# Patient Record
Sex: Male | Born: 1950
Health system: Southern US, Community
[De-identification: ages and names within clinical notes are randomized; demographics above are authoritative.]

## PROBLEM LIST (undated history)

## (undated) DIAGNOSIS — F329 Major depressive disorder, single episode, unspecified: Secondary | ICD-10-CM

## (undated) DIAGNOSIS — K589 Irritable bowel syndrome without diarrhea: Secondary | ICD-10-CM

## (undated) DIAGNOSIS — Z87442 Personal history of urinary calculi: Secondary | ICD-10-CM

## (undated) DIAGNOSIS — C4490 Unspecified malignant neoplasm of skin, unspecified: Secondary | ICD-10-CM

## (undated) DIAGNOSIS — Z950 Presence of cardiac pacemaker: Secondary | ICD-10-CM

## (undated) DIAGNOSIS — R55 Syncope and collapse: Secondary | ICD-10-CM

## (undated) DIAGNOSIS — N200 Calculus of kidney: Secondary | ICD-10-CM

## (undated) DIAGNOSIS — I10 Essential (primary) hypertension: Secondary | ICD-10-CM

## (undated) DIAGNOSIS — E119 Type 2 diabetes mellitus without complications: Secondary | ICD-10-CM

## (undated) DIAGNOSIS — K219 Gastro-esophageal reflux disease without esophagitis: Secondary | ICD-10-CM

## (undated) DIAGNOSIS — I480 Paroxysmal atrial fibrillation: Secondary | ICD-10-CM

## (undated) DIAGNOSIS — F32A Depression, unspecified: Secondary | ICD-10-CM

## (undated) DIAGNOSIS — M109 Gout, unspecified: Secondary | ICD-10-CM

## (undated) DIAGNOSIS — I442 Atrioventricular block, complete: Secondary | ICD-10-CM

## (undated) DIAGNOSIS — Z95810 Presence of automatic (implantable) cardiac defibrillator: Secondary | ICD-10-CM

## (undated) DIAGNOSIS — F419 Anxiety disorder, unspecified: Secondary | ICD-10-CM

## (undated) DIAGNOSIS — I428 Other cardiomyopathies: Secondary | ICD-10-CM

## (undated) HISTORY — DX: Gastro-esophageal reflux disease without esophagitis: K21.9

## (undated) HISTORY — PX: UPPER ENDOSCOPY W/ ESOPHAGEAL MANOMETRY: SHX2604

## (undated) HISTORY — DX: Calculus of kidney: N20.0

## (undated) HISTORY — DX: Paroxysmal atrial fibrillation: I48.0

## (undated) HISTORY — PX: CARDIAC DEFIBRILLATOR PLACEMENT: SHX171

## (undated) HISTORY — DX: Syncope and collapse: R55

## (undated) HISTORY — DX: Essential (primary) hypertension: I10

## (undated) HISTORY — DX: Major depressive disorder, single episode, unspecified: F32.9

## (undated) HISTORY — DX: Presence of automatic (implantable) cardiac defibrillator: Z95.810

## (undated) HISTORY — DX: Unspecified malignant neoplasm of skin, unspecified: C44.90

## (undated) HISTORY — DX: Depression, unspecified: F32.A

## (undated) HISTORY — DX: Other cardiomyopathies: I42.8

## (undated) HISTORY — DX: Atrioventricular block, complete: I44.2

## (undated) HISTORY — DX: Irritable bowel syndrome, unspecified: K58.9

## (undated) HISTORY — PX: LITHOTRIPSY: SUR834

## (undated) HISTORY — DX: Gout, unspecified: M10.9

## (undated) HISTORY — DX: Anxiety disorder, unspecified: F41.9

---

## 2003-03-11 ENCOUNTER — Other Ambulatory Visit: Payer: Self-pay

## 2004-09-10 ENCOUNTER — Ambulatory Visit: Payer: Self-pay | Admitting: Internal Medicine

## 2004-09-10 ENCOUNTER — Observation Stay: Payer: Self-pay | Admitting: Cardiology

## 2004-09-11 ENCOUNTER — Observation Stay (HOSPITAL_COMMUNITY): Admission: EM | Admit: 2004-09-11 | Discharge: 2004-09-12 | Payer: Self-pay | Admitting: Internal Medicine

## 2004-09-11 ENCOUNTER — Ambulatory Visit: Payer: Self-pay | Admitting: Internal Medicine

## 2004-09-28 ENCOUNTER — Ambulatory Visit: Payer: Self-pay

## 2005-01-18 ENCOUNTER — Ambulatory Visit: Payer: Self-pay | Admitting: Internal Medicine

## 2005-05-17 ENCOUNTER — Ambulatory Visit: Payer: Self-pay | Admitting: Internal Medicine

## 2005-11-16 ENCOUNTER — Ambulatory Visit: Payer: Self-pay | Admitting: Internal Medicine

## 2006-02-15 ENCOUNTER — Ambulatory Visit: Payer: Self-pay | Admitting: Internal Medicine

## 2006-03-11 ENCOUNTER — Ambulatory Visit: Payer: Self-pay

## 2006-05-17 ENCOUNTER — Ambulatory Visit: Payer: Self-pay | Admitting: Internal Medicine

## 2006-08-16 ENCOUNTER — Ambulatory Visit: Payer: Self-pay | Admitting: Internal Medicine

## 2006-11-08 ENCOUNTER — Ambulatory Visit: Payer: Self-pay | Admitting: Internal Medicine

## 2007-02-14 ENCOUNTER — Ambulatory Visit: Payer: Self-pay | Admitting: Internal Medicine

## 2007-02-16 ENCOUNTER — Ambulatory Visit: Payer: Self-pay | Admitting: Internal Medicine

## 2007-05-16 ENCOUNTER — Ambulatory Visit: Payer: Self-pay | Admitting: Internal Medicine

## 2007-08-15 ENCOUNTER — Ambulatory Visit: Payer: Self-pay | Admitting: Internal Medicine

## 2007-10-26 ENCOUNTER — Ambulatory Visit: Payer: Self-pay | Admitting: Unknown Physician Specialty

## 2007-11-20 ENCOUNTER — Ambulatory Visit: Payer: Self-pay | Admitting: Internal Medicine

## 2008-06-11 ENCOUNTER — Encounter: Payer: Self-pay | Admitting: Internal Medicine

## 2008-06-17 ENCOUNTER — Ambulatory Visit: Payer: Self-pay | Admitting: Internal Medicine

## 2008-10-22 ENCOUNTER — Ambulatory Visit: Payer: Self-pay | Admitting: Internal Medicine

## 2008-10-23 ENCOUNTER — Encounter: Payer: Self-pay | Admitting: Internal Medicine

## 2008-10-31 ENCOUNTER — Encounter: Payer: Self-pay | Admitting: Internal Medicine

## 2009-01-27 ENCOUNTER — Ambulatory Visit: Payer: Self-pay | Admitting: Internal Medicine

## 2009-01-27 ENCOUNTER — Encounter: Payer: Self-pay | Admitting: Cardiology

## 2009-01-27 DIAGNOSIS — I429 Cardiomyopathy, unspecified: Secondary | ICD-10-CM | POA: Insufficient documentation

## 2009-01-27 DIAGNOSIS — G720 Drug-induced myopathy: Secondary | ICD-10-CM | POA: Insufficient documentation

## 2009-01-27 DIAGNOSIS — IMO0001 Reserved for inherently not codable concepts without codable children: Secondary | ICD-10-CM

## 2009-01-27 DIAGNOSIS — I428 Other cardiomyopathies: Secondary | ICD-10-CM

## 2009-01-27 DIAGNOSIS — I5022 Chronic systolic (congestive) heart failure: Secondary | ICD-10-CM

## 2009-01-27 DIAGNOSIS — M791 Myalgia, unspecified site: Secondary | ICD-10-CM | POA: Insufficient documentation

## 2009-02-05 LAB — CONVERTED CEMR LAB: Sed Rate: 2 mm/hr (ref 0–16)

## 2009-04-29 ENCOUNTER — Ambulatory Visit: Payer: Self-pay | Admitting: Internal Medicine

## 2009-05-13 ENCOUNTER — Encounter: Payer: Self-pay | Admitting: Internal Medicine

## 2009-07-29 ENCOUNTER — Ambulatory Visit: Payer: Self-pay | Admitting: Internal Medicine

## 2009-08-11 ENCOUNTER — Encounter: Payer: Self-pay | Admitting: Internal Medicine

## 2009-11-04 ENCOUNTER — Other Ambulatory Visit: Payer: Self-pay | Admitting: Internal Medicine

## 2009-11-04 ENCOUNTER — Ambulatory Visit: Payer: Self-pay | Admitting: Internal Medicine

## 2009-11-26 ENCOUNTER — Encounter: Payer: Self-pay | Admitting: Internal Medicine

## 2010-02-02 ENCOUNTER — Ambulatory Visit: Payer: Self-pay | Admitting: Internal Medicine

## 2010-02-02 DIAGNOSIS — R55 Syncope and collapse: Secondary | ICD-10-CM | POA: Insufficient documentation

## 2010-02-03 ENCOUNTER — Telehealth: Payer: Self-pay | Admitting: Internal Medicine

## 2010-02-10 ENCOUNTER — Telehealth (INDEPENDENT_AMBULATORY_CARE_PROVIDER_SITE_OTHER): Payer: Self-pay | Admitting: *Deleted

## 2010-02-11 ENCOUNTER — Ambulatory Visit: Payer: Self-pay | Admitting: Cardiovascular Disease

## 2010-02-11 ENCOUNTER — Encounter: Payer: Self-pay | Admitting: Cardiovascular Disease

## 2010-02-11 ENCOUNTER — Ambulatory Visit: Payer: Self-pay

## 2010-02-11 ENCOUNTER — Encounter (HOSPITAL_COMMUNITY)
Admission: RE | Admit: 2010-02-11 | Discharge: 2010-05-09 | Payer: Self-pay | Source: Home / Self Care | Attending: Internal Medicine | Admitting: Internal Medicine

## 2010-02-23 ENCOUNTER — Ambulatory Visit: Payer: Self-pay | Admitting: Internal Medicine

## 2010-02-24 LAB — CONVERTED CEMR LAB
BUN: 12 mg/dL (ref 6–23)
CO2: 25 meq/L (ref 19–32)
Calcium: 9.1 mg/dL (ref 8.4–10.5)
Chloride: 105 meq/L (ref 96–112)
Creatinine, Ser: 1 mg/dL (ref 0.40–1.50)
Glucose, Bld: 150 mg/dL — ABNORMAL HIGH (ref 70–99)
Potassium: 3.8 meq/L (ref 3.5–5.3)
Sodium: 140 meq/L (ref 135–145)

## 2010-03-09 ENCOUNTER — Ambulatory Visit (HOSPITAL_COMMUNITY): Admission: RE | Admit: 2010-03-09 | Discharge: 2010-03-09 | Payer: Self-pay | Admitting: Internal Medicine

## 2010-03-09 ENCOUNTER — Ambulatory Visit: Payer: Self-pay | Admitting: Internal Medicine

## 2010-04-21 ENCOUNTER — Ambulatory Visit: Payer: Self-pay | Admitting: Internal Medicine

## 2010-04-21 DIAGNOSIS — I442 Atrioventricular block, complete: Secondary | ICD-10-CM | POA: Insufficient documentation

## 2010-04-22 ENCOUNTER — Encounter (INDEPENDENT_AMBULATORY_CARE_PROVIDER_SITE_OTHER): Payer: Self-pay | Admitting: *Deleted

## 2010-04-23 ENCOUNTER — Encounter: Payer: Self-pay | Admitting: Internal Medicine

## 2010-04-27 ENCOUNTER — Telehealth: Payer: Self-pay | Admitting: Internal Medicine

## 2010-05-06 ENCOUNTER — Encounter (INDEPENDENT_AMBULATORY_CARE_PROVIDER_SITE_OTHER): Payer: Self-pay | Admitting: *Deleted

## 2010-05-06 ENCOUNTER — Telehealth: Payer: Self-pay | Admitting: Internal Medicine

## 2010-05-07 ENCOUNTER — Encounter: Payer: Self-pay | Admitting: Internal Medicine

## 2010-05-14 ENCOUNTER — Encounter: Payer: Self-pay | Admitting: Internal Medicine

## 2010-05-14 ENCOUNTER — Ambulatory Visit
Admission: RE | Admit: 2010-05-14 | Discharge: 2010-05-14 | Payer: Self-pay | Source: Home / Self Care | Attending: Internal Medicine | Admitting: Internal Medicine

## 2010-05-18 LAB — CONVERTED CEMR LAB
BUN: 18 mg/dL (ref 6–23)
Basophils Absolute: 0.1 10*3/uL (ref 0.0–0.1)
Basophils Relative: 1 % (ref 0–1)
CO2: 26 meq/L (ref 19–32)
Calcium: 9.4 mg/dL (ref 8.4–10.5)
Chloride: 105 meq/L (ref 96–112)
Creatinine, Ser: 1.02 mg/dL (ref 0.40–1.50)
Eosinophils Absolute: 0.2 10*3/uL (ref 0.0–0.7)
Eosinophils Relative: 2 % (ref 0–5)
Glucose, Bld: 105 mg/dL — ABNORMAL HIGH (ref 70–99)
HCT: 40.7 % (ref 39.0–52.0)
Hemoglobin: 14.6 g/dL (ref 13.0–17.0)
INR: 1.07 (ref <1.50)
Lymphocytes Relative: 33 % (ref 12–46)
Lymphs Abs: 2.5 10*3/uL (ref 0.7–4.0)
MCHC: 35.9 g/dL (ref 30.0–36.0)
MCV: 87.2 fL (ref 78.0–100.0)
Monocytes Absolute: 0.8 10*3/uL (ref 0.1–1.0)
Monocytes Relative: 11 % (ref 3–12)
Neutro Abs: 4.1 10*3/uL (ref 1.7–7.7)
Neutrophils Relative %: 54 % (ref 43–77)
Platelets: 208 10*3/uL (ref 150–400)
Potassium: 4.7 meq/L (ref 3.5–5.3)
Prothrombin Time: 14.1 s (ref 11.6–15.2)
RBC: 4.67 M/uL (ref 4.22–5.81)
RDW: 13.4 % (ref 11.5–15.5)
Sodium: 143 meq/L (ref 135–145)
WBC: 7.6 10*3/uL (ref 4.0–10.5)
aPTT: 31 s

## 2010-05-20 ENCOUNTER — Ambulatory Visit (HOSPITAL_COMMUNITY)
Admission: RE | Admit: 2010-05-20 | Discharge: 2010-05-21 | Payer: Self-pay | Source: Home / Self Care | Attending: Internal Medicine | Admitting: Internal Medicine

## 2010-05-21 ENCOUNTER — Encounter: Payer: Self-pay | Admitting: Internal Medicine

## 2010-05-25 LAB — GLUCOSE, CAPILLARY
Glucose-Capillary: 121 mg/dL — ABNORMAL HIGH (ref 70–99)
Glucose-Capillary: 125 mg/dL — ABNORMAL HIGH (ref 70–99)
Glucose-Capillary: 135 mg/dL — ABNORMAL HIGH (ref 70–99)
Glucose-Capillary: 122 mg/dL — ABNORMAL HIGH (ref 70–99)
Glucose-Capillary: 149 mg/dL — ABNORMAL HIGH (ref 70–99)

## 2010-05-25 LAB — SURGICAL PCR SCREEN
MRSA, PCR: NEGATIVE
Staphylococcus aureus: NEGATIVE

## 2010-05-26 ENCOUNTER — Telehealth: Payer: Self-pay | Admitting: Internal Medicine

## 2010-05-28 NOTE — Op Note (Signed)
NAMESELVIN, Ryan [name]                 ACCOUNT NO.:  [id number]  MEDICAL RECORD NO.:  [id number]          PATIENT TYPE:  OIB  LOCATION:  6531                         FACILITY:  [facility]  PHYSICIAN:  Duke Salvia, MD, FACCDATE OF BIRTH:  22-Apr-1951  DATE OF PROCEDURE:  05/20/2010 DATE OF DISCHARGE:                              OPERATIVE REPORT   PREOPERATIVE DIAGNOSIS:  Complete heart block 6949 lead previously implanted device now nearing elective replacement indicator.  POSTOPERATIVE DIAGNOSIS:  Complete heart block 6949 lead previously implanted device now nearing elective replacement indicator.  PROCEDURE:  Explantation of the previously implanted device, insertion of a new lead, pocket revision and intraoperative defibrillation threshold testing.  Following obtained informed consent and previously obtained electrogram demonstrated patency of the extrathoracic left subclavian vein. Lidocaine was infiltrated just caudal to the prior incision carried down to layer of the prepectoral fascia using sharp dissection electrocautery.  At this point, venous access was obtained without difficulty and 9-French sheath was placed which was passed to the Medtronic 6947 dual coil defibrillator lead, serial number [id number]. Under fluoroscopic guidance, it was moved to the right ventricular apex where the distal rate sense portion was apical to the previously implanted lead.  In this location the bipolar R-wave that was paced was 10-12 mV with a pace impedance of 622 with threshold of 0.6 at 0.5. Current threshold 0.8 mA.  There was no diaphragmatic pacing at 10 volts and current of injury was adequate.  This lead was secured to the prepectoral fascia.  At this point the previously implanted defibrillator was freed up.  Interrogation of the previously implanted atrial leads model 5076PG [id number] demonstrated an amplitude of 1.5 with a pace impedance of 420, threshold of 1 volt at 0.5 and with  these acceptable parameters recorded, the leads were attached to Medtronic D314TRG device, serial number [id number].  The previously implanted defibrillator lead was capped and placed posteriorly.  Interrogation of the leads through the new device demonstrated amplitude of 1.2 with pace impedance of 3, threshold 0.75 at 0.4 mA and a paced rhythm with impedance of 532 and threshold of 0.5 at 0.4 in the V.  High-voltage impedances were 50/60.  Defibrillation threshold testing was undertaken.  After total duration of about 10 seconds, a 15- joule shock was delivered through a measured resistance of 50 ohms terminating induced ventricular fibrillation and restoring sinus rhythm.  The patient tolerated the procedure without apparent complication.  The pocket was copiously irrigated with antibiotic containing saline solution.  Hemostasis was assured.  The pocket was revised cephalad to allow for housing of the larger device. I SHOULD NOTE THAT THE DEVICE IMPLANT WAS A CRT DEVICE WITH THE LV PORT PLUGGED.  The wound was then closed in three layers in normal fashion following irrigation with antibiotic-containing saline solution and hemostasis have been obtained and Surgicel having been placed at the cephalad aspect of the pocket.  The wound was closed in three layers in normal fashion.  The wound was washed, dried, and benzoin Steri-Strip dressing was applied.  Needle counts, sponge counts and instrument counts were correct at the  end of procedure according to staff.  The patient tolerated the procedure without apparent complication.     Duke Salvia, MD, [facility]     SCK/MEDQ  D:  05/20/2010  T:  05/21/2010  Job:  [id number]  Electronically Signed by Sherryl Manges MD [facility] on 05/28/2010 01:42:04 PM

## 2010-05-29 ENCOUNTER — Encounter: Payer: Self-pay | Admitting: Internal Medicine

## 2010-05-29 ENCOUNTER — Ambulatory Visit
Admission: RE | Admit: 2010-05-29 | Discharge: 2010-05-29 | Payer: Self-pay | Source: Home / Self Care | Attending: Internal Medicine | Admitting: Internal Medicine

## 2010-06-09 NOTE — Progress Notes (Signed)
Summary: Venogram  Phone Note Outgoing Call   Call placed by: Benedict Needy, RN,  February 03, 2010 11:04 AM Call placed to: Patient Summary of Call: Called pt to inform him of his Venogram appt 10/21 pt needs to arrive at 2pm for appt at4pm. Needs to be NPO and have BMP within 7 days.  Unable to do myoviews on fridays and unsure if the pt would want to be NPO all day.  Initial call taken by: Benedict Needy, RN,  February 03, 2010 11:06 AM  Follow-up for Phone Call        Pt would like to have stress test a different day than the venogram. Venogram scheduled for 10/21 at 4pm @ [facility]. Benedict Needy, RN  February 03, 2010 3:33 PM   Pt's myoview scheduled for 10/5 at 9am. [facility] TCB Benedict Needy, RN  February 05, 2010 9:37 AM  Pt is aware of his appts for venogram and stress.   Follow-up by: Benedict Needy, RN,  February 05, 2010 3:23 PM  Additional Follow-up for Phone Call Additional follow up Details #1::        does not ned to be npo after stress for venogram

## 2010-06-09 NOTE — Procedures (Signed)
Summary: Cardiology Device Clinic   Allergies: No Known Drug Allergies   ICD Specifications Following MD:  Sherryl Manges, MD     ICD Vendor:  Medtronic     ICD Model Number:  D154ATG     ICD Serial Number:  [id number] ICD DOI:  09/11/2004     ICD Implanting MD:  Sherryl Manges, MD  Lead 1:    Location: RA     DOI: 09/11/2004     Model #: 4098     Serial #: [id number]     Status: active Lead 2:    Location: RV     DOI: 09/11/2004     Model #: 6213     Serial #: [id number]     Status: active  Indications::  NICM, LBBB, SYNCOPE   ICD Follow Up Remote Check?  No Battery Voltage:  2.68 V     Charge Time:  10.07 seconds     Underlying rhythm:  dependent ICD Dependent:  Yes       ICD Device Measurements Atrium:  Amplitude: 1 mV, Impedance: 552 ohms, Threshold: 1.0 V at 0.4 msec Right Ventricle:  Impedance: 664 ohms, Threshold: 1.0 V at 0.4 msec  Episodes Coumadin:  No Shock:  0     ATP:  0     Nonsustained:  0      Brady Parameters Mode DDDR     Lower Rate Limit:  60     Upper Rate Limit 140 PAV 180     Sensed AV Delay:  150  Tachy Zones VF:  200     VT:  240     VT1:  158     Tech Comments:  Checked by industry.  6949 lead stable, SIC  0.  To be scheduled for lead revision. Altha Harm, LPN  February 04, 2010 4:05 PM

## 2010-06-09 NOTE — Letter (Signed)
Summary: Remote Device Check  [employer], Main Office  [address]   [place], [place] [address]   Phone: [phone]  Fax: [phone]     November 26, 2009 [name]: [id number]   [facility] [address] [place], [place]  [address]   Dear Mr. SOLEDAD,   Your remote transmission was recieved and reviewed by your physician.  All diagnostics were within normal limits for you.   ___X___Your next office visit is scheduled for:  02-05-2010 @ 930am. Please call our office to schedule an appointment.    Sincerely,  Vella Kohler

## 2010-06-09 NOTE — Assessment & Plan Note (Signed)
Summary: Cardiology Nuclear Testing  Nuclear Med Background Indications for Stress Test: Evaluation for Ischemia   History: Defibrillator  History Comments: NICM,PAF  Symptoms: Light-Headedness, Nausea, Rapid HR    Nuclear Pre-Procedure Cardiac Risk Factors: LBBB Caffeine/Decaff Intake: None NPO After: 7:00 PM Lungs: clear IV 0.9% NS with Angio Cath: 22g     IV Site: R Forearm IV Started by: Irean Hong, RN Chest Size (in) 44     Height (in): 73.5 Weight (lb): 220 BMI: 28.74 Tech Comments: Held coreg x 24 hrs.  Nuclear Med Study 1 or 2 day study:  1 day     Stress Test Type:  Adenosine Reading MD:  Kristeen Miss, MD     Referring MD:  S.Klein Resting Radionuclide:  Technetium 47m Tetrofosmin     Resting Radionuclide Dose:  10.9 mCi  Stress Radionuclide:  Technetium 21m Tetrofosmin     Stress Radionuclide Dose:  32.9 mCi   Stress Protocol  Dose of Adenosine:  56.0 mg    Stress Test Technologist:  Milana Na, EMT-P     Nuclear Technologist:  Doyne Keel, CNMT  Rest Procedure  Myocardial perfusion imaging was performed at rest 45 minutes following the intravenous administration of Technetium 74m Tetrofosmin.  Stress Procedure  The patient received IV adenosine at 140 mcg/kg/min for 4 minutes. There were no significant changes with infusion. Technetium 30m Tetrofosmin was injected at the 2 minute mark and quantitative spect images were obtained after a 45 minute delay.  QPS Raw Data Images:  Normal; no motion artifact; normal heart/lung ratio. Stress Images:  There is a large severe defect of the  inferior apex and entire inferior Archey.  The uptake in the remaining walls is fairly normal. Rest Images:  There is a large severe defect of the  inferior apex and entire inferior Durkin.  The uptake in the remaining walls is fairly normal. Subtraction (SDS):  There is a fixed inferior defect that is most consistent with a previous infarction of the entire inferior Priola and  inferior apex. Transient Ischemic Dilatation:  1.05  (Normal <1.22)  Lung/Heart Ratio:  .33  (Normal <0.45)  Quantitative Gated Spect Images QGS EDV:  166 ml QGS ESV:  99 ml QGS EF:  40 % QGS cine images:  The overall LV function is mildly-moderately depressed.  There is akinesis/ dyskinesis of the inferior apex and the mid/distal inferior Teem.  Findings Low risk nuclear study      Overall Impression  Exercise Capacity: Adenosine with no exercise. BP Response: Normal blood pressure response. Clinical Symptoms: MIld dyspnea ECG Impression: LBBB at baseline.  There were no ECG changes to suggest ischemia. Overall Impression: Low risk stress nuclear study. Overall Impression Comments:  There is evidence of a previous large inferior MI.  There is no evidence of ischemia.  The LV function is mildly-moderately depressed with and EF of 40%.  There is akinesis/dyskinesis of the mid and distal / apical inferior Hochmuth.  Appended Document: Cardiology Nuclear Testing Please advise what to tell pt. Preliminarily reviewed. Forwarded to MD desktop for review and signature   Appended Document: Cardiology Nuclear Testing pt aware of results.

## 2010-06-09 NOTE — Assessment & Plan Note (Signed)
Summary: F1Y/AMD   Visit Type:  Follow-up Primary Provider:  Marlou Starks, MD  CC:  c/o having a spell Sept. 1 and 2011 at 8:30 with rapid heart beats and nausea..  History of Present Illness:  Ryan [name] is here in followup for an ICD implanted for syncope in the setting of a nonischemic cardiomyopathy.  He is now device dependent. He has 6949 lead in place  He has no complaints of chest pain or shortness of breath. Excise tolerance remains quite good.  He had  a spell of lightheadedness in early Sept? afib like  Current Medications (verified): 1)  Carvedilol 25 Mg Tabs (Carvedilol) .... Take One Tablet By Mouth Twice A Day 2)  Allopurinol 300 Mg Tabs (Allopurinol) .Marland Kitchen.. 1 By Mouth Once Daily 3)  Nexium 40 Mg Cpdr (Esomeprazole Magnesium) .Marland Kitchen.. 1 By Mouth Once Daily 4)  Micardis 80 Mg Tabs (Telmisartan) .Marland Kitchen.. 1 By Mouth Once Daily 5)  Amlodipine Besylate 5 Mg Tabs (Amlodipine Besylate) .... Take One Tablet By Mouth Daily 6)  Aspirin 81 Mg Tbec (Aspirin) .... Take One Tablet By Mouth Daily 7)  L-Lysine Hcl 500 Mg Tabs (Lysine Hcl) .Marland Kitchen.. 1 By Mouth Once Daily 8)  Pravastatin Sodium 20 Mg Tabs (Pravastatin Sodium) .... One Tablet At Bedtime  Allergies (verified): No Known Drug Allergies  Past History:  Past Medical History: Last updated: 02/24/09  1. Nonischemic cardiomyopathy.   2. History of syncope.   3. Status post implantable cardioverter-defibrillator for the above.   4. Recurrent development of complete heart block.   5. 6949 lead.   6. Paroxysmal atrial fibrillation - short runs.   7. Thromboembolic risk factors notable for hypertension, left       ventricular dysfunction, and question diabetes.   Family History: Last updated: 2009/02/24  Mother died of cancer, she had diabetes. Father died of  cancer, he had diabetes. He has two brothers and one sister. No coronary  artery disease.  Social History: Last updated: 02/24/09  The patient lives in [place] with his  wife. 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 No tobacco products. He does drink alcohol modestly (3 beers a week).  Vital Signs:  Patient profile:   60 year old male Height:      73.5 inches Weight:      225 pounds BMI:     29.39 Pulse rate:   71 / minute BP sitting:   169 / 101  (left arm) Cuff size:   regular  Vitals Entered By: Bishop Dublin, CMA (February 02, 2010 9:29 AM)  Physical Exam  General:  The patient was alert and oriented in no acute distress. HEENT Normal.  Neck veins were flat, carotids were brisk.  Lungs were clear.  Heart sounds were regular without murmurs or gallops.  Abdomen was soft with active bowel sounds. There is no clubbing cyanosis or edema. Skin Warm and dry Chest Duva with out collaterals-superficial    ICD Specifications Following MD:  Sherryl Manges, MD     ICD Vendor:  Medtronic     ICD Model Number:  D154ATG     ICD Serial Number:  [id number] ICD DOI:  09/11/2004     ICD Implanting MD:  Sherryl Manges, MD  Lead 1:    Location: RA     DOI: 09/11/2004     Model #: 8657     Serial #: [id number]     Status: active Lead 2:    Location: RV  DOI: 09/11/2004     Model #: 4196     Serial #: [id number]     Status: active  Indications::  NICM, LBBB, SYNCOPE   Episodes Coumadin:  No  Brady Parameters Mode DDDR     Lower Rate Limit:  60     Upper Rate Limit 140 PAV 180     Sensed AV Delay:  150  Tachy Zones VF:  200     VT:  240     VT1:  158     Impression & Recommendations:  Problem # 1:  6949 LEAD (ICD-996.04) With device dependence  and the risk of lead fracture, would plan lead replacemnt.  If the ipsilateral vein is open, and iwll need clarfication by venography, will add a lead, o/w wil refer to dr GT for extraction and replacement,  Have reviewed risks and benefits including byt not limited to death adn infection.  In antipication will undertake stress myoview scanning  Problem # 2:  IMPLANTABLE  DEFIBRILLATOR MDT  DDD (ICD-V45.02) Device parameters and data were reviewed and no changes were made  Problem # 3:  CARDIOMYOPATHY, PRIMARY, DILATED (ICD-425.4) stable on current meds; may be a candidate for aldosterone antagonism will await results of EF His updated medication list for this problem includes:    Carvedilol 25 Mg Tabs (Carvedilol) .Marland Kitchen... Take one tablet by mouth twice a day    Micardis 80 Mg Tabs (Telmisartan) .Marland Kitchen... 1 by mouth once daily    Amlodipine Besylate 5 Mg Tabs (Amlodipine besylate) .Marland Kitchen... Take one tablet by mouth daily    Aspirin 81 Mg Tbec (Aspirin) .Marland Kitchen... Take one tablet by mouth daily  Problem # 4:  SYSTOLIC HEART FAILURE, CHRONIC (ICD-428.22) stable His updated medication list for this problem includes:    Carvedilol 25 Mg Tabs (Carvedilol) .Marland Kitchen... Take one tablet by mouth twice a day    Micardis 80 Mg Tabs (Telmisartan) .Marland Kitchen... 1 by mouth once daily    Amlodipine Besylate 5 Mg Tabs (Amlodipine besylate) .Marland Kitchen... Take one tablet by mouth daily    Aspirin 81 Mg Tbec (Aspirin) .Marland Kitchen... Take one tablet by mouth daily  Other Orders: Treadmill (Treadmill)  Appended Document: Orders Update    Clinical Lists Changes  Orders: Added new Referral order of Nuclear Stress Test (Nuc Stress Test) - Signed

## 2010-06-09 NOTE — Cardiovascular Report (Signed)
Summary: Office Visit Remote   Office Visit Remote   Imported By: Roderic Ovens 11/27/2009 11:03:43  _____________________________________________________________________  External Attachment:    Type:   Image     Comment:   External Document

## 2010-06-09 NOTE — Cardiovascular Report (Signed)
Summary: [facility] ICD Follow-Up Summary    ICD Follow-Up Summary   Imported By: Roderic Ovens 03/17/2010 13:09:49  _____________________________________________________________________  External Attachment:    Type:   Image     Comment:   External Document

## 2010-06-09 NOTE — Letter (Signed)
Summary: Remote Device Check  [employer], Main Office  [address]   [place], [place] [address]   Phone: [phone]  Fax: [phone]     May 13, 2009 [name]: [id number]   [facility] [address] [place], [place]  [address]   Dear Mr. THIELEN,   Your remote transmission was recieved and reviewed by your physician.  All diagnostics were within normal limits for you.  __X___Your next transmission is scheduled for:  July 29, 2009.  Please transmit at any time this day.  If you have a wireless device your transmission will be sent automatically.      Sincerely,  Proofreader

## 2010-06-09 NOTE — Letter (Signed)
Summary: Remote Device Check  [employer], Main Office  [address]   [place], [place] [address]   Phone: [phone]  Fax: [phone]     August 11, 2009 [name]: [id number]   [facility] [address] [place], [place]  [address]   Dear Mr. DELFAVERO,   Your remote transmission was recieved and reviewed by your physician.  All diagnostics were within normal limits for you.  __X___Your next transmission is scheduled for:   November 04, 2009.  Please transmit at any time this day.  If you have a wireless device your transmission will be sent automatically.      Sincerely,  Proofreader

## 2010-06-09 NOTE — Cardiovascular Report (Signed)
Summary: Office Visit Remote   Office Visit Remote   Imported By: Roderic Ovens 05/15/2009 12:42:34  _____________________________________________________________________  External Attachment:    Type:   Image     Comment:   External Document

## 2010-06-09 NOTE — Progress Notes (Signed)
Summary: nuc pre procedure  Phone Note Outgoing Call Call back at Home Phone [phone]   Call placed by: Cathlyn Parsons RN,  February 10, 2010 1:38 PM Call placed to: Patient Reason for Call: Confirm/change Appt Summary of Call: .nucmess     Nuclear Med Background Indications for Stress Test: Evaluation for Ischemia   History: Defibrillator  History Comments: NICM,PAF  Symptoms: Light-Headedness, Nausea, Rapid HR    Nuclear Pre-Procedure Cardiac Risk Factors: LBBB Height (in): 73.5

## 2010-06-09 NOTE — Cardiovascular Report (Signed)
Summary: Office Visit Remote   Office Visit Remote   Imported By: Roderic Ovens 08/15/2009 14:51:48  _____________________________________________________________________  External Attachment:    Type:   Image     Comment:   External Document

## 2010-06-11 NOTE — Progress Notes (Signed)
Summary: Activity Restriction  Phone Note Call from Patient Call back at Home Phone [phone]   Caller: Wife  Call For: Ryan [name] Summary of Call: Pt is confused about dates of restriction of activities. Initial call taken by: Harlon Flor,  May 26, 2010 12:33 PM  Follow-up for Phone Call        Notified patient to follow instructions that were given when left the hospital.  Told him not to do too much until after his wound check.  He read off all instructions to me from the hospital discharge.  Told him to just make sure he follows those instructions. Follow-up by: Bishop Dublin, CMA,  May 26, 2010 5:00 PM

## 2010-06-11 NOTE — Procedures (Signed)
Summary: 10 day wound ck/mt   Current Medications (verified): 1)  Carvedilol 25 Mg Tabs (Carvedilol) .... Take One Tablet By Mouth Twice A Day 2)  Allopurinol 300 Mg Tabs (Allopurinol) .Marland Kitchen.. 1 By Mouth Once Daily 3)  Nexium 40 Mg Cpdr (Esomeprazole Magnesium) .Marland Kitchen.. 1 By Mouth Once Daily 4)  Micardis 80 Mg Tabs (Telmisartan) .Marland Kitchen.. 1 By Mouth Once Daily 5)  Aspirin 81 Mg Tbec (Aspirin) .... Take One Tablet By Mouth Daily 6)  Pravastatin Sodium 20 Mg Tabs (Pravastatin Sodium) .... One Tablet At Bedtime 7)  Verelan 240 Mg Xr24h-Cap (Verapamil Hcl) .... One By Mouth Daily 8)  Vitamin D3 50000 Unit Caps (Cholecalciferol) .... One By Mouth Weekly  Allergies (verified): No Known Drug Allergies    ICD Specifications Following MD:  Sherryl Manges, MD     ICD Vendor:  Medtronic     ICD Model Number:  [id number]     ICD Serial Number:  [id number] ICD DOI:  05/20/2010     ICD Implanting MD:  Sherryl Manges, MD  Lead 1:    Location: RA     DOI: 09/11/2004     Model #: 0981     Serial #: [id number]     Status: active Lead 2:    Location: RV     DOI: 09/11/2004     Model #: 2130     Serial #: [id number]     Status: capped Lead 3:    Location: RV     DOI: 05/20/2010     Model #: 2952     Serial #: [id number]     Status: active  Indications::  NICM, LBBB, SYNCOPE  Explantation Comments: 05/20/10 Medtronic [id number] H explanted  ICD Follow Up Remote Check?  No Battery Voltage:  3.22 V     Charge Time:  8.2 seconds     Underlying rhythm:  dependent ICD Dependent:  Yes       ICD Device Measurements Atrium:  Amplitude: 0.4 mV, Impedance: 399 ohms, Threshold: 0.5 V at 0.4 msec Right Ventricle:  Impedance: 589 ohms, Threshold: 0.5 V at 0.4 msec Shock Impedance: 46/60 ohms   Episodes MS Episodes:  0     Percent Mode Switch:  0     Coumadin:  No Shock:  0     ATP:  0     Nonsustained:  0     Atrial Pacing:  41.2%     Ventricular Pacing:  100%  Brady Parameters Mode DDDR     Lower Rate Limit:  60      Upper Rate Limit 140 PAV 180     Sensed AV Delay:  150  Tachy Zones VF:  200     VT:  240     VT1:  158     Next Cardiology Appt Due:  08/09/2010 Tech Comments:  Lead impedance alert values reporgrammed.  Steri strips removed, no redness or edema noted.  Device function normal.  ROV 3 months with Dr. Graciela Husbands in [place]. Altha Harm, LPN  May 29, 2010 2:19 PM

## 2010-06-11 NOTE — Progress Notes (Signed)
  Phone Note Outgoing Call   Call placed by: rhonda Call placed to: Patient Details for Reason: reschedule surgery time Summary of Call: spoke w/pt and adv that new surgery time is 1:30 and that I will send a revised letter to him. Pt expressed understanding.  Initial call taken by: Claris Gladden RN,  May 06, 2010 12:23 PM

## 2010-06-11 NOTE — Cardiovascular Report (Signed)
Summary: Office Visit   Office Visit   Imported By: Roderic Ovens 04/23/2010 11:48:05  _____________________________________________________________________  External Attachment:    Type:   Image     Comment:   External Document

## 2010-06-11 NOTE — Assessment & Plan Note (Signed)
Summary: eph/ gd   Visit Type:  Consult Primary Alejandro Gamel:  Marlou Starks, MD  CC:  no complaints.  History of Present Illness:  Ryan [name] is here in followup for an ICD implanted for syncope in the setting of a nonischemic cardiomyopathy.  He is now device dependent. He has 6949 lead in place  He has no complaints of chest pain or shortness of breath. Excise tolerance remains quite good.  He had  a spell of lightheadedness in early Sept? afib like  Problems Prior to Update: 1)  Syncope  (ICD-780.2) 2)  Myalgia  (ICD-729.1) 3)  Cardiomyopathy, Primary, Dilated  (ICD-425.4) 4)  Implantable Defibrillator Mdt Ddd  (ICD-V45.02) 5)  6949 Lead  (ICD-996.04) 6)  Systolic Heart Failure, Chronic  (ICD-428.22)  Current Problems (verified): 1)  Syncope  (ICD-780.2) 2)  Myalgia  (ICD-729.1) 3)  Cardiomyopathy, Primary, Dilated  (ICD-425.4) 4)  Implantable Defibrillator Mdt Ddd  (ICD-V45.02) 5)  6949 Lead  (ICD-996.04) 6)  Systolic Heart Failure, Chronic  (ICD-428.22)  Current Medications (verified): 1)  Carvedilol 25 Mg Tabs (Carvedilol) .... Take One Tablet By Mouth Twice A Day 2)  Allopurinol 300 Mg Tabs (Allopurinol) .Marland Kitchen.. 1 By Mouth Once Daily 3)  Nexium 40 Mg Cpdr (Esomeprazole Magnesium) .Marland Kitchen.. 1 By Mouth Once Daily 4)  Micardis 80 Mg Tabs (Telmisartan) .Marland Kitchen.. 1 By Mouth Once Daily 5)  Amlodipine Besylate 5 Mg Tabs (Amlodipine Besylate) .... Take One Tablet By Mouth Daily 6)  Aspirin 81 Mg Tbec (Aspirin) .... Take One Tablet By Mouth Daily 7)  Pravastatin Sodium 20 Mg Tabs (Pravastatin Sodium) .... One Tablet At Bedtime  Allergies (verified): No Known Drug Allergies  Past History:  Past Medical History: Last updated: 02/04/09  1. Nonischemic cardiomyopathy.   2. History of syncope.   3. Status post implantable cardioverter-defibrillator for the above.   4. Recurrent development of complete heart block.   5. 6949 lead.   6. Paroxysmal atrial fibrillation - short runs.   7.  Thromboembolic risk factors notable for hypertension, left       ventricular dysfunction, and question diabetes.   Family History: Last updated: Feb 04, 2009  Mother died of cancer, she had diabetes. Father died of  cancer, he had diabetes. He has two brothers and one sister. No coronary  artery disease.  Social History: Last updated: 04-Feb-2009  The patient lives in [place] with his wife. 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 No tobacco products. He does drink alcohol modestly (3 beers a week).  Vital Signs:  Patient profile:   60 year old male Height:      73.5 inches Weight:      222.50 pounds BMI:     29.06 Pulse rate:   68 / minute BP sitting:   159 / 94  (left arm) Cuff size:   regular  Vitals Entered By: Caralee Ates CMA (April 21, 2010 4:56 PM)  Physical Exam  General:  The patient was alert and oriented in no acute distress. HEENT Normal.  Neck veins were flat, carotids were brisk.  Lungs were clear.  Heart sounds were regular without murmurs or gallops.  Abdomen was soft with active bowel sounds. There is no clubbing cyanosis or edema. Skin Warm and dry     ICD Specifications Following MD:  Sherryl Manges, MD     ICD Vendor:  Medtronic     ICD Model Number:  D154ATG     ICD Serial Number:  [id number] H ICD  DOI:  09/11/2004     ICD Implanting MD:  Sherryl Manges, MD  Lead 1:    Location: RA     DOI: 09/11/2004     Model #: 1610     Serial #: [id number]     Status: active Lead 2:    Location: RV     DOI: 09/11/2004     Model #: 1914     Serial #: [id number]     Status: active  Indications::  NICM, LBBB, SYNCOPE   ICD Follow Up ICD Dependent:  Yes      Episodes Coumadin:  No  Brady Parameters Mode DDDR     Lower Rate Limit:  60     Upper Rate Limit 140 PAV 180     Sensed AV Delay:  150  Tachy Zones VF:  200     VT:  240     VT1:  158     Impression & Recommendations:  Problem # 1:  IMPLANTABLE  DEFIBRILLATOR MDT DDD  (ICD-V45.02) Device parameters and data were reviewed and no changes were made  device is approaching ERI. He is however device dependent. With a 6949-lead and the risk of fracture, we have decided to pursue generator replacement and lead revision sooner rather than later. He is aware of the potential risks including but not limited to infection. Because his device dependence, we would anticipate using antimicrobial patch.  Problem # 2:  [id number] LEAD (ICD-996.04) as above  Problem # 3:  SYSTOLIC HEART FAILURE, CHRONIC (ICD-428.22) stable on current meds His updated medication list for this problem includes:    Carvedilol 25 Mg Tabs (Carvedilol) .Marland Kitchen... Take one tablet by mouth twice a day    Micardis 80 Mg Tabs (Telmisartan) .Marland Kitchen... 1 by mouth once daily    Amlodipine Besylate 5 Mg Tabs (Amlodipine besylate) .Marland Kitchen... Take one tablet by mouth daily    Aspirin 81 Mg Tbec (Aspirin) .Marland Kitchen... Take one tablet by mouth daily  Problem # 4:  CARDIOMYOPATHY, PRIMARY, DILATED (ICD-425.4) stable on current meds His updated medication list for this problem includes:    Carvedilol 25 Mg Tabs (Carvedilol) .Marland Kitchen... Take one tablet by mouth twice a day    Micardis 80 Mg Tabs (Telmisartan) .Marland Kitchen... 1 by mouth once daily    Amlodipine Besylate 5 Mg Tabs (Amlodipine besylate) .Marland Kitchen... Take one tablet by mouth daily    Aspirin 81 Mg Tbec (Aspirin) .Marland Kitchen... Take one tablet by mouth daily  Problem # 5:  AV BLOCK, COMPLETE (ICD-426.0) C. the above His updated medication list for this problem includes:    Carvedilol 25 Mg Tabs (Carvedilol) .Marland Kitchen... Take one tablet by mouth twice a day    Amlodipine Besylate 5 Mg Tabs (Amlodipine besylate) .Marland Kitchen... Take one tablet by mouth daily    Aspirin 81 Mg Tbec (Aspirin) .Marland Kitchen... Take one tablet by mouth daily

## 2010-06-11 NOTE — Letter (Signed)
Summary: Implantable Device Instructions  Architectural technologist, Main Office  [address]   [place], [place] [address]   Phone: [phone]  Fax: [phone]      Implantable Device Instructions  You are scheduled for:  __x___ Generator Change and Lead Revision  on May 20, 2010 at 11:00 am with Dr. Graciela Husbands.  1.  Please arrive at the Short Stay Center at [facility] at 9:00 am on the day of your procedure.  2.  Do not eat after midnight. You may have clear liquids until 4:00 am the morning of your procedure. Clear liquids include water, Sprite, Ginger Ale, cranberry/grape/apple juice, tea (no sugar), broth, and popsicles from clear juices.    3.  Complete lab work on May 14, 2010 10:00 in [place].  4.  Take morning medications with a few sips of water.   5.  Plan for an overnight stay.  Bring your insurance cards and a list of your medications.  6.  Wash your chest and neck with antibacterial soap (any brand) the evening before and the morning of your procedure.  Rinse well.   *If you have ANY questions after you get home, please call the office [phone].  Claris Gladden, RN  *Every attempt is made to prevent procedures from being rescheduled.  Due to the nauture of Electrophysiology, rescheduling can happen.  The physician is always aware and directs the staff when this occurs.

## 2010-06-11 NOTE — Progress Notes (Signed)
  Phone Note Call from Patient   Caller: Patient Summary of Call: Was seen by Dr. Graciela Husbands last wed.  The patient has several concerns regarding surgery for generator change and lead placement.  Would like to know who is going to do the procedure on May 20, 2009 because he received a letter in the mail stating Dr. Graciela Husbands would be doing procedure and he understood at the o.v. by Dr. Graciela Husbands that another physician would be doing the procedure.  He just wants to make sure he has all the information that is needed. [phone] or [phone] Initial call taken by: Bishop Dublin, CMA,  April 27, 2010 11:57 AM  Follow-up for Phone Call        pt not at work number and rna at home. 04/27/10 1613 spoke w/pt and he rqstd records to be sent to PCP-Dr. Dossie Arbour which I will do in am.  The main thing is back on 9/26 visit the comments under problem 1 lead-reflected the possibility of Dr. Ladona Ridgel doing the surgery.  Apparently a venogram and stress test have been done since and Dr. Graciela Husbands will do the surgery based on those results.   Will confirm that this is all correct and that nothing was overlooked. Adv pt that will f/u within next 5 business days.  Follow-up by: Claris Gladden RN,  April 27, 2010 6:35 PM     Appended Document:  ROI Signed, Faxed all Records over to [facility] @ [phone]

## 2010-06-11 NOTE — Miscellaneous (Signed)
Summary: Device change out  Clinical Lists Changes  Observations: Added new observation of ICDLEADSTAT3: active (05/21/2010 12:52) Added new observation of ICDLEADSER3: [id number] (05/21/2010 12:52) Added new observation of ICDLEADMOD3: 6947  (05/21/2010 12:52) Added new observation of ICDLEADDOI3: 05/20/2010  (05/21/2010 12:52) Added new observation of ICDLEADLOC3: RV  (05/21/2010 12:52) Added new observation of ICDLEADSTAT2: capped  (05/21/2010 12:52) Added new observation of ICD IMPL DTE: 05/20/2010  (05/21/2010 12:52) Added new observation of ICD SERL#: [id number]  (05/21/2010 12:52) Added new observation of ICD MODL#: D314TRG  (05/21/2010 12:52) Added new observation of ICDEXPLCOMM: 05/20/10 Medtronic [id number]/[id number] H explanted  (05/21/2010 12:52)       ICD Specifications Following MD:  Sherryl Manges, MD     ICD Vendor:  Medtronic     ICD Model Number:  D314TRG     ICD Serial Number:  [id number] ICD DOI:  05/20/2010     ICD Implanting MD:  Sherryl Manges, MD  Lead 1:    Location: RA     DOI: 09/11/2004     Model #: 6578     Serial #: [id number]     Status: active Lead 2:    Location: RV     DOI: 09/11/2004     Model #: 1324     Serial #: [id number]     Status: capped Lead 3:    Location: RV     DOI: 05/20/2010     Model #: 6644     Serial #: [id number]     Status: active  Indications::  NICM, LBBB, SYNCOPE  Explantation Comments: 05/20/10 Medtronic [id number] H explanted  ICD Follow Up ICD Dependent:  Yes      Episodes Coumadin:  No  Brady Parameters Mode DDDR     Lower Rate Limit:  60     Upper Rate Limit 140 PAV 180     Sensed AV Delay:  150  Tachy Zones VF:  200     VT:  240     VT1:  158

## 2010-06-11 NOTE — Letter (Signed)
Summary: Implantable Device Instructions  Architectural technologist, Main Office  [address]   [place], [place] [address]   Phone: [phone]  Fax: [phone]      Implantable Device Instructions Rev-1 May 06, 2010  You are scheduled for:   __x___ Generator Change and Lead Revision  on May 20, 2010 at 1:30 pm with Dr. Graciela Husbands.  1.  Please arrive at the Short Stay Center at [facility] at 11:30 am on the day of your procedure.  2.  You may have clear liquids until 5:30 am the morning of your procedure.  Clear liquids include water, Sprite, Ginger Ale, cranberry/grape/apple juice, coffee, tea (no sugar), broth, and popsicles from clear juices.   3.  Complete lab work on May 14, 2009 at 10:00 in [place]. You do not have to be fasting.  4.  Take morning meds with a few sips of water.   5.  Plan for an overnight stay.  Bring your insurance cards and a list of your medications.  6.  Wash your chest and neck with antibacterial soap (any brand) the evening before and the morning of your procedure.  Rinse well.    *If you have ANY questions after you get home, please call the office [phone].  Claris Gladden, RN  *Every attempt is made to prevent procedures from being rescheduled.  Due to the nauture of Electrophysiology, rescheduling can happen.  The physician is always aware and directs the staff when this occurs.

## 2010-06-17 NOTE — Cardiovascular Report (Signed)
Summary: Pre-Op Orders  Pre-Op Orders   Imported By: Marylou Mccoy 06/11/2010 16:12:39  _____________________________________________________________________  External Attachment:    Type:   Image     Comment:   External Document

## 2010-06-17 NOTE — Cardiovascular Report (Signed)
Summary: Office Visit   Office Visit   Imported By: Roderic Ovens 06/11/2010 10:30:14  _____________________________________________________________________  External Attachment:    Type:   Image     Comment:   External Document

## 2010-06-18 NOTE — Discharge Summary (Signed)
Ryan [name], Ryan [name]                 ACCOUNT NO.:  [id number]  MEDICAL RECORD NO.:  [id number]          PATIENT TYPE:  OIB  LOCATION:  6531                         FACILITY:  [facility]  PHYSICIAN:  Doylene Canning. Ladona Ridgel, MD    DATE OF BIRTH:  [birth year]  DATE OF ADMISSION:  05/20/2010 DATE OF DISCHARGE:  05/21/2010                              DISCHARGE SUMMARY   PROCEDURES: 1. Explantation of the previously implanted device. 2. Insertion of a new Medtronic, D314TRG device. 3. Insertion of a Medtronic [id number] defibrillator lead. 4. Pocket revision with intraoperative defibrillation threshold     testing. 5. Two-view chest x-ray pre and post procedure.  PRIMARY FINAL DISCHARGE DIAGNOSIS:  Complete heart block with a 6949 lead, and a device at nearing elective replacement indicator.  SECONDARY DIAGNOSES: 1. History of syncope. 2. Nonischemic cardiomyopathy with an ejection fraction of 25-30% by     echocardiogram in 2005. 3. Diabetes, diet controlled. 4. Hypertension. 5. Gout. 6. Short runs of paroxysmal atrial fibrillation.  TIME OF DISCHARGE:  32 minutes.  HOSPITAL COURSE:  Mr. Kumari is a 60 year old male with a history of nonischemic cardiomyopathy.  He was noted to have a 6949 lead and his device was nearing ERI.  He was evaluated by Dr. Graciela Husbands, and it was decided to bring him in for a generator change and lead change.  He came to the hospital for the procedure on May 20, 2010.  Mr. Youtz had successful generator change.  The device was a CRT device with the LV port plugged.  The device was a Medtronic D314TRG device and the lead was cathed with a Medtronic [phone] dual-coil defibrillator lead placed.  Defibrillator testing went well.  On May 21, 2010, there was no hematoma at the incision.  A postprocedure chest x-ray showed new AICD in good position without complicating features.  The device check showed normal device function.  Once Mr. Mccune completes antibiotics, he is  considered stable for discharge, to follow up as an outpatient.  DISCHARGE INSTRUCTIONS: 1. His activity level is to be increased gradually per the discharge     instruction sheet. 2. He is to call our office for problems with the incision. 3. He is encouraged to stick to a low-sodium diabetic diet. 4. He is to follow up with a wound check on June 01, 2010, at 4:30.     He is to follow up with Dr. Graciela Husbands in 3 months and the office will     contact him.  DISCHARGE MEDICATIONS: 1. Tylenol 325 mg, dose 650 mg q.4 h. P.r.n. 2. Micardis 80 mg a day. 3. Allopurinol 300 mg a day. 4. Coreg 25 mg b.i.d. 5. Amlodipine 5 mg a day. 6. Pravastatin 20 mg nightly. 7. Oxycodone 5 mg q.6 h. p.r.n. #15, no refills. 8. Aspirin 81 mg a day. 9. Nexium 40 mg a day. 10.Vitamin D2 - 50,000 units weekly.     Theodore Demark, PA-C   ______________________________ Doylene Canning. Ladona Ridgel, MD    RB/MEDQ  D:  05/21/2010  T:  05/22/2010  Job:  [id number]  cc:   Dr. Vonita Moss  Electronically Signed by Theodore Demark PA-C on 05/29/2010 06:45:29 AM Electronically Signed by Lewayne Bunting MD on 06/18/2010 05:11:30 PM

## 2010-06-18 NOTE — Discharge Summary (Signed)
  Ryan [name], Ryan [name]                 ACCOUNT NO.:  [id number]  MEDICAL RECORD NO.:  [id number]          PATIENT TYPE:  OIB  LOCATION:  6531                         FACILITY:  [facility]  PHYSICIAN:  Doylene Canning. Ladona Ridgel, MD    DATE OF BIRTH:  August 17, 1950  DATE OF ADMISSION:  05/20/2010 DATE OF DISCHARGE:  05/21/2010                              DISCHARGE SUMMARY   ADDENDUM:  Upon review of his medications with the patient, he stated that he had been taken off the Norvasc because when Dr. Dossie Arbour tried to increase it from 5->10 mg a day, he had side-effects and was not able to tolerate it.  Therefore, Dr. Dossie Arbour changed him to verapamil SR 240 mg daily.  The patient has been taking this for about a week.  All other medications and discharge plans are unchanged.     Theodore Demark, PA-C   ______________________________ Doylene Canning. Ladona Ridgel, MD    RB/MEDQ  D:  05/21/2010  T:  05/22/2010  Job:  [id number]  Electronically Signed by Theodore Demark PA-C on 05/29/2010 06:45:56 AM Electronically Signed by Lewayne Bunting MD on 06/18/2010 05:11:37 PM

## 2010-07-22 LAB — GLUCOSE, CAPILLARY: Glucose-Capillary: 133 mg/dL — ABNORMAL HIGH (ref 70–99)

## 2010-09-22 NOTE — Progress Notes (Signed)
[facility] OFFICE NOTE   NAME:Ryan [name], Ryan [name]                        [name]:          [id number]  DATE:06/17/2008                            DOB:          12/31/1950    Ryan [name] is here in followup for an ICD implanted for syncope in the  setting of a nonischemic cardiomyopathy.  He is now device dependent.  He has no complaints of chest pain or shortness of breath.   Thromboembolic risk factors are notable for:  A.  Hypertension.  B.  LV dysfunction.  C.  Question diabetes with hemoglobin A1c of 6.   MEDICATIONS:  1. Micardis.  2. Pravastatin.  3. Coreg 25 b.i.d.  4. Amlodipine 5.  5. Nexium 40.  6. Allopurinol.  7. Aspirin 81.   PHYSICAL EXAMINATION:  VITAL SIGNS:  I do not recall his blood pressure.  His heart rate was 74.  LUNGS:  Clear.  HEART:  Sounds were regular.  ABDOMEN:  Soft.  EXTREMITIES:  Without edema.   Interrogation of his Medtronic ICD demonstrates a P-wave of 1.6 with  impedance of 424 and threshold 0.5 at 0.4.  There is no intrinsic  ventricular rhythm.  Impedance was 624 and threshold 1 volt at 0.4.  There was no therapies.  There were a few episodes of nonsustained  atrial fibrillation.  He had a 6949 lead in place and the SIC count is  normal.   IMPRESSION:  1. Nonischemic cardiomyopathy.  2. History of syncope.  3. Status post implantable cardioverter-defibrillator for the above.  4. Recurrent development of complete heart block.  5. 6949 lead.  6. Paroxysmal atrial fibrillation - short runs.  7. Thromboembolic risk factors notable for hypertension, left      ventricular dysfunction, and question diabetes.   Ryan [name] device is functioning normally.  He has no symptoms.  We  reviewed the 6949 lead issues.   We also discussed the relevance of atrial fibrillation that is  nonsustained.  There are data that had suggested that in the setting of  [place] score greater  than 2 short runs of atrial fibrillation should be  anticoagulated with Coumadin.  We do not have good data on that, so at  this point given the brevity of these episodes, we will hold off.  I  have reviewed this with the family.   We will see him again in 6 months' time.     Duke Salvia, MD, [facility]  Electronically Signed    SCK/MedQ  DD: 06/17/2008  DT: 06/18/2008  Job #: [id number]   cc:   Marcina Millard, MD

## 2010-09-22 NOTE — Letter (Signed)
November 20, 2007    Marcina Millard, MD  [address]  [place], [place] [address].   RE:  JONCARLOS, ATKISON  [name]:  [id number]  /  DOB:  1950/07/06   Dear Trinna Post,   I hope this letter finds you well.  Mr. Gens comes in today seeing  traces again of the referral for his ICD.  He has no complaints.  No  limitations of activity.  No intercurrent ICD discharges.   He has had some problems with lightheadedness with standing.  This has  been relatively self-terminating.   MEDICATIONS:  1. Coreg 25 b.i.d.  2. Micardis HCT 80/12.5.  3. Norvasc 5.  4. Pravastatin 40.   PHYSICAL EXAMINATION:  VITAL SIGNS:  His blood pressure was mildly  elevated today at 146/82, but he says the last time when it was checked  was 120/70.  His pulse was 80.  LUNGS:  Clear.  HEART:  Sounds were regular.  EXTREMITIES:  Without edema.  NEUROLOGIC:  Grossly normal.   Interrogation of his Medtronic ICD demonstrates a P-wave of 0.5 with an  impedance of 464.  The threshold of 1 volt at 0.4.  There is no  intrinsic ventricular rhythm.  The impedance was 616.  The threshold of  1 volt at 0.4.  Battery voltage 3.04.  His 6949 lead counters are  stable.   IMPRESSION:  1. Nonischemic cardiomyopathy.  2. Syncope.  3. Device dependence with complete heart block.  4. Status post DDD implantable cardioverter-defibrillator.  5. Mild orthostatic intolerance.  6. A 6949 lead.   Alex, Mr. Oyama is doing really very well.  The big concern in my mind is  his device dependence with a 6949 lead, and at this point, the  recommendations are to follow them along, this may change, but for now  we will do that.   If anything else I can do, please do not hesitate to contact me.    Sincerely,      Duke Salvia, MD, [facility]  Electronically Signed    SCK/MedQ  DD: 11/20/2007  DT: 11/21/2007  Job #: [id number]

## 2010-09-22 NOTE — Letter (Signed)
November 08, 2006    Dr. Jamse Mead  [facility]  [address]  [place], [place]  [address]   RE:  Ryan, [name]  [name]:  [id number]  /  DOB:  May 19, 1950   Dear Trinna Post:   Ryan [name] comes in today.  He is feeling really well.  He has non-  ischemic heart disease, as you know, and we initially saw him  initially  for syncope.  What is interesting, as noted from his device  interrogation, he is now device-dependent.   MEDICATIONS:  1. Coreg 25 mg b.i.d.  2. Micardis 80/12.5 mg.  3. Norvasc 5 mg.  4. Nexium.  5. Aspirin.   PHYSICAL EXAMINATION:  VITAL SIGNS:  Blood pressure 134/88, pulse 86.  LUNGS:  Clear.  HEART:  Sounds regular.  EXTREMITIES:  Without edema.   Interrogation of his Medtronic Entra pulse generator demonstrates a P-  wave of 1.1 with impedance of 472, threshold of 1 volt at 0.4.  There is  no intrinsic ventricular rhythm.  The impedance was 528, threshold 0.5  at 0.4.  Impedances were 47/57, battery voltage of 3.1.  The device was  reprogrammed to increase his maximum track rate to 140.   IMPRESSION:  1. Syncope.  2. Non-ischemic cardiopathy.  3. Now device dependence and with complete heart block.  4. Status post atrioventricular implantable cardioverter defibrillator      for the above.   Ryan [name], Ryan [name], is doing well.  I told him he can go ahead and exercise  and to use a heart rate monitor.  If he finds that his heart rate is  encroaching the 120's, he will need to let us know.  We might have to  reprogram his device further.   I will plan to see him again in one year's time.  He will follow  remotely in the interim.    Sincerely,      Ryan Salvia, MD, [facility]  Electronically Signed    SCK/MedQ  DD: 11/08/2006  DT: 11/08/2006  Job #: [id number]   CC:    Vonita Moss, MD

## 2010-09-25 NOTE — Op Note (Signed)
NAMETRICIA, OAXACA NO.:  [id number]   MEDICAL RECORD NO.:  [id number]          PATIENT TYPE:  OUT   LOCATION:  CATH                         FACILITY:  [facility]   PHYSICIAN:  Duke Salvia, M.D.  DATE OF BIRTH:  11-22-1950   DATE OF PROCEDURE:  09/11/2004  DATE OF DISCHARGE:                                 OPERATIVE REPORT   PREOPERATIVE DIAGNOSES:  Nonischemic cardiomyopathy, left bundle branch  block, first-degree AV block, and syncope.   POSTOPERATIVE DIAGNOSES:  Nonischemic cardiomyopathy, left bundle branch  block, first-degree AV block, and syncope.   PROCEDURE:  Dual chamber defibrillator implantation with intraoperative  defibrillation threshold testing.   DESCRIPTION OF PROCEDURE:  Following the obtainment of informed consent, the  patient was brought to the electrophysiology laboratory and placed on the  fluoroscopic table in the supine position.  After routine prep and drape of  the left upper chest, lidocaine was infiltrated in the prepectoral  subclavicular region.  An incision was made and carried down through the  layer of the prepectoral fascia using electrocautery and sharp dissection.  A pocket was formed similarly, hemostasis was obtained.   Thereafter, attention was turned to gaining access to the extrathoracic left  subclavian vein, which was accomplished without difficulty and without  puncture of the artery.  This, however, had been preceded by a contrast  venogram.  This having been accomplished, then two separate venipunctures  were accomplished, guidewires were placed and retained, and a #0 silk suture  was placed in a figure-of-eight fashion and allowed to hang loosely.  Sequentially, 7-French tearaway introducer sheaths were placed, which were  then passed and a Medtronics 6949, 65 cm dual coil active fixation  defibrillator lead, serial #[id number].  Under fluoroscopic guidance, this  was manipulated to the right ventricular apex  where the bipolar R-wave was  12.0 mV with a pacing impedance of 952 ohms and a threshold of 0.9 V at 0.5  msec, currented threshold was 1.1 MA, and there was no diaphragmatic pacing  at 10 V.  This lead was secured to the prepectoral fascia and then the  Medtronic 5076, 52 cm active fixation atrial lead, serial #[id number], was  moved under fluoroscopic guidance into the right atrial appendage where the  bipolar P-wave was 7.3 mV with a pacing impedance of 1052 ohms and a  threshold of 0.7 V at 0.5 msec, and currented threshold was 0.7 MA.  With  these acceptable parameters recorded, the leads were secured to the  prepectoral fascia.  They were then attached to a Medtronic Entrust D154ATG  defibrillator, serial #[id number].  Through the device, the bipolar P-wave  was 3.7 mV with a pacing impedance of 640 ohms with a threshold of 2.5 V at  0.8 and 0.4 msec.  These measured parameters remained stable, and it was  elected to leave this lead in this location.  The bipolar R-wave was 8.9 mV  with a pacing impedance of 952 ohms with a threshold of 1V at 0.4 msec.  The  high voltage impedance was 59 ohms and the proximal coil  impedance was 58  ohms.  With these acceptable parameters recorded, defibrillation threshold  testing was undertaken.  First, the pocket was copiously irrigated with  antibiotics containing saline solution, hemostasis was assured, and the  leads in the pulse generator were placed in the pocket and secured to the  prepectoral fascia.   Ventricular fibrillation was induced through the T-wave shock.  After a  total duration of 6.5 seconds, a 15-joule shock was delivered through a  measured resistance of 44 ohms terminating ventricular fibrillation and  restoring a sinus rhythm.   After a wait of five to six minutes, ventricular fibrillation was reinduced  via the T-wave shock.  After a total duration of six seconds, a 15-joule  shock was delivered through a measured  resistance of 45 ohms terminating  ventricular fibrillation and restoring a sinus rhythm.  The right atrial  parameters were again measured and they were remaining stable.  The wound  was then closed in three layers in the normal fashion.  The wound was washed  and dried, and a Benzoin-Steri-Strip dressing was applied.  Needle counts,  sponge counts, and instrument counts were correct at the end of the  procedure, according to the staff.  The patient tolerated the procedure  without apparent complication.      SCK/MEDQ  D:  09/11/2004  T:  09/12/2004  Job:  [id number]   cc:   [facility]   Electrophysiology Laboratory

## 2010-09-25 NOTE — Discharge Summary (Signed)
[name], [name]:  [id number]   MEDICAL RECORD NO.:  [id number]          PATIENT TYPE:  INP   LOCATION:  2855                         FACILITY:  [facility]   PHYSICIAN:  Duke Salvia, M.D.  DATE OF BIRTH:  08/27/1950   DATE OF ADMISSION:  09/11/2004  DATE OF DISCHARGE:  09/11/2004                                 DISCHARGE SUMMARY   DISCHARGE DIAGNOSES:  1.  Discharging day number one, status post implantation of Medtronic      EnTrust cardioverter defibrillator with fibrillator threshold study less      than or equal to 15 joules.  2.  History of recurrent syncope.  3.  First syncope, June 2005, with subsequent left heart catheterization      demonstrating normal coronary arteries but a nonischemic cardiomyopathy,      ejection fraction 25-30%.  4.  Follow up echocardiogram, July 2005, ejection fraction 25-30% with      global hypokinesis.  5.  Class I-II congestive heart failure symptoms.   SECONDARY DIAGNOSES:  1.  Diabetes, diagnosed two weeks ago, controlled with diet currently.  2.  Hypertension.  3.  Gout.   PROCEDURE:  Sep 11, 2004, implantation of Medtronic cardioverter  defibrillator by Dr. Sherryl Manges with successful defibrillator threshold  study less than or equal to 15 joules.   DISCHARGE DISPOSITION:  Ryan [name] discharging day number one, after  implantation of cardioverter defibrillator.  The incision is healing nicely  without swelling, erythema, or drainage.  The patient has had no cardiac  dysrhythmias, no respiratory distress.  He continues a sinus rhythm.  His  mobility issues concerning the left upper extremity have been described to  the patient.  His device has been interrogated.  A chest x-ray has been  examined and leads are in appropriate position.  He has been afebrile this  hospitalization.   DISCHARGE MEDICATIONS:  1.  Vitamin C 500 mg daily.  2.  Norvasc 5 mg daily.  3.  Nexium 40 mg daily.  4.   Micardis/hydrochlorothiazide 80/12.5, one tab daily.  5.  Coreg 25 mg twice daily.  6.  Aspirin 81 mg daily.  7.  Allopurinol 300 mg daily.   PAIN MANAGEMENT:  Tylenol 325 mg, 1-2 tabs every four to six hours as needed  for pain.   The bandage at the left subclavian area will be removed Saturday, Sep 12, 2004, and left open to air.  He is to keep his incision dry for the next  seven days and sponge bathe until Friday, Sep 18, 2004.   The patient has been given a mobility sheet describing motion of the left  arm.   DISCHARGE DIET:  Low sodium, low cholesterol.   He has an office visit with [facility] at [address]  Monday, Sep 28, 2004 at 4 p.m.  He will also see Dr. Graciela Husbands at that time, and  he is asked to call Dr. __________'s office to see him in two weeks.  His  telephone number is [phone].   BRIEF HISTORY:  Ryan [name] is a 60 year old male.  He has no prior history of  coronary artery disease but, in June 2005, he had a syncopal episode.  He  underwent catheterization for this with a study showing normal coronary  arteries but severely depressed left ventricle ejection fraction of about  36%.  A followup echocardiogram was taken, December 05, 2003, ejection fraction  was 25-30% with global hypokinesis.  He had no episodes since that time  either of presyncope or syncope until about three weeks ago.  The patient  had a very busy day, was [place] weekend, there was a large family gathering.  The patient became nauseated.  He was sitting down at the time.  He was  noted by his wife to have a brief loss of consciousness.  Dr. Graciela Husbands through  Dr. _________ was consulted for placement of ICD.  The patient has a left  bundle branch block; however, his congestive heart failure are class I-II.  On the evening prior to this visit, Sep 10, 2004, the patient had chest pain,  possible spasms, possible anxiety.  He presented to [facility]  He was ruled out  for myocardial infarction by serial enzymes.  The chest pain has dissipated soon after his admission to [facility].  He  presents today for ICD implantation.   HOSPITAL COURSE:  The patient presents Sep 12, 2003, for an ICD implantation.  This was done successfully by Dr. Sherryl Manges.  The patient has had no post  procedural complications.  The incision looks good.  He has had no fever.  His rhythm has remained a sinus rhythm, discharging day number one.      GM/MEDQ  D:  09/11/2004  T:  09/12/2004  Job:  [id number]   cc:   Duke Salvia, M.D.   Dr. Verna Czech????

## 2010-09-25 NOTE — H&P (Signed)
Ryan [name]:  [id number]   MEDICAL RECORD NO.:  [id number]          PATIENT TYPE:  OUT   LOCATION:  CATH                         FACILITY:  [facility]   PHYSICIAN:  Ryan [name], M.D.  DATE OF BIRTH:  July 29, 1950   DATE OF ADMISSION:  09/11/2004  DATE OF DISCHARGE:                                HISTORY & PHYSICAL   PRIMARY CARE PHYSICIAN:  Dr. Rito [name].   ELECTROPHYSIOLOGIST:  Dr. Sherryl [name]   ALLERGIES:  No known drug allergies.   HISTORY OF PRESENT ILLNESS:  Ryan [name] is a 60 year old male with no prior  history of coronary artery disease but he did have a syncopal episode in  June, 2005. Left heart catheterization was done at that time at [facility]. The study showed normal coronary arteries but severely depressed  ejection fraction which was 36% at the time. He had a follow up  echocardiogram December 05, 2003, ejection fraction at that time was 25 to 30%  with global hypokinesis. He had no episodes of pre-syncope or syncope until  about 3 weeks ago. He had a very busy day, it was at an [place] weekend  family gathering. He became nauseated, was sitting down and noted by his  wife to have had brief loss of consciousness. He returned quickly to  consciousness, did not have any seizure-like activity. No chest pain, no  shortness of breath. He has been consulted by Dr. Sherryl [name] at  [facility] for implantation for ICD. The patient does have a left bundle  branch block, and non ischemic cardiomyopathy.  His congestive heart failure  symptoms are class I to class II. On the evening of Sep 10, 2004 the patient  had chest pain possibly secondary to spasm. He presented to [facility] where he was ruled out for myocardial infarction by enzymes. His  chest pain subsided soon after his admission. He is not currently having  chest pain.   Cardiac risk equivalent includes diabetes diagnosed about 2 weeks ago and is  currently diet  controlled. Positive for hypertension. He has no prior  history of coronary artery bypass graft surgery. Once again, his ejection  fraction is 25-30% by echocardiogram in July, 2005.   MEDICATIONS:  1.  Vitamin C 500 mg daily.  2.  Norvasc 5 mg daily.  3.  Nexium 40 mg daily.  4.  Micardis/hydrochlorothiazide 12.5 mg daily.  5.  Coreg 25 mg b.i.d.  6.  Aspirin 81 mg daily.  7.  Allopurinol 300 mg daily.   SOCIAL HISTORY:  The patient lives in [place] with his wife. 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 No tobacco products. He does  drink alcohol modestly (3 beers a week).   FAMILY HISTORY:  Mother died of cancer, she had diabetes. Father died of  cancer, he had diabetes. He has two brothers and one sister. No coronary  artery disease.   REVIEW OF SYSTEMS:  CONSTITUTIONAL: The patient is not having fevers,  chills, night sweats, weight change, either plus or minus, or adenopathy.  HEENT: No nasal discharge, no epistaxis, no vertigo. He does note a voice  change about 6 months ago but it apparently runs in the family and is  transient. No photophobia, no diplopia. INTEGUMENT:  No lesions, ulcerations  or rashes. CARDIOPULMONARY:  Chest pain as described in the history of  present illness within 24 hours of this admission he was ruled out for  myocardial infarction by serial enzymes. Not short of breath, no dyspnea on  exertion, no orthopnea, no paroxysmal nocturnal dyspnea. No edema. He does  have a history of presyncope and syncope. He had recurrent syncope about 3  weeks ago. No claudication. No palpitations. UROGENITAL:  No nocturia, no  frequency or urgency. NEUROPSYCHIATRIC:  No weakness, numbness, depression,  anxiety. GI: Does have a history of gastroesophageal reflux disease. No  history of peptic ulcer disease. No history of hematochezia or hematemesis.  ENDOCRINE: The patient newly diagnosed with diabetes, currently diet  controlled. The patient is  euthroid. MUSCULOSKELETAL: No arthralgia, no  joint swelling. All systems negative otherwise.   PHYSICAL EXAMINATION:  VITAL SIGNS:  Temperature 97.8, blood pressure  142/72, pulse is 75, respirations are 20, oxygen saturation 96% on room air.  GENERAL:  This is an alert and oriented individual x3. No acute distress  currently. No chest pain.  HEENT:  Normocephalic, atraumatic. Eyes - pupils are equal, round and  reactive to light. Extraocular movements are intact.  Sclerae are clear.  Nares without discharge. The patient does not wear dentures.  NECK:  Supple, no carotid bruits auscultated. No thyromegaly. No jugular  venous distension. No cervical lymphadenopathy.  HEART:  Regular rate and rhythm without murmur.  LUNGS:  Clear to auscultation and percussion bilaterally.  NEUROLOGIC:  Cranial nerves II-XII grossly intact.   Electrocardiogram shows sinus rhythm with left bundle branch block and a  rate of 85.   LABORATORY STUDIES:  Are currently on the patient's chart and will be  dictated later.   IMPRESSION:  1.  Admitted with recent syncope and syncope in June, 2005.  2.  Non ischemic cardiomyopathy with ejection fraction 25-30% by      echocardiogram in July, 2005.  3.  Left heart catheterization June 2005, normal coronary arteries.  4.  Diabetes diagnosed 2 weeks ago. Dietary controlled.  5.  Class I to II congestive heart failure.  6.  Hypertension.  7.  Gout.   PLAN:  Implantation ICD by Dr. Graciela Husbands today.      GM/MEDQ  D:  09/11/2004  T:  09/11/2004  Job:  [id number]

## 2010-09-25 NOTE — Letter (Signed)
November 16, 2005     Dr. Trinna Post Paraschos  [address]  [place], [place] [address]   RE:  Ryan [name], Ryan [name]  [name] #[id number]  /  DOB:  [birth year]   Dear Trinna Post,   Mr. Hitsman was seen for an ICD implantation about a year ago for syncope in  the setting of his non-ischemic  heart disease.  He is doing really, really  well without any limitations.   He has felt some palpitations occasionally.   Review of his medications demonstrates Coreg 25 b.i.d., Micardis 80/12.5,  Norvasc 5, and aspirin.   On examination, his blood pressure was mildly elevated at 141/86, pulse of  75.  Lungs were clear.  Heart sounds were regular.  Extremities were without  edema.   Interrogation with Medtronic EnTrust device demonstrates that he has  intermittent P-wave activity with interrogated amplitudes of 0.7 to 2.1 with  impedance of 520, threshold of 0.5 to 0.4.  The R-wave was 8.9 with  impedence of 512, threshold of 0.5 to 0.4 __________ was 3.17. There were no  intercurrent tachyarrhythmias.  Interestingly, he is A-paced 70% of the time  and V-paced 5% of the time.  It is hard to me to understand initially why it  was that he is V-paced when he is in an MVP algorithm, but it must be that  this represents either atrial oversensing because of his atrial sensitivity  programmed at 0.6 or it may represent intermittent heart block requiring  ventricular pacing which may have been the original cause of his syncope.   I have asked him to follow up with you to get an ultrasound to reassess his  LV function.  He is on remote followup and we will be  glad to see him in a year.  If you want to have him follow up with Netta Neat, just let us know and we will defer that all to you.   Sincerely,     Duke Salvia, MD, [facility]   SCK/MedQ  DD:  11/16/2005  DT:  11/16/2005  Job #:  [id number]

## 2010-09-28 ENCOUNTER — Ambulatory Visit (INDEPENDENT_AMBULATORY_CARE_PROVIDER_SITE_OTHER): Payer: BC Managed Care – PPO | Admitting: Internal Medicine

## 2010-09-28 ENCOUNTER — Encounter: Payer: Self-pay | Admitting: Internal Medicine

## 2010-09-28 DIAGNOSIS — I119 Hypertensive heart disease without heart failure: Secondary | ICD-10-CM | POA: Insufficient documentation

## 2010-09-28 DIAGNOSIS — Z95810 Presence of automatic (implantable) cardiac defibrillator: Secondary | ICD-10-CM | POA: Insufficient documentation

## 2010-09-28 DIAGNOSIS — I5022 Chronic systolic (congestive) heart failure: Secondary | ICD-10-CM

## 2010-09-28 DIAGNOSIS — I428 Other cardiomyopathies: Secondary | ICD-10-CM

## 2010-09-28 DIAGNOSIS — I442 Atrioventricular block, complete: Secondary | ICD-10-CM

## 2010-09-28 MED ORDER — NEBIVOLOL HCL 20 MG PO TABS: 20.0000 mg | ORAL_TABLET | Freq: Every day | ORAL | Status: DC

## 2010-09-28 MED ORDER — TELMISARTAN-HCTZ 80-25 MG PO TABS: 1.0000 | ORAL_TABLET | Freq: Every day | ORAL | Status: DC

## 2010-09-28 NOTE — Assessment & Plan Note (Signed)
stable °

## 2010-09-28 NOTE — Assessment & Plan Note (Signed)
The patient's device was interrogated.  The information was reviewed. No changes were made in the programming.    

## 2010-09-28 NOTE — Assessment & Plan Note (Signed)
Patient has had significant problems with hypertension. He is currently on a powerful negatively inotropic drugs i.e. Verapamil. I will like to discontinue this in the context of his cardiomyopathy. Will plan to increase his bystolic with which I am not very familiar. I would favor labetalol as it is closer to carvedilol. In addition will add hydrochlorothiazide. He'll followup with his PCP in a few weeks about his blood pressure.

## 2010-09-28 NOTE — Assessment & Plan Note (Signed)
As above.

## 2010-09-28 NOTE — Patient Instructions (Signed)
Your physician has recommended you make the following change in your medication: STOP Verapamil. START Micardis HCT 80/25mg  once daily. INCREASE Bystolic to 20mg  daily.  Your physician recommends that you schedule a follow-up appointment in: February 2013.

## 2010-09-28 NOTE — Progress Notes (Signed)
  HPI  Ryan [name] is a 60 y.o. male Seen  in followup for an ICD implanted for syncope in the setting of a nonischemic cardiomyopathy.  He is now device dependent. He had 6949 lead in place and underwent generator replacement with a new 6947-lead January 2012  He has no complaints of chest pain or shortness of breath. Excise tolerance remains   good. He thinks his exercise tolerance is better with new device  Past Medical History  Diagnosis Date  . Nonischemic cardiomyopathy   . History of syncope   . Complete heart block   . Paroxysmal atrial fibrillation   . Hypertension   . Ventricular dysfunction     left  . Diabetes mellitus     Past Surgical History  Procedure Date  . Cardiac defibrillator placement     Current Outpatient Prescriptions  Medication Sig Dispense Refill  . allopurinol (ZYLOPRIM) 300 MG tablet Take 300 mg by mouth daily.        Marland Kitchen aspirin 81 MG EC tablet Take 81 mg by mouth daily.        . ergocalciferol (VITAMIN D2) 50000 UNITS capsule Take 50,000 Units by mouth once a week.        . esomeprazole (NEXIUM) 40 MG capsule Take 40 mg by mouth daily before breakfast.        . nebivolol (BYSTOLIC) 10 MG tablet Take 10 mg by mouth daily.        . pravastatin (PRAVACHOL) 40 MG tablet Take 40 mg by mouth daily.        Marland Kitchen telmisartan (MICARDIS) 80 MG tablet Take 80 mg by mouth daily.        . verapamil (VERELAN PM) 300 MG 24 hr capsule Take 300 mg by mouth at bedtime.        Marland Kitchen DISCONTD: pravastatin (PRAVACHOL) 20 MG tablet Take 20 mg by mouth daily.        Marland Kitchen DISCONTD: verapamil (VERELAN PM) 240 MG 24 hr capsule Take 240 mg by mouth at bedtime.        Marland Kitchen DISCONTD: carvedilol (COREG) 25 MG tablet Take 25 mg by mouth 2 (two) times daily with a meal.          No Known Allergies  Review of Systems negative except from HPI and PMH  Physical Exam Well developed and well nourished in no acute distress HENT normal E scleral and icterus clear Neck Supple JVP flat;  carotids brisk and full Clear to ausculation Device pocket well healed Regular rate and rhythm, no murmurs gallops or rub Soft with active bowel sounds No clubbing cyanosis and edema Alert and oriented, grossly normal motor and sensory function Skin Warm and Dry     Assessment and  Plan

## 2010-09-28 NOTE — Assessment & Plan Note (Signed)
Stable; will continue on his current medications except as noted above

## 2010-12-31 ENCOUNTER — Encounter: Payer: BC Managed Care – PPO | Admitting: *Deleted

## 2011-01-07 ENCOUNTER — Encounter: Payer: Self-pay | Admitting: *Deleted

## 2011-01-12 ENCOUNTER — Ambulatory Visit (INDEPENDENT_AMBULATORY_CARE_PROVIDER_SITE_OTHER): Payer: BC Managed Care – PPO | Admitting: *Deleted

## 2011-01-12 ENCOUNTER — Other Ambulatory Visit: Payer: Self-pay | Admitting: Internal Medicine

## 2011-01-12 ENCOUNTER — Encounter: Payer: Self-pay | Admitting: Internal Medicine

## 2011-01-12 DIAGNOSIS — I5022 Chronic systolic (congestive) heart failure: Secondary | ICD-10-CM

## 2011-01-12 DIAGNOSIS — I428 Other cardiomyopathies: Secondary | ICD-10-CM

## 2011-01-12 DIAGNOSIS — Z95810 Presence of automatic (implantable) cardiac defibrillator: Secondary | ICD-10-CM

## 2011-01-12 DIAGNOSIS — I442 Atrioventricular block, complete: Secondary | ICD-10-CM

## 2011-01-12 LAB — REMOTE ICD DEVICE
AL AMPLITUDE: 1.056 mv
AL IMPEDENCE ICD: 440 Ohm
ATRIAL PACING ICD: 55.86 pct
BAMS-0001: 170 {beats}/min
BATTERY VOLTAGE: 2.74 V
BRDY-0002RA: 60 {beats}/min
BRDY-0003RA: 140 {beats}/min
BRDY-0004RA: 130 {beats}/min
CHARGE TIME: 10.67 s
DEV-0020ICD: NEGATIVE
FVT: 0
PACEART VT: 0
RV LEAD IMPEDENCE ICD: 664 Ohm
TOT-0001: 2
TOT-0002: 0
TOT-0006: 20060505000000
TZAT-0001ATACH: 1
TZAT-0001ATACH: 2
TZAT-0001ATACH: 3
TZAT-0001FASTVT: 1
TZAT-0001SLOWVT: 1
TZAT-0002ATACH: NEGATIVE
TZAT-0002ATACH: NEGATIVE
TZAT-0002ATACH: NEGATIVE
TZAT-0002SLOWVT: NEGATIVE
TZAT-0004FASTVT: 8
TZAT-0005FASTVT: 88 pct
TZAT-0011FASTVT: 10 ms
TZAT-0012ATACH: 150 ms
TZAT-0012ATACH: 150 ms
TZAT-0012ATACH: 150 ms
TZAT-0012FASTVT: 200 ms
TZAT-0012SLOWVT: 200 ms
TZAT-0013FASTVT: 1
TZAT-0018ATACH: NEGATIVE
TZAT-0018ATACH: NEGATIVE
TZAT-0018ATACH: NEGATIVE
TZAT-0018FASTVT: NEGATIVE
TZAT-0018SLOWVT: NEGATIVE
TZAT-0019ATACH: 6 V
TZAT-0019ATACH: 6 V
TZAT-0019ATACH: 6 V
TZAT-0019FASTVT: 8 V
TZAT-0019SLOWVT: 8 V
TZAT-0020ATACH: 1.5 ms
TZAT-0020ATACH: 1.5 ms
TZAT-0020ATACH: 1.5 ms
TZAT-0020FASTVT: 1.5 ms
TZAT-0020SLOWVT: 1.5 ms
TZON-0003ATACH: 350 ms
TZON-0003FASTVT: 250 ms
TZON-0003SLOWVT: 380 ms
TZON-0003VSLOWVT: 380 ms
TZON-0004SLOWVT: 16
TZON-0004VSLOWVT: 20
TZON-0005SLOWVT: 12
TZST-0001ATACH: 4
TZST-0001ATACH: 5
TZST-0001ATACH: 6
TZST-0001FASTVT: 2
TZST-0001FASTVT: 3
TZST-0001FASTVT: 4
TZST-0001FASTVT: 5
TZST-0001FASTVT: 6
TZST-0001SLOWVT: 2
TZST-0001SLOWVT: 3
TZST-0001SLOWVT: 4
TZST-0001SLOWVT: 5
TZST-0001SLOWVT: 6
TZST-0002ATACH: NEGATIVE
TZST-0002ATACH: NEGATIVE
TZST-0002ATACH: NEGATIVE
TZST-0002SLOWVT: NEGATIVE
TZST-0002SLOWVT: NEGATIVE
TZST-0002SLOWVT: NEGATIVE
TZST-0002SLOWVT: NEGATIVE
TZST-0002SLOWVT: NEGATIVE
TZST-0003FASTVT: 25 J
TZST-0003FASTVT: 35 J
TZST-0003FASTVT: 35 J
TZST-0003FASTVT: 35 J
TZST-0003FASTVT: 35 J
VENTRICULAR PACING ICD: 99.99 pct
VF: 0

## 2011-01-20 LAB — REMOTE ICD DEVICE
AL AMPLITUDE: 0.5 mv
AL IMPEDENCE ICD: 437 Ohm
AL THRESHOLD: 0.5 V
ATRIAL PACING ICD: 65.57 pct
BAMS-0001: 170 {beats}/min
BATTERY VOLTAGE: 3.1858 V
CHARGE TIME: 8.378 s
FVT: 0
LV LEAD IMPEDENCE ICD: 4047 Ohm
PACEART VT: 0
RV LEAD IMPEDENCE ICD: 665 Ohm
RV LEAD THRESHOLD: 0.5 V
TOT-0001: 1
TOT-0002: 0
TOT-0006: 20120111000000
TZAT-0001ATACH: 1
TZAT-0001ATACH: 2
TZAT-0001ATACH: 3
TZAT-0001FASTVT: 1
TZAT-0001SLOWVT: 1
TZAT-0002ATACH: NEGATIVE
TZAT-0002ATACH: NEGATIVE
TZAT-0002ATACH: NEGATIVE
TZAT-0002FASTVT: NEGATIVE
TZAT-0002SLOWVT: NEGATIVE
TZAT-0012ATACH: 150 ms
TZAT-0012ATACH: 150 ms
TZAT-0012ATACH: 150 ms
TZAT-0012FASTVT: 200 ms
TZAT-0012SLOWVT: 200 ms
TZAT-0018ATACH: NEGATIVE
TZAT-0018ATACH: NEGATIVE
TZAT-0018ATACH: NEGATIVE
TZAT-0018FASTVT: NEGATIVE
TZAT-0018SLOWVT: NEGATIVE
TZAT-0019ATACH: 6 V
TZAT-0019ATACH: 6 V
TZAT-0019ATACH: 6 V
TZAT-0019FASTVT: 8 V
TZAT-0019SLOWVT: 8 V
TZAT-0020ATACH: 1.5 ms
TZAT-0020ATACH: 1.5 ms
TZAT-0020ATACH: 1.5 ms
TZAT-0020FASTVT: 1.5 ms
TZAT-0020SLOWVT: 1.5 ms
TZON-0003ATACH: 350 ms
TZON-0003SLOWVT: 360 ms
TZON-0003VSLOWVT: 370 ms
TZON-0004SLOWVT: 16
TZON-0004VSLOWVT: 20
TZON-0005SLOWVT: 12
TZST-0001ATACH: 4
TZST-0001ATACH: 5
TZST-0001ATACH: 6
TZST-0001FASTVT: 2
TZST-0001FASTVT: 3
TZST-0001FASTVT: 4
TZST-0001FASTVT: 5
TZST-0001FASTVT: 6
TZST-0001SLOWVT: 2
TZST-0001SLOWVT: 3
TZST-0001SLOWVT: 4
TZST-0001SLOWVT: 5
TZST-0001SLOWVT: 6
TZST-0002ATACH: NEGATIVE
TZST-0002ATACH: NEGATIVE
TZST-0002ATACH: NEGATIVE
TZST-0002FASTVT: NEGATIVE
TZST-0002FASTVT: NEGATIVE
TZST-0002FASTVT: NEGATIVE
TZST-0002FASTVT: NEGATIVE
TZST-0002FASTVT: NEGATIVE
TZST-0002SLOWVT: NEGATIVE
TZST-0002SLOWVT: NEGATIVE
TZST-0002SLOWVT: NEGATIVE
TZST-0002SLOWVT: NEGATIVE
TZST-0002SLOWVT: NEGATIVE
VENTRICULAR PACING ICD: 100 pct
VF: 0

## 2011-01-25 NOTE — Progress Notes (Signed)
icd remote check w/icm  

## 2011-02-11 ENCOUNTER — Encounter: Payer: Self-pay | Admitting: Internal Medicine

## 2011-02-11 ENCOUNTER — Ambulatory Visit (INDEPENDENT_AMBULATORY_CARE_PROVIDER_SITE_OTHER): Payer: BC Managed Care – PPO | Admitting: *Deleted

## 2011-02-11 ENCOUNTER — Other Ambulatory Visit: Payer: Self-pay | Admitting: Internal Medicine

## 2011-02-11 DIAGNOSIS — I5022 Chronic systolic (congestive) heart failure: Secondary | ICD-10-CM

## 2011-02-11 DIAGNOSIS — I442 Atrioventricular block, complete: Secondary | ICD-10-CM

## 2011-02-11 DIAGNOSIS — I428 Other cardiomyopathies: Secondary | ICD-10-CM

## 2011-02-12 LAB — REMOTE ICD DEVICE
AL AMPLITUDE: 1.25 mv
AL IMPEDENCE ICD: 399 Ohm
AL THRESHOLD: 0.625 V
ATRIAL PACING ICD: 74.06 pct
BAMS-0001: 170 {beats}/min
BATTERY VOLTAGE: 3.1858 V
CHARGE TIME: 8.378 s
FVT: 0
LV LEAD IMPEDENCE ICD: 4047 Ohm
PACEART VT: 0
RV LEAD IMPEDENCE ICD: 703 Ohm
RV LEAD THRESHOLD: 0.625 V
TOT-0001: 1
TOT-0002: 0
TOT-0006: 20120111000000
TZAT-0001ATACH: 1
TZAT-0001ATACH: 2
TZAT-0001ATACH: 3
TZAT-0001FASTVT: 1
TZAT-0001SLOWVT: 1
TZAT-0002ATACH: NEGATIVE
TZAT-0002ATACH: NEGATIVE
TZAT-0002ATACH: NEGATIVE
TZAT-0002FASTVT: NEGATIVE
TZAT-0002SLOWVT: NEGATIVE
TZAT-0012ATACH: 150 ms
TZAT-0012ATACH: 150 ms
TZAT-0012ATACH: 150 ms
TZAT-0012FASTVT: 200 ms
TZAT-0012SLOWVT: 200 ms
TZAT-0018ATACH: NEGATIVE
TZAT-0018ATACH: NEGATIVE
TZAT-0018ATACH: NEGATIVE
TZAT-0018FASTVT: NEGATIVE
TZAT-0018SLOWVT: NEGATIVE
TZAT-0019ATACH: 6 V
TZAT-0019ATACH: 6 V
TZAT-0019ATACH: 6 V
TZAT-0019FASTVT: 8 V
TZAT-0019SLOWVT: 8 V
TZAT-0020ATACH: 1.5 ms
TZAT-0020ATACH: 1.5 ms
TZAT-0020ATACH: 1.5 ms
TZAT-0020FASTVT: 1.5 ms
TZAT-0020SLOWVT: 1.5 ms
TZON-0003ATACH: 350 ms
TZON-0003SLOWVT: 360 ms
TZON-0003VSLOWVT: 370 ms
TZON-0004SLOWVT: 16
TZON-0004VSLOWVT: 20
TZON-0005SLOWVT: 12
TZST-0001ATACH: 4
TZST-0001ATACH: 5
TZST-0001ATACH: 6
TZST-0001FASTVT: 2
TZST-0001FASTVT: 3
TZST-0001FASTVT: 4
TZST-0001FASTVT: 5
TZST-0001FASTVT: 6
TZST-0001SLOWVT: 2
TZST-0001SLOWVT: 3
TZST-0001SLOWVT: 4
TZST-0001SLOWVT: 5
TZST-0001SLOWVT: 6
TZST-0002ATACH: NEGATIVE
TZST-0002ATACH: NEGATIVE
TZST-0002ATACH: NEGATIVE
TZST-0002FASTVT: NEGATIVE
TZST-0002FASTVT: NEGATIVE
TZST-0002FASTVT: NEGATIVE
TZST-0002FASTVT: NEGATIVE
TZST-0002FASTVT: NEGATIVE
TZST-0002SLOWVT: NEGATIVE
TZST-0002SLOWVT: NEGATIVE
TZST-0002SLOWVT: NEGATIVE
TZST-0002SLOWVT: NEGATIVE
TZST-0002SLOWVT: NEGATIVE
VENTRICULAR PACING ICD: 100 pct
VF: 0

## 2011-02-16 ENCOUNTER — Encounter: Payer: Self-pay | Admitting: *Deleted

## 2011-02-22 NOTE — Progress Notes (Signed)
icd remote w/icm check  

## 2011-05-25 ENCOUNTER — Ambulatory Visit (INDEPENDENT_AMBULATORY_CARE_PROVIDER_SITE_OTHER): Payer: BC Managed Care – PPO | Admitting: Internal Medicine

## 2011-05-25 ENCOUNTER — Encounter: Payer: Self-pay | Admitting: Internal Medicine

## 2011-05-25 DIAGNOSIS — I5022 Chronic systolic (congestive) heart failure: Secondary | ICD-10-CM

## 2011-05-25 DIAGNOSIS — I428 Other cardiomyopathies: Secondary | ICD-10-CM

## 2011-05-25 DIAGNOSIS — Z95810 Presence of automatic (implantable) cardiac defibrillator: Secondary | ICD-10-CM

## 2011-05-25 LAB — ICD DEVICE OBSERVATION
AL AMPLITUDE: 1.3 mv
AL IMPEDENCE ICD: 399 Ohm
AL THRESHOLD: 0.625 V
RV LEAD THRESHOLD: 0.625 V
TZAT-0001ATACH: 1
TZAT-0001ATACH: 2
TZAT-0001ATACH: 3
TZAT-0001SLOWVT: 1
TZAT-0002ATACH: NEGATIVE
TZAT-0002ATACH: NEGATIVE
TZAT-0002SLOWVT: NEGATIVE
TZAT-0012ATACH: 150 ms
TZAT-0012ATACH: 150 ms
TZAT-0012SLOWVT: 200 ms
TZAT-0018ATACH: NEGATIVE
TZAT-0018ATACH: NEGATIVE
TZAT-0018ATACH: NEGATIVE
TZAT-0018FASTVT: NEGATIVE
TZAT-0018SLOWVT: NEGATIVE
TZAT-0019ATACH: 6 V
TZAT-0019ATACH: 6 V
TZAT-0019FASTVT: 8 V
TZAT-0019SLOWVT: 8 V
TZAT-0020FASTVT: 1.5 ms
TZON-0003ATACH: 350 ms
TZON-0005SLOWVT: 12
TZST-0001ATACH: 4
TZST-0001ATACH: 5
TZST-0001ATACH: 6
TZST-0001FASTVT: 3
TZST-0001FASTVT: 5
TZST-0001FASTVT: 6
TZST-0001SLOWVT: 4
TZST-0001SLOWVT: 5
TZST-0001SLOWVT: 6
TZST-0002ATACH: NEGATIVE
TZST-0002ATACH: NEGATIVE
TZST-0002FASTVT: NEGATIVE
TZST-0002SLOWVT: NEGATIVE
TZST-0002SLOWVT: NEGATIVE
TZST-0002SLOWVT: NEGATIVE
TZST-0002SLOWVT: NEGATIVE
VENTRICULAR PACING ICD: 100 pct

## 2011-05-25 NOTE — Assessment & Plan Note (Signed)
The patient's device was interrogated.  The information was reviewed. No changes were made in the programming.    

## 2011-05-25 NOTE — Assessment & Plan Note (Signed)
Stable on current meds 

## 2011-05-25 NOTE — Patient Instructions (Signed)
Your physician recommends that you schedule a follow-up appointment in 1 YEAR.  

## 2011-05-25 NOTE — Assessment & Plan Note (Signed)
stabke class1-2  So will not add aldactone

## 2011-05-25 NOTE — Progress Notes (Signed)
  HPI  Ryan [name] is a 61 y.o. male   Seen in followup for an ICD implanted for syncope in the setting of a nonischemic cardiomyopathy. He is now device dependent. He had 6949 lead in place and underwent generator replacement with a new 6947-lead January 2012  He has no complaints of chest pain or shortness of breath. Excise tolerance remains good.  And he thinks is better with new device    Past Medical History  Diagnosis Date  . Nonischemic cardiomyopathy   . History of syncope   . Complete heart block   . Paroxysmal atrial fibrillation   . Hypertension   . Ventricular dysfunction     left  . Diabetes mellitus     Past Surgical History  Procedure Date  . Cardiac defibrillator placement     device generator replacement-January 2012 with insertion of a 6947-lead to replace  6949-lead    Current Outpatient Prescriptions  Medication Sig Dispense Refill  . allopurinol (ZYLOPRIM) 300 MG tablet Take 300 mg by mouth daily.        Marland Kitchen aspirin 81 MG EC tablet Take 81 mg by mouth daily.        . ergocalciferol (VITAMIN D2) 50000 UNITS capsule Take 50,000 Units by mouth once a week.        . esomeprazole (NEXIUM) 40 MG capsule Take 40 mg by mouth daily before breakfast.        . nebivolol 20 MG TABS Take 1 tablet (20 mg total) by mouth daily.  30 tablet  6  . pravastatin (PRAVACHOL) 40 MG tablet Take 40 mg by mouth daily.        Marland Kitchen telmisartan-hydrochlorothiazide (MICARDIS HCT) 80-25 MG per tablet Take 1 tablet by mouth daily.  30 tablet  6    Allergies  Allergen Reactions  . Ace Inhibitors     Review of Systems negative except from HPI and PMH  Physical Exam BP 128/64  Pulse 61  Ht 6' (1.829 m)  Wt 223 lb (101.152 kg)  BMI 30.24 kg/m2 Well developed and well nourished in no acute distress HENT normal E scleral and icterus clear Neck Supple JVP flat; carotids brisk and full Clear to ausculation Regular rate and rhythm, no murmurs gallops or rub Soft with active bowel  sounds No clubbing cyanosis none Edema Alert and oriented, grossly normal motor and sensory function Skin Warm and Dry ECG shows P. synchronous pacing Assessment and  Plan

## 2011-08-26 ENCOUNTER — Encounter: Payer: Self-pay | Admitting: Internal Medicine

## 2011-08-26 ENCOUNTER — Ambulatory Visit (INDEPENDENT_AMBULATORY_CARE_PROVIDER_SITE_OTHER): Payer: BC Managed Care – PPO | Admitting: *Deleted

## 2011-08-26 DIAGNOSIS — I428 Other cardiomyopathies: Secondary | ICD-10-CM

## 2011-08-26 DIAGNOSIS — I5022 Chronic systolic (congestive) heart failure: Secondary | ICD-10-CM

## 2011-08-27 LAB — REMOTE ICD DEVICE
ATRIAL PACING ICD: 60.33 pct
BAMS-0001: 170 {beats}/min
CHARGE TIME: 8.678 s
FVT: 0
TOT-0001: 1
TZAT-0001ATACH: 1
TZAT-0001ATACH: 2
TZAT-0001FASTVT: 1
TZAT-0001SLOWVT: 1
TZAT-0002ATACH: NEGATIVE
TZAT-0002ATACH: NEGATIVE
TZAT-0002ATACH: NEGATIVE
TZAT-0002FASTVT: NEGATIVE
TZAT-0002SLOWVT: NEGATIVE
TZAT-0012ATACH: 150 ms
TZAT-0018ATACH: NEGATIVE
TZAT-0018ATACH: NEGATIVE
TZAT-0018ATACH: NEGATIVE
TZAT-0018SLOWVT: NEGATIVE
TZAT-0019ATACH: 6 V
TZAT-0019ATACH: 6 V
TZAT-0019SLOWVT: 8 V
TZAT-0020ATACH: 1.5 ms
TZON-0003ATACH: 350 ms
TZON-0003SLOWVT: 360 ms
TZON-0004SLOWVT: 16
TZON-0004VSLOWVT: 20
TZON-0005SLOWVT: 12
TZST-0001ATACH: 4
TZST-0001ATACH: 5
TZST-0001FASTVT: 2
TZST-0001FASTVT: 3
TZST-0001FASTVT: 4
TZST-0001FASTVT: 6
TZST-0001SLOWVT: 3
TZST-0001SLOWVT: 4
TZST-0001SLOWVT: 5
TZST-0002ATACH: NEGATIVE
TZST-0002ATACH: NEGATIVE
TZST-0002FASTVT: NEGATIVE
TZST-0002FASTVT: NEGATIVE
TZST-0002SLOWVT: NEGATIVE
TZST-0002SLOWVT: NEGATIVE
VENTRICULAR PACING ICD: 100 pct
VF: 0

## 2011-09-06 NOTE — Progress Notes (Signed)
Remote icd check w/icm  

## 2011-12-02 ENCOUNTER — Encounter: Payer: Self-pay | Admitting: Internal Medicine

## 2011-12-02 ENCOUNTER — Ambulatory Visit (INDEPENDENT_AMBULATORY_CARE_PROVIDER_SITE_OTHER): Payer: BC Managed Care – PPO | Admitting: *Deleted

## 2011-12-02 DIAGNOSIS — I442 Atrioventricular block, complete: Secondary | ICD-10-CM

## 2011-12-02 DIAGNOSIS — I428 Other cardiomyopathies: Secondary | ICD-10-CM

## 2011-12-02 DIAGNOSIS — Z95810 Presence of automatic (implantable) cardiac defibrillator: Secondary | ICD-10-CM

## 2011-12-02 DIAGNOSIS — I5022 Chronic systolic (congestive) heart failure: Secondary | ICD-10-CM

## 2011-12-06 LAB — REMOTE ICD DEVICE
AL AMPLITUDE: 1.375 mv
ATRIAL PACING ICD: 76.89 pct
BAMS-0001: 170 {beats}/min
CHARGE TIME: 9.008 s
FVT: 0
PACEART VT: 0
RV LEAD IMPEDENCE ICD: 646 Ohm
TOT-0001: 1
TZAT-0001ATACH: 3
TZAT-0001FASTVT: 1
TZAT-0002ATACH: NEGATIVE
TZAT-0002FASTVT: NEGATIVE
TZAT-0012ATACH: 150 ms
TZAT-0012ATACH: 150 ms
TZAT-0012ATACH: 150 ms
TZAT-0012SLOWVT: 200 ms
TZAT-0018ATACH: NEGATIVE
TZAT-0018FASTVT: NEGATIVE
TZAT-0018SLOWVT: NEGATIVE
TZAT-0019ATACH: 6 V
TZAT-0019FASTVT: 8 V
TZAT-0019SLOWVT: 8 V
TZAT-0020ATACH: 1.5 ms
TZAT-0020ATACH: 1.5 ms
TZAT-0020SLOWVT: 1.5 ms
TZON-0003ATACH: 350 ms
TZON-0003SLOWVT: 360 ms
TZON-0003VSLOWVT: 370 ms
TZON-0004SLOWVT: 16
TZON-0005SLOWVT: 12
TZST-0001ATACH: 4
TZST-0001ATACH: 6
TZST-0001FASTVT: 2
TZST-0001FASTVT: 3
TZST-0001FASTVT: 6
TZST-0001SLOWVT: 2
TZST-0001SLOWVT: 6
TZST-0002ATACH: NEGATIVE
TZST-0002FASTVT: NEGATIVE
TZST-0002FASTVT: NEGATIVE
TZST-0002SLOWVT: NEGATIVE
TZST-0002SLOWVT: NEGATIVE
TZST-0002SLOWVT: NEGATIVE
VF: 0

## 2012-01-06 ENCOUNTER — Encounter: Payer: Self-pay | Admitting: *Deleted

## 2012-03-06 ENCOUNTER — Encounter: Payer: Self-pay | Admitting: Internal Medicine

## 2012-03-06 ENCOUNTER — Ambulatory Visit (INDEPENDENT_AMBULATORY_CARE_PROVIDER_SITE_OTHER): Payer: BC Managed Care – PPO | Admitting: *Deleted

## 2012-03-06 DIAGNOSIS — I428 Other cardiomyopathies: Secondary | ICD-10-CM

## 2012-03-06 DIAGNOSIS — Z95810 Presence of automatic (implantable) cardiac defibrillator: Secondary | ICD-10-CM

## 2012-03-06 DIAGNOSIS — I5022 Chronic systolic (congestive) heart failure: Secondary | ICD-10-CM

## 2012-03-13 LAB — REMOTE ICD DEVICE
AL IMPEDENCE ICD: 399 Ohm
ATRIAL PACING ICD: 74.52 pct
BATTERY VOLTAGE: 3.1304 V
LV LEAD IMPEDENCE ICD: 4047 Ohm
TOT-0002: 0
TOT-0006: 20120111000000
TZAT-0001ATACH: 2
TZAT-0001FASTVT: 1
TZAT-0002ATACH: NEGATIVE
TZAT-0002ATACH: NEGATIVE
TZAT-0002ATACH: NEGATIVE
TZAT-0002SLOWVT: NEGATIVE
TZAT-0012ATACH: 150 ms
TZAT-0012FASTVT: 200 ms
TZAT-0018ATACH: NEGATIVE
TZAT-0019ATACH: 6 V
TZAT-0019ATACH: 6 V
TZAT-0019ATACH: 6 V
TZAT-0020ATACH: 1.5 ms
TZAT-0020ATACH: 1.5 ms
TZAT-0020SLOWVT: 1.5 ms
TZON-0003SLOWVT: 360 ms
TZON-0004VSLOWVT: 20
TZST-0001FASTVT: 4
TZST-0001FASTVT: 5
TZST-0001SLOWVT: 3
TZST-0001SLOWVT: 5
TZST-0002ATACH: NEGATIVE
TZST-0002FASTVT: NEGATIVE
TZST-0002FASTVT: NEGATIVE
TZST-0002SLOWVT: NEGATIVE
TZST-0002SLOWVT: NEGATIVE
VENTRICULAR PACING ICD: 99.99 pct

## 2012-04-11 ENCOUNTER — Encounter: Payer: Self-pay | Admitting: *Deleted

## 2012-06-13 ENCOUNTER — Ambulatory Visit (INDEPENDENT_AMBULATORY_CARE_PROVIDER_SITE_OTHER): Payer: BC Managed Care – PPO | Admitting: Internal Medicine

## 2012-06-13 ENCOUNTER — Encounter: Payer: Self-pay | Admitting: Internal Medicine

## 2012-06-13 VITALS — BP 140/90 | HR 63 | Ht 72.0 in | Wt 226.0 lb

## 2012-06-13 DIAGNOSIS — I442 Atrioventricular block, complete: Secondary | ICD-10-CM

## 2012-06-13 DIAGNOSIS — I5022 Chronic systolic (congestive) heart failure: Secondary | ICD-10-CM

## 2012-06-13 DIAGNOSIS — E785 Hyperlipidemia, unspecified: Secondary | ICD-10-CM

## 2012-06-13 DIAGNOSIS — I119 Hypertensive heart disease without heart failure: Secondary | ICD-10-CM

## 2012-06-13 DIAGNOSIS — Z95810 Presence of automatic (implantable) cardiac defibrillator: Secondary | ICD-10-CM

## 2012-06-13 DIAGNOSIS — I428 Other cardiomyopathies: Secondary | ICD-10-CM

## 2012-06-13 LAB — ICD DEVICE OBSERVATION
AL THRESHOLD: 0.5 V
BAMS-0001: 170 {beats}/min
BATTERY VOLTAGE: 3.0972 V
PACEART VT: 0
RV LEAD THRESHOLD: 0.625 V
TOT-0006: 20120111000000
TZAT-0001ATACH: 1
TZAT-0002ATACH: NEGATIVE
TZAT-0002ATACH: NEGATIVE
TZAT-0002ATACH: NEGATIVE
TZAT-0002SLOWVT: NEGATIVE
TZAT-0012ATACH: 150 ms
TZAT-0012FASTVT: 200 ms
TZAT-0012SLOWVT: 200 ms
TZAT-0018ATACH: NEGATIVE
TZAT-0018ATACH: NEGATIVE
TZAT-0018SLOWVT: NEGATIVE
TZAT-0019ATACH: 6 V
TZAT-0019ATACH: 6 V
TZAT-0019ATACH: 6 V
TZAT-0019FASTVT: 8 V
TZAT-0019SLOWVT: 8 V
TZAT-0020ATACH: 1.5 ms
TZAT-0020ATACH: 1.5 ms
TZAT-0020FASTVT: 1.5 ms
TZAT-0020SLOWVT: 1.5 ms
TZON-0003SLOWVT: 360 ms
TZST-0001ATACH: 5
TZST-0001FASTVT: 2
TZST-0001FASTVT: 5
TZST-0001SLOWVT: 5
TZST-0001SLOWVT: 6
TZST-0002ATACH: NEGATIVE
TZST-0002ATACH: NEGATIVE
TZST-0002ATACH: NEGATIVE
TZST-0002FASTVT: NEGATIVE
TZST-0002FASTVT: NEGATIVE
TZST-0002FASTVT: NEGATIVE
TZST-0002SLOWVT: NEGATIVE
TZST-0002SLOWVT: NEGATIVE
TZST-0002SLOWVT: NEGATIVE
VENTRICULAR PACING ICD: 100 pct
VF: 0

## 2012-06-13 NOTE — Assessment & Plan Note (Signed)
Patient has class II symptoms. We will reassess left ventricular function. If it remains depressed we'll anticipate the addition of aldosterone antagonist therapy

## 2012-06-13 NOTE — Patient Instructions (Addendum)
Your physician wants you to follow-up in: 1 year with Dr. Graciela Husbands.  [facility] transmission 09/11/12. You will receive a reminder letter in the mail two months in advance. If you don't receive a letter, please call our office to schedule the follow-up appointment.  Your physician has requested that you have an echocardiogram. Echocardiography is a painless test that uses sound waves to create images of your heart. It provides your doctor with information about the size and shape of your heart and how well your heart's chambers and valves are working. This procedure takes approximately one hour. There are no restrictions for this procedure.

## 2012-06-13 NOTE — Assessment & Plan Note (Signed)
euvolemic  Continue current meds 

## 2012-06-13 NOTE — Assessment & Plan Note (Signed)
Stable post pacing 

## 2012-06-13 NOTE — Assessment & Plan Note (Signed)
The patient's device was interrogated.  The information was reviewed. No changes were made in the programming.    

## 2012-06-13 NOTE — Assessment & Plan Note (Signed)
Blood pressure remains elevated. As above

## 2012-06-13 NOTE — Assessment & Plan Note (Signed)
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 He takes in 1 week on and 1 week off because of cramping. I suggested that he try taking it 3 times a week. I was a for followup to Dr. Dossie [name]

## 2012-06-13 NOTE — Progress Notes (Signed)
  HPI  Ryan [name] is a 62 y.o. male   Seen in followup for an ICD implanted for syncope in the setting of a nonischemic cardiomyopathy. He is now device dependent. He had 6949 lead in place and underwent generator replacement with a new 6947-lead January 2012      Past Medical History  Diagnosis Date  . Nonischemic cardiomyopathy   . Syncope   . Complete heart block   . Paroxysmal atrial fibrillation   . Hypertension   . Dual ICD (implantable cardiac defibrillator-Medtronic     [phone] the implanted originally was 6947 implanted change out 2012  . Diabetes mellitus     Past Surgical History  Procedure Date  . Cardiac defibrillator placement     device generator replacement-January 2012 with insertion of a 6947-lead to replace  6949-lead    Current Outpatient Prescriptions  Medication Sig Dispense Refill  . allopurinol (ZYLOPRIM) 300 MG tablet Take 300 mg by mouth daily.        . APPLE CIDER VINEGAR PO Take by mouth daily.      Marland Kitchen aspirin 81 MG EC tablet Take 81 mg by mouth daily.        Marland Kitchen esomeprazole (NEXIUM) 40 MG capsule Take 40 mg by mouth daily before breakfast.        . nebivolol 20 MG TABS Take 1 tablet (20 mg total) by mouth daily.  30 tablet  6  . pravastatin (PRAVACHOL) 40 MG tablet Take 40 mg by mouth daily.        Marland Kitchen telmisartan-hydrochlorothiazide (MICARDIS HCT) 80-25 MG per tablet Take 1 tablet by mouth daily.  30 tablet  6    Allergies  Allergen Reactions  . Ace Inhibitors     Review of Systems negative except from HPI and PMH  Physical Exam BP 140/90  Pulse 63  Ht 6' (1.829 m)  Wt 226 lb (102.513 kg)  BMI 30.65 kg/m2 Well developed and well nourished in no acute distress HENT normal E scleral and icterus clear Neck Supple JVP flat; carotids brisk and full Clear to ausculation Regular rate and rhythm, no murmurs gallops or rub Soft with active bowel sounds No clubbing cyanosis none Edema Alert and oriented, grossly normal motor and sensory  function Skin Warm and Dry ECG shows P. synchronous pacing Assessment and  Plan

## 2012-07-11 ENCOUNTER — Other Ambulatory Visit: Payer: Self-pay

## 2012-07-11 ENCOUNTER — Other Ambulatory Visit (INDEPENDENT_AMBULATORY_CARE_PROVIDER_SITE_OTHER): Payer: BC Managed Care – PPO

## 2012-07-11 DIAGNOSIS — I119 Hypertensive heart disease without heart failure: Secondary | ICD-10-CM

## 2012-07-11 DIAGNOSIS — I428 Other cardiomyopathies: Secondary | ICD-10-CM

## 2012-07-11 DIAGNOSIS — I5022 Chronic systolic (congestive) heart failure: Secondary | ICD-10-CM

## 2012-07-11 DIAGNOSIS — I442 Atrioventricular block, complete: Secondary | ICD-10-CM

## 2012-07-18 ENCOUNTER — Ambulatory Visit (INDEPENDENT_AMBULATORY_CARE_PROVIDER_SITE_OTHER): Payer: BC Managed Care – PPO

## 2012-07-18 ENCOUNTER — Other Ambulatory Visit: Payer: Self-pay

## 2012-07-18 DIAGNOSIS — I428 Other cardiomyopathies: Secondary | ICD-10-CM

## 2012-07-18 DIAGNOSIS — I429 Cardiomyopathy, unspecified: Secondary | ICD-10-CM

## 2012-07-19 LAB — BASIC METABOLIC PANEL
BUN/Creatinine Ratio: 14 (ref 10–22)
Creatinine, Ser: 1.21 mg/dL (ref 0.76–1.27)
GFR calc non Af Amer: 64 mL/min/{1.73_m2} (ref >59)
Sodium: 140 mmol/L (ref 134–144)

## 2012-09-11 ENCOUNTER — Encounter: Payer: Self-pay | Admitting: Internal Medicine

## 2012-09-11 ENCOUNTER — Ambulatory Visit (INDEPENDENT_AMBULATORY_CARE_PROVIDER_SITE_OTHER): Payer: BC Managed Care – PPO | Admitting: *Deleted

## 2012-09-11 DIAGNOSIS — I428 Other cardiomyopathies: Secondary | ICD-10-CM

## 2012-09-11 DIAGNOSIS — I5022 Chronic systolic (congestive) heart failure: Secondary | ICD-10-CM

## 2012-09-11 DIAGNOSIS — Z95810 Presence of automatic (implantable) cardiac defibrillator: Secondary | ICD-10-CM

## 2012-09-25 LAB — REMOTE ICD DEVICE
AL AMPLITUDE: 1.6 mv
ATRIAL PACING ICD: 66.56 pct
BAMS-0001: 170 {beats}/min
FVT: 0
TZAT-0001ATACH: 3
TZAT-0001FASTVT: 1
TZAT-0002ATACH: NEGATIVE
TZAT-0012ATACH: 150 ms
TZAT-0012ATACH: 150 ms
TZAT-0012SLOWVT: 200 ms
TZAT-0018FASTVT: NEGATIVE
TZAT-0018SLOWVT: NEGATIVE
TZAT-0019ATACH: 6 V
TZAT-0019ATACH: 6 V
TZAT-0019SLOWVT: 8 V
TZAT-0020ATACH: 1.5 ms
TZAT-0020ATACH: 1.5 ms
TZAT-0020SLOWVT: 1.5 ms
TZON-0003SLOWVT: 360 ms
TZON-0003VSLOWVT: 370 ms
TZON-0004SLOWVT: 16
TZST-0001ATACH: 4
TZST-0001ATACH: 5
TZST-0001ATACH: 6
TZST-0001FASTVT: 2
TZST-0001FASTVT: 4
TZST-0001FASTVT: 6
TZST-0001SLOWVT: 2
TZST-0001SLOWVT: 6
TZST-0002ATACH: NEGATIVE
TZST-0002FASTVT: NEGATIVE
TZST-0002SLOWVT: NEGATIVE
TZST-0002SLOWVT: NEGATIVE
TZST-0002SLOWVT: NEGATIVE
VENTRICULAR PACING ICD: 99.99 pct
VF: 0

## 2012-10-17 ENCOUNTER — Encounter: Payer: Self-pay | Admitting: *Deleted

## 2012-10-27 ENCOUNTER — Telehealth: Payer: Self-pay | Admitting: Internal Medicine

## 2012-10-27 NOTE — Telephone Encounter (Signed)
Has a quick question for you

## 2012-10-27 NOTE — Telephone Encounter (Signed)
Patient requesting the name of one of our MD's for a friend recommendation.

## 2012-12-18 ENCOUNTER — Ambulatory Visit (INDEPENDENT_AMBULATORY_CARE_PROVIDER_SITE_OTHER): Payer: BC Managed Care – PPO | Admitting: *Deleted

## 2012-12-18 DIAGNOSIS — I428 Other cardiomyopathies: Secondary | ICD-10-CM

## 2012-12-18 DIAGNOSIS — Z95810 Presence of automatic (implantable) cardiac defibrillator: Secondary | ICD-10-CM

## 2012-12-26 LAB — REMOTE ICD DEVICE
AL IMPEDENCE ICD: 399 Ohm
BATTERY VOLTAGE: 3.0767 V
CHARGE TIME: 9.649 s
LV LEAD IMPEDENCE ICD: 4047 Ohm
PACEART VT: 0
RV LEAD THRESHOLD: 0.5 V
TOT-0001: 1
TOT-0002: 0
TOT-0006: 20120111000000
TZAT-0001ATACH: 1
TZAT-0001ATACH: 2
TZAT-0001FASTVT: 1
TZAT-0002ATACH: NEGATIVE
TZAT-0002ATACH: NEGATIVE
TZAT-0002FASTVT: NEGATIVE
TZAT-0002SLOWVT: NEGATIVE
TZAT-0012FASTVT: 200 ms
TZAT-0018ATACH: NEGATIVE
TZAT-0018ATACH: NEGATIVE
TZAT-0019ATACH: 6 V
TZAT-0019ATACH: 6 V
TZAT-0019SLOWVT: 8 V
TZAT-0020ATACH: 1.5 ms
TZON-0004VSLOWVT: 20
TZON-0005SLOWVT: 12
TZST-0001FASTVT: 3
TZST-0001FASTVT: 4
TZST-0001FASTVT: 5
TZST-0001SLOWVT: 3
TZST-0001SLOWVT: 5
TZST-0002ATACH: NEGATIVE
TZST-0002FASTVT: NEGATIVE
TZST-0002FASTVT: NEGATIVE
TZST-0002FASTVT: NEGATIVE
TZST-0002SLOWVT: NEGATIVE
TZST-0002SLOWVT: NEGATIVE
TZST-0002SLOWVT: NEGATIVE
TZST-0002SLOWVT: NEGATIVE
VENTRICULAR PACING ICD: 99.98 pct

## 2013-01-26 ENCOUNTER — Encounter: Payer: Self-pay | Admitting: *Deleted

## 2013-02-06 ENCOUNTER — Encounter: Payer: Self-pay | Admitting: Internal Medicine

## 2013-03-26 ENCOUNTER — Ambulatory Visit (INDEPENDENT_AMBULATORY_CARE_PROVIDER_SITE_OTHER): Payer: BC Managed Care – PPO | Admitting: *Deleted

## 2013-03-26 ENCOUNTER — Encounter: Payer: Self-pay | Admitting: Internal Medicine

## 2013-03-26 DIAGNOSIS — Z95810 Presence of automatic (implantable) cardiac defibrillator: Secondary | ICD-10-CM

## 2013-03-26 DIAGNOSIS — I5022 Chronic systolic (congestive) heart failure: Secondary | ICD-10-CM

## 2013-03-26 DIAGNOSIS — I428 Other cardiomyopathies: Secondary | ICD-10-CM

## 2013-03-26 LAB — MDC_IDC_ENUM_SESS_TYPE_REMOTE
Brady Statistic AP VS Percent: 0.02 %
Brady Statistic AS VS Percent: 0.02 %
Brady Statistic RA Percent Paced: 74.1 %
Date Time Interrogation Session: 20141117092509
HighPow Impedance: 44 Ohm
Lead Channel Impedance Value: 4047 Ohm
Lead Channel Impedance Value: 4047 Ohm
Lead Channel Impedance Value: 646 Ohm
Lead Channel Pacing Threshold Amplitude: 0.5 V
Lead Channel Pacing Threshold Amplitude: 0.625 V
Lead Channel Pacing Threshold Pulse Width: 0.4 ms
Lead Channel Sensing Intrinsic Amplitude: 1.625 mV
Zone Setting Detection Interval: 300 ms
Zone Setting Detection Interval: 360 ms

## 2013-04-17 ENCOUNTER — Encounter: Payer: Self-pay | Admitting: *Deleted

## 2013-06-19 ENCOUNTER — Encounter: Payer: Self-pay | Admitting: Internal Medicine

## 2013-06-19 ENCOUNTER — Ambulatory Visit (INDEPENDENT_AMBULATORY_CARE_PROVIDER_SITE_OTHER): Payer: BC Managed Care – PPO | Admitting: Internal Medicine

## 2013-06-19 VITALS — BP 136/90 | HR 63 | Ht 73.0 in | Wt 222.2 lb

## 2013-06-19 DIAGNOSIS — I442 Atrioventricular block, complete: Secondary | ICD-10-CM

## 2013-06-19 DIAGNOSIS — I428 Other cardiomyopathies: Secondary | ICD-10-CM | POA: Diagnosis not present

## 2013-06-19 DIAGNOSIS — I5022 Chronic systolic (congestive) heart failure: Secondary | ICD-10-CM | POA: Diagnosis not present

## 2013-06-19 LAB — MDC_IDC_ENUM_SESS_TYPE_INCLINIC
Battery Voltage: 3.05 V
Brady Statistic AP VP Percent: 70.72 %
Brady Statistic AP VS Percent: 0.01 %
Brady Statistic RA Percent Paced: 70.73 %
Brady Statistic RV Percent Paced: 99.98 %
Date Time Interrogation Session: 20150210112335
HIGH POWER IMPEDANCE MEASURED VALUE: 190 Ohm
HIGH POWER IMPEDANCE MEASURED VALUE: 58 Ohm
HighPow Impedance: 46 Ohm
HighPow Impedance: 589 Ohm
Lead Channel Impedance Value: 4047 Ohm
Lead Channel Impedance Value: 4047 Ohm
Lead Channel Impedance Value: 703 Ohm
Lead Channel Pacing Threshold Amplitude: 0.75 V
Lead Channel Pacing Threshold Amplitude: 0.75 V
Lead Channel Pacing Threshold Pulse Width: 0.4 ms
Lead Channel Sensing Intrinsic Amplitude: 2.25 mV
Lead Channel Setting Pacing Amplitude: 2 V
Lead Channel Setting Pacing Pulse Width: 0.4 ms
Lead Channel Setting Sensing Sensitivity: 0.3 mV
MDC IDC MSMT LEADCHNL LV IMPEDANCE VALUE: 4047 Ohm
MDC IDC MSMT LEADCHNL RA IMPEDANCE VALUE: 399 Ohm
MDC IDC MSMT LEADCHNL RA PACING THRESHOLD PULSEWIDTH: 0.4 ms
MDC IDC MSMT LEADCHNL RA SENSING INTR AMPL: 1.625 mV
MDC IDC SET LEADCHNL RA PACING AMPLITUDE: 2 V
MDC IDC SET ZONE DETECTION INTERVAL: 300 ms
MDC IDC STAT BRADY AS VP PERCENT: 29.25 %
MDC IDC STAT BRADY AS VS PERCENT: 0.01 %
Zone Setting Detection Interval: 350 ms
Zone Setting Detection Interval: 360 ms
Zone Setting Detection Interval: 370 ms

## 2013-06-19 NOTE — Patient Instructions (Signed)
Remote monitoring is used to monitor your Pacemaker of ICD from home. This monitoring reduces the number of office visits required to check your device to one time per year. It allows [place] to keep an eye on the functioning of your device to ensure it is working properly. You are scheduled for a device check from home on 09/20/13. You may send your transmission at any time that day. If you have a wireless device, the transmission will be sent automatically. After your physician reviews your transmission, you will receive a postcard with your next transmission date.   Your physician wants you to follow-up in: 1 year.  You will receive a reminder letter in the mail two months in advance. If you don't receive a letter, please call our office to schedule the follow-up appointment.

## 2013-06-19 NOTE — Progress Notes (Signed)
skf      Patient Care Team: Golden Pop, MD as PCP - General (Unknown Physician Specialty)   HPI  Ryan [name] is a 63 y.o. male Seen in followup for an ICD implanted for syncope in the setting of a nonischemic cardiomyopathy. He is now device dependent. He had 6949 lead in place and underwent generator replacement with a new 6947-lead January 2012  The patient denies chest pain, shortness of breath, nocturnal dyspnea, orthopnea or peripheral edema.  There have been no palpitations, lightheadedness or syncope.   He says laboratories are checked by his PCP  Past Medical History  Diagnosis Date  . Nonischemic cardiomyopathy   . Syncope   . Complete heart block   . Paroxysmal atrial fibrillation   . Hypertension   . Dual ICD (implantable cardiac defibrillator-Medtronic     [phone] the implanted originally was 6947 implanted change out 2012  . Diabetes mellitus     Past Surgical History  Procedure Laterality Date  . Cardiac defibrillator placement      device generator replacement-January 2012 with insertion of a 6947-lead to replace  6949-lead    Current Outpatient Prescriptions  Medication Sig Dispense Refill  . aspirin 81 MG EC tablet Take 81 mg by mouth daily.        Marland Kitchen esomeprazole (NEXIUM) 40 MG capsule Take 40 mg by mouth daily before breakfast.        . nebivolol (BYSTOLIC) 10 MG tablet Take 20 mg by mouth daily.      . NESINA 12.5 MG TABS daily.       . pravastatin (PRAVACHOL) 40 MG tablet Take 20 mg by mouth daily.       Marland Kitchen telmisartan-hydrochlorothiazide (MICARDIS HCT) 80-25 MG per tablet Take 1 tablet by mouth daily.  30 tablet  6   No current facility-administered medications for this visit.    Allergies  Allergen Reactions  . Ace Inhibitors     Review of Systems negative except from HPI and PMH  Physical Exam BP 136/90  Pulse 63  Ht 6\' 1"  (1.854 m)  Wt 222 lb 4 oz (100.812 kg)  BMI 29.33 kg/m2 Well developed and nourished in no acute distress HENT  normal Neck supple with JVP-flat Clear Regular rate and rhythm, no murmurs or gallops Abd-soft with active BS No Clubbing cyanosis edema Skin-warm and dry A & Oriented  Grossly normal sensory and motor function Device pocket well healed; without hematoma or erythema.  There is no tethering    Assessment and  Plan  Nonischemic cardiomyopathy continue current medications. Class 1-2 symptoms no indication for Aldactone  Complete heart block stable post pacing  Paroxysmal atrial fibrillation no intercurrent atrial fibrillation on aspirin  Status post ICD-Medtronic LV port plugged The patient's device was interrogated.  The information was reviewed. No changes were made in the programming.     Chronic systolic heart failure  euvoleimic

## 2013-07-02 ENCOUNTER — Encounter: Payer: Self-pay | Admitting: Internal Medicine

## 2013-09-04 ENCOUNTER — Encounter: Payer: Self-pay | Admitting: Internal Medicine

## 2013-09-04 ENCOUNTER — Ambulatory Visit (INDEPENDENT_AMBULATORY_CARE_PROVIDER_SITE_OTHER): Payer: BC Managed Care – PPO | Admitting: Internal Medicine

## 2013-09-04 VITALS — BP 128/80 | HR 76 | Ht 73.0 in | Wt 216.6 lb

## 2013-09-04 DIAGNOSIS — K219 Gastro-esophageal reflux disease without esophagitis: Secondary | ICD-10-CM

## 2013-09-04 MED ORDER — RANITIDINE HCL 300 MG PO TABS: 300.0000 mg | ORAL_TABLET | ORAL | Status: DC

## 2013-09-04 NOTE — Patient Instructions (Addendum)
You have been scheduled for an endoscopy with propofol. Please follow written instructions given to you at your visit today. If you use inhalers (even only as needed), please bring them with you on the day of your procedure. Your physician has requested that you go to www.startemmi.com and enter the access code given to you at your visit today. This web site gives a general overview about your procedure. However, you should still follow specific instructions given to you by our office regarding your preparation for the procedure.  We have sent the following medications to your pharmacy for you to pick up at your convenience: Ranitidine 300 mg every morning  Start taking Dexilant at night instead of in the mornings.  CC:Dr [employer], Dr Caryl Comes

## 2013-09-04 NOTE — Progress Notes (Signed)
Ryan [name] 1950/10/06 [id number]  Note: This dictation was prepared with Dragon digital system. Any transcriptional errors that result from this procedure are unintentional.   History of Present Illness:  This is a 63 year old white male with non-ischemic cardiomyopathy and ejection fraction of 45-50%. He has complete heart block and chronic systolic  heart failure currently under good control. He has an ICD defibrillator and is followed by Dr. Caryl Comes  who sees patient once a year. He is here today because of a several month history of hoarseness and nocturnal cough. He has a history of gastroesophageal reflux for many years for which he has taken Prilosec and later on Nexium for about 12 years. He was recently switched to Dexilant 60 mg daily which has helped 100% but his hoarseness and cough continue. He denies dysphagia. He drinks one beer a day. He does not use any anti-inflammatory agents and he does not smoke. He is a borderline diabetic. After supper each day, he lays down and sleeps for about an hour. He exercises regularly and has actually lost about 15 pounds recently. He reports that his brother and father both had similar symptoms.    Past Medical History  Diagnosis Date  . Nonischemic cardiomyopathy   . Syncope   . Complete heart block   . Paroxysmal atrial fibrillation   . Hypertension   . Dual ICD (implantable cardiac defibrillator-Medtronic     [phone] the implanted originally was 6947 implanted change out 2012  . Diabetes mellitus   . Kidney stones     Past Surgical History  Procedure Laterality Date  . Cardiac defibrillator placement    . Lithotripsy      Allergies  Allergen Reactions  . Ace Inhibitors     Family history and social history have been reviewed.  Review of Systems: Negative for dysphagia. Positive for gastroesophageal reflux  The remainder of the 10 point ROS is negative except as outlined in the H&P  Physical Exam: General Appearance Well  developed, in no distress Eyes  Non icteric  HEENT  Non traumatic, normocephalic  Mouth No lesion, tongue papillated, no cheilosis he has hoarseness of his 40s. No cough Neck Supple without adenopathy, thyroid not enlarged, no carotid bruits, no JVD Lungs Clear to auscultation bilaterally COR Normal S1, normal S2, regular rhythm, no murmur, quiet precordium ICD left anterior precordium Abdomen soft nontender with normoactive bowel sounds. Liver edge at costal margin Rectal not done Extremities  No pedal edema Skin No lesions Neurological Alert and oriented x 3 Psychological Normal mood and affect  Assessment and Plan:   Problem #1 Recent onset of hoarseness and nocturnal cough suggestive of nocturnal reflux. He has been markedly improved on Dexilant but he still continues to have hoarseness. I suspect he has chronic nocturnal reflux with possible LPR. We will switch  Dexilant to an evening dose and take ranitidine 300 mg in the morning. He will exercise strict antireflux measures which means that he cannot lie down right after supper. We have discussed that at length. We will schedule him for an upper endoscopy to rule out Barrett's esophagus. He will continue his exercise program. He may need an ENT evaluation of his hoarseness, specifically his vocal cords .  Problem #2 Nonischemic cardiomyopathy. This is currently very stable. His ejection fraction is 45-50%. He is followed by Dr.Klein for complete heart block. He is on no anticoagulants other than aspirin 81 mg daily.   Lafayette Dragon 09/04/2013

## 2013-09-05 ENCOUNTER — Encounter: Payer: Self-pay | Admitting: Internal Medicine

## 2013-09-19 ENCOUNTER — Encounter: Payer: Self-pay | Admitting: Internal Medicine

## 2013-09-19 ENCOUNTER — Ambulatory Visit (AMBULATORY_SURGERY_CENTER): Payer: BC Managed Care – PPO | Admitting: Internal Medicine

## 2013-09-19 VITALS — BP 122/82 | HR 60 | Temp 97.5°F | Resp 15 | Ht 73.0 in | Wt 216.0 lb

## 2013-09-19 DIAGNOSIS — K2100 Gastro-esophageal reflux disease with esophagitis, without bleeding: Secondary | ICD-10-CM

## 2013-09-19 DIAGNOSIS — K2960 Other gastritis without bleeding: Secondary | ICD-10-CM

## 2013-09-19 DIAGNOSIS — K219 Gastro-esophageal reflux disease without esophagitis: Secondary | ICD-10-CM

## 2013-09-19 DIAGNOSIS — K2940 Chronic atrophic gastritis without bleeding: Secondary | ICD-10-CM

## 2013-09-19 LAB — GLUCOSE, CAPILLARY
Glucose-Capillary: 145 mg/dL — ABNORMAL HIGH (ref 70–99)
Glucose-Capillary: 138 mg/dL — ABNORMAL HIGH (ref 70–99)

## 2013-09-19 MED ORDER — SODIUM CHLORIDE 0.9 % IV SOLN: 500.0000 mL | INTRAVENOUS | Status: DC

## 2013-09-19 NOTE — Progress Notes (Signed)
Report to pacu rn, vss, bbs=clear 

## 2013-09-19 NOTE — Patient Instructions (Addendum)
YOU HAD AN ENDOSCOPIC PROCEDURE TODAY AT THE [facility]: Refer to the procedure report that was given to you for any specific questions about what was found during the examination.  If the procedure report does not answer your questions, please call your gastroenterologist to clarify.  If you requested that your care partner not be given the details of your procedure findings, then the procedure report has been included in a sealed envelope for you to review at your convenience later.  YOU SHOULD EXPECT: Some feelings of bloating in the abdomen. Passage of more gas than usual.  Walking can help get rid of the air that was put into your GI tract during the procedure and reduce the bloating. If you had a lower endoscopy (such as a colonoscopy or flexible sigmoidoscopy) you may notice spotting of blood in your stool or on the toilet paper. If you underwent a bowel prep for your procedure, then you may not have a normal bowel movement for a few days.  DIET: Your first meal following the procedure should be a light meal and then it is ok to progress to your normal diet.  A half-sandwich or bowl of soup is an example of a good first meal.  Heavy or fried foods are harder to digest and may make you feel nauseous or bloated.  Likewise meals heavy in dairy and vegetables can cause extra gas to form and this can also increase the bloating.  Drink plenty of fluids but you should avoid alcoholic beverages for 24 hours.  ACTIVITY: Your care partner should take you home directly after the procedure.  You should plan to take it easy, moving slowly for the rest of the day.  You can resume normal activity the day after the procedure however you should NOT DRIVE or use heavy machinery for 24 hours (because of the sedation medicines used during the test).    SYMPTOMS TO REPORT IMMEDIATELY: A gastroenterologist can be reached at any hour.  During normal business hours, 8:30 AM to 5:00 PM Monday through Friday,  call [phone].  After hours and on weekends, please call the GI answering service at [phone] who will take a message and have the physician on call contact you.   Following lower endoscopy (colonoscopy or flexible sigmoidoscopy):  Excessive amounts of blood in the stool  Significant tenderness or worsening of abdominal pains  Swelling of the abdomen that is new, acute  Fever of 100F or higher   FOLLOW UP: If any biopsies were taken you will be contacted by phone or by letter within the next 1-3 weeks.  Call your gastroenterologist if you have not heard about the biopsies in 3 weeks.  Our staff will call the home number listed on your records the next business day following your procedure to check on you and address any questions or concerns that you may have at that time regarding the information given to you following your procedure. This is a courtesy call and so if there is no answer at the home number and we have not heard from you through the emergency physician on call, we will assume that you have returned to your regular daily activities without incident.  SIGNATURES/CONFIDENTIALITY: You and/or your care partner have signed paperwork which will be entered into your electronic medical record.  These signatures attest to the fact that that the information above on your After Visit Summary has been reviewed and is understood.  Full responsibility of the confidentiality of   this discharge information lies with you and/or your care-partner.  Esophagitis and anti reflux regimen information given.  Continue Dexilant 60 mg. Everyday and ranitidine 300mg  every morning.YOU HAD AN ENDOSCOPIC PROCEDURE TODAY AT [facility]: Refer to the procedure report that was given to you for any specific questions about what was found during the examination.  If the procedure report does not answer your questions, please call your gastroenterologist to clarify.  If you requested that  your care partner not be given the details of your procedure findings, then the procedure report has been included in a sealed envelope for you to review at your convenience later.  YOU SHOULD EXPECT: Some feelings of bloating in the abdomen. Passage of more gas than usual.  Walking can help get rid of the air that was put into your GI tract during the procedure and reduce the bloating. If you had a lower endoscopy (such as a colonoscopy or flexible sigmoidoscopy) you may notice spotting of blood in your stool or on the toilet paper. If you underwent a bowel prep for your procedure, then you may not have a normal bowel movement for a few days.  DIET: Your first meal following the procedure should be a light meal and then it is ok to progress to your normal diet.  A half-sandwich or bowl of soup is an example of a good first meal.  Heavy or fried foods are harder to digest and may make you feel nauseous or bloated.  Likewise meals heavy in dairy and vegetables can cause extra gas to form and this can also increase the bloating.  Drink plenty of fluids but you should avoid alcoholic beverages for 24 hours.  ACTIVITY: Your care partner should take you home directly after the procedure.  You should plan to take it easy, moving slowly for the rest of the day.  You can resume normal activity the day after the procedure however you should NOT DRIVE or use heavy machinery for 24 hours (because of the sedation medicines used during the test).    SYMPTOMS TO REPORT IMMEDIATELY: A gastroenterologist can be reached at any hour.  During normal business hours, 8:30 AM to 5:00 PM Monday through Friday, call [phone].  After hours and on weekends, please call the GI answering service at [phone] who will take a message and have the physician on call contact you.   Following upper endoscopy (EGD)  Vomiting of blood or coffee ground material  New chest pain or pain under the shoulder blades  Painful or  persistently difficult swallowing  New shortness of breath  Fever of 100F or higher  Black, tarry-looking stools  FOLLOW UP: If any biopsies were taken you will be contacted by phone or by letter within the next 1-3 weeks.  Call your gastroenterologist if you have not heard about the biopsies in 3 weeks.  Our staff will call the home number listed on your records the next business day following your procedure to check on you and address any questions or concerns that you may have at that time regarding the information given to you following your procedure. This is a courtesy call and so if there is no answer at the home number and we have not heard from you through the emergency physician on call, we will assume that you have returned to your regular daily activities without incident.  SIGNATURES/CONFIDENTIALITY: You and/or your care partner have signed paperwork which will be entered into your electronic  medical record.  These signatures attest to the fact that that the information above on your After Visit Summary has been reviewed and is understood.  Full responsibility of the confidentiality of this discharge information lies with you and/or your care-partner.

## 2013-09-19 NOTE — Op Note (Signed)
[facility]  [address] [place], [id number]   ENDOSCOPY PROCEDURE REPORT  PATIENT: Ryan, [name]  MR#: [id number] BIRTHDATE: [birth year] , 62  yrs. old GENDER: Male ENDOSCOPIST: Lafayette Dragon, MD REFERRED BY:  Dr Kelli Churn PROCEDURE DATE:  09/19/2013 PROCEDURE:  EGD w/ biopsy ASA CLASS:     Class III INDICATIONS:  Heartburn.   nocturnal cough and hoarseness improved on Dexilant and Zantac 300 mg in am. MEDICATIONS: MAC sedation, administered by CRNA and Fentanyl-Detailed 130 mg IV TOPICAL ANESTHETIC: Cetacaine Spray  DESCRIPTION OF PROCEDURE: After the risks benefits and alternatives of the procedure were thoroughly explained, informed consent was obtained.  The LB [id number] endoscope was introduced through the mouth and advanced to the second portion of the duodenum. Without limitations.  The instrument was slowly withdrawn as the mucosa was fully examined.      Esophagus, proximal and mid esophageal mucosa appeared normal. The Z line was irregular. There was no stricture. There was some erythema at the Z line consistent with grade a esophagitis. There were no discrete erosions. Multiple biopsies were taken from Z line to rule out Barrett's esophagus. There was no significant hiatal hernia Stomach: Gastric mucosa was intensely erythematous and somewhat reticulated. Biopsies were taken to rule out H. pylori. Pyloric outlet was normal. Retroflexion of the endoscope revealed normal fundus and cardia Duodenum: Descending duodenum and duodenal bulb were normal[ The scope was then withdrawn from the patient and the procedure completed.  COMPLICATIONS: There were no complications. ENDOSCOPIC IMPRESSION: grade 1 esophagitis. Status post biopsies to rule out Barrett's esophagus Mild antral gastritis. Status post biopsies to rule out H. pylori RECOMMENDATIONS: 1.  Await biopsy results 2.  Anti-reflux regimen to be follow 3.  continue  Dexilant 60 mg qd and ranitidine 300 mg every morning  REPEAT EXAM: for EGD pending biopsy results.  eSigned:  Lafayette Dragon, MD 09/19/2013 10:13 AM   CC:  PATIENT NAME:  Ryan, [name] MR#: [id number]

## 2013-09-19 NOTE — Progress Notes (Signed)
Called to room to assist during endoscopic procedure.  Patient ID and intended procedure confirmed with present staff. Received instructions for my participation in the procedure from the performing physician.  

## 2013-09-20 ENCOUNTER — Telehealth: Payer: Self-pay | Admitting: *Deleted

## 2013-09-20 ENCOUNTER — Encounter: Payer: BC Managed Care – PPO | Admitting: *Deleted

## 2013-09-20 ENCOUNTER — Telehealth: Payer: Self-pay | Admitting: Cardiology

## 2013-09-20 NOTE — Telephone Encounter (Signed)
LMOVM reminding pt to send remote transmission.   

## 2013-09-20 NOTE — Telephone Encounter (Signed)
  Follow up Call-  Call back number 09/19/2013  Post procedure Call Back phone  # [phone]  Permission to leave phone message No     Patient questions:  Do you have a fever, pain , or abdominal swelling? no Pain Score  0 *  Have you tolerated food without any problems? yes  Have you been able to return to your normal activities? yes  Do you have any questions about your discharge instructions: Diet   no Medications  no Follow up visit  no  Do you have questions or concerns about your Care? no  Actions: * If pain score is 4 or above: No action needed, pain <4.

## 2013-09-21 ENCOUNTER — Encounter: Payer: Self-pay | Admitting: Cardiology

## 2013-09-24 ENCOUNTER — Encounter: Payer: Self-pay | Admitting: Internal Medicine

## 2013-09-25 ENCOUNTER — Encounter: Payer: Self-pay | Admitting: *Deleted

## 2013-10-25 ENCOUNTER — Other Ambulatory Visit: Payer: Self-pay | Admitting: *Deleted

## 2013-10-25 MED ORDER — RANITIDINE HCL 300 MG PO TABS: 300.0000 mg | ORAL_TABLET | ORAL | Status: DC

## 2014-02-19 ENCOUNTER — Encounter: Payer: Self-pay | Admitting: Cardiology

## 2014-02-26 ENCOUNTER — Telehealth: Payer: Self-pay | Admitting: Cardiology

## 2014-02-26 NOTE — Telephone Encounter (Signed)
Pt to call back later to receive help with sending transmission.

## 2014-02-28 ENCOUNTER — Encounter: Payer: Self-pay | Admitting: Internal Medicine

## 2014-02-28 ENCOUNTER — Ambulatory Visit (INDEPENDENT_AMBULATORY_CARE_PROVIDER_SITE_OTHER): Payer: BC Managed Care – PPO | Admitting: *Deleted

## 2014-02-28 DIAGNOSIS — I5022 Chronic systolic (congestive) heart failure: Secondary | ICD-10-CM

## 2014-02-28 DIAGNOSIS — I429 Cardiomyopathy, unspecified: Secondary | ICD-10-CM

## 2014-02-28 NOTE — Progress Notes (Signed)
Remote ICD transmission.   

## 2014-03-01 LAB — MDC_IDC_ENUM_SESS_TYPE_REMOTE
Battery Voltage: 3.02 V
Brady Statistic AS VP Percent: 22.11 %
Brady Statistic AS VS Percent: 0 %
Brady Statistic RA Percent Paced: 77.89 %
Date Time Interrogation Session: 20151022192828
HIGH POWER IMPEDANCE MEASURED VALUE: 53 Ohm
HIGH POWER IMPEDANCE MEASURED VALUE: 627 Ohm
HighPow Impedance: 69 Ohm
Lead Channel Impedance Value: 4047 Ohm
Lead Channel Impedance Value: 4047 Ohm
Lead Channel Impedance Value: 4047 Ohm
Lead Channel Impedance Value: 494 Ohm
Lead Channel Impedance Value: 722 Ohm
Lead Channel Pacing Threshold Amplitude: 0.5 V
Lead Channel Pacing Threshold Amplitude: 0.625 V
Lead Channel Pacing Threshold Pulse Width: 0.4 ms
Lead Channel Sensing Intrinsic Amplitude: 1.625 mV
Lead Channel Setting Pacing Amplitude: 2 V
Lead Channel Setting Pacing Amplitude: 2 V
Lead Channel Setting Pacing Pulse Width: 0.4 ms
Lead Channel Setting Sensing Sensitivity: 0.3 mV
MDC IDC MSMT LEADCHNL RA SENSING INTR AMPL: 1.625 mV
MDC IDC MSMT LEADCHNL RV PACING THRESHOLD PULSEWIDTH: 0.4 ms
MDC IDC SET ZONE DETECTION INTERVAL: 300 ms
MDC IDC STAT BRADY AP VP PERCENT: 77.88 %
MDC IDC STAT BRADY AP VS PERCENT: 0 %
MDC IDC STAT BRADY RV PERCENT PACED: 99.99 %
Zone Setting Detection Interval: 350 ms
Zone Setting Detection Interval: 360 ms
Zone Setting Detection Interval: 370 ms

## 2014-03-05 ENCOUNTER — Telehealth: Payer: Self-pay | Admitting: Internal Medicine

## 2014-03-05 NOTE — Telephone Encounter (Signed)
Pt is calling to see if we received his transmission on his device, please call patient back. He states he spoke to someone Friday and manually sent on in, he is just making sure about this.

## 2014-03-06 NOTE — Telephone Encounter (Signed)
Left message for patient that transmission was received 03/01/15.

## 2014-03-13 ENCOUNTER — Encounter: Payer: Self-pay | Admitting: Cardiology

## 2014-06-18 ENCOUNTER — Ambulatory Visit (INDEPENDENT_AMBULATORY_CARE_PROVIDER_SITE_OTHER): Payer: BLUE CROSS/BLUE SHIELD | Admitting: Internal Medicine

## 2014-06-18 ENCOUNTER — Encounter: Payer: Self-pay | Admitting: Internal Medicine

## 2014-06-18 VITALS — BP 110/74 | HR 70 | Ht 72.5 in | Wt 212.5 lb

## 2014-06-18 DIAGNOSIS — I5022 Chronic systolic (congestive) heart failure: Secondary | ICD-10-CM | POA: Diagnosis not present

## 2014-06-18 DIAGNOSIS — Z95810 Presence of automatic (implantable) cardiac defibrillator: Secondary | ICD-10-CM

## 2014-06-18 DIAGNOSIS — I442 Atrioventricular block, complete: Secondary | ICD-10-CM | POA: Diagnosis not present

## 2014-06-18 LAB — MDC_IDC_ENUM_SESS_TYPE_INCLINIC
Battery Voltage: 2.99 V
Brady Statistic AP VP Percent: 78.4 %
Brady Statistic AS VS Percent: 0.1 % — CL
Lead Channel Impedance Value: 646 Ohm
Lead Channel Pacing Threshold Amplitude: 0.5 V
Lead Channel Pacing Threshold Amplitude: 0.5 V
Lead Channel Pacing Threshold Pulse Width: 0.4 ms
Lead Channel Pacing Threshold Pulse Width: 0.4 ms
Lead Channel Sensing Intrinsic Amplitude: 1.5 mV
Lead Channel Setting Pacing Pulse Width: 0.4 ms
Lead Channel Setting Sensing Sensitivity: 0.3 mV
MDC IDC MSMT LEADCHNL RA IMPEDANCE VALUE: 437 Ohm
MDC IDC SET LEADCHNL RA PACING AMPLITUDE: 2 V
MDC IDC SET LEADCHNL RV PACING AMPLITUDE: 2 V
MDC IDC SET ZONE DETECTION INTERVAL: 350 ms
MDC IDC SET ZONE DETECTION INTERVAL: 370 ms
MDC IDC STAT BRADY AP VS PERCENT: 0.1 % — AB
MDC IDC STAT BRADY AS VP PERCENT: 21.6 %
Zone Setting Detection Interval: 300 ms
Zone Setting Detection Interval: 360 ms

## 2014-06-18 MED ORDER — TELMISARTAN 80 MG PO TABS: 80.0000 mg | ORAL_TABLET | Freq: Every day | ORAL | Status: DC

## 2014-06-18 NOTE — Progress Notes (Signed)
skf      Patient Care Team: Golden Pop, MD as PCP - General (Unknown Physician Specialty)   HPI  Ryan [name] is a 64 y.o. male Seen in followup for an ICD implanted for syncope in the setting of a nonischemic cardiomyopathy. He is now device dependent. He had 6949 lead in place and underwent generator replacement with a new 6947-lead January 2012  The patient denies chest pain, shortness of breath, nocturnal dyspnea, orthopnea or peripheral edema.  There have been no palpitations, lightheadedness or syncope.   He says laboratories are checked by his PCP  Echocardiogram 3/14 demonstrated ejection fraction of 40-45% with significant left atrial enlargement  Past Medical History  Diagnosis Date  . Nonischemic cardiomyopathy   . Syncope   . Complete heart block   . Paroxysmal atrial fibrillation   . Hypertension   . Dual ICD (implantable cardiac defibrillator-Medtronic     [id number] the implanted originally was 6947 implanted change out 2012  . Diabetes mellitus   . Kidney stones     Past Surgical History  Procedure Laterality Date  . Cardiac defibrillator placement    . Lithotripsy    . Upper endoscopy w/ esophageal manometry      Current Outpatient Prescriptions  Medication Sig Dispense Refill  . aspirin 81 MG EC tablet Take 81 mg by mouth daily.      Marland Kitchen DEXILANT 60 MG capsule Take 60 mg by mouth daily.     Marland Kitchen esomeprazole (NEXIUM) 40 MG capsule Take 40 mg by mouth daily at 12 noon.    Marland Kitchen FARXIGA 10 MG TABS Take 10 mg by mouth daily.     . nebivolol (BYSTOLIC) 10 MG tablet Take 20 mg by mouth daily.    . NESINA 12.5 MG TABS daily.     Marland Kitchen telmisartan-hydrochlorothiazide (MICARDIS HCT) 80-25 MG per tablet Take 1 tablet by mouth daily. 30 tablet 6  . ULORIC 40 MG tablet Take 40 mg by mouth daily.      No current facility-administered medications for this visit.    Allergies  Allergen Reactions  . Ace Inhibitors     Review of Systems negative except from HPI and  PMH  Physical Exam BP 110/74 mmHg  Pulse 70  Ht 6' 0.5" (1.842 m)  Wt 212 lb 8 oz (96.389 kg)  BMI 28.41 kg/m2 Well developed and nourished in no acute distress HENT normal Neck supple with JVP-flat Clear Regular rate and rhythm, no murmurs or gallops Abd-soft with active BS No Clubbing cyanosis edema Skin-warm and dry A & Oriented  Grossly normal sensory and motor function Device pocket well healed; without hematoma or erythema.  There is no tethering    Assessment and  Plan  Nonischemic cardiomyopathy continue current medications. Class 1-2 symptoms no indication for Aldactone  Complete heart block stable post pacing  Paroxysmal atrial fibrillation no intercurrent atrial fibrillation on aspirin  Status post ICD-Medtronic LV port plugged The patient's device was interrogated.  The information was reviewed. No changes were made in the programming.     Chronic systolic heart failure  euvoleimic  Statin intolerance  Hypertension  His blood pressure is much improved with interval weight loss.  We will discontinue the HCTZ. We will continue his ARB and his beta blocker.  He is statin intolerant but in the context of nonischemic heart disease it is less important

## 2014-06-18 NOTE — Patient Instructions (Addendum)
Your physician has recommended you make the following change in your medication:  Stop Micardis HCT  Start Micardis 80 mg once daily   Your physician wants you to follow-up in: 1 year with Dr. Gari Crown will receive a reminder letter in the mail two months in advance. If you don't receive a letter, please call our office to schedule the follow-up appointment.   Remote monitoring is used to monitor your Pacemaker of ICD from home. This monitoring reduces the number of office visits required to check your device to one time per year. It allows [place] to keep an eye on the functioning of your device to ensure it is working properly. You are scheduled for a device check from home on 09/17/14. You may send your transmission at any time that day. If you have a wireless device, the transmission will be sent automatically. After your physician reviews your transmission, you will receive a postcard with your next transmission date.

## 2014-06-24 ENCOUNTER — Encounter: Payer: Self-pay | Admitting: Cardiovascular Disease

## 2014-07-04 ENCOUNTER — Telehealth: Payer: Self-pay

## 2014-07-04 NOTE — Telephone Encounter (Signed)
Patients insurance denied his Bystolic They stated he must be on coreg, atenolol or metoprolol before trying bystolic  Please advise

## 2014-07-04 NOTE — Telephone Encounter (Signed)
Nurse with [facility] has a question regarding Bystolic. They are having a hard time getting it approved, and wanted to discuss this with someone. Please call. Before 12. States pt is completely out

## 2014-07-08 ENCOUNTER — Telehealth: Payer: Self-pay | Admitting: *Deleted

## 2014-07-08 MED ORDER — METOPROLOL SUCCINATE ER 50 MG PO TB24: 50.0000 mg | ORAL_TABLET | Freq: Every day | ORAL | Status: DC

## 2014-07-08 NOTE — Telephone Encounter (Signed)
We can try carvedlol or metoprolol if not use previolsy 12.5 bid Or succinate 50

## 2014-07-08 NOTE — Telephone Encounter (Signed)
See phone note 2/29

## 2014-07-08 NOTE — Telephone Encounter (Signed)
Pt calling stating [facility]bs has denied a medication.  BYSTOLIC, needs to know what can do.  If doctor can "override" that ruling. Please advise.   Pt is also only 4 days left, needs something may it be samples or something

## 2014-07-08 NOTE — Telephone Encounter (Signed)
Informed patient of Dr. Aquilla Hacker response  Patient stated he has been on Coreg but would like to try the Metoprolol Succinate  Medication changed  Instructed patient to call with any issues  Patient verbalized understanding

## 2014-09-17 ENCOUNTER — Encounter: Payer: Self-pay | Admitting: Internal Medicine

## 2014-09-17 ENCOUNTER — Ambulatory Visit (INDEPENDENT_AMBULATORY_CARE_PROVIDER_SITE_OTHER): Payer: BLUE CROSS/BLUE SHIELD | Admitting: *Deleted

## 2014-09-17 DIAGNOSIS — I429 Cardiomyopathy, unspecified: Secondary | ICD-10-CM

## 2014-09-17 DIAGNOSIS — I5022 Chronic systolic (congestive) heart failure: Secondary | ICD-10-CM | POA: Diagnosis not present

## 2014-09-17 LAB — PSA

## 2014-09-17 NOTE — Progress Notes (Signed)
Remote ICD transmission.   

## 2014-09-20 LAB — CUP PACEART REMOTE DEVICE CHECK
Battery Voltage: 2.98 V
Brady Statistic AP VP Percent: 55.77 %
Brady Statistic AS VS Percent: 0 %
Brady Statistic RA Percent Paced: 55.77 %
Brady Statistic RV Percent Paced: 100 %
Date Time Interrogation Session: 20160510113011
HIGH POWER IMPEDANCE MEASURED VALUE: 55 Ohm
HighPow Impedance: 46 Ohm
HighPow Impedance: 513 Ohm
Lead Channel Impedance Value: 399 Ohm
Lead Channel Impedance Value: 4047 Ohm
Lead Channel Impedance Value: 4047 Ohm
Lead Channel Impedance Value: 627 Ohm
Lead Channel Pacing Threshold Amplitude: 0.5 V
Lead Channel Pacing Threshold Pulse Width: 0.4 ms
Lead Channel Sensing Intrinsic Amplitude: 0.625 mV
Lead Channel Setting Pacing Amplitude: 2 V
Lead Channel Setting Pacing Pulse Width: 0.4 ms
MDC IDC MSMT LEADCHNL LV IMPEDANCE VALUE: 4047 Ohm
MDC IDC MSMT LEADCHNL RA PACING THRESHOLD AMPLITUDE: 0.5 V
MDC IDC MSMT LEADCHNL RV PACING THRESHOLD PULSEWIDTH: 0.4 ms
MDC IDC SET LEADCHNL RV PACING AMPLITUDE: 2 V
MDC IDC SET LEADCHNL RV SENSING SENSITIVITY: 0.3 mV
MDC IDC STAT BRADY AP VS PERCENT: 0 %
MDC IDC STAT BRADY AS VP PERCENT: 44.23 %
Zone Setting Detection Interval: 300 ms
Zone Setting Detection Interval: 350 ms
Zone Setting Detection Interval: 360 ms
Zone Setting Detection Interval: 370 ms

## 2014-09-30 ENCOUNTER — Encounter: Payer: Self-pay | Admitting: Cardiology

## 2014-12-16 ENCOUNTER — Encounter: Payer: Self-pay | Admitting: Unknown Physician Specialty

## 2014-12-16 ENCOUNTER — Telehealth: Payer: Self-pay | Admitting: Family Medicine

## 2014-12-16 ENCOUNTER — Telehealth: Payer: Self-pay

## 2014-12-16 ENCOUNTER — Ambulatory Visit (INDEPENDENT_AMBULATORY_CARE_PROVIDER_SITE_OTHER): Payer: BLUE CROSS/BLUE SHIELD | Admitting: Unknown Physician Specialty

## 2014-12-16 VITALS — BP 132/89 | HR 76 | Temp 98.2°F | Ht 72.5 in | Wt 205.6 lb

## 2014-12-16 DIAGNOSIS — H109 Unspecified conjunctivitis: Secondary | ICD-10-CM

## 2014-12-16 DIAGNOSIS — I10 Essential (primary) hypertension: Secondary | ICD-10-CM | POA: Insufficient documentation

## 2014-12-16 DIAGNOSIS — J069 Acute upper respiratory infection, unspecified: Secondary | ICD-10-CM

## 2014-12-16 DIAGNOSIS — E119 Type 2 diabetes mellitus without complications: Secondary | ICD-10-CM

## 2014-12-16 DIAGNOSIS — N182 Chronic kidney disease, stage 2 (mild): Secondary | ICD-10-CM | POA: Insufficient documentation

## 2014-12-16 DIAGNOSIS — E1159 Type 2 diabetes mellitus with other circulatory complications: Secondary | ICD-10-CM | POA: Insufficient documentation

## 2014-12-16 DIAGNOSIS — M109 Gout, unspecified: Secondary | ICD-10-CM

## 2014-12-16 MED ORDER — HYDROCOD POLST-CPM POLST ER 10-8 MG/5ML PO SUER: 5.0000 mL | Freq: Every evening | ORAL | Status: DC | PRN

## 2014-12-16 MED ORDER — DEXLANSOPRAZOLE 60 MG PO CPDR: 60.0000 mg | DELAYED_RELEASE_CAPSULE | Freq: Every day | ORAL | Status: DC

## 2014-12-16 MED ORDER — SULFACETAMIDE SODIUM 10 % OP SOLN: 1.0000 [drp] | OPHTHALMIC | Status: DC

## 2014-12-16 NOTE — Telephone Encounter (Signed)
E-fax came through for refill on: Rx: DEXILANT 60 MG capsule Copy in basket.

## 2014-12-16 NOTE — Telephone Encounter (Signed)
Requesting Dexilant DR 60mg  Cap

## 2014-12-16 NOTE — Progress Notes (Signed)
BP 132/89 mmHg  Pulse 76  Temp(Src) 98.2 F (36.8 C)  Ht 6' 0.5" (1.842 m)  Wt 205 lb 9.6 oz (93.26 kg)  BMI 27.49 kg/m2  SpO2 98%   Subjective:    Patient ID: Ryan [name], male    DOB: Dec 08, 1950, 64 y.o.   [name]: [id number]  HPI: [name] is a 64 y.o. male  Chief Complaint  Patient presents with  . Conjunctivitis    pt states eye started hurting around 2 pm yesterday, stated it was "matted up" and he couldnt see out of it. Started in right eye ad spread to left eye overnight   Conjunctivitis  The current episode started yesterday. The onset was sudden. The problem has been gradually worsening. The problem is moderate. Nothing relieves the symptoms. Nothing aggravates the symptoms. Associated symptoms include eye itching, congestion, cough, URI, eye discharge and eye redness. Pertinent negatives include no fever, no decreased vision, no ear pain and no sore throat.  URI  This is a new problem. The current episode started yesterday. The problem has been gradually worsening. There has been no fever. Associated symptoms include congestion and coughing. Pertinent negatives include no ear pain or sore throat. He has tried nothing for the symptoms. The treatment provided no relief.     Relevant past medical, surgical, family and social history reviewed and updated as indicated. Interim medical history since our last visit reviewed. Allergies and medications reviewed and updated.  Review of Systems  Constitutional: Negative for fever.  HENT: Positive for congestion. Negative for ear pain and sore throat.   Eyes: Positive for discharge, redness and itching.  Respiratory: Positive for cough.     Per HPI unless specifically indicated above     Objective:    BP 132/89 mmHg  Pulse 76  Temp(Src) 98.2 F (36.8 C)  Ht 6' 0.5" (1.842 m)  Wt 205 lb 9.6 oz (93.26 kg)  BMI 27.49 kg/m2  SpO2 98%  Wt Readings from Last 3 Encounters:  12/16/14 205 lb 9.6 oz (93.26 kg)  09/17/14  206 lb (93.441 kg)  06/18/14 212 lb 8 oz (96.389 kg)    Physical Exam  Constitutional: He is oriented to person, place, and time. He appears well-developed and well-nourished. No distress.  HENT:  Head: Normocephalic and atraumatic.  Right Ear: Tympanic membrane and ear canal normal.  Left Ear: Tympanic membrane and ear canal normal.  Nose: Mucosal edema and rhinorrhea present.  Mouth/Throat: Posterior oropharyngeal edema present.  Eyes: Conjunctivae and lids are normal. Right eye exhibits no discharge. Left eye exhibits no discharge. No scleral icterus.  Cardiovascular: Normal rate, regular rhythm and normal heart sounds.   Pulmonary/Chest: Effort normal and breath sounds normal. No respiratory distress.  Abdominal: Normal appearance. There is no splenomegaly or hepatomegaly.  Musculoskeletal: Normal range of motion.  Neurological: He is alert and oriented to person, place, and time.  Skin: Skin is intact. No rash noted. No pallor.  Psychiatric: He has a normal mood and affect. His behavior is normal. Judgment and thought content normal.  Nursing note and vitals reviewed.      Assessment & Plan:   Problem List Items Addressed This Visit    None    Visit Diagnoses    URI (upper respiratory infection)    -  Primary    Tussionex for cough    Bilateral conjunctivitis        Will rx Bleph 10  Follow up plan: Return if symptoms worsen or fail to improve.

## 2014-12-17 ENCOUNTER — Ambulatory Visit (INDEPENDENT_AMBULATORY_CARE_PROVIDER_SITE_OTHER): Payer: BLUE CROSS/BLUE SHIELD | Admitting: *Deleted

## 2014-12-17 DIAGNOSIS — I5022 Chronic systolic (congestive) heart failure: Secondary | ICD-10-CM | POA: Diagnosis not present

## 2014-12-17 DIAGNOSIS — I429 Cardiomyopathy, unspecified: Secondary | ICD-10-CM

## 2014-12-17 NOTE — Progress Notes (Signed)
Remote ICD transmission.   

## 2014-12-23 LAB — CUP PACEART REMOTE DEVICE CHECK
Brady Statistic AP VP Percent: 38.99 %
Brady Statistic AS VP Percent: 61.01 %
Brady Statistic RA Percent Paced: 38.99 %
Brady Statistic RV Percent Paced: 100 %
Date Time Interrogation Session: 20160809140534
HIGH POWER IMPEDANCE MEASURED VALUE: 60 Ohm
HighPow Impedance: 47 Ohm
HighPow Impedance: 627 Ohm
Lead Channel Impedance Value: 4047 Ohm
Lead Channel Impedance Value: 4047 Ohm
Lead Channel Pacing Threshold Amplitude: 0.5 V
Lead Channel Pacing Threshold Amplitude: 0.5 V
Lead Channel Pacing Threshold Pulse Width: 0.4 ms
Lead Channel Sensing Intrinsic Amplitude: 1.125 mV
Lead Channel Setting Pacing Amplitude: 2 V
Lead Channel Setting Pacing Amplitude: 2 V
Lead Channel Setting Pacing Pulse Width: 0.4 ms
MDC IDC MSMT BATTERY VOLTAGE: 2.94 V
MDC IDC MSMT LEADCHNL LV IMPEDANCE VALUE: 4047 Ohm
MDC IDC MSMT LEADCHNL RA IMPEDANCE VALUE: 437 Ohm
MDC IDC MSMT LEADCHNL RA PACING THRESHOLD PULSEWIDTH: 0.4 ms
MDC IDC MSMT LEADCHNL RA SENSING INTR AMPL: 1.125 mV
MDC IDC MSMT LEADCHNL RV IMPEDANCE VALUE: 665 Ohm
MDC IDC SET LEADCHNL RV SENSING SENSITIVITY: 0.3 mV
MDC IDC SET ZONE DETECTION INTERVAL: 300 ms
MDC IDC SET ZONE DETECTION INTERVAL: 360 ms
MDC IDC STAT BRADY AP VS PERCENT: 0 %
MDC IDC STAT BRADY AS VS PERCENT: 0 %
Zone Setting Detection Interval: 350 ms
Zone Setting Detection Interval: 370 ms

## 2014-12-31 ENCOUNTER — Ambulatory Visit (INDEPENDENT_AMBULATORY_CARE_PROVIDER_SITE_OTHER): Payer: BLUE CROSS/BLUE SHIELD | Admitting: Family Medicine

## 2014-12-31 ENCOUNTER — Other Ambulatory Visit: Payer: Self-pay | Admitting: Internal Medicine

## 2014-12-31 ENCOUNTER — Encounter: Payer: Self-pay | Admitting: Family Medicine

## 2014-12-31 VITALS — BP 138/83 | HR 79 | Temp 97.9°F | Ht 72.6 in | Wt 205.0 lb

## 2014-12-31 DIAGNOSIS — E785 Hyperlipidemia, unspecified: Secondary | ICD-10-CM

## 2014-12-31 DIAGNOSIS — E119 Type 2 diabetes mellitus without complications: Secondary | ICD-10-CM | POA: Diagnosis not present

## 2014-12-31 DIAGNOSIS — M1000 Idiopathic gout, unspecified site: Secondary | ICD-10-CM | POA: Diagnosis not present

## 2014-12-31 DIAGNOSIS — I10 Essential (primary) hypertension: Secondary | ICD-10-CM

## 2014-12-31 LAB — BAYER DCA HB A1C WAIVED: HB A1C: 6.3 % (ref <7.0)

## 2014-12-31 NOTE — Assessment & Plan Note (Signed)
The current medical regimen is effective;  continue present plan and medications.  

## 2014-12-31 NOTE — Progress Notes (Signed)
   BP 138/83 mmHg  Pulse 79  Temp(Src) 97.9 F (36.6 C)  Ht 6' 0.6" (1.844 m)  Wt 205 lb (92.987 kg)  BMI 27.35 kg/m2  SpO2 99%   Subjective:    Patient ID: Ryan [name], male    DOB: [birth year], 64 y.o.   [name]: [id number]  HPI: Ryan [name] is a 63 y.o. male  No chief complaint on file.  patient for follow-up hypertension doing well, Diabetes doing well on monitoring indicating good control No gout spells Taking medications without side effects and faithfully. Relevant past medical, surgical, family and social history reviewed and updated as indicated. Interim medical history since our last visit reviewed. Allergies and medications reviewed and updated.  Review of Systems  Constitutional: Negative.   Respiratory: Negative.   Cardiovascular: Negative.     Per HPI unless specifically indicated above     Objective:    BP 138/83 mmHg  Pulse 79  Temp(Src) 97.9 F (36.6 C)  Ht 6' 0.6" (1.844 m)  Wt 205 lb (92.987 kg)  BMI 27.35 kg/m2  SpO2 99%  Wt Readings from Last 3 Encounters:  12/31/14 205 lb (92.987 kg)  12/16/14 205 lb 9.6 oz (93.26 kg)  09/17/14 206 lb (93.441 kg)    Physical Exam  Constitutional: He is oriented to person, place, and time. He appears well-developed and well-nourished. No distress.  HENT:  Head: Normocephalic and atraumatic.  Right Ear: Hearing normal.  Left Ear: Hearing normal.  Nose: Nose normal.  Eyes: Conjunctivae and lids are normal. Right eye exhibits no discharge. Left eye exhibits no discharge. No scleral icterus.  Cardiovascular: Normal rate, regular rhythm and normal heart sounds.   Pulmonary/Chest: Effort normal and breath sounds normal. No respiratory distress.  Musculoskeletal: Normal range of motion.  Neurological: He is alert and oriented to person, place, and time.  Skin: Skin is intact. No rash noted.  Psychiatric: He has a normal mood and affect. His speech is normal and behavior is normal. Judgment and thought content  normal. Cognition and memory are normal.        Assessment & Plan:   Problem List Items Addressed This Visit      Endocrine   Diabetes mellitus    The current medical regimen is effective;  continue present plan and medications.         Other   Hyperlipidemia    The current medical regimen is effective;  continue present plan and medications.       Gout    The current medical regimen is effective;  continue present plan and medications.        Other Visit Diagnoses    Diabetes mellitus without complication    -  Primary    Relevant Orders    Bayer DCA Hb A1c Waived    Essential hypertension, benign        Relevant Orders    Basic metabolic panel        Follow up plan: Return in about 3 months (around 04/02/2015) for med check.

## 2015-01-01 ENCOUNTER — Other Ambulatory Visit: Payer: Self-pay | Admitting: Internal Medicine

## 2015-01-01 ENCOUNTER — Encounter: Payer: Self-pay | Admitting: Family Medicine

## 2015-01-01 LAB — BASIC METABOLIC PANEL
BUN/Creatinine Ratio: 14 (ref 10–22)
BUN: 17 mg/dL (ref 8–27)
CALCIUM: 9.7 mg/dL (ref 8.6–10.2)
CHLORIDE: 101 mmol/L (ref 97–108)
CO2: 20 mmol/L (ref 18–29)
CREATININE: 1.23 mg/dL (ref 0.76–1.27)
GFR calc Af Amer: 71 mL/min/{1.73_m2} (ref >59)
GFR calc non Af Amer: 62 mL/min/{1.73_m2} (ref >59)
Glucose: 134 mg/dL — ABNORMAL HIGH (ref 65–99)
Potassium: 4.9 mmol/L (ref 3.5–5.2)
Sodium: 141 mmol/L (ref 134–144)

## 2015-01-07 ENCOUNTER — Other Ambulatory Visit: Payer: Self-pay | Admitting: Internal Medicine

## 2015-01-17 ENCOUNTER — Encounter: Payer: Self-pay | Admitting: Cardiology

## 2015-01-20 ENCOUNTER — Other Ambulatory Visit: Payer: Self-pay | Admitting: Family Medicine

## 2015-01-20 ENCOUNTER — Telehealth: Payer: Self-pay

## 2015-01-20 MED ORDER — FEBUXOSTAT 40 MG PO TABS: 40.0000 mg | ORAL_TABLET | Freq: Every day | ORAL | Status: DC

## 2015-01-20 NOTE — Telephone Encounter (Signed)
[place] requesting 90 day Rx Uloric 40mg  Tab

## 2015-01-20 NOTE — Telephone Encounter (Signed)
Rx sent to his pharmacy

## 2015-01-27 ENCOUNTER — Encounter: Payer: Self-pay | Admitting: Internal Medicine

## 2015-02-21 ENCOUNTER — Telehealth: Payer: Self-pay

## 2015-02-21 ENCOUNTER — Telehealth: Payer: Self-pay | Admitting: *Deleted

## 2015-02-21 NOTE — Telephone Encounter (Signed)
ICD Remote received. No alerts or episodes. All diagnostics & measurements normal. OptiVol good (no fluid retention). Advised pt from a heart rhythm standpoint, all is normal. Pt will contact his PCP Monday at his discretion.

## 2015-02-21 NOTE — Telephone Encounter (Signed)
Pt wife called states in the last 3 days, pt has has a couple of dizzy spells, states he gets very nauseate, weak feeling,. No SOB, CP. Please call and advise if pt needs an appt.

## 2015-02-21 NOTE — Telephone Encounter (Signed)
S/w pt who reports two dizzy spells in the past week. Last Thursday while he was at work he reports dizzy, cold sweat. Resolved on own. States he was at his barn this morning and experienced blurry vision and dizziness. Went home to lay down, symptoms resolved. Takes metoprolol qd but does not monitor BP at home. Pt is diabetic but does not have current reading. Reports BS typically 130-160.  He is concerned for afib. Pt has Medtronic ICD. Last remote check end of August. Next is in November.  Spoke to [place] in  [employer] who states pt can send manual transmission for review.  Notified pt to send manual remote transmission and to continue monitoring symptoms. Pt verbalized understanding.

## 2015-03-14 ENCOUNTER — Other Ambulatory Visit: Payer: Self-pay | Admitting: Internal Medicine

## 2015-03-14 ENCOUNTER — Encounter: Payer: Self-pay | Admitting: Internal Medicine

## 2015-03-14 MED ORDER — TELMISARTAN 80 MG PO TABS: 80.0000 mg | ORAL_TABLET | Freq: Every day | ORAL | Status: DC

## 2015-03-18 ENCOUNTER — Ambulatory Visit (INDEPENDENT_AMBULATORY_CARE_PROVIDER_SITE_OTHER): Payer: BLUE CROSS/BLUE SHIELD | Admitting: *Deleted

## 2015-03-18 DIAGNOSIS — I429 Cardiomyopathy, unspecified: Secondary | ICD-10-CM | POA: Diagnosis not present

## 2015-03-18 NOTE — Progress Notes (Signed)
Remote ICD transmission.   

## 2015-03-20 LAB — CUP PACEART REMOTE DEVICE CHECK
Brady Statistic AP VS Percent: 0 %
Brady Statistic AS VP Percent: 67.4 %
Brady Statistic AS VS Percent: 0 %
Brady Statistic RV Percent Paced: 100 %
HIGH POWER IMPEDANCE MEASURED VALUE: 532 Ohm
HighPow Impedance: 45 Ohm
HighPow Impedance: 54 Ohm
Implantable Lead Implant Date: 20060505
Implantable Lead Implant Date: 20120111
Implantable Lead Location: 753860
Implantable Lead Model: 5076
Lead Channel Impedance Value: 399 Ohm
Lead Channel Impedance Value: 4047 Ohm
Lead Channel Impedance Value: 4047 Ohm
Lead Channel Impedance Value: 4047 Ohm
Lead Channel Pacing Threshold Amplitude: 0.625 V
Lead Channel Pacing Threshold Pulse Width: 0.4 ms
Lead Channel Pacing Threshold Pulse Width: 0.4 ms
Lead Channel Sensing Intrinsic Amplitude: 1.125 mV
Lead Channel Sensing Intrinsic Amplitude: 1.125 mV
Lead Channel Setting Pacing Amplitude: 2 V
MDC IDC LEAD LOCATION: 753859
MDC IDC MSMT BATTERY VOLTAGE: 2.92 V
MDC IDC MSMT LEADCHNL RV IMPEDANCE VALUE: 627 Ohm
MDC IDC MSMT LEADCHNL RV PACING THRESHOLD AMPLITUDE: 0.625 V
MDC IDC SESS DTM: 20161108093728
MDC IDC SET LEADCHNL RA PACING AMPLITUDE: 2 V
MDC IDC SET LEADCHNL RV PACING PULSEWIDTH: 0.4 ms
MDC IDC SET LEADCHNL RV SENSING SENSITIVITY: 0.3 mV
MDC IDC STAT BRADY AP VP PERCENT: 32.6 %
MDC IDC STAT BRADY RA PERCENT PACED: 32.6 %

## 2015-03-21 ENCOUNTER — Encounter: Payer: Self-pay | Admitting: Cardiology

## 2015-03-31 ENCOUNTER — Other Ambulatory Visit: Payer: Self-pay | Admitting: Family Medicine

## 2015-04-02 ENCOUNTER — Other Ambulatory Visit: Payer: Self-pay | Admitting: Family Medicine

## 2015-04-15 ENCOUNTER — Encounter: Payer: Self-pay | Admitting: Family Medicine

## 2015-04-15 ENCOUNTER — Ambulatory Visit (INDEPENDENT_AMBULATORY_CARE_PROVIDER_SITE_OTHER): Payer: BLUE CROSS/BLUE SHIELD | Admitting: Family Medicine

## 2015-04-15 VITALS — BP 142/82 | HR 71 | Temp 98.5°F | Ht 71.7 in | Wt 213.0 lb

## 2015-04-15 DIAGNOSIS — I10 Essential (primary) hypertension: Secondary | ICD-10-CM | POA: Diagnosis not present

## 2015-04-15 DIAGNOSIS — J069 Acute upper respiratory infection, unspecified: Secondary | ICD-10-CM | POA: Diagnosis not present

## 2015-04-15 DIAGNOSIS — E119 Type 2 diabetes mellitus without complications: Secondary | ICD-10-CM

## 2015-04-15 LAB — BAYER DCA HB A1C WAIVED: HB A1C: 6.6 % (ref <7.0)

## 2015-04-15 MED ORDER — HYDROCOD POLST-CPM POLST ER 10-8 MG/5ML PO SUER: 2.5000 mL | Freq: Two times a day (BID) | ORAL | Status: DC | PRN

## 2015-04-15 NOTE — Progress Notes (Signed)
   BP 142/82 mmHg  Pulse 71  Temp(Src) 98.5 F (36.9 C)  Ht 5' 11.7" (1.821 m)  Wt 213 lb (96.616 kg)  BMI 29.14 kg/m2  SpO2 98%   Subjective:    Patient ID: Ryan [name], male    DOB: 15-Aug-1950, 64 y.o.   [name]: [id number]  HPI: Ryan [name] is a 64 y.o. male  Chief Complaint  Patient presents with  . Diabetes   Patient doing well diabetes no complaints from medications noted low blood sugar spells Patient has developed a cold over the last 2 days some head congestion and drainage cough and sneezing wants cough syrup is not sleeping due to coughing has tried some over-the-counter medications made his blood pressure go up. Patient's blood pressure doing well all in all with home checks sees his cardiologist 4 times a year   Relevant past medical, surgical, family and social history reviewed and updated as indicated. Interim medical history since our last visit reviewed. Allergies and medications reviewed and updated.  Review of Systems  Constitutional: Negative.   HENT: Positive for postnasal drip, rhinorrhea and sneezing. Negative for ear pain, sinus pressure and sore throat.   Respiratory: Negative.   Cardiovascular: Negative.     Per HPI unless specifically indicated above     Objective:    BP 142/82 mmHg  Pulse 71  Temp(Src) 98.5 F (36.9 C)  Ht 5' 11.7" (1.821 m)  Wt 213 lb (96.616 kg)  BMI 29.14 kg/m2  SpO2 98%  Wt Readings from Last 3 Encounters:  04/15/15 213 lb (96.616 kg)  12/31/14 205 lb (92.987 kg)  12/16/14 205 lb 9.6 oz (93.26 kg)    Physical Exam  Constitutional: He is oriented to person, place, and time. He appears well-developed and well-nourished. No distress.  HENT:  Head: Normocephalic and atraumatic.  Right Ear: Hearing and external ear normal.  Left Ear: Hearing and external ear normal.  Nose: Nose normal.  Mouth/Throat: No oropharyngeal exudate.  Eyes: Conjunctivae and lids are normal. Right eye exhibits no discharge. Left eye  exhibits no discharge. No scleral icterus.  Cardiovascular: Normal rate.   Pulmonary/Chest: Effort normal and breath sounds normal. No respiratory distress.  Musculoskeletal: Normal range of motion.  Neurological: He is alert and oriented to person, place, and time.  Skin: Skin is intact. No rash noted.  Psychiatric: He has a normal mood and affect. His speech is normal and behavior is normal. Judgment and thought content normal. Cognition and memory are normal.        Assessment & Plan:   Problem List Items Addressed This Visit      Cardiovascular and Mediastinum   Hypertension - Primary    Mild elevation taking cough cold medicines patient will watch blood pressure and make sure is back to normal modest use of Sudafed        Respiratory   Upper respiratory infection    Discussed home care use Tylenol and over-the-counter medications if getting worse will notify us for consideration of antibiotics        Endocrine   Diabetes mellitus ([place])    The current medical regimen is effective;  continue present plan and medications.        Other Visit Diagnoses    Diabetes mellitus without complication ([place])            Follow up plan: Return for Physical Exam this winter.

## 2015-04-15 NOTE — Assessment & Plan Note (Signed)
The current medical regimen is effective;  continue present plan and medications.  

## 2015-04-15 NOTE — Assessment & Plan Note (Signed)
Discussed home care use Tylenol and over-the-counter medications if getting worse will notify us for consideration of antibiotics

## 2015-04-15 NOTE — Assessment & Plan Note (Signed)
Mild elevation taking cough cold medicines patient will watch blood pressure and make sure is back to normal modest use of Sudafed

## 2015-04-17 ENCOUNTER — Other Ambulatory Visit: Payer: Self-pay

## 2015-06-09 ENCOUNTER — Other Ambulatory Visit: Payer: Self-pay | Admitting: Internal Medicine

## 2015-06-17 ENCOUNTER — Ambulatory Visit (INDEPENDENT_AMBULATORY_CARE_PROVIDER_SITE_OTHER): Payer: BLUE CROSS/BLUE SHIELD | Admitting: Family Medicine

## 2015-06-17 ENCOUNTER — Encounter: Payer: Self-pay | Admitting: Family Medicine

## 2015-06-17 VITALS — BP 146/90 | HR 76 | Temp 98.1°F | Ht 72.1 in | Wt 209.0 lb

## 2015-06-17 DIAGNOSIS — I10 Essential (primary) hypertension: Secondary | ICD-10-CM | POA: Diagnosis not present

## 2015-06-17 DIAGNOSIS — E119 Type 2 diabetes mellitus without complications: Secondary | ICD-10-CM

## 2015-06-17 DIAGNOSIS — Z0000 Encounter for general adult medical examination without abnormal findings: Secondary | ICD-10-CM

## 2015-06-17 DIAGNOSIS — I5022 Chronic systolic (congestive) heart failure: Secondary | ICD-10-CM | POA: Diagnosis not present

## 2015-06-17 LAB — URINALYSIS, ROUTINE W REFLEX MICROSCOPIC
Bilirubin, UA: NEGATIVE
Leukocytes, UA: NEGATIVE
Nitrite, UA: NEGATIVE
PROTEIN UA: NEGATIVE
RBC, UA: NEGATIVE
Specific Gravity, UA: <1.005 — ABNORMAL LOW (ref 1.005–1.030)
Urobilinogen, Ur: 0.2 mg/dL (ref 0.2–1.0)
pH, UA: 5 (ref 5.0–7.5)

## 2015-06-17 MED ORDER — METFORMIN HCL 500 MG PO TABS: 500.0000 mg | ORAL_TABLET | Freq: Two times a day (BID) | ORAL | Status: DC

## 2015-06-17 MED ORDER — OMEPRAZOLE 40 MG PO CPDR: 40.0000 mg | DELAYED_RELEASE_CAPSULE | Freq: Every day | ORAL | Status: DC

## 2015-06-17 MED ORDER — FEBUXOSTAT 40 MG PO TABS: 40.0000 mg | ORAL_TABLET | Freq: Every day | ORAL | Status: DC

## 2015-06-17 NOTE — Assessment & Plan Note (Signed)
The current medical regimen is effective;  continue present plan and medications.  

## 2015-06-17 NOTE — Assessment & Plan Note (Signed)
Discuss Will start low-dose metformin observe patient's response with concern about GI affect Will observe renal function Will recheck 1 month with A1c

## 2015-06-17 NOTE — Assessment & Plan Note (Signed)
Followed by cardiology and stable 

## 2015-06-17 NOTE — Progress Notes (Signed)
BP 146/90 mmHg  Pulse 76  Temp(Src) 98.1 F (36.7 C)  Ht 6' 0.1" (1.831 m)  Wt 209 lb (94.802 kg)  BMI 28.28 kg/m2  SpO2 99%   Subjective:    Patient ID: Ryan [name], male    DOB: [birth year], 65 y.o.   [name]: [id number]  HPI: Ryan [name] is a 65 y.o. male  Chief Complaint  Patient presents with  . Annual Exam   patient follow-up has been doing well with no complaints from medication noted low blood sugar spells reflux stable, gout stable, CHF cardiomyopathy all stable Followed by a heart doctor without problems Diabetes doing well taking Invokanna concerned as cramps starting 30 minutes to an hour or so after physical activity ever since starting Invokanna. Patient's retired now and getting ready to do a lot of travel with increased physical activity wants to consider alternatives.  Relevant past medical, surgical, family and social history reviewed and updated as indicated. Interim medical history since our last visit reviewed. Allergies and medications reviewed and updated.  Review of Systems  Constitutional: Negative.   HENT: Negative.   Eyes: Negative.   Respiratory: Negative.   Cardiovascular: Negative.   Gastrointestinal: Negative.   Endocrine: Negative.   Genitourinary: Negative.   Musculoskeletal: Negative.   Skin: Negative.   Allergic/Immunologic: Negative.   Neurological: Negative.   Hematological: Negative.   Psychiatric/Behavioral: Negative.     Per HPI unless specifically indicated above     Objective:    BP 146/90 mmHg  Pulse 76  Temp(Src) 98.1 F (36.7 C)  Ht 6' 0.1" (1.831 m)  Wt 209 lb (94.802 kg)  BMI 28.28 kg/m2  SpO2 99%  Wt Readings from Last 3 Encounters:  06/17/15 209 lb (94.802 kg)  04/15/15 213 lb (96.616 kg)  12/31/14 205 lb (92.987 kg)    Physical Exam  Constitutional: He is oriented to person, place, and time. He appears well-developed and well-nourished.  HENT:  Head: Normocephalic and atraumatic.  Right Ear: External  ear normal.  Left Ear: External ear normal.  Eyes: Conjunctivae and EOM are normal. Pupils are equal, round, and reactive to light.  Neck: Normal range of motion. Neck supple.  Cardiovascular: Normal rate, regular rhythm, normal heart sounds and intact distal pulses.   Pulmonary/Chest: Effort normal and breath sounds normal.  Abdominal: Soft. Bowel sounds are normal. There is no splenomegaly or hepatomegaly.  Genitourinary: Rectum normal, prostate normal and penis normal.  Musculoskeletal: Normal range of motion.  Neurological: He is alert and oriented to person, place, and time. He has normal reflexes.  Skin: No rash noted. No erythema.  Psychiatric: He has a normal mood and affect. His behavior is normal. Judgment and thought content normal.    Results for orders placed or performed in visit on 04/15/15  Bayer DCA Hb A1c Waived  Result Value Ref Range   Bayer DCA Hb A1c Waived 6.6 <7.0 %      Assessment & Plan:   Problem List Items Addressed This Visit      Cardiovascular and Mediastinum   SYSTOLIC HEART FAILURE, CHRONIC    Followed by cardiology and stable      Hypertension    The current medical regimen is effective;  continue present plan and medications.         Endocrine   Diabetes mellitus ([place])    Discuss Will start low-dose metformin observe patient's response with concern about GI affect Will observe renal function Will recheck 1 month with A1c  Relevant Medications   metFORMIN (GLUCOPHAGE) 500 MG tablet    Other Visit Diagnoses    Routine general medical examination at a health care facility    -  Primary    Relevant Orders    CBC with Differential/Platelet    Comprehensive metabolic panel    Lipid Panel w/o Chol/HDL Ratio    PSA    TSH    Urinalysis, Routine w reflex microscopic (not at [facility])        Follow up plan: Return in about 4 weeks (around 07/15/2015) for a1c.

## 2015-06-18 LAB — CBC WITH DIFFERENTIAL/PLATELET
BASOS ABS: 0.1 10*3/uL (ref 0.0–0.2)
Basos: 1 %
EOS (ABSOLUTE): 0.2 10*3/uL (ref 0.0–0.4)
Eos: 3 %
Hematocrit: 45.7 % (ref 37.5–51.0)
Hemoglobin: 15.5 g/dL (ref 12.6–17.7)
IMMATURE GRANS (ABS): 0 10*3/uL (ref 0.0–0.1)
Immature Granulocytes: 0 %
LYMPHS: 34 %
Lymphocytes Absolute: 2.3 10*3/uL (ref 0.7–3.1)
MCH: 30.3 pg (ref 26.6–33.0)
MCHC: 33.9 g/dL (ref 31.5–35.7)
MCV: 89 fL (ref 79–97)
Monocytes Absolute: 0.5 10*3/uL (ref 0.1–0.9)
Monocytes: 7 %
NEUTROS ABS: 3.7 10*3/uL (ref 1.4–7.0)
NEUTROS PCT: 55 %
PLATELETS: 179 10*3/uL (ref 150–379)
RBC: 5.12 x10E6/uL (ref 4.14–5.80)
RDW: 14.2 % (ref 12.3–15.4)
WBC: 6.8 10*3/uL (ref 3.4–10.8)

## 2015-06-18 LAB — COMPREHENSIVE METABOLIC PANEL
ALK PHOS: 64 IU/L (ref 39–117)
ALT: 40 IU/L (ref 0–44)
AST: 21 IU/L (ref 0–40)
Albumin/Globulin Ratio: 1.9 (ref 1.1–2.5)
Albumin: 4.5 g/dL (ref 3.6–4.8)
BILIRUBIN TOTAL: 0.5 mg/dL (ref 0.0–1.2)
BUN/Creatinine Ratio: 12 (ref 10–22)
BUN: 16 mg/dL (ref 8–27)
CHLORIDE: 101 mmol/L (ref 96–106)
CO2: 22 mmol/L (ref 18–29)
Calcium: 9.5 mg/dL (ref 8.6–10.2)
Creatinine, Ser: 1.3 mg/dL — ABNORMAL HIGH (ref 0.76–1.27)
GFR calc Af Amer: 67 mL/min/{1.73_m2} (ref >59)
GFR calc non Af Amer: 58 mL/min/{1.73_m2} — ABNORMAL LOW (ref >59)
GLUCOSE: 209 mg/dL — AB (ref 65–99)
Globulin, Total: 2.4 g/dL (ref 1.5–4.5)
Potassium: 4.3 mmol/L (ref 3.5–5.2)
Sodium: 139 mmol/L (ref 134–144)
TOTAL PROTEIN: 6.9 g/dL (ref 6.0–8.5)

## 2015-06-18 LAB — LIPID PANEL W/O CHOL/HDL RATIO
CHOLESTEROL TOTAL: 151 mg/dL (ref 100–199)
HDL: 28 mg/dL — AB (ref >39)
LDL Calculated: 50 mg/dL (ref 0–99)
TRIGLYCERIDES: 366 mg/dL — AB (ref 0–149)
VLDL Cholesterol Cal: 73 mg/dL — ABNORMAL HIGH (ref 5–40)

## 2015-06-18 LAB — PSA: PROSTATE SPECIFIC AG, SERUM: 1 ng/mL (ref 0.0–4.0)

## 2015-06-18 LAB — TSH: TSH: 4.88 u[IU]/mL — AB (ref 0.450–4.500)

## 2015-06-19 ENCOUNTER — Telehealth: Payer: Self-pay | Admitting: Family Medicine

## 2015-06-19 DIAGNOSIS — R7989 Other specified abnormal findings of blood chemistry: Secondary | ICD-10-CM

## 2015-06-19 NOTE — Telephone Encounter (Signed)
-----   Message from Wynn Maudlin, [place] sent at 06/19/2015  2:26 PM EST ----- labs

## 2015-06-19 NOTE — Telephone Encounter (Signed)
Phone call Discussed with patient slight elevation TSH will recheck in a couple of months with no thyroid symptoms

## 2015-06-23 NOTE — Progress Notes (Signed)
skf      Patient Care Team: Guadalupe Maple, MD as PCP - General (Unknown Physician Specialty)   HPI  Ryan [name] is a 65 y.o. male Seen in followup for an ICD implanted for syncope in the setting of a nonischemic cardiomyopathy. He is now device dependent. He had 6949 lead in place and underwent generator replacement with a new 6947-lead January 2012   The patient denies chest pain, shortness of breath, nocturnal dyspnea, orthopnea or peripheral edema.  There have been no palpitations, lightheadedness or syncope.   He retired in January  Labs checked by PCP normal blood work x [employer] and Cr 1.3  (GFR 58) 2/17  Echocardiogram 3/14 demonstrated ejection fraction of 40-45% with significant left atrial enlargement  Past Medical History  Diagnosis Date  . Nonischemic cardiomyopathy ([place])   . Syncope   . Complete heart block ([place])   . Paroxysmal atrial fibrillation ([facility])   . Hypertension   . Dual ICD (implantable cardiac defibrillator-Medtronic     [phone] the implanted originally was 6947 implanted change out 2012  . Kidney stones   . Gout   . Depression   . GERD (gastroesophageal reflux disease)   . Anxiety   . IBS (irritable bowel syndrome)     Past Surgical History  Procedure Laterality Date  . Cardiac defibrillator placement    . Lithotripsy    . Upper endoscopy w/ esophageal manometry      Current Outpatient Prescriptions  Medication Sig Dispense Refill  . aspirin 81 MG EC tablet Take 81 mg by mouth daily.      Marland Kitchen dexlansoprazole (DEXILANT) 60 MG capsule Take 60 mg by mouth daily.    . febuxostat (ULORIC) 40 MG tablet Take 1 tablet (40 mg total) by mouth daily. 90 tablet 4  . metFORMIN (GLUCOPHAGE) 500 MG tablet Take 500 mg by mouth daily with breakfast.    . metoprolol succinate (TOPROL-XL) 50 MG 24 hr tablet Take 1 tablet (50 mg total) by mouth daily. Take with or immediately f ollowing a meal. 90 tablet 0  . telmisartan (MICARDIS) 80 MG tablet Take 1 tablet (80 mg  total) by mouth daily. 30 tablet 3   No current facility-administered medications for this visit.    Allergies  Allergen Reactions  . Ace Inhibitors   . Crestor [Rosuvastatin Calcium] Other (See Comments)    Muscle aches    Review of Systems negative except from HPI and PMH  Physical Exam BP 144/88 mmHg  Pulse 74  Ht 6' 0.5" (1.842 m)  Wt 215 lb (97.523 kg)  BMI 28.74 kg/m2 Well developed and nourished in no acute distress HENT normal Neck supple with JVP-flat Clear Regular rate and rhythm, no murmurs or gallops Abd-soft with active BS No Clubbing cyanosis edema Skin-warm and dry A & Oriented  Grossly normal sensory and motor function Device pocket well healed; without hematoma or erythema.  There is no tethering   ECG demonstrates P synchronous pacing with CHB  Assessment and  Plan  Nonischemic cardiomyopathy continue current medications. Class 1-2 symptoms no indication for Aldactone  Complete heart block stable post pacing  Paroxysmal atrial fibrillation no intercurrent atrial fibrillation on aspirin  Status post ICD-Medtronic LV port plugged The patient's device was interrogated.  The information was reviewed. No changes were made in the programming.    Chronic systolic heart failure  euvoleimic  Statin intolerance  Hypertension  His blood pressure is much improved with interval weight loss.  We  will discontinue the HCTZ. We will continue his ARB and his beta blocker.  His blood pressure is in the range with the ACCORD trial with blood pressures at home 130 range.  We discussed the potential implications of chronic right ventricular pacing and recent data from [facility] suggesting  3% annual risk of the development of a pacemaker cardiomyopathy. We will plan to reassess LV function prior to generator replacement in the absence of symptoms that would prompt an earlier.

## 2015-06-24 ENCOUNTER — Ambulatory Visit (INDEPENDENT_AMBULATORY_CARE_PROVIDER_SITE_OTHER): Payer: BLUE CROSS/BLUE SHIELD | Admitting: Internal Medicine

## 2015-06-24 ENCOUNTER — Other Ambulatory Visit: Payer: Self-pay | Admitting: Family Medicine

## 2015-06-24 ENCOUNTER — Encounter: Payer: Self-pay | Admitting: Internal Medicine

## 2015-06-24 VITALS — BP 144/88 | HR 74 | Ht 72.5 in | Wt 215.0 lb

## 2015-06-24 DIAGNOSIS — Z95810 Presence of automatic (implantable) cardiac defibrillator: Secondary | ICD-10-CM

## 2015-06-24 DIAGNOSIS — I10 Essential (primary) hypertension: Secondary | ICD-10-CM | POA: Diagnosis not present

## 2015-06-24 DIAGNOSIS — I442 Atrioventricular block, complete: Secondary | ICD-10-CM | POA: Diagnosis not present

## 2015-06-24 MED ORDER — METFORMIN HCL ER (OSM) 500 MG PO TB24: 500.0000 mg | ORAL_TABLET | Freq: Two times a day (BID) | ORAL | Status: DC

## 2015-06-24 NOTE — Patient Instructions (Signed)
Medication Instructions: - Your physician recommends that you continue on your current medications as directed. Please refer to the Current Medication list given to you today.  Labwork: - none  Procedures/Testing: - none  Follow-Up: - Remote monitoring is used to monitor your Pacemaker of ICD from home. This monitoring reduces the number of office visits required to check your device to one time per year. It allows [place] to keep an eye on the functioning of your device to ensure it is working properly. You are scheduled for a device check from home on 09/23/15. You may send your transmission at any time that day. If you have a wireless device, the transmission will be sent automatically. After your physician reviews your transmission, you will receive a postcard with your next transmission date.  - Your physician wants you to follow-up in: 1 year with Dr. Caryl Comes. You will receive a reminder letter in the mail two months in advance. If you don't receive a letter, please call our office to schedule the follow-up appointment.  Any Additional Special Instructions Will Be Listed Below (If Applicable).     If you need a refill on your cardiac medications before your next appointment, please call your pharmacy.

## 2015-06-25 LAB — CUP PACEART INCLINIC DEVICE CHECK
Brady Statistic AP VS Percent: 0 %
Brady Statistic AS VS Percent: 0 %
Brady Statistic RV Percent Paced: 99.99 %
HIGH POWER IMPEDANCE MEASURED VALUE: 44 Ohm
HIGH POWER IMPEDANCE MEASURED VALUE: 532 Ohm
HighPow Impedance: 54 Ohm
Implantable Lead Implant Date: 20060505
Implantable Lead Location: 753859
Lead Channel Impedance Value: 4047 Ohm
Lead Channel Impedance Value: 4047 Ohm
Lead Channel Impedance Value: 589 Ohm
Lead Channel Pacing Threshold Amplitude: 0.5 V
Lead Channel Pacing Threshold Pulse Width: 0.4 ms
Lead Channel Setting Pacing Amplitude: 2 V
Lead Channel Setting Pacing Pulse Width: 0.4 ms
MDC IDC LEAD IMPLANT DT: 20120111
MDC IDC LEAD LOCATION: 753860
MDC IDC MSMT BATTERY VOLTAGE: 2.84 V
MDC IDC MSMT LEADCHNL LV IMPEDANCE VALUE: 4047 Ohm
MDC IDC MSMT LEADCHNL RA IMPEDANCE VALUE: 399 Ohm
MDC IDC MSMT LEADCHNL RA PACING THRESHOLD AMPLITUDE: 0.75 V
MDC IDC MSMT LEADCHNL RA PACING THRESHOLD PULSEWIDTH: 0.4 ms
MDC IDC MSMT LEADCHNL RA SENSING INTR AMPL: 2.125 mV
MDC IDC SESS DTM: 20170214155219
MDC IDC SET LEADCHNL RV PACING AMPLITUDE: 2.5 V
MDC IDC SET LEADCHNL RV SENSING SENSITIVITY: 0.3 mV
MDC IDC STAT BRADY AP VP PERCENT: 38.81 %
MDC IDC STAT BRADY AS VP PERCENT: 61.18 %
MDC IDC STAT BRADY RA PERCENT PACED: 38.82 %

## 2015-07-16 ENCOUNTER — Ambulatory Visit (INDEPENDENT_AMBULATORY_CARE_PROVIDER_SITE_OTHER): Payer: BLUE CROSS/BLUE SHIELD | Admitting: Family Medicine

## 2015-07-16 ENCOUNTER — Telehealth: Payer: Self-pay | Admitting: Internal Medicine

## 2015-07-16 ENCOUNTER — Encounter: Payer: Self-pay | Admitting: Family Medicine

## 2015-07-16 VITALS — BP 163/100 | HR 72 | Temp 97.9°F | Ht 71.7 in | Wt 209.0 lb

## 2015-07-16 DIAGNOSIS — E119 Type 2 diabetes mellitus without complications: Secondary | ICD-10-CM | POA: Diagnosis not present

## 2015-07-16 DIAGNOSIS — I10 Essential (primary) hypertension: Secondary | ICD-10-CM | POA: Diagnosis not present

## 2015-07-16 LAB — BAYER DCA HB A1C WAIVED: HB A1C: 6.9 % (ref <7.0)

## 2015-07-16 MED ORDER — TELMISARTAN 80 MG PO TABS: 80.0000 mg | ORAL_TABLET | Freq: Every day | ORAL | Status: DC

## 2015-07-16 NOTE — Assessment & Plan Note (Signed)
The current medical regimen is effective;  continue present plan and medications.  

## 2015-07-16 NOTE — Assessment & Plan Note (Signed)
Discuss hypertension and pressure elevated today will increase Micardis from 80 mg a day to 2 tablets a day observe blood pressure response

## 2015-07-16 NOTE — Telephone Encounter (Signed)
Pt went to PCP this am, it was elevated to 160/100, Dr. Jeananne Rama increased his Telmisartan 80 mg, to 160 mg once a day. He wants to make sure this is ok with Dr. Caryl Comes. Please call.

## 2015-07-16 NOTE — Progress Notes (Signed)
   BP 163/100 mmHg  Pulse 72  Temp(Src) 97.9 F (36.6 C)  Ht 5' 11.7" (1.821 m)  Wt 209 lb (94.802 kg)  BMI 28.59 kg/m2  SpO2 99%   Subjective:    Patient ID: Ryan [name], male    DOB: Apr 11, 1951, 65 y.o.   [name]: [id number]  HPI: Ryan [name] is a 65 y.o. male  Chief Complaint  Patient presents with  . Diabetes   Diabetes doing well no complaints from medications noted low blood sugar spells had diarrhea taken metformin stopped for couple days then restarted and is done well on 500 mg 1 a day. Blood sugars monitoring low 100s. Blood pressures elevated here today lower at home but still elevated at times Taking medications faithfully no side effects  Relevant past medical, surgical, family and social history reviewed and updated as indicated. Interim medical history since our last visit reviewed. Allergies and medications reviewed and updated.  Review of Systems  Constitutional: Negative.   Respiratory: Negative.   Cardiovascular: Negative.     Per HPI unless specifically indicated above     Objective:    BP 163/100 mmHg  Pulse 72  Temp(Src) 97.9 F (36.6 C)  Ht 5' 11.7" (1.821 m)  Wt 209 lb (94.802 kg)  BMI 28.59 kg/m2  SpO2 99%  Wt Readings from Last 3 Encounters:  07/16/15 209 lb (94.802 kg)  06/24/15 215 lb (97.523 kg)  06/17/15 209 lb (94.802 kg)    Physical Exam  Constitutional: He is oriented to person, place, and time. He appears well-developed and well-nourished. No distress.  HENT:  Head: Normocephalic and atraumatic.  Right Ear: Hearing normal.  Left Ear: Hearing normal.  Nose: Nose normal.  Eyes: Conjunctivae and lids are normal. Right eye exhibits no discharge. Left eye exhibits no discharge. No scleral icterus.  Cardiovascular: Normal rate, regular rhythm and normal heart sounds.   Pulmonary/Chest: Effort normal and breath sounds normal. No respiratory distress.  Musculoskeletal: Normal range of motion.  Neurological: He is alert and  oriented to person, place, and time.  Skin: Skin is intact. No rash noted.  Psychiatric: He has a normal mood and affect. His speech is normal and behavior is normal. Judgment and thought content normal. Cognition and memory are normal.        Assessment & Plan:   Problem List Items Addressed This Visit      Cardiovascular and Mediastinum   Hypertension    Discuss hypertension and pressure elevated today will increase Micardis from 80 mg a day to 2 tablets a day observe blood pressure response       Relevant Medications   telmisartan (MICARDIS) 80 MG tablet     Endocrine   Diabetes mellitus ([place]) - Primary    The current medical regimen is effective;  continue present plan and medications.       Relevant Medications   telmisartan (MICARDIS) 80 MG tablet   Other Relevant Orders   Bayer DCA Hb A1c Waived       Follow up plan: Return in about 3 months (around 10/16/2015) for aic , lipids, alt, ast, BMP.

## 2015-07-16 NOTE — Telephone Encounter (Signed)
Okay given for telmasartan 160 mg once daily

## 2015-08-06 ENCOUNTER — Encounter: Payer: Self-pay | Admitting: Internal Medicine

## 2015-09-01 ENCOUNTER — Other Ambulatory Visit: Payer: Self-pay | Admitting: Internal Medicine

## 2015-09-23 ENCOUNTER — Ambulatory Visit (INDEPENDENT_AMBULATORY_CARE_PROVIDER_SITE_OTHER): Payer: BLUE CROSS/BLUE SHIELD | Admitting: *Deleted

## 2015-09-23 DIAGNOSIS — I429 Cardiomyopathy, unspecified: Secondary | ICD-10-CM

## 2015-09-23 DIAGNOSIS — Z95810 Presence of automatic (implantable) cardiac defibrillator: Secondary | ICD-10-CM | POA: Diagnosis not present

## 2015-09-23 NOTE — Progress Notes (Signed)
Remote ICD transmission.   

## 2015-10-13 LAB — CUP PACEART REMOTE DEVICE CHECK
Battery Voltage: 2.78 V
Brady Statistic AP VP Percent: 30.47 %
Brady Statistic RA Percent Paced: 30.47 %
Date Time Interrogation Session: 20170516133629
HIGH POWER IMPEDANCE MEASURED VALUE: 47 Ohm
HIGH POWER IMPEDANCE MEASURED VALUE: 56 Ohm
HighPow Impedance: 570 Ohm
Implantable Lead Implant Date: 20060505
Implantable Lead Implant Date: 20120111
Implantable Lead Location: 753860
Implantable Lead Model: 6947
Lead Channel Impedance Value: 399 Ohm
Lead Channel Impedance Value: 4047 Ohm
Lead Channel Impedance Value: 4047 Ohm
Lead Channel Pacing Threshold Amplitude: 0.5 V
Lead Channel Pacing Threshold Pulse Width: 0.4 ms
Lead Channel Sensing Intrinsic Amplitude: 1.625 mV
Lead Channel Sensing Intrinsic Amplitude: 12.125 mV
Lead Channel Setting Pacing Amplitude: 2 V
Lead Channel Setting Pacing Pulse Width: 0.4 ms
MDC IDC LEAD LOCATION: 753859
MDC IDC MSMT LEADCHNL LV IMPEDANCE VALUE: 4047 Ohm
MDC IDC MSMT LEADCHNL RA PACING THRESHOLD AMPLITUDE: 0.625 V
MDC IDC MSMT LEADCHNL RA SENSING INTR AMPL: 1.625 mV
MDC IDC MSMT LEADCHNL RV IMPEDANCE VALUE: 589 Ohm
MDC IDC MSMT LEADCHNL RV PACING THRESHOLD PULSEWIDTH: 0.4 ms
MDC IDC MSMT LEADCHNL RV SENSING INTR AMPL: 12.125 mV
MDC IDC SET LEADCHNL RV PACING AMPLITUDE: 2.5 V
MDC IDC SET LEADCHNL RV SENSING SENSITIVITY: 0.3 mV
MDC IDC STAT BRADY AP VS PERCENT: 0 %
MDC IDC STAT BRADY AS VP PERCENT: 69.53 %
MDC IDC STAT BRADY AS VS PERCENT: 0 %
MDC IDC STAT BRADY RV PERCENT PACED: 100 %

## 2015-10-23 ENCOUNTER — Encounter: Payer: Self-pay | Admitting: Cardiology

## 2015-10-28 ENCOUNTER — Encounter: Payer: Self-pay | Admitting: Family Medicine

## 2015-10-28 ENCOUNTER — Ambulatory Visit (INDEPENDENT_AMBULATORY_CARE_PROVIDER_SITE_OTHER): Payer: Medicare Other | Admitting: Family Medicine

## 2015-10-28 VITALS — BP 138/80 | HR 71 | Temp 97.9°F | Ht 71.7 in | Wt 211.0 lb

## 2015-10-28 DIAGNOSIS — R946 Abnormal results of thyroid function studies: Secondary | ICD-10-CM

## 2015-10-28 DIAGNOSIS — I10 Essential (primary) hypertension: Secondary | ICD-10-CM | POA: Diagnosis not present

## 2015-10-28 DIAGNOSIS — M1000 Idiopathic gout, unspecified site: Secondary | ICD-10-CM

## 2015-10-28 DIAGNOSIS — R7989 Other specified abnormal findings of blood chemistry: Secondary | ICD-10-CM

## 2015-10-28 DIAGNOSIS — E785 Hyperlipidemia, unspecified: Secondary | ICD-10-CM

## 2015-10-28 DIAGNOSIS — E119 Type 2 diabetes mellitus without complications: Secondary | ICD-10-CM

## 2015-10-28 LAB — LP+ALT+AST PICCOLO, WAIVED
ALT (SGPT) Piccolo, Waived: 70 U/L — ABNORMAL HIGH (ref 10–47)
AST (SGOT) Piccolo, Waived: 42 U/L — ABNORMAL HIGH (ref 11–38)
CHOLESTEROL PICCOLO, WAIVED: 157 mg/dL (ref <200)
Chol/HDL Ratio Piccolo,Waive: 4.5 mg/dL
HDL CHOL PICCOLO, WAIVED: 35 mg/dL — AB (ref >59)
LDL CHOL CALC PICCOLO WAIVED: 66 mg/dL (ref <100)
TRIGLYCERIDES PICCOLO,WAIVED: 282 mg/dL — AB (ref <150)
VLDL CHOL CALC PICCOLO,WAIVE: 56 mg/dL — AB (ref <30)

## 2015-10-28 LAB — BAYER DCA HB A1C WAIVED: HB A1C (BAYER DCA - WAIVED): 7.2 % — ABNORMAL HIGH (ref <7.0)

## 2015-10-28 MED ORDER — SITAGLIPTIN PHOSPHATE 100 MG PO TABS: 100.0000 mg | ORAL_TABLET | Freq: Every day | ORAL | Status: DC

## 2015-10-28 NOTE — Assessment & Plan Note (Signed)
The current medical regimen is effective;  continue present plan and medications.  

## 2015-10-28 NOTE — Assessment & Plan Note (Signed)
Discontinue metformin as it is causing persistent diarrhea. Will start on Januvia.

## 2015-10-28 NOTE — Progress Notes (Signed)
BP 138/80 mmHg  Pulse 71  Temp(Src) 97.9 F (36.6 C)  Ht 5' 11.7" (1.821 m)  Wt 211 lb (95.709 kg)  BMI 28.86 kg/m2  SpO2 99%   Subjective:    Patient ID: Ryan [name], male    DOB: 12/29/1950, 65 y.o.   [name]: [id number]  HPI: Ryan [name] is a 65 y.o. male  Chief Complaint  Patient presents with  . Diabetes  . Hypertension  . Hyperlipidemia  . dizziness with nausea yesterday    out in heat x 2hrs, not drinking water   Patient presents for routine follow up. Had a dizzy spell yesterday while out in the heat, thinks he didn't drink enough water all day.   HTN - BPs doing well, no concerns with current regimen. Stayed at one tablet daily instead of BID metoprolol. Checking occasionally at home, readings are WNL DM - Having frequent diarrhea from metformin that is not resolving. HLD - Diet controlled Gout - Doing well on uloric  Relevant past medical, surgical, family and social history reviewed and updated as indicated. Interim medical history since our last visit reviewed. Allergies and medications reviewed and updated.  Review of Systems  Constitutional: Negative.   HENT: Negative.   Eyes: Negative.   Respiratory: Negative.   Cardiovascular: Negative.   Gastrointestinal: Negative.   Endocrine: Negative.   Genitourinary: Negative.   Musculoskeletal: Negative.   Skin: Negative.   Allergic/Immunologic: Negative.   Neurological: Negative.   Hematological: Negative.   Psychiatric/Behavioral: Negative.     Per HPI unless specifically indicated above     Objective:    BP 138/80 mmHg  Pulse 71  Temp(Src) 97.9 F (36.6 C)  Ht 5' 11.7" (1.821 m)  Wt 211 lb (95.709 kg)  BMI 28.86 kg/m2  SpO2 99%  Wt Readings from Last 3 Encounters:  10/28/15 211 lb (95.709 kg)  07/16/15 209 lb (94.802 kg)  06/24/15 215 lb (97.523 kg)    Physical Exam  Constitutional: He is oriented to person, place, and time. He appears well-developed and well-nourished. No distress.   HENT:  Head: Normocephalic and atraumatic.  Right Ear: Hearing normal.  Left Ear: Hearing normal.  Nose: Nose normal.  Eyes: Conjunctivae and lids are normal. Right eye exhibits no discharge. Left eye exhibits no discharge. No scleral icterus.  Cardiovascular: Normal rate and regular rhythm.   Pulmonary/Chest: Effort normal and breath sounds normal. No respiratory distress.  Musculoskeletal: Normal range of motion.  Neurological: He is alert and oriented to person, place, and time.  Skin: Skin is intact. No rash noted.  Psychiatric: He has a normal mood and affect. His speech is normal and behavior is normal. Judgment and thought content normal. Cognition and memory are normal.        Assessment & Plan:   Problem List Items Addressed This Visit      Cardiovascular and Mediastinum   Hypertension    The current medical regimen is effective;  continue present plan and medications.       Relevant Orders   Basic metabolic panel     Endocrine   Diabetes mellitus ([place]) - Primary    Discontinue metformin as it is causing persistent diarrhea. Will start on Januvia.       Relevant Medications   sitaGLIPtin (JANUVIA) 100 MG tablet   Other Relevant Orders   Bayer DCA Hb A1c Waived     Other   Hyperlipidemia   Relevant Orders   [address] Waived  Gout    The current medical regimen is effective;  continue present plan and medications.        Other Visit Diagnoses    Elevated TSH            Follow up plan: Return in about 3 months (around 01/28/2016) for A1C.

## 2015-10-29 LAB — TSH: TSH: 4.73 u[IU]/mL — AB (ref 0.450–4.500)

## 2015-10-29 LAB — BASIC METABOLIC PANEL
BUN / CREAT RATIO: 13 (ref 10–24)
BUN: 16 mg/dL (ref 8–27)
CO2: 18 mmol/L (ref 18–29)
CREATININE: 1.19 mg/dL (ref 0.76–1.27)
Calcium: 9.5 mg/dL (ref 8.6–10.2)
Chloride: 103 mmol/L (ref 96–106)
GFR calc Af Amer: 74 mL/min/{1.73_m2} (ref >59)
GFR calc non Af Amer: 64 mL/min/{1.73_m2} (ref >59)
GLUCOSE: 220 mg/dL — AB (ref 65–99)
POTASSIUM: 4.2 mmol/L (ref 3.5–5.2)
SODIUM: 142 mmol/L (ref 134–144)

## 2015-10-29 NOTE — Telephone Encounter (Signed)
Phone call discuss TSH slight elevation but less so than before will observe

## 2015-12-12 ENCOUNTER — Telehealth: Payer: Self-pay | Admitting: Family Medicine

## 2015-12-12 NOTE — Telephone Encounter (Signed)
[facility] called stated pt's insurance will no longer cover Telmisartan. Pharmacy would like to know if this can be changed. Please call [address] to follow up pt has enough medication to get him through the weekend. Thanks.

## 2015-12-15 ENCOUNTER — Other Ambulatory Visit: Payer: Self-pay | Admitting: Family Medicine

## 2015-12-15 MED ORDER — LOSARTAN POTASSIUM 100 MG PO TABS: 100.0000 mg | ORAL_TABLET | Freq: Every day | ORAL | 3 refills | Status: DC

## 2015-12-22 ENCOUNTER — Other Ambulatory Visit: Payer: Self-pay | Admitting: Family Medicine

## 2015-12-23 ENCOUNTER — Ambulatory Visit (INDEPENDENT_AMBULATORY_CARE_PROVIDER_SITE_OTHER): Payer: Medicare Other | Admitting: *Deleted

## 2015-12-23 DIAGNOSIS — Z95810 Presence of automatic (implantable) cardiac defibrillator: Secondary | ICD-10-CM | POA: Diagnosis not present

## 2015-12-23 DIAGNOSIS — I429 Cardiomyopathy, unspecified: Secondary | ICD-10-CM

## 2015-12-23 NOTE — Progress Notes (Signed)
Remote ICD transmission.   

## 2015-12-24 ENCOUNTER — Encounter: Payer: Self-pay | Admitting: Cardiology

## 2016-01-13 LAB — CUP PACEART REMOTE DEVICE CHECK
Brady Statistic AP VP Percent: 39.19 %
Brady Statistic AP VS Percent: 0.04 %
Brady Statistic AS VP Percent: 60.76 %
HIGH POWER IMPEDANCE MEASURED VALUE: 589 Ohm
HighPow Impedance: 52 Ohm
HighPow Impedance: 64 Ohm
Implantable Lead Implant Date: 20060505
Implantable Lead Location: 753859
Implantable Lead Model: 5076
Lead Channel Impedance Value: 4047 Ohm
Lead Channel Impedance Value: 4047 Ohm
Lead Channel Impedance Value: 665 Ohm
Lead Channel Pacing Threshold Pulse Width: 0.4 ms
Lead Channel Sensing Intrinsic Amplitude: 0.875 mV
Lead Channel Sensing Intrinsic Amplitude: 0.875 mV
MDC IDC LEAD IMPLANT DT: 20120111
MDC IDC LEAD LOCATION: 753860
MDC IDC MSMT BATTERY VOLTAGE: 2.69 V
MDC IDC MSMT LEADCHNL LV IMPEDANCE VALUE: 4047 Ohm
MDC IDC MSMT LEADCHNL RA IMPEDANCE VALUE: 399 Ohm
MDC IDC MSMT LEADCHNL RA PACING THRESHOLD AMPLITUDE: 0.625 V
MDC IDC MSMT LEADCHNL RV PACING THRESHOLD AMPLITUDE: 0.625 V
MDC IDC MSMT LEADCHNL RV PACING THRESHOLD PULSEWIDTH: 0.4 ms
MDC IDC MSMT LEADCHNL RV SENSING INTR AMPL: 12.375 mV
MDC IDC MSMT LEADCHNL RV SENSING INTR AMPL: 12.375 mV
MDC IDC SESS DTM: 20170815132258
MDC IDC SET LEADCHNL RA PACING AMPLITUDE: 2 V
MDC IDC SET LEADCHNL RV PACING AMPLITUDE: 2.5 V
MDC IDC SET LEADCHNL RV PACING PULSEWIDTH: 0.4 ms
MDC IDC SET LEADCHNL RV SENSING SENSITIVITY: 0.3 mV
MDC IDC STAT BRADY AS VS PERCENT: 0.01 %
MDC IDC STAT BRADY RA PERCENT PACED: 39.23 %
MDC IDC STAT BRADY RV PERCENT PACED: 99.96 %

## 2016-01-20 ENCOUNTER — Telehealth: Payer: Self-pay

## 2016-01-20 NOTE — Telephone Encounter (Signed)
Have patient ask [place]

## 2016-01-20 NOTE — Telephone Encounter (Signed)
Pharmacy states patient is in the doughnut hole and wonder if there was something less expensive in place of Januvia

## 2016-01-21 ENCOUNTER — Other Ambulatory Visit: Payer: Self-pay | Admitting: Family Medicine

## 2016-01-21 MED ORDER — GLIPIZIDE 5 MG PO TABS: 5.0000 mg | ORAL_TABLET | Freq: Every day | ORAL | 3 refills | Status: DC

## 2016-01-21 NOTE — Telephone Encounter (Signed)
Changed.

## 2016-01-21 NOTE — Progress Notes (Signed)
Patient requesting change from Januvia to less expensive medicines will try glipizide 5 mg observe blood sugar

## 2016-01-28 ENCOUNTER — Ambulatory Visit: Payer: Medicare Other | Admitting: Family Medicine

## 2016-02-09 ENCOUNTER — Encounter: Payer: Self-pay | Admitting: Family Medicine

## 2016-02-09 ENCOUNTER — Ambulatory Visit (INDEPENDENT_AMBULATORY_CARE_PROVIDER_SITE_OTHER): Payer: Medicare Other | Admitting: Family Medicine

## 2016-02-09 VITALS — BP 158/93 | HR 69 | Wt 214.0 lb

## 2016-02-09 DIAGNOSIS — J019 Acute sinusitis, unspecified: Secondary | ICD-10-CM

## 2016-02-09 DIAGNOSIS — E119 Type 2 diabetes mellitus without complications: Secondary | ICD-10-CM | POA: Diagnosis not present

## 2016-02-09 LAB — MICROALBUMIN, URINE WAIVED
CREATININE, URINE WAIVED: 50 mg/dL (ref 10–300)
MICROALB, UR WAIVED: 10 mg/L (ref 0–19)

## 2016-02-09 LAB — BAYER DCA HB A1C WAIVED: HB A1C: 7.2 % — AB (ref <7.0)

## 2016-02-09 MED ORDER — AZITHROMYCIN 250 MG PO TABS: ORAL_TABLET | ORAL | 0 refills | Status: DC

## 2016-02-09 MED ORDER — HYDROCOD POLST-CPM POLST ER 10-8 MG/5ML PO SUER: 2.5000 mL | Freq: Two times a day (BID) | ORAL | 0 refills | Status: DC | PRN

## 2016-02-09 NOTE — Progress Notes (Signed)
   BP (!) 158/93 (BP Location: Right Arm, Cuff Size: Normal)   Pulse 69   Wt 214 lb (97.1 kg) Comment: with shoes  BMI 29.27 kg/m    Subjective:    Patient ID: Ryan [name], male    DOB: 1951/02/22, 65 y.o.   [name]: [id number]  HPI: Ryan [name] is a 65 y.o. male  Chief Complaint  Patient presents with  . Diabetes  Patient doing well diabetes was unable to take Invokanna and related due to insurance costs. Is on glipizide seems to be doing well no complaints has taken only about 2 weeks. No noticed difference in blood sugars.  Patient with marked sinus congestion drainage feeling bad spent ongoing for 2 weeks  Blood pressure doing well no complaints  Relevant past medical, surgical, family and social history reviewed and updated as indicated. Interim medical history since our last visit reviewed. Allergies and medications reviewed and updated.  Review of Systems  Constitutional: Positive for fatigue. Negative for fever.  HENT: Positive for congestion, rhinorrhea, sinus pressure and sneezing.   Respiratory: Negative.   Cardiovascular: Negative.     Per HPI unless specifically indicated above     Objective:    BP (!) 158/93 (BP Location: Right Arm, Cuff Size: Normal)   Pulse 69   Wt 214 lb (97.1 kg) Comment: with shoes  BMI 29.27 kg/m   Wt Readings from Last 3 Encounters:  02/09/16 214 lb (97.1 kg)  10/28/15 211 lb (95.7 kg)  07/16/15 209 lb (94.8 kg)    Physical Exam  Constitutional: He is oriented to person, place, and time. He appears well-developed and well-nourished. No distress.  HENT:  Head: Normocephalic and atraumatic.  Right Ear: Hearing normal.  Left Ear: Hearing normal.  Nose: Nose normal.  Eyes: Conjunctivae and lids are normal. Right eye exhibits no discharge. Left eye exhibits no discharge. No scleral icterus.  Cardiovascular: Normal rate, regular rhythm and normal heart sounds.   Pulmonary/Chest: Effort normal and breath sounds normal. No  respiratory distress.  Musculoskeletal: Normal range of motion.  Neurological: He is alert and oriented to person, place, and time.  Skin: Skin is intact. No rash noted.  Psychiatric: He has a normal mood and affect. His speech is normal and behavior is normal. Judgment and thought content normal. Cognition and memory are normal.        Assessment & Plan:   Problem List Items Addressed This Visit      Endocrine   Diabetes mellitus ([place]) - Primary   Relevant Orders   Bayer DCA Hb A1c Waived   Microalbumin, Urine Waived    Other Visit Diagnoses    Acute sinusitis, recurrence not specified, unspecified location       Relevant Medications   chlorpheniramine-HYDROcodone (TUSSIONEX PENNKINETIC ER) 10-8 MG/5ML SUER   azithromycin (ZITHROMAX) 250 MG tablet       Follow up plan: Return in about 3 months (around 05/11/2016) for Physical Exam, Hemoglobin A1c.

## 2016-02-16 ENCOUNTER — Telehealth: Payer: Self-pay | Admitting: Internal Medicine

## 2016-02-16 NOTE — Telephone Encounter (Signed)
Spoke to patient regarding device longevity. I explained to patient that per the remote in August he still had a while before he will need to be changed out. (Battery voltage: 2.69V, ERI is 2.63V). I explained to him that [employer] gave him an estimate in February and that it was not a guarantee. I told him that he would need to wait until elective replacement is indicated, so that it will covered by insurance. I also told him that once he reaches replacement he will still have 3 months before he could lose device function. Patient voiced understanding. Will continue follow up as scheduled.

## 2016-02-16 NOTE — Telephone Encounter (Signed)
Per Dr. Olin Pia notes on 06/24/15: "We will plan to reassess LV function prior to generator replacement in the absence of symptoms that would prompt an earlier."  August 15 remote: "Notes Recorded by Deboraha Sprang, MD on 01/26/2016 at 5:05 PM EDT Device remote reviewed. Remote is normal. Battery status is good. Lead measurements unchanged. Histograms are appropriate."  Forward to Alvis Lemmings, RN

## 2016-02-16 NOTE — Telephone Encounter (Signed)
Cardiology pt. Will forward to cardiology triage.

## 2016-02-16 NOTE — Telephone Encounter (Signed)
Pt calling stating that [employer] told patient in February that he may need a battery change in December this year.  He is wanting to go ahead and try and get this done. He does not want to wait until the holidays  Please advise.

## 2016-03-02 ENCOUNTER — Other Ambulatory Visit: Payer: Self-pay

## 2016-03-02 MED ORDER — GLIPIZIDE 5 MG PO TABS: 5.0000 mg | ORAL_TABLET | Freq: Every day | ORAL | 2 refills | Status: DC

## 2016-03-02 NOTE — Telephone Encounter (Signed)
Insurance company is requesting a 90 day supply on the glipizide.

## 2016-03-04 ENCOUNTER — Telehealth: Payer: Self-pay | Admitting: Internal Medicine

## 2016-03-04 ENCOUNTER — Other Ambulatory Visit: Payer: Self-pay

## 2016-03-04 MED ORDER — METOPROLOL SUCCINATE ER 50 MG PO TB24: 100.0000 mg | ORAL_TABLET | Freq: Every day | ORAL | 3 refills | Status: DC

## 2016-03-04 NOTE — Telephone Encounter (Signed)
Pt reports elevated BP today. 162/92, and 168/88 this morning. Two hours ago, 157/89,   One hour ago 153/82. HR runs 68-72. Pt has ICD.  Last evening, 175/102 therefore, he took extra 1/2 metoprolol w/some improvement.  States Dr.Klein told him once that he could increase metoprolol 100mg  qd if needed but he is unsure if he should do this. He has been experiencing ringing in ears and feeling "a little funny" when BP elevated. He follows a low sodium diet, drinks one beer a day.  Would like to know if he should increase metoprolol. As Dr. Caryl Comes is away, will forward to Christell Faith, PA-C to review and advise.

## 2016-03-04 NOTE — Telephone Encounter (Signed)
Reviewed Ryan's recommendations w/pt who verbalized understanding and is agreeable w/plan. He will monitor HR and BP daily and report if he becomes symptomatic.  Requests refills to be submitted to [facility].

## 2016-03-04 NOTE — Telephone Encounter (Signed)
Pt c/o BP issue: STAT if pt c/o blurred vision, one-sided weakness or slurred speech  1. What are your last 5 BP readings?  This a.m. 162/92, and 168/94-this was when he 1st got up Yesterday was "around" 165/88  2. Are you having any other symptoms (ex. Dizziness, headache, blurred vision, passed out)? Ringing in ears  3. What is your BP issue? elevated

## 2016-03-04 NOTE — Telephone Encounter (Signed)
Ok to increase Toprol XL to 100 mg daily. If heart rate runs less than 60 and symptomatic may need 75 mg daily.

## 2016-03-05 ENCOUNTER — Ambulatory Visit: Payer: Medicare Other | Admitting: Unknown Physician Specialty

## 2016-03-08 ENCOUNTER — Telehealth: Payer: Self-pay | Admitting: Internal Medicine

## 2016-03-08 NOTE — Telephone Encounter (Signed)
Pt reports feeling dizzy with cold sweats 4 hours after taking metoprolol 100mg  Friday. Dosage was increased by Christell Faith, PA-C for elevated BP. His BP 168/92, HR 64 at the time of dizziness and cold sweats. He rested and sx improved.  Since then, he has only taken 50mg  metoprolol qd and continues to experience elevated BP.  He reports today: BP 166/100, HR 76 and 171/95, HR 71.  He does not feel BP has been controlled since coreg and micardis was changed d/t insurance. He would rather pay cash price for coreg and micardis and d/c metoprolol and losartan Pt agreeable to take another 50mg  metoprolol (for a total of 100mg ) now and monitor sx. He will await further advice from Dr. Caryl Comes.  Forward to MD.

## 2016-03-08 NOTE — Telephone Encounter (Signed)
Pt states he doubled his Metoprolol per conversation with the nurse here on Friday. States after he took this, about 4 hours later, he was dizzy, cold sweat, and had to lay down.  This a.m. BP was 166/100 HR was 76. Just now 171/95 HR 71 Please call.

## 2016-03-09 NOTE — Telephone Encounter (Signed)
I would give him a prescription for carvedilol which he can fill at [facility] for 4$/  Month. He can try the losartan for now  before we try to go back to the micards t.

## 2016-03-10 ENCOUNTER — Other Ambulatory Visit: Payer: Self-pay

## 2016-03-10 MED ORDER — CARVEDILOL 25 MG PO TABS: 25.0000 mg | ORAL_TABLET | Freq: Two times a day (BID) | ORAL | 3 refills | Status: DC

## 2016-03-10 NOTE — Telephone Encounter (Signed)
Reviewed Dr. Olin Pia recommendations w/pt who verbalized understanding and is agreeable w/plan. He repeat back to stop metoprolol, continue losartan and resume coreg.  New coreg prescription submitted to [facility]. He will continue to monitor pressures and call if they remain elevated.  He had no further questions at this time.

## 2016-03-18 ENCOUNTER — Ambulatory Visit: Payer: Medicare Other | Admitting: Family Medicine

## 2016-03-23 ENCOUNTER — Ambulatory Visit (INDEPENDENT_AMBULATORY_CARE_PROVIDER_SITE_OTHER): Payer: Medicare Other | Admitting: *Deleted

## 2016-03-23 DIAGNOSIS — I429 Cardiomyopathy, unspecified: Secondary | ICD-10-CM

## 2016-03-23 DIAGNOSIS — Z95810 Presence of automatic (implantable) cardiac defibrillator: Secondary | ICD-10-CM | POA: Diagnosis not present

## 2016-03-23 NOTE — Progress Notes (Signed)
Remote ICD transmission.   

## 2016-04-02 LAB — CUP PACEART REMOTE DEVICE CHECK
Brady Statistic AP VP Percent: 39.57 %
Brady Statistic AP VS Percent: 0.07 %
Brady Statistic AS VP Percent: 60.33 %
Date Time Interrogation Session: 20171114153118
HighPow Impedance: 45 Ohm
HighPow Impedance: 55 Ohm
HighPow Impedance: 570 Ohm
Implantable Lead Implant Date: 20060505
Implantable Lead Location: 753859
Implantable Lead Model: 5076
Lead Channel Impedance Value: 399 Ohm
Lead Channel Impedance Value: 4047 Ohm
Lead Channel Impedance Value: 4047 Ohm
Lead Channel Impedance Value: 4047 Ohm
Lead Channel Impedance Value: 589 Ohm
Lead Channel Pacing Threshold Amplitude: 0.625 V
Lead Channel Pacing Threshold Pulse Width: 0.4 ms
Lead Channel Sensing Intrinsic Amplitude: 0.875 mV
Lead Channel Sensing Intrinsic Amplitude: 0.875 mV
MDC IDC LEAD IMPLANT DT: 20120111
MDC IDC LEAD LOCATION: 753860
MDC IDC MSMT BATTERY VOLTAGE: 2.64 V
MDC IDC MSMT LEADCHNL RV PACING THRESHOLD AMPLITUDE: 0.625 V
MDC IDC MSMT LEADCHNL RV PACING THRESHOLD PULSEWIDTH: 0.4 ms
MDC IDC MSMT LEADCHNL RV SENSING INTR AMPL: 12.5 mV
MDC IDC MSMT LEADCHNL RV SENSING INTR AMPL: 12.5 mV
MDC IDC PG IMPLANT DT: 20120111
MDC IDC SET LEADCHNL RA PACING AMPLITUDE: 2 V
MDC IDC SET LEADCHNL RV PACING AMPLITUDE: 2.5 V
MDC IDC SET LEADCHNL RV PACING PULSEWIDTH: 0.4 ms
MDC IDC SET LEADCHNL RV SENSING SENSITIVITY: 0.3 mV
MDC IDC STAT BRADY AS VS PERCENT: 0.03 %
MDC IDC STAT BRADY RA PERCENT PACED: 39.64 %
MDC IDC STAT BRADY RV PERCENT PACED: 99.9 %

## 2016-04-06 ENCOUNTER — Telehealth: Payer: Self-pay | Admitting: Internal Medicine

## 2016-04-06 NOTE — Telephone Encounter (Signed)
I called and spoke with the patient. He woke up with ringing in his ears this morning- at 7am BP was 169/44 HR- 68 He took his meds about 8:30 am- BP at 9am was 171/92 HR- 67 He states that he was on micardis and coreg at one time and the micardis was stopped due to insurance coverage. He is now on Medicare. He is currently on coreg 25 mg BID & losartan 100 mg QD.  BP later this afternoon was 145/88. On average, he thinks he runs about 145-150/82-88. The patient reports he did eat a pizza last night. I advised the patient to continue his current meds for now- I will review with Dr. Caryl Comes on Thursday- advised to monitor BP tomorrow, but not to check until after he takes his meds unless he is feeling bad. He has f/u with Dr. Jeananne Rama next week- I advised Dr. Caryl Comes may want him to continue his current meds until seen by his PCP next week, but I will check with him and call back. The patient is agreeable.

## 2016-04-06 NOTE — Telephone Encounter (Signed)
Pt calling stating he is still having some High bp issues This morning it was 169/94 HR 68  He is having some ringing in his ears . Would like to know what to do, for this is been going on for a while like this and we even changed his meds around

## 2016-04-08 NOTE — Telephone Encounter (Signed)
I called and spoke with the patient- I advised him of Dr. Olin Pia recommendations and that he would prefer that Dr. Jeananne Rama manage his BP at this time. I advised I will forward a copy of these recommendations to Dr. Jeananne Rama in preparation for the patient's appointment with him on 04/14/16.  The patient is agreeable with this plan. I have advised him to continue to monitor his BP no more than twice a day unless he just isn't feeling well, but to make sure he waits at least 30 min-1 hour prior to the first reading. He voices understanding.

## 2016-04-08 NOTE — Telephone Encounter (Signed)
With mild LV dysfunciton dr Jeananne Rama could consider aldactone-- with close attention to K levels. The other thing to rememeber with DM his target BP is about 135-145

## 2016-04-14 ENCOUNTER — Ambulatory Visit (INDEPENDENT_AMBULATORY_CARE_PROVIDER_SITE_OTHER): Payer: Medicare Other | Admitting: Family Medicine

## 2016-04-14 ENCOUNTER — Encounter: Payer: Self-pay | Admitting: Family Medicine

## 2016-04-14 VITALS — BP 180/99 | HR 65 | Temp 97.7°F | Ht 71.75 in | Wt 217.4 lb

## 2016-04-14 DIAGNOSIS — I10 Essential (primary) hypertension: Secondary | ICD-10-CM | POA: Diagnosis not present

## 2016-04-14 DIAGNOSIS — M1000 Idiopathic gout, unspecified site: Secondary | ICD-10-CM

## 2016-04-14 DIAGNOSIS — E7800 Pure hypercholesterolemia, unspecified: Secondary | ICD-10-CM | POA: Diagnosis not present

## 2016-04-14 DIAGNOSIS — E119 Type 2 diabetes mellitus without complications: Secondary | ICD-10-CM

## 2016-04-14 DIAGNOSIS — I429 Cardiomyopathy, unspecified: Secondary | ICD-10-CM

## 2016-04-14 DIAGNOSIS — I428 Other cardiomyopathies: Secondary | ICD-10-CM | POA: Diagnosis not present

## 2016-04-14 DIAGNOSIS — N400 Enlarged prostate without lower urinary tract symptoms: Secondary | ICD-10-CM | POA: Insufficient documentation

## 2016-04-14 LAB — URINALYSIS, ROUTINE W REFLEX MICROSCOPIC
BILIRUBIN UA: NEGATIVE
KETONES UA: NEGATIVE
LEUKOCYTES UA: NEGATIVE
NITRITE UA: NEGATIVE
PH UA: 5.5 (ref 5.0–7.5)
Protein, UA: NEGATIVE
RBC UA: NEGATIVE
SPEC GRAV UA: 1.01 (ref 1.005–1.030)
Urobilinogen, Ur: 0.2 mg/dL (ref 0.2–1.0)

## 2016-04-14 LAB — MICROSCOPIC EXAMINATION
BACTERIA UA: NONE SEEN
EPITHELIAL CELLS (NON RENAL): NONE SEEN /HPF (ref 0–10)
RBC MICROSCOPIC, UA: NONE SEEN /HPF (ref 0–?)
WBC, UA: NONE SEEN /hpf (ref 0–?)

## 2016-04-14 MED ORDER — FEBUXOSTAT 40 MG PO TABS: 40.0000 mg | ORAL_TABLET | Freq: Every day | ORAL | 4 refills | Status: DC

## 2016-04-14 MED ORDER — GLIPIZIDE 5 MG PO TABS: 5.0000 mg | ORAL_TABLET | Freq: Every day | ORAL | 4 refills | Status: DC

## 2016-04-14 MED ORDER — SPIRONOLACTONE 25 MG PO TABS: 25.0000 mg | ORAL_TABLET | Freq: Every day | ORAL | 1 refills | Status: DC

## 2016-04-14 MED ORDER — LOSARTAN POTASSIUM 100 MG PO TABS: 100.0000 mg | ORAL_TABLET | Freq: Every day | ORAL | 4 refills | Status: DC

## 2016-04-14 MED ORDER — CARVEDILOL 25 MG PO TABS: 25.0000 mg | ORAL_TABLET | Freq: Two times a day (BID) | ORAL | 4 refills | Status: DC

## 2016-04-14 MED ORDER — RANITIDINE HCL 300 MG PO TABS: 300.0000 mg | ORAL_TABLET | Freq: Every day | ORAL | 4 refills | Status: DC

## 2016-04-14 MED ORDER — DEXLANSOPRAZOLE 60 MG PO CPDR: 60.0000 mg | DELAYED_RELEASE_CAPSULE | Freq: Every day | ORAL | 4 refills | Status: DC

## 2016-04-14 NOTE — Assessment & Plan Note (Signed)
The current medical regimen is effective;  continue present plan and medications.  

## 2016-04-14 NOTE — Assessment & Plan Note (Signed)
Blood pressure poor control will start Aldactone follow-up in 3-4 weeks check BMP Discussed-diet nutrition exercise.

## 2016-04-14 NOTE — Assessment & Plan Note (Signed)
Stable followed by cardiology

## 2016-04-14 NOTE — Progress Notes (Addendum)
 BP (!) 180/99 (BP Location: Left Arm, Patient Position: Sitting, Cuff Size: Normal)   Pulse 65   Temp 97.7 F (36.5 C)   Ht 5' 11.75" (1.822 m)   Wt 217 lb 6.4 oz (98.6 kg)   SpO2 97%   BMI 29.69 kg/m    Subjective:    Patient ID: Ryan [name], male    DOB: 08/09/1950, 65 y.o.   [name]: [id number]  HPI: Ryan [name] is a 65 y.o. male  Chief Complaint  Patient presents with  . Welcome To Medicare  . Annual Exam  . Hypertension    Dr. Klein (patient's cardiologist) asks for you to manage his BP meds. See telephone note from Dr. Klein 04/06/2016  AWV metrics met Patient with problems with hypertension has had some tinnitus as an associated symptom. Carvedilol was initially increased to 25 twice a day with no real results and blood pressure. Cardiology recommended and Aldactone with close attention to potassium because of patient's mild CHF changes. Home blood pressure monitoring indicating blood pressure ranges averaging around 170 or so systolic. Tinnitus has continued.  Otherwise doing well as other chronic medical condition stable no gout symptoms.  Relevant past medical, surgical, family and social history reviewed and updated as indicated. Interim medical history since our last visit reviewed. Allergies and medications reviewed and updated.  Review of Systems  Constitutional: Negative.   HENT: Negative.   Eyes: Negative.   Respiratory: Negative.   Cardiovascular: Negative.   Gastrointestinal: Negative.   Endocrine: Negative.   Genitourinary: Negative.   Musculoskeletal: Negative.   Skin: Negative.   Allergic/Immunologic: Negative.   Neurological: Negative.   Hematological: Negative.   Psychiatric/Behavioral: Negative.     Per HPI unless specifically indicated above     Objective:    BP (!) 180/99 (BP Location: Left Arm, Patient Position: Sitting, Cuff Size: Normal)   Pulse 65   Temp 97.7 F (36.5 C)   Ht 5' 11.75" (1.822 m)   Wt 217 lb 6.4 oz (98.6 kg)    SpO2 97%   BMI 29.69 kg/m   Wt Readings from Last 3 Encounters:  04/14/16 217 lb 6.4 oz (98.6 kg)  02/09/16 214 lb (97.1 kg)  10/28/15 211 lb (95.7 kg)    Physical Exam  Constitutional: He is oriented to person, place, and time. He appears well-developed and well-nourished. No distress.  HENT:  Head: Normocephalic and atraumatic.  Right Ear: Hearing and external ear normal.  Left Ear: Hearing and external ear normal.  Nose: Nose normal.  Eyes: Conjunctivae, EOM and lids are normal. Pupils are equal, round, and reactive to light. Right eye exhibits no discharge. Left eye exhibits no discharge. No scleral icterus.  Neck: Normal range of motion. Neck supple.  Cardiovascular: Normal rate, regular rhythm, normal heart sounds and intact distal pulses.   Pulmonary/Chest: Effort normal and breath sounds normal. No respiratory distress.  Abdominal: Soft. Bowel sounds are normal. There is no splenomegaly or hepatomegaly.  Genitourinary: Rectum normal and penis normal.  Genitourinary Comments: Mild BPH changes  Musculoskeletal: Normal range of motion.  Neurological: He is alert and oriented to person, place, and time. He has normal reflexes.  Skin: Skin is intact. No rash noted. No erythema.  Psychiatric: He has a normal mood and affect. His speech is normal and behavior is normal. Judgment and thought content normal. Cognition and memory are normal.    Results for orders placed or performed in visit on 03/23/16  Implantable device - remote    Result Value Ref Range   Date Time Interrogation Session [phone]    Pulse Generator Manufacturer MERM    Pulse Gen Model [id number] Protecta XT CRT-D    Pulse Gen Serial Number [id number]    Clinic Name [facility]    Implantable Pulse Generator Type Cardiac Resynch Therapy Defibulator    Implantable Pulse Generator Implant Date [id number]    Implantable Lead Manufacturer [facility]    Implantable Lead Model [phone] Sprint Quattro Secure    Implantable  Lead Serial Number [id number]    Implantable Lead Implant Date [id number]    Implantable Lead Location [id number]    Implantable Lead Manufacturer MERM    Implantable Lead Model 5076 CapSureFix Novus    Implantable Lead Serial Number [id number]    Implantable Lead Implant Date [id number]    Implantable Lead Location [id number]    Lead Channel Setting Sensing Sensitivity 0.3 mV   Lead Channel Setting Pacing Amplitude 2 V   Lead Channel Setting Pacing Pulse Width 0.4 ms   Lead Channel Setting Pacing Amplitude 2.5 V   Lead Channel Impedance Value 399 ohm   Lead Channel Sensing Intrinsic Amplitude 0.875 mV   Lead Channel Sensing Intrinsic Amplitude 0.875 mV   Lead Channel Pacing Threshold Amplitude 0.625 V   Lead Channel Pacing Threshold Pulse Width 0.4 ms   Lead Channel Impedance Value 589 ohm   Lead Channel Sensing Intrinsic Amplitude 12.5 mV   Lead Channel Sensing Intrinsic Amplitude 12.5 mV   Lead Channel Pacing Threshold Amplitude 0.625 V   Lead Channel Pacing Threshold Pulse Width 0.4 ms   HighPow Impedance 570 ohm   HighPow Impedance 45 ohm   HighPow Impedance 55 ohm   Lead Channel Impedance Value 4,047 ohm   Lead Channel Impedance Value 4,047 ohm   Lead Channel Impedance Value 4,047 ohm   Battery Status OK    Battery Voltage 2.64 V   Brady Statistic RA Percent Paced 39.64 %   Brady Statistic RV Percent Paced 99.90 %   Brady Statistic AP VP Percent 39.57 %   Brady Statistic AS VP Percent 60.33 %   Brady Statistic AP VS Percent 0.07 %   Brady Statistic AS VS Percent 0.03 %   Eval Rhythm ApVp       Assessment & Plan:   Problem List Items Addressed This Visit      Cardiovascular and Mediastinum   Idiopathic cardiomyopathy ([place]) - Primary    Stable followed by cardiology.      Relevant Medications   spironolactone (ALDACTONE) 25 MG tablet   losartan (COZAAR) 100 MG tablet   carvedilol (COREG) 25 MG tablet   Other Relevant Orders   CBC with Differential/Platelet   TSH    Hypertension    Blood pressure poor control will start Aldactone follow-up in 3-4 weeks check BMP Discussed-diet nutrition exercise.      Relevant Medications   spironolactone (ALDACTONE) 25 MG tablet   losartan (COZAAR) 100 MG tablet   carvedilol (COREG) 25 MG tablet   Other Relevant Orders   Comprehensive metabolic panel   CBC with Differential/Platelet   TSH   Urinalysis, Routine w reflex microscopic     Endocrine   Diabetes mellitus ([place])    The current medical regimen is effective;  continue present plan and medications.       Relevant Medications   glipiZIDE (GLUCOTROL) 5 MG tablet   losartan (COZAAR) 100 MG tablet   Other Relevant Orders   Comprehensive metabolic panel   CBC  with Differential/Platelet   TSH     Genitourinary   BPH (benign prostatic hyperplasia)   Relevant Orders   PSA     Other   Hyperlipidemia    The current medical regimen is effective;  continue present plan and medications.       Relevant Medications   spironolactone (ALDACTONE) 25 MG tablet   losartan (COZAAR) 100 MG tablet   carvedilol (COREG) 25 MG tablet   Other Relevant Orders   Lipid panel   CBC with Differential/Platelet   TSH   Gout   Relevant Orders   Uric acid     welcome to medicare metrics met  Follow up plan: Return in about 4 weeks (around 05/12/2016) for BMP.  Phone call December 7 discussed TSH slightly rising will recheck late winter or early spring

## 2016-04-15 ENCOUNTER — Encounter: Payer: Self-pay | Admitting: Family Medicine

## 2016-04-15 LAB — COMPREHENSIVE METABOLIC PANEL
A/G RATIO: 1.8 (ref 1.2–2.2)
ALBUMIN: 4.5 g/dL (ref 3.6–4.8)
ALK PHOS: 53 IU/L (ref 39–117)
ALT: 46 IU/L — ABNORMAL HIGH (ref 0–44)
AST: 28 IU/L (ref 0–40)
BILIRUBIN TOTAL: 0.5 mg/dL (ref 0.0–1.2)
BUN / CREAT RATIO: 12 (ref 10–24)
BUN: 13 mg/dL (ref 8–27)
CHLORIDE: 104 mmol/L (ref 96–106)
CO2: 21 mmol/L (ref 18–29)
Calcium: 9.4 mg/dL (ref 8.6–10.2)
Creatinine, Ser: 1.11 mg/dL (ref 0.76–1.27)
GFR calc non Af Amer: 69 mL/min/{1.73_m2} (ref >59)
GFR, EST AFRICAN AMERICAN: 80 mL/min/{1.73_m2} (ref >59)
GLOBULIN, TOTAL: 2.5 g/dL (ref 1.5–4.5)
Glucose: 128 mg/dL — ABNORMAL HIGH (ref 65–99)
POTASSIUM: 4.1 mmol/L (ref 3.5–5.2)
SODIUM: 143 mmol/L (ref 134–144)
TOTAL PROTEIN: 7 g/dL (ref 6.0–8.5)

## 2016-04-15 LAB — CBC WITH DIFFERENTIAL/PLATELET
Basophils Absolute: 0.1 10*3/uL (ref 0.0–0.2)
Basos: 1 %
EOS (ABSOLUTE): 0.2 10*3/uL (ref 0.0–0.4)
EOS: 3 %
HEMATOCRIT: 42.4 % (ref 37.5–51.0)
Hemoglobin: 14.5 g/dL (ref 13.0–17.7)
IMMATURE GRANULOCYTES: 0 %
Immature Grans (Abs): 0 10*3/uL (ref 0.0–0.1)
LYMPHS: 34 %
Lymphocytes Absolute: 2.1 10*3/uL (ref 0.7–3.1)
MCH: 30.8 pg (ref 26.6–33.0)
MCHC: 34.2 g/dL (ref 31.5–35.7)
MCV: 90 fL (ref 79–97)
MONOS ABS: 0.4 10*3/uL (ref 0.1–0.9)
Monocytes: 7 %
NEUTROS PCT: 55 %
Neutrophils Absolute: 3.4 10*3/uL (ref 1.4–7.0)
PLATELETS: 205 10*3/uL (ref 150–379)
RBC: 4.71 x10E6/uL (ref 4.14–5.80)
RDW: 13.6 % (ref 12.3–15.4)
WBC: 6.2 10*3/uL (ref 3.4–10.8)

## 2016-04-15 LAB — URIC ACID: Uric Acid: 2.7 mg/dL — ABNORMAL LOW (ref 3.7–8.6)

## 2016-04-15 LAB — LIPID PANEL
Chol/HDL Ratio: 4.5 ratio units (ref 0.0–5.0)
Cholesterol, Total: 150 mg/dL (ref 100–199)
HDL: 33 mg/dL — ABNORMAL LOW (ref >39)
LDL Calculated: 68 mg/dL (ref 0–99)
Triglycerides: 245 mg/dL — ABNORMAL HIGH (ref 0–149)
VLDL CHOLESTEROL CAL: 49 mg/dL — AB (ref 5–40)

## 2016-04-15 LAB — TSH: TSH: 5.44 u[IU]/mL — AB (ref 0.450–4.500)

## 2016-04-15 LAB — PSA: Prostate Specific Ag, Serum: 1.2 ng/mL (ref 0.0–4.0)

## 2016-04-29 ENCOUNTER — Ambulatory Visit (INDEPENDENT_AMBULATORY_CARE_PROVIDER_SITE_OTHER): Payer: Medicare Other | Admitting: Family Medicine

## 2016-04-29 ENCOUNTER — Encounter: Payer: Self-pay | Admitting: Family Medicine

## 2016-04-29 VITALS — BP 136/80 | HR 83 | Temp 97.6°F | Wt 218.0 lb

## 2016-04-29 DIAGNOSIS — I428 Other cardiomyopathies: Secondary | ICD-10-CM | POA: Diagnosis not present

## 2016-04-29 DIAGNOSIS — I5022 Chronic systolic (congestive) heart failure: Secondary | ICD-10-CM | POA: Diagnosis not present

## 2016-04-29 DIAGNOSIS — I10 Essential (primary) hypertension: Secondary | ICD-10-CM

## 2016-04-29 DIAGNOSIS — I429 Cardiomyopathy, unspecified: Secondary | ICD-10-CM

## 2016-04-29 MED ORDER — SPIRONOLACTONE 25 MG PO TABS: 25.0000 mg | ORAL_TABLET | Freq: Every day | ORAL | 2 refills | Status: DC

## 2016-04-29 NOTE — Progress Notes (Signed)
BP 136/80 (BP Location: Left Arm)   Pulse 83   Temp 97.6 F (36.4 C)   Wt 218 lb (98.9 kg)   SpO2 100%   BMI 29.77 kg/m    Subjective:    Patient ID: Ryan [name], male    DOB: 1951-04-21, 65 y.o.   [name]: [id number]  HPI: DENY ANZUALDA is a 65 y.o. male  Chief Complaint  Patient presents with  . Hypertension  Patient taking spironolactone with no problems has noticed no change in fluid level blood pressures dropped nicely no lightheadedness or dizzy spells feels normal. No chest pain chest tightness no shortness of breath PND orthopnea no other CHF type symptoms. Patient waiting on new battery(defibrillator  Relevant past medical, surgical, family and social history reviewed and updated as indicated. Interim medical history since our last visit reviewed. Allergies and medications reviewed and updated.  Review of Systems  Constitutional: Negative.   Respiratory: Negative.   Cardiovascular: Negative.     Per HPI unless specifically indicated above     Objective:    BP 136/80 (BP Location: Left Arm)   Pulse 83   Temp 97.6 F (36.4 C)   Wt 218 lb (98.9 kg)   SpO2 100%   BMI 29.77 kg/m   Wt Readings from Last 3 Encounters:  04/29/16 218 lb (98.9 kg)  04/14/16 217 lb 6.4 oz (98.6 kg)  02/09/16 214 lb (97.1 kg)    Physical Exam  Constitutional: He is oriented to person, place, and time. He appears well-developed and well-nourished. No distress.  HENT:  Head: Normocephalic and atraumatic.  Right Ear: Hearing normal.  Left Ear: Hearing normal.  Nose: Nose normal.  Eyes: Conjunctivae and lids are normal. Right eye exhibits no discharge. Left eye exhibits no discharge. No scleral icterus.  Cardiovascular: Normal rate and normal heart sounds.   Pulmonary/Chest: Effort normal and breath sounds normal. No respiratory distress.  Musculoskeletal: Normal range of motion.  Neurological: He is alert and oriented to person, place, and time.  Skin: Skin is intact. No rash  noted.  Psychiatric: He has a normal mood and affect. His speech is normal and behavior is normal. Judgment and thought content normal. Cognition and memory are normal.    Results for orders placed or performed in visit on 04/14/16  Microscopic Examination  Result Value Ref Range   WBC, UA None seen 0 - 5 /hpf   RBC, UA None seen 0 - 2 /hpf   Epithelial Cells (non renal) None seen 0 - 10 /hpf   Bacteria, UA None seen None seen/Few  Comprehensive metabolic panel  Result Value Ref Range   Glucose 128 (H) 65 - 99 mg/dL   BUN 13 8 - 27 mg/dL   Creatinine, Ser 1.11 0.76 - 1.27 mg/dL   GFR calc non Af Amer 69 >59 mL/min/1.73   GFR calc Af Amer 80 >59 mL/min/1.73   BUN/Creatinine Ratio 12 10 - 24   Sodium 143 134 - 144 mmol/L   Potassium 4.1 3.5 - 5.2 mmol/L   Chloride 104 96 - 106 mmol/L   CO2 21 18 - 29 mmol/L   Calcium 9.4 8.6 - 10.2 mg/dL   Total Protein 7.0 6.0 - 8.5 g/dL   Albumin 4.5 3.6 - 4.8 g/dL   Globulin, Total 2.5 1.5 - 4.5 g/dL   Albumin/Globulin Ratio 1.8 1.2 - 2.2   Bilirubin Total 0.5 0.0 - 1.2 mg/dL   Alkaline Phosphatase 53 39 - 117 IU/L  AST 28 0 - 40 IU/L   ALT 46 (H) 0 - 44 IU/L  Lipid panel  Result Value Ref Range   Cholesterol, Total 150 100 - 199 mg/dL   Triglycerides 245 (H) 0 - 149 mg/dL   HDL 33 (L) >39 mg/dL   VLDL Cholesterol Cal 49 (H) 5 - 40 mg/dL   LDL Calculated 68 0 - 99 mg/dL   Chol/HDL Ratio 4.5 0.0 - 5.0 ratio units  CBC with Differential/Platelet  Result Value Ref Range   WBC 6.2 3.4 - 10.8 x10E3/uL   RBC 4.71 4.14 - 5.80 x10E6/uL   Hemoglobin 14.5 13.0 - 17.7 g/dL   Hematocrit 42.4 37.5 - 51.0 %   MCV 90 79 - 97 fL   MCH 30.8 26.6 - 33.0 pg   MCHC 34.2 31.5 - 35.7 g/dL   RDW 13.6 12.3 - 15.4 %   Platelets 205 150 - 379 x10E3/uL   Neutrophils 55 Not Estab. %   Lymphs 34 Not Estab. %   Monocytes 7 Not Estab. %   Eos 3 Not Estab. %   Basos 1 Not Estab. %   Neutrophils Absolute 3.4 1.4 - 7.0 x10E3/uL   Lymphocytes Absolute 2.1 0.7  - 3.1 x10E3/uL   Monocytes Absolute 0.4 0.1 - 0.9 x10E3/uL   EOS (ABSOLUTE) 0.2 0.0 - 0.4 x10E3/uL   Basophils Absolute 0.1 0.0 - 0.2 x10E3/uL   Immature Granulocytes 0 Not Estab. %   Immature Grans (Abs) 0.0 0.0 - 0.1 x10E3/uL  TSH  Result Value Ref Range   TSH 5.440 (H) 0.450 - 4.500 uIU/mL  Urinalysis, Routine w reflex microscopic  Result Value Ref Range   Specific Gravity, UA 1.010 1.005 - 1.030   pH, UA 5.5 5.0 - 7.5   Color, UA Yellow Yellow   Appearance Ur Clear Clear   Leukocytes, UA Negative Negative   Protein, UA Negative Negative/Trace   Glucose, UA 1+ (A) Negative   Ketones, UA Negative Negative   RBC, UA Negative Negative   Bilirubin, UA Negative Negative   Urobilinogen, Ur 0.2 0.2 - 1.0 mg/dL   Nitrite, UA Negative Negative   Microscopic Examination See below:   PSA  Result Value Ref Range   Prostate Specific Ag, Serum 1.2 0.0 - 4.0 ng/mL  Uric acid  Result Value Ref Range   Uric Acid 2.7 (L) 3.7 - 8.6 mg/dL      Assessment & Plan:   Problem List Items Addressed This Visit      Cardiovascular and Mediastinum   Idiopathic cardiomyopathy ([facility])   Relevant Medications   spironolactone (ALDACTONE) 25 MG tablet   SYSTOLIC HEART FAILURE, CHRONIC    Doing well with spironolactone      Relevant Medications   spironolactone (ALDACTONE) 25 MG tablet   Hypertension - Primary    The current medical regimen is effective;  continue present plan and medications.        Relevant Medications   spironolactone (ALDACTONE) 25 MG tablet   Other Relevant Orders   Basic metabolic panel       Follow up plan: Return in about 6 months (around 10/28/2016) for BMP,  Lipids, ALT, AST.

## 2016-04-29 NOTE — Assessment & Plan Note (Addendum)
The current medical regimen is effective;  continue present plan and medications.  

## 2016-04-29 NOTE — Assessment & Plan Note (Signed)
Doing well with spironolactone

## 2016-04-30 ENCOUNTER — Encounter: Payer: Self-pay | Admitting: Family Medicine

## 2016-04-30 LAB — BASIC METABOLIC PANEL
BUN/Creatinine Ratio: 14 (ref 10–24)
BUN: 15 mg/dL (ref 8–27)
CALCIUM: 9.4 mg/dL (ref 8.6–10.2)
CO2: 25 mmol/L (ref 18–29)
Chloride: 102 mmol/L (ref 96–106)
Creatinine, Ser: 1.04 mg/dL (ref 0.76–1.27)
GFR calc Af Amer: 87 mL/min/{1.73_m2} (ref >59)
GFR, EST NON AFRICAN AMERICAN: 75 mL/min/{1.73_m2} (ref >59)
Glucose: 137 mg/dL — ABNORMAL HIGH (ref 65–99)
POTASSIUM: 4.2 mmol/L (ref 3.5–5.2)
Sodium: 143 mmol/L (ref 134–144)

## 2016-06-15 ENCOUNTER — Ambulatory Visit (INDEPENDENT_AMBULATORY_CARE_PROVIDER_SITE_OTHER): Payer: Medicare Other | Admitting: Internal Medicine

## 2016-06-15 ENCOUNTER — Encounter: Payer: Self-pay | Admitting: Internal Medicine

## 2016-06-15 VITALS — BP 140/90 | HR 73 | Ht 72.5 in | Wt 219.2 lb

## 2016-06-15 DIAGNOSIS — I428 Other cardiomyopathies: Secondary | ICD-10-CM | POA: Diagnosis not present

## 2016-06-15 DIAGNOSIS — I5022 Chronic systolic (congestive) heart failure: Secondary | ICD-10-CM | POA: Diagnosis not present

## 2016-06-15 DIAGNOSIS — Z95810 Presence of automatic (implantable) cardiac defibrillator: Secondary | ICD-10-CM | POA: Diagnosis not present

## 2016-06-15 DIAGNOSIS — I10 Essential (primary) hypertension: Secondary | ICD-10-CM | POA: Diagnosis not present

## 2016-06-15 DIAGNOSIS — I442 Atrioventricular block, complete: Secondary | ICD-10-CM | POA: Diagnosis not present

## 2016-06-15 LAB — CUP PACEART INCLINIC DEVICE CHECK
Battery Voltage: 2.63 V
Brady Statistic AP VS Percent: 0.07 %
Brady Statistic RV Percent Paced: 99.89 %
HIGH POWER IMPEDANCE MEASURED VALUE: 47 Ohm
HIGH POWER IMPEDANCE MEASURED VALUE: 532 Ohm
HighPow Impedance: 57 Ohm
Implantable Lead Implant Date: 20060505
Implantable Lead Location: 753859
Implantable Lead Model: 5076
Implantable Lead Model: 6947
Implantable Pulse Generator Implant Date: 20120111
Lead Channel Impedance Value: 4047 Ohm
Lead Channel Impedance Value: 589 Ohm
Lead Channel Pacing Threshold Amplitude: 0.5 V
Lead Channel Pacing Threshold Amplitude: 0.75 V
Lead Channel Pacing Threshold Pulse Width: 0.4 ms
Lead Channel Pacing Threshold Pulse Width: 0.4 ms
Lead Channel Setting Pacing Amplitude: 2 V
Lead Channel Setting Pacing Amplitude: 2.5 V
Lead Channel Setting Sensing Sensitivity: 0.3 mV
MDC IDC LEAD IMPLANT DT: 20120111
MDC IDC LEAD LOCATION: 753860
MDC IDC MSMT LEADCHNL LV IMPEDANCE VALUE: 4047 Ohm
MDC IDC MSMT LEADCHNL LV IMPEDANCE VALUE: 4047 Ohm
MDC IDC MSMT LEADCHNL RA IMPEDANCE VALUE: 399 Ohm
MDC IDC MSMT LEADCHNL RA SENSING INTR AMPL: 2.125 mV
MDC IDC SESS DTM: 20180206113446
MDC IDC SET LEADCHNL RV PACING PULSEWIDTH: 0.4 ms
MDC IDC STAT BRADY AP VP PERCENT: 37.53 %
MDC IDC STAT BRADY AS VP PERCENT: 62.37 %
MDC IDC STAT BRADY AS VS PERCENT: 0.03 %
MDC IDC STAT BRADY RA PERCENT PACED: 37.58 %

## 2016-06-15 MED ORDER — LOSARTAN POTASSIUM 100 MG PO TABS: ORAL_TABLET | ORAL | Status: DC

## 2016-06-15 NOTE — Patient Instructions (Signed)
Medication Instructions: - Your physician has recommended you make the following change in your medication: 1) Losartan 100 mg- take one tablet by mouth at bedtime 2) Trial- Decrease carvedilol to 25 mg- take 1/2 tablet (12.5 mg) by mouth twice daily 3) Trial- Decrease spironolactone 25 mg- take 1/2 tablet (12.5 mg) by mouth once daily  Labwork: - pending procedure date  Procedures/Testing: - Your physician has requested that you have an echocardiogram- ASAP. Echocardiography is a painless test that uses sound waves to create images of your heart. It provides your doctor with information about the size and shape of your heart and how well your heart's chambers and valves are working. This procedure takes approximately one hour. There are no restrictions for this procedure.  - Your physician has recommended that you have a defibrillator generator (battery) change vs device upgrade  Follow-Up: - pending procedure date  Any Additional Special Instructions Will Be Listed Below (If Applicable).     If you need a refill on your cardiac medications before your next appointment, please call your pharmacy.

## 2016-06-15 NOTE — Progress Notes (Signed)
skf      Patient Care Team: Guadalupe Maple, MD as PCP - General (Unknown Physician Specialty)   HPI  Ryan [name] is a 66 y.o. male Seen in followup for an ICD implanted for syncope in the setting of a nonischemic cardiomyopathy. He is now device dependent. He had 6949 lead in place and underwent generator replacement with a new 6947-lead January 2012   The patient denies chest pain,  nocturnal dyspnea, orthopnea or peripheral edema. He has had some exertional dyspnea.   There have been no palpitations, lightheadedness or syncope.   He dates his overall sense of lassitude and shortness of breath to the drug company and dates for drug changes. This included 2 things, he was switched from telmisartan to losartan and from metoprolol succinate to carvedilol. He had previously taken Coreg without difficulty.  He notes that this sense is unassociated with blood pressure changes although it occurs a couple of hours after he takes his medications.   He is wife are now retired from [employer]. They have purchased an RV and hope to see all the states    Echocardiogram 3/14 demonstrated ejection fraction of 40-45% with significant left atrial enlargement  Past Medical History:  Diagnosis Date  . Anxiety   . Complete heart block ([place])   . Depression   . Dual ICD (implantable cardiac defibrillator-Medtronic    [phone] the implanted originally was 6947 implanted change out 2012  . GERD (gastroesophageal reflux disease)   . Gout   . Hypertension   . IBS (irritable bowel syndrome)   . Kidney stones   . Nonischemic cardiomyopathy ([place])   . Paroxysmal atrial fibrillation (H[facility])   . Syncope     Past Surgical History:  Procedure Laterality Date  . CARDIAC DEFIBRILLATOR PLACEMENT    . LITHOTRIPSY    . UPPER ENDOSCOPY W/ ESOPHAGEAL MANOMETRY      Current Outpatient Prescriptions  Medication Sig Dispense Refill  . aspirin 81 MG EC tablet Take 81 mg by mouth daily.      . carvedilol  (COREG) 25 MG tablet Take 1 tablet (25 mg total) by mouth 2 (two) times daily. 180 tablet 4  . dexlansoprazole (DEXILANT) 60 MG capsule Take 1 capsule (60 mg total) by mouth daily. 90 capsule 4  . febuxostat (ULORIC) 40 MG tablet Take 1 tablet (40 mg total) by mouth daily. 90 tablet 4  . glipiZIDE (GLUCOTROL) 5 MG tablet Take 1 tablet (5 mg total) by mouth daily before breakfast. 90 tablet 4  . losartan (COZAAR) 100 MG tablet Take 1 tablet (100 mg total) by mouth daily. 90 tablet 4  . ranitidine (ZANTAC) 300 MG tablet Take 1 tablet (300 mg total) by mouth daily. 90 tablet 4  . spironolactone (ALDACTONE) 25 MG tablet Take 1 tablet (25 mg total) by mouth daily. 90 tablet 2   No current facility-administered medications for this visit.     Allergies  Allergen Reactions  . Ace Inhibitors   . Crestor [Rosuvastatin Calcium] Other (See Comments)    Muscle aches    Review of Systems negative except from HPI and PMH  Physical Exam BP 140/90 (BP Location: Left Arm, Patient Position: Sitting, Cuff Size: Normal)   Pulse 73   Ht 6' 0.5" (1.842 m)   Wt 219 lb 4 oz (99.5 kg)   BMI 29.33 kg/m  Well developed and nourished in no acute distress HENT normal Neck supple with JVP-flat Clear Regular rate and rhythm, no  murmurs or gallops Abd-soft with active BS No Clubbing cyanosis edema Skin-warm and dry A & Oriented  Grossly normal sensory and motor function Device pocket well healed; without hematoma or erythema.  There is no tethering   ECG demonstrates P synchronous pacing with CHB  Assessment and  Plan  Nonischemic cardiomyopathy continue current medications. Class 1-2 symptoms no indication for Aldactone   Complete heart block stable post pacing  Paroxysmal atrial fibrillation no intercurrent atrial fibrillation on aspirin  Status post ICD-Medtronic LV port plugged The patient's device was interrogated.  The information was reviewed. No changes were made in the programming.     Chronic systolic heart failure  Class IIb  Statin intolerance  Hypertension  Lightheadedness  Euvolemic continue current meds  He has had significant interval problems with blood pressure. It is now well controlled on the current regime; however, there is some part of it that appears to be giving rise to dizziness. This does not seem to be related to low blood pressure.  We will have him take his losartan at night and then undertake t undertake a half dose reduction trial of both his carvedilol and his Aldactone to see if we can identify the culprit drug.  He is approaching ERI. We will undertake an echocardiogram and we have discussed CRT upgrade given his symptoms of heart failure.  Despite the fact that this would be a third invasive component, superficial collateralization is not evident. We would need a preprocedural venogram.   More than 50% of 45 min was spent in counseling related to the above

## 2016-06-22 ENCOUNTER — Other Ambulatory Visit: Payer: Self-pay

## 2016-06-22 ENCOUNTER — Ambulatory Visit (INDEPENDENT_AMBULATORY_CARE_PROVIDER_SITE_OTHER): Payer: Medicare Other

## 2016-06-22 DIAGNOSIS — I428 Other cardiomyopathies: Secondary | ICD-10-CM

## 2016-06-29 ENCOUNTER — Telehealth: Payer: Self-pay | Admitting: Internal Medicine

## 2016-06-29 DIAGNOSIS — I428 Other cardiomyopathies: Secondary | ICD-10-CM

## 2016-06-29 DIAGNOSIS — Z01812 Encounter for preprocedural laboratory examination: Secondary | ICD-10-CM

## 2016-06-29 DIAGNOSIS — I5022 Chronic systolic (congestive) heart failure: Secondary | ICD-10-CM

## 2016-06-29 NOTE — Telephone Encounter (Signed)
I called and spoke with the patient.  He is aware of his echo results and Dr. Olin Pia recommendations to upgrade his device. He is scheduled for device upgrade on 07/21/16 with Dr. Caryl Comes at 12:00 pm. He is aware that I will mail his detailed letter of instructions. He will have labs done on 07/13/16 & pick up his surgical scrub at that time.

## 2016-07-02 ENCOUNTER — Encounter: Payer: Self-pay | Admitting: *Deleted

## 2016-07-05 ENCOUNTER — Telehealth: Payer: Self-pay | Admitting: Cardiology

## 2016-07-05 ENCOUNTER — Ambulatory Visit (INDEPENDENT_AMBULATORY_CARE_PROVIDER_SITE_OTHER): Payer: Medicare Other | Admitting: *Deleted

## 2016-07-05 DIAGNOSIS — Z95810 Presence of automatic (implantable) cardiac defibrillator: Secondary | ICD-10-CM

## 2016-07-05 NOTE — Progress Notes (Signed)
Remote ICD transmission.   

## 2016-07-05 NOTE — Telephone Encounter (Signed)
Spoke with pt and reminded pt of remote transmission that is due today. Pt verbalized understanding.   

## 2016-07-06 ENCOUNTER — Encounter: Payer: Self-pay | Admitting: Cardiology

## 2016-07-07 LAB — CUP PACEART REMOTE DEVICE CHECK
Battery Voltage: 2.63 V
Brady Statistic AP VS Percent: 0 %
Brady Statistic AS VS Percent: 0 %
Brady Statistic RV Percent Paced: 100 %
HIGH POWER IMPEDANCE MEASURED VALUE: 50 Ohm
HIGH POWER IMPEDANCE MEASURED VALUE: 570 Ohm
HighPow Impedance: 66 Ohm
Implantable Lead Implant Date: 20120111
Implantable Lead Location: 753859
Implantable Pulse Generator Implant Date: 20120111
Lead Channel Impedance Value: 4047 Ohm
Lead Channel Impedance Value: 4047 Ohm
Lead Channel Impedance Value: 4047 Ohm
Lead Channel Impedance Value: 646 Ohm
Lead Channel Pacing Threshold Amplitude: 0.625 V
Lead Channel Pacing Threshold Pulse Width: 0.4 ms
Lead Channel Sensing Intrinsic Amplitude: 12.625 mV
Lead Channel Sensing Intrinsic Amplitude: 12.625 mV
Lead Channel Sensing Intrinsic Amplitude: 2 mV
Lead Channel Setting Pacing Amplitude: 2 V
Lead Channel Setting Pacing Amplitude: 2.5 V
Lead Channel Setting Sensing Sensitivity: 0.3 mV
MDC IDC LEAD IMPLANT DT: 20060505
MDC IDC LEAD LOCATION: 753860
MDC IDC MSMT LEADCHNL RA IMPEDANCE VALUE: 399 Ohm
MDC IDC MSMT LEADCHNL RA PACING THRESHOLD PULSEWIDTH: 0.4 ms
MDC IDC MSMT LEADCHNL RA SENSING INTR AMPL: 2 mV
MDC IDC MSMT LEADCHNL RV PACING THRESHOLD AMPLITUDE: 0.5 V
MDC IDC SESS DTM: 20180226185825
MDC IDC SET LEADCHNL RV PACING PULSEWIDTH: 0.4 ms
MDC IDC STAT BRADY AP VP PERCENT: 31.96 %
MDC IDC STAT BRADY AS VP PERCENT: 68.04 %
MDC IDC STAT BRADY RA PERCENT PACED: 31.95 %

## 2016-07-13 ENCOUNTER — Other Ambulatory Visit (INDEPENDENT_AMBULATORY_CARE_PROVIDER_SITE_OTHER): Payer: Medicare Other

## 2016-07-13 DIAGNOSIS — Z01812 Encounter for preprocedural laboratory examination: Secondary | ICD-10-CM

## 2016-07-13 DIAGNOSIS — I428 Other cardiomyopathies: Secondary | ICD-10-CM | POA: Diagnosis not present

## 2016-07-13 DIAGNOSIS — I5022 Chronic systolic (congestive) heart failure: Secondary | ICD-10-CM

## 2016-07-14 LAB — BASIC METABOLIC PANEL
BUN/Creatinine Ratio: 12 (ref 10–24)
BUN: 16 mg/dL (ref 8–27)
CHLORIDE: 98 mmol/L (ref 96–106)
CO2: 23 mmol/L (ref 18–29)
Calcium: 9.7 mg/dL (ref 8.6–10.2)
Creatinine, Ser: 1.31 mg/dL — ABNORMAL HIGH (ref 0.76–1.27)
GFR calc Af Amer: 66 mL/min/{1.73_m2} (ref >59)
GFR calc non Af Amer: 57 mL/min/{1.73_m2} — ABNORMAL LOW (ref >59)
Glucose: 263 mg/dL — ABNORMAL HIGH (ref 65–99)
POTASSIUM: 4.3 mmol/L (ref 3.5–5.2)
Sodium: 136 mmol/L (ref 134–144)

## 2016-07-14 LAB — CBC WITH DIFFERENTIAL/PLATELET
BASOS ABS: 0.1 10*3/uL (ref 0.0–0.2)
Basos: 1 %
EOS (ABSOLUTE): 0.2 10*3/uL (ref 0.0–0.4)
Eos: 3 %
Hematocrit: 40.4 % (ref 37.5–51.0)
Hemoglobin: 14.3 g/dL (ref 13.0–17.7)
IMMATURE GRANS (ABS): 0 10*3/uL (ref 0.0–0.1)
IMMATURE GRANULOCYTES: 0 %
LYMPHS: 34 %
Lymphocytes Absolute: 2.1 10*3/uL (ref 0.7–3.1)
MCH: 31.4 pg (ref 26.6–33.0)
MCHC: 35.4 g/dL (ref 31.5–35.7)
MCV: 89 fL (ref 79–97)
Monocytes Absolute: 0.6 10*3/uL (ref 0.1–0.9)
Monocytes: 9 %
NEUTROS PCT: 53 %
Neutrophils Absolute: 3.2 10*3/uL (ref 1.4–7.0)
PLATELETS: 189 10*3/uL (ref 150–379)
RBC: 4.56 x10E6/uL (ref 4.14–5.80)
RDW: 13.6 % (ref 12.3–15.4)
WBC: 6 10*3/uL (ref 3.4–10.8)

## 2016-07-14 LAB — PROTIME-INR
INR: 1 (ref 0.8–1.2)
Prothrombin Time: 10.8 s (ref 9.1–12.0)

## 2016-07-21 ENCOUNTER — Ambulatory Visit (HOSPITAL_COMMUNITY)
Admission: RE | Admit: 2016-07-21 | Discharge: 2016-07-22 | Disposition: A | Discharge status: Home / Self Care | Payer: Medicare Other | Source: Ambulatory Visit | Attending: Internal Medicine | Admitting: Internal Medicine

## 2016-07-21 ENCOUNTER — Encounter (HOSPITAL_COMMUNITY): Payer: Self-pay

## 2016-07-21 ENCOUNTER — Encounter (HOSPITAL_COMMUNITY)
Admission: RE | Disposition: A | Discharge status: Home / Self Care | Payer: Self-pay | Source: Ambulatory Visit | Attending: Internal Medicine

## 2016-07-21 DIAGNOSIS — K219 Gastro-esophageal reflux disease without esophagitis: Secondary | ICD-10-CM | POA: Insufficient documentation

## 2016-07-21 DIAGNOSIS — Z01818 Encounter for other preprocedural examination: Secondary | ICD-10-CM | POA: Diagnosis not present

## 2016-07-21 DIAGNOSIS — I110 Hypertensive heart disease with heart failure: Secondary | ICD-10-CM | POA: Diagnosis not present

## 2016-07-21 DIAGNOSIS — Z8249 Family history of ischemic heart disease and other diseases of the circulatory system: Secondary | ICD-10-CM | POA: Diagnosis not present

## 2016-07-21 DIAGNOSIS — M109 Gout, unspecified: Secondary | ICD-10-CM | POA: Diagnosis not present

## 2016-07-21 DIAGNOSIS — I5022 Chronic systolic (congestive) heart failure: Secondary | ICD-10-CM | POA: Diagnosis not present

## 2016-07-21 DIAGNOSIS — K589 Irritable bowel syndrome without diarrhea: Secondary | ICD-10-CM | POA: Insufficient documentation

## 2016-07-21 DIAGNOSIS — Z7984 Long term (current) use of oral hypoglycemic drugs: Secondary | ICD-10-CM | POA: Diagnosis not present

## 2016-07-21 DIAGNOSIS — I442 Atrioventricular block, complete: Secondary | ICD-10-CM | POA: Diagnosis present

## 2016-07-21 DIAGNOSIS — Z833 Family history of diabetes mellitus: Secondary | ICD-10-CM | POA: Insufficient documentation

## 2016-07-21 DIAGNOSIS — Z4502 Encounter for adjustment and management of automatic implantable cardiac defibrillator: Secondary | ICD-10-CM | POA: Insufficient documentation

## 2016-07-21 DIAGNOSIS — Z7982 Long term (current) use of aspirin: Secondary | ICD-10-CM | POA: Diagnosis not present

## 2016-07-21 DIAGNOSIS — I5020 Unspecified systolic (congestive) heart failure: Secondary | ICD-10-CM

## 2016-07-21 DIAGNOSIS — Z87891 Personal history of nicotine dependence: Secondary | ICD-10-CM | POA: Insufficient documentation

## 2016-07-21 DIAGNOSIS — I429 Cardiomyopathy, unspecified: Secondary | ICD-10-CM

## 2016-07-21 DIAGNOSIS — Z959 Presence of cardiac and vascular implant and graft, unspecified: Secondary | ICD-10-CM

## 2016-07-21 DIAGNOSIS — I509 Heart failure, unspecified: Secondary | ICD-10-CM | POA: Diagnosis present

## 2016-07-21 DIAGNOSIS — F329 Major depressive disorder, single episode, unspecified: Secondary | ICD-10-CM | POA: Insufficient documentation

## 2016-07-21 DIAGNOSIS — F419 Anxiety disorder, unspecified: Secondary | ICD-10-CM | POA: Insufficient documentation

## 2016-07-21 DIAGNOSIS — I480 Paroxysmal atrial fibrillation: Secondary | ICD-10-CM | POA: Insufficient documentation

## 2016-07-21 DIAGNOSIS — I428 Other cardiomyopathies: Secondary | ICD-10-CM | POA: Insufficient documentation

## 2016-07-21 DIAGNOSIS — Z95810 Presence of automatic (implantable) cardiac defibrillator: Secondary | ICD-10-CM | POA: Diagnosis present

## 2016-07-21 HISTORY — PX: BIV UPGRADE: EP1202

## 2016-07-21 LAB — GLUCOSE, CAPILLARY
GLUCOSE-CAPILLARY: 205 mg/dL — AB (ref 65–99)
Glucose-Capillary: 239 mg/dL — ABNORMAL HIGH (ref 65–99)

## 2016-07-21 LAB — SURGICAL PCR SCREEN
MRSA, PCR: NEGATIVE
Staphylococcus aureus: NEGATIVE

## 2016-07-21 SURGERY — BIV UPGRADE
Anesthesia: LOCAL

## 2016-07-21 MED ORDER — FENTANYL CITRATE (PF) 100 MCG/2ML IJ SOLN
INTRAMUSCULAR | Status: AC
  Filled 2016-07-21: qty 2

## 2016-07-21 MED ORDER — SODIUM CHLORIDE 0.9 % IR SOLN
80.0000 mg | Status: AC
  Administered 2016-07-21: 80 mg

## 2016-07-21 MED ORDER — MUPIROCIN 2 % EX OINT: 1.0000 "application " | TOPICAL_OINTMENT | Freq: Once | CUTANEOUS | Status: DC

## 2016-07-21 MED ORDER — MIDAZOLAM HCL 5 MG/5ML IJ SOLN
INTRAMUSCULAR | Status: DC | PRN
  Administered 2016-07-21 (×7): 1 mg via INTRAVENOUS
  Administered 2016-07-21: 2 mg via INTRAVENOUS

## 2016-07-21 MED ORDER — LIDOCAINE HCL (PF) 1 % IJ SOLN
INTRAMUSCULAR | Status: DC | PRN
  Administered 2016-07-21: 21 mL

## 2016-07-21 MED ORDER — MUPIROCIN 2 % EX OINT
TOPICAL_OINTMENT | CUTANEOUS | Status: AC
  Administered 2016-07-21: 1
  Filled 2016-07-21: qty 22

## 2016-07-21 MED ORDER — HEPARIN (PORCINE) IN NACL 2-0.9 UNIT/ML-% IJ SOLN
INTRAMUSCULAR | Status: DC | PRN
  Administered 2016-07-21: 1000 mL

## 2016-07-21 MED ORDER — IOPAMIDOL (ISOVUE-370) INJECTION 76%
INTRAVENOUS | Status: AC
  Filled 2016-07-21: qty 50

## 2016-07-21 MED ORDER — SODIUM CHLORIDE 0.9 % IR SOLN
Status: AC
  Filled 2016-07-21: qty 2

## 2016-07-21 MED ORDER — CEFAZOLIN IN D5W 1 GM/50ML IV SOLN
1.0000 g | Freq: Four times a day (QID) | INTRAVENOUS | Status: AC
  Administered 2016-07-21 – 2016-07-22 (×3): 1 g via INTRAVENOUS
  Filled 2016-07-21 (×3): qty 50

## 2016-07-21 MED ORDER — SODIUM CHLORIDE 0.9 % IV SOLN
INTRAVENOUS | Status: DC
  Administered 2016-07-21: 11:00:00 via INTRAVENOUS

## 2016-07-21 MED ORDER — CHLORHEXIDINE GLUCONATE 4 % EX LIQD: 60.0000 mL | Freq: Once | CUTANEOUS | Status: DC

## 2016-07-21 MED ORDER — ONDANSETRON HCL 4 MG/2ML IJ SOLN: 4.0000 mg | Freq: Four times a day (QID) | INTRAMUSCULAR | Status: DC | PRN

## 2016-07-21 MED ORDER — CEFAZOLIN SODIUM-DEXTROSE 2-4 GM/100ML-% IV SOLN
2.0000 g | INTRAVENOUS | Status: AC
  Administered 2016-07-21: 2 g via INTRAVENOUS

## 2016-07-21 MED ORDER — ACETAMINOPHEN 325 MG PO TABS
325.0000 mg | ORAL_TABLET | ORAL | Status: DC | PRN
  Administered 2016-07-21 – 2016-07-22 (×3): 650 mg via ORAL
  Filled 2016-07-21 (×3): qty 2

## 2016-07-21 MED ORDER — IOPAMIDOL (ISOVUE-370) INJECTION 76%
INTRAVENOUS | Status: DC | PRN
  Administered 2016-07-21: 10 mL via INTRAVENOUS
  Administered 2016-07-21: 20 mL via INTRAVENOUS
  Administered 2016-07-21: 15 mL via INTRAVENOUS

## 2016-07-21 MED ORDER — SODIUM CHLORIDE 0.9 % IV SOLN: INTRAVENOUS | Status: AC

## 2016-07-21 MED ORDER — CEFAZOLIN SODIUM-DEXTROSE 2-4 GM/100ML-% IV SOLN
INTRAVENOUS | Status: AC
  Filled 2016-07-21: qty 100

## 2016-07-21 MED ORDER — MIDAZOLAM HCL 5 MG/5ML IJ SOLN
INTRAMUSCULAR | Status: AC
  Filled 2016-07-21: qty 5

## 2016-07-21 MED ORDER — FENTANYL CITRATE (PF) 100 MCG/2ML IJ SOLN
INTRAMUSCULAR | Status: DC | PRN
  Administered 2016-07-21: 25 ug via INTRAVENOUS
  Administered 2016-07-21: 12.5 ug via INTRAVENOUS
  Administered 2016-07-21: 25 ug via INTRAVENOUS
  Administered 2016-07-21: 50 ug via INTRAVENOUS
  Administered 2016-07-21 (×3): 12.5 ug via INTRAVENOUS

## 2016-07-21 MED ORDER — LIDOCAINE HCL (PF) 1 % IJ SOLN
INTRAMUSCULAR | Status: AC
  Filled 2016-07-21: qty 60

## 2016-07-21 SURGICAL SUPPLY — 17 items
BALLN ATTAIN 80 (BALLOONS) ×2
BALLOON ATTAIN 80 (BALLOONS) IMPLANT
CABLE SURGICAL S-101-97-12 (CABLE) ×1 IMPLANT
CATH CPS DIRECT 135 DS2C020 (CATHETERS) ×1 IMPLANT
HEMOSTAT SURGICEL 2X4 FIBR (HEMOSTASIS) ×1 IMPLANT
ICD CLARIA MRI DTMA1Q1 (ICD Generator) ×1 IMPLANT
KIT ESSENTIALS PG (KITS) ×1 IMPLANT
LEAD QUARTET 1458Q-86CM (Lead) ×1 IMPLANT
PAD DEFIB LIFELINK (PAD) ×1 IMPLANT
POUCH AIGIS-R ANTIBACT ICD (Mesh General) ×2 IMPLANT
POUCH AIGIS-R ANTIBACT ICD LRG (Mesh General) IMPLANT
SHEATH CLASSIC 9.5F (SHEATH) ×1 IMPLANT
SHIELD RADPAD SCOOP 12X17 (MISCELLANEOUS) ×1 IMPLANT
SLITTER UNIVERSAL DS2A003 (MISCELLANEOUS) ×1 IMPLANT
TRAY PACEMAKER INSERTION (PACKS) ×1 IMPLANT
WIRE ACUITY WHISPER EDS 4648 (WIRE) ×1 IMPLANT
WIRE HI TORQ VERSACORE-J 145CM (WIRE) ×1 IMPLANT

## 2016-07-21 NOTE — Progress Notes (Signed)
Admitted from EP lab s/p BiV ICD placement. Patient stable on arrival, assessed, vital signs WNL, oriented to unit.

## 2016-07-21 NOTE — Discharge Instructions (Signed)
° ° °  Supplemental Discharge Instructions for  Pacemaker/Defibrillator Patients  Activity No heavy lifting or vigorous activity with your left/right arm for 6 to 8 weeks.  Do not raise your left/right arm above your head for one week.  Gradually raise your affected arm as drawn below.             07/25/16                     07/26/16                    07/27/16                  07/28/16 __  NO DRIVING for 1 week    ; you may begin driving on   7/51/70  .  WOUND CARE - Keep the wound area clean and dry.  Do not get this area wet, no showers for 24 hours; you may shower on 07/22/16 evening  . - The tape/steri-strips on your wound will fall off; do not pull them off.  No bandage is needed on the site.  DO  NOT apply any creams, oils, or ointments to the wound area. - If you notice any drainage or discharge from the wound, any swelling or bruising at the site, or you develop a fever > 101? F after you are discharged home, call the office at once.  Special Instructions - You are still able to use cellular telephones; use the ear opposite the side where you have your pacemaker/defibrillator.  Avoid carrying your cellular phone near your device. - When traveling through airports, show security personnel your identification card to avoid being screened in the metal detectors.  Ask the security personnel to use the hand wand. - Avoid arc welding equipment, MRI testing (magnetic resonance imaging), TENS units (transcutaneous nerve stimulators).  Call the office for questions about other devices. - Avoid electrical appliances that are in poor condition or are not properly grounded. - Microwave ovens are safe to be near or to operate.  Additional information for defibrillator patients should your device go off: - If your device goes off ONCE and you feel fine afterward, notify the device clinic nurses. - If your device goes off ONCE and you do not feel well afterward, call 911. - If your device goes off  TWICE, call 911. - If your device goes off THREE times in one day, call 911.  DO NOT DRIVE YOURSELF OR A FAMILY MEMBER WITH A DEFIBRILLATOR TO THE HOSPITAL--CALL 911.

## 2016-07-21 NOTE — H&P (Signed)
Patient Care Team: Guadalupe Maple, MD as PCP - General (Unknown Physician Specialty)   HPI  Ryan [name] is a 66 y.o. male Admitted for a dual chamber ICD generator replacement, initially  implanted for syncope in the setting of a nonischemic cardiomyopathy. He is now device dependent. He had 6949 lead in place and underwent generator replacement with a new 6947-lead January 2012   The patient denies chest pain,  nocturnal dyspnea, orthopnea or peripheral edema. there have been no palpitations, lightheadedness or syncope.  This is without change    He has had some progressive exercise tolerance   Interval echo has shown modest decrease in EF 40-45>>>35%    He is wife are now retired from [employer]. They have purchased an RV and hope to see all the states  He has DM and HTN and Hx of PAF?        Past Medical History:  Diagnosis Date  . Anxiety   . Complete heart block ([place])   . Depression   . Dual ICD (implantable cardiac defibrillator-Medtronic    [phone] the implanted originally was 6947 implanted change out 2012  . GERD (gastroesophageal reflux disease)   . Gout   . Hypertension   . IBS (irritable bowel syndrome)   . Kidney stones   . Nonischemic cardiomyopathy ([place])   . Paroxysmal atrial fibrillation ([facility])   . Syncope     Past Surgical History:  Procedure Laterality Date  . CARDIAC DEFIBRILLATOR PLACEMENT    . LITHOTRIPSY    . UPPER ENDOSCOPY W/ ESOPHAGEAL MANOMETRY      Current Facility-Administered Medications  Medication Dose Route Frequency Provider Last Rate Last Dose  . 0.9 %  sodium chloride infusion   Intravenous Continuous Amber Sena Slate, NP      . 0.9 %  sodium chloride infusion   Intravenous Continuous Amber Sena Slate, NP      . ceFAZolin (ANCEF) IVPB 2g/100 mL premix  2 g Intravenous On Call Amber Sena Slate, NP      . chlorhexidine (HIBICLENS) 4 % liquid 4 application  60 mL Topical Once Amber Sena Slate, NP      . gentamicin  (GARAMYCIN) 80 mg in sodium chloride irrigation 0.9 % 500 mL irrigation  80 mg Irrigation On Call Patsey Berthold, NP        Allergies  Allergen Reactions  . Ace Inhibitors     Muscle aches   . Crestor [Rosuvastatin Calcium] Other (See Comments)    Muscle aches      Social History  Substance Use Topics  . Smoking status: Former Smoker    Types: Cigarettes    Quit date: 12/30/1989  . Smokeless tobacco: Never Used  . Alcohol use 4.2 oz/week    7 Cans of beer per week     Family History  Problem Relation Age of Onset  . Breast cancer Mother   . Diabetes Mother   . Heart disease Mother   . Cancer Mother     breast  . Lung cancer Father   . Diabetes Father   . Cancer Father     lung  . Diabetes Sister      No current facility-administered medications on file prior to encounter.    Current Outpatient Prescriptions on File Prior to Encounter  Medication Sig Dispense Refill  . aspirin 81 MG EC tablet Take 81 mg by mouth daily.      . carvedilol (COREG)  25 MG tablet Take 1 tablet (25 mg total) by mouth 2 (two) times daily. (Patient taking differently: Take 12.5 mg by mouth 2 (two) times daily. ) 180 tablet 4  . dexlansoprazole (DEXILANT) 60 MG capsule Take 1 capsule (60 mg total) by mouth daily. (Patient taking differently: Take 60 mg by mouth every evening. ) 90 capsule 4  . febuxostat (ULORIC) 40 MG tablet Take 1 tablet (40 mg total) by mouth daily. 90 tablet 4  . glipiZIDE (GLUCOTROL) 5 MG tablet Take 1 tablet (5 mg total) by mouth daily before breakfast. 90 tablet 4  . losartan (COZAAR) 100 MG tablet Take one tablet (100 mg) by mouth once daily at bedtime (Patient taking differently: Take 100 mg by mouth at bedtime. )    . ranitidine (ZANTAC) 300 MG tablet Take 1 tablet (300 mg total) by mouth daily. 90 tablet 4  . spironolactone (ALDACTONE) 25 MG tablet Take 1 tablet (25 mg total) by mouth daily. 90 tablet 2      Review of Systems negative except from HPI and  PMH  Physical Exam BP (!) 142/91   Pulse 70   Temp 98 F (36.7 C) (Oral)   Resp 18   Ht 6\' 1"  (1.854 m)   Wt 214 lb (97.1 kg)   SpO2 99%   BMI 28.23 kg/m  Well developed and well nourished in no acute distress HENT normal E scleral and icterus clear Neck Supple JVP flat; carotids brisk and full Clear to ausculation Device pocket well healed; without hematoma or erythema.  There is no tethering without collaterals Regular rate and rhythm, no murmurs gallops or rub Soft with active bowel sounds No clubbing cyanosis  Edema Alert and oriented, grossly normal motor and sensory function Skin Warm and Dry  ECG personally reviewed 2/18  P-synchronous/ AV  pacing with QRS 212 msec  Assessment and  Plan  Ischemic Cardiomyopathy  CHF class 3  Complete heart block  6949 lead-abandoned  Dual chamber ICD  AFib by hx   For  Generator replacement with upgrade to CRT 2/2 CHF and deteriorating LV function  Have reviewed the potential benefits and risks of CRT upgrade including but not limited to death, perforation of heart or lung, lead dislodgement, infection,  device malfunction and inappropriate shocks.  The patient and family express understanding  and are willing to proceed.

## 2016-07-22 ENCOUNTER — Ambulatory Visit (HOSPITAL_COMMUNITY): Payer: Medicare Other

## 2016-07-22 ENCOUNTER — Encounter (HOSPITAL_COMMUNITY): Payer: Self-pay | Admitting: Internal Medicine

## 2016-07-22 DIAGNOSIS — F329 Major depressive disorder, single episode, unspecified: Secondary | ICD-10-CM | POA: Diagnosis not present

## 2016-07-22 DIAGNOSIS — R0789 Other chest pain: Secondary | ICD-10-CM | POA: Diagnosis not present

## 2016-07-22 DIAGNOSIS — I480 Paroxysmal atrial fibrillation: Secondary | ICD-10-CM | POA: Diagnosis not present

## 2016-07-22 DIAGNOSIS — K219 Gastro-esophageal reflux disease without esophagitis: Secondary | ICD-10-CM | POA: Diagnosis not present

## 2016-07-22 DIAGNOSIS — I110 Hypertensive heart disease with heart failure: Secondary | ICD-10-CM | POA: Diagnosis not present

## 2016-07-22 DIAGNOSIS — K589 Irritable bowel syndrome without diarrhea: Secondary | ICD-10-CM | POA: Diagnosis not present

## 2016-07-22 DIAGNOSIS — Z4502 Encounter for adjustment and management of automatic implantable cardiac defibrillator: Secondary | ICD-10-CM | POA: Diagnosis not present

## 2016-07-22 DIAGNOSIS — I428 Other cardiomyopathies: Secondary | ICD-10-CM | POA: Diagnosis not present

## 2016-07-22 DIAGNOSIS — I442 Atrioventricular block, complete: Secondary | ICD-10-CM | POA: Diagnosis not present

## 2016-07-22 DIAGNOSIS — M109 Gout, unspecified: Secondary | ICD-10-CM | POA: Diagnosis not present

## 2016-07-22 DIAGNOSIS — I5022 Chronic systolic (congestive) heart failure: Secondary | ICD-10-CM | POA: Diagnosis not present

## 2016-07-22 DIAGNOSIS — Z87891 Personal history of nicotine dependence: Secondary | ICD-10-CM | POA: Diagnosis not present

## 2016-07-22 DIAGNOSIS — F419 Anxiety disorder, unspecified: Secondary | ICD-10-CM | POA: Diagnosis not present

## 2016-07-22 LAB — GLUCOSE, CAPILLARY: Glucose-Capillary: 230 mg/dL — ABNORMAL HIGH (ref 65–99)

## 2016-07-22 NOTE — Discharge Summary (Signed)
ELECTROPHYSIOLOGY PROCEDURE DISCHARGE SUMMARY    Patient ID: Ryan [name],  [name]: [id number], DOB/AGE: [birth year] 66 y.o.  Admit date: 07/21/2016 Discharge date: 07/22/2016  Primary Care Physician: Golden Pop, MD Electrophysiologist: Caryl Comes  Primary Discharge Diagnosis:  CHF s/p CRTD upgrade this admission  Secondary Discharge Diagnosis:  1.  NICM 2.  HTN 3.  Paroxysmal atrial fibrillation 4.  CHB  Allergies  Allergen Reactions  . Ace Inhibitors     Muscle aches   . Crestor [Rosuvastatin Calcium] Other (See Comments)    Muscle aches     Procedures This Admission:  1.  Upgrade to MDT CRTD on 07/21/16 by Dr Caryl Comes. See op note for full details.  There were no immediate post procedure complications. 2.  CXR on 07/22/16 demonstrated no pneumothorax status post device implantation.   Brief HPI: Ryan [name] is a 66 y.o. male with a past medical history as outlined above. With chronic RV pacing, he has had worsening HF symptoms.  Risks, benefits, and alternatives to CRT upgrade were reviewed with the patient who wished to proceed.   Hospital Course:  The patient was admitted and underwent upgrade to a MDT CRTD with details as outlined above. He was monitored on telemetry overnight which demonstrated sinus rhythm with V pacing.  Left chest was without hematoma or ecchymosis.  The device was interrogated and found to be functioning normally.  CXR was obtained and demonstrated no pneumothorax status post device implantation.  Wound care, arm mobility, and restrictions were reviewed with the patient.  The patient was examined and considered stable for discharge to home.   The patient's discharge medications include an ARB (Losartan) and beta blocker (Coreg).   Physical Exam: Vitals:   07/21/16 1730 07/21/16 1800 07/21/16 2203 07/22/16 0625  BP: 129/80 128/86 (!) 143/80 (!) 149/84  Pulse: 78 71 71 78  Resp:   18   Temp:   97.7 F (36.5 C) 97.8 F (36.6 C)  TempSrc:   Oral  Oral  SpO2:   97% 98%  Weight:      Height:        GEN- The patient is well appearing, alert and oriented x 3 today.   HEENT: normocephalic, atraumatic; sclera clear, conjunctiva pink; hearing intact; oropharynx clear; neck supple  Lungs- Clear to ausculation bilaterally, normal work of breathing.  No wheezes, rales, rhonchi Heart- Regular rate and rhythm (paced) GI- soft, non-tender, non-distended, bowel sounds present  Extremities- no clubbing, cyanosis, or edema  MS- no significant deformity or atrophy Skin- warm and dry, no rash or lesion, left chest without hematoma/ecchymosis Psych- euthymic mood, full affect Neuro- strength and sensation are intact   Labs:   Lab Results  Component Value Date   WBC 6.0 07/13/2016   HGB 14.6 05/14/2010   HCT 40.4 07/13/2016   MCV 89 07/13/2016   PLT 189 07/13/2016   No results for input(s): NA, K, CL, CO2, BUN, CREATININE, CALCIUM, PROT, BILITOT, ALKPHOS, ALT, AST, GLUCOSE in the last 168 hours.  Invalid input(s): LABALBU  Discharge Medications:  Allergies as of 07/22/2016      Reactions   Ace Inhibitors    Muscle aches    Crestor [rosuvastatin Calcium] Other (See Comments)   Muscle aches      Medication List    TAKE these medications   aspirin 81 MG EC tablet Take 81 mg by mouth daily.   carvedilol 25 MG tablet Commonly known as:  COREG Take 1 tablet (25  mg total) by mouth 2 (two) times daily. What changed:  how much to take   dexlansoprazole 60 MG capsule Commonly known as:  DEXILANT Take 1 capsule (60 mg total) by mouth daily. What changed:  when to take this   febuxostat 40 MG tablet Commonly known as:  ULORIC Take 1 tablet (40 mg total) by mouth daily.   glipiZIDE 5 MG tablet Commonly known as:  GLUCOTROL Take 1 tablet (5 mg total) by mouth daily before breakfast.   losartan 100 MG tablet Commonly known as:  COZAAR Take one tablet (100 mg) by mouth once daily at bedtime What changed:  how much to  take  how to take this  when to take this  additional instructions   ranitidine 300 MG tablet Commonly known as:  ZANTAC Take 1 tablet (300 mg total) by mouth daily.   spironolactone 25 MG tablet Commonly known as:  ALDACTONE Take 1 tablet (25 mg total) by mouth daily.       Disposition:  Discharge Instructions    Diet - low sodium heart healthy    Complete by:  As directed    Increase activity slowly    Complete by:  As directed      Follow-up Information    [facility] Follow up on 08/04/2016.   Specialty:  Cardiology Why:  10:00AM, wound check Contact information: [address]       Virl Axe, MD Follow up on 11/04/2016.   Specialty:  Cardiology Why:  3:00PM Contact information: [address] [place] [address] [phone]           Duration of Discharge Encounter: Greater than 30 minutes including physician time.  Signed, Chanetta Marshall, NP 07/22/2016 7:55 AM

## 2016-08-04 ENCOUNTER — Ambulatory Visit (INDEPENDENT_AMBULATORY_CARE_PROVIDER_SITE_OTHER): Payer: Medicare Other | Admitting: *Deleted

## 2016-08-04 DIAGNOSIS — I428 Other cardiomyopathies: Secondary | ICD-10-CM

## 2016-08-04 DIAGNOSIS — I442 Atrioventricular block, complete: Secondary | ICD-10-CM

## 2016-08-04 LAB — CUP PACEART INCLINIC DEVICE CHECK
Battery Remaining Longevity: 106 mo
Brady Statistic AP VP Percent: 3.12 %
Brady Statistic AS VS Percent: 0 %
Brady Statistic RA Percent Paced: 3.13 %
Brady Statistic RV Percent Paced: 99.98 %
Date Time Interrogation Session: 20180328114440
HIGH POWER IMPEDANCE MEASURED VALUE: 48 Ohm
HIGH POWER IMPEDANCE MEASURED VALUE: 59 Ohm
Implantable Lead Implant Date: 20060505
Implantable Lead Location: 753858
Implantable Lead Location: 753859
Implantable Lead Location: 753860
Implantable Lead Model: 5076
Implantable Lead Model: 6947
Lead Channel Impedance Value: 205.2 Ohm
Lead Channel Impedance Value: 211.021
Lead Channel Impedance Value: 211.021
Lead Channel Impedance Value: 265.661
Lead Channel Impedance Value: 513 Ohm
Lead Channel Impedance Value: 551 Ohm
Lead Channel Impedance Value: 551 Ohm
Lead Channel Impedance Value: 665 Ohm
Lead Channel Impedance Value: 722 Ohm
Lead Channel Impedance Value: 779 Ohm
Lead Channel Impedance Value: 779 Ohm
Lead Channel Impedance Value: 931 Ohm
Lead Channel Pacing Threshold Amplitude: 0.75 V
Lead Channel Pacing Threshold Amplitude: 0.75 V
Lead Channel Sensing Intrinsic Amplitude: 1.5 mV
Lead Channel Setting Pacing Amplitude: 1.5 V
Lead Channel Setting Sensing Sensitivity: 0.3 mV
MDC IDC LEAD IMPLANT DT: 20120111
MDC IDC LEAD IMPLANT DT: 20180314
MDC IDC MSMT BATTERY VOLTAGE: 3.05 V
MDC IDC MSMT LEADCHNL LV IMPEDANCE VALUE: 171 Ohm
MDC IDC MSMT LEADCHNL LV IMPEDANCE VALUE: 342 Ohm
MDC IDC MSMT LEADCHNL LV IMPEDANCE VALUE: 342 Ohm
MDC IDC MSMT LEADCHNL LV IMPEDANCE VALUE: 551 Ohm
MDC IDC MSMT LEADCHNL LV IMPEDANCE VALUE: 665 Ohm
MDC IDC MSMT LEADCHNL LV PACING THRESHOLD PULSEWIDTH: 0.4 ms
MDC IDC MSMT LEADCHNL RA IMPEDANCE VALUE: 418 Ohm
MDC IDC MSMT LEADCHNL RA PACING THRESHOLD PULSEWIDTH: 0.4 ms
MDC IDC MSMT LEADCHNL RA SENSING INTR AMPL: 1.625 mV
MDC IDC MSMT LEADCHNL RV PACING THRESHOLD AMPLITUDE: 0.5 V
MDC IDC MSMT LEADCHNL RV PACING THRESHOLD PULSEWIDTH: 0.4 ms
MDC IDC PG IMPLANT DT: 20180314
MDC IDC SET LEADCHNL LV PACING AMPLITUDE: 2.25 V
MDC IDC SET LEADCHNL LV PACING PULSEWIDTH: 0.4 ms
MDC IDC SET LEADCHNL RA PACING AMPLITUDE: 1.25 V
MDC IDC SET LEADCHNL RV PACING PULSEWIDTH: 0.4 ms
MDC IDC STAT BRADY AP VS PERCENT: 0.01 %
MDC IDC STAT BRADY AS VP PERCENT: 96.87 %

## 2016-08-04 NOTE — Progress Notes (Signed)
Wound check appointment. Dermabond removed. Wound without redness or edema. Incision edges approximated, wound well healed. Normal device function. Thresholds, sensing, and impedances consistent with implant measurements. Device programmed with adaptive on. Effective Bi-Vp 100%.Histogram distribution appropriate for patient and level of activity. No mode switches or ventricular arrhythmias noted. Patient educated about wound care, arm mobility, lifting restrictions, shock plan. ROV 6/26 w/ SK

## 2016-09-08 ENCOUNTER — Encounter (HOSPITAL_COMMUNITY): Payer: Self-pay | Admitting: Internal Medicine

## 2016-10-18 ENCOUNTER — Encounter: Payer: Self-pay | Admitting: Family Medicine

## 2016-10-28 ENCOUNTER — Ambulatory Visit: Payer: Medicare Other | Admitting: Family Medicine

## 2016-11-02 ENCOUNTER — Encounter: Payer: Medicare Other | Admitting: Internal Medicine

## 2016-11-02 ENCOUNTER — Encounter: Payer: Self-pay | Admitting: Internal Medicine

## 2016-11-02 ENCOUNTER — Ambulatory Visit (INDEPENDENT_AMBULATORY_CARE_PROVIDER_SITE_OTHER): Payer: Medicare Other | Admitting: Internal Medicine

## 2016-11-02 VITALS — BP 159/91 | HR 62 | Ht 73.5 in | Wt 216.2 lb

## 2016-11-02 DIAGNOSIS — I428 Other cardiomyopathies: Secondary | ICD-10-CM

## 2016-11-02 DIAGNOSIS — I5022 Chronic systolic (congestive) heart failure: Secondary | ICD-10-CM | POA: Diagnosis not present

## 2016-11-02 DIAGNOSIS — I480 Paroxysmal atrial fibrillation: Secondary | ICD-10-CM | POA: Diagnosis not present

## 2016-11-02 DIAGNOSIS — Z95810 Presence of automatic (implantable) cardiac defibrillator: Secondary | ICD-10-CM

## 2016-11-02 DIAGNOSIS — I442 Atrioventricular block, complete: Secondary | ICD-10-CM

## 2016-11-02 LAB — CUP PACEART INCLINIC DEVICE CHECK
Battery Remaining Longevity: 104 mo
Brady Statistic AP VP Percent: 12.54 %
Brady Statistic AP VS Percent: 0.01 %
Brady Statistic AS VS Percent: 0 %
Brady Statistic RV Percent Paced: 99.98 %
Date Time Interrogation Session: 20180626154203
HIGH POWER IMPEDANCE MEASURED VALUE: 61 Ohm
HighPow Impedance: 50 Ohm
Implantable Lead Implant Date: 20120111
Implantable Lead Location: 753860
Implantable Lead Model: 6947
Lead Channel Impedance Value: 218.087
Lead Channel Impedance Value: 234.08 Ohm
Lead Channel Impedance Value: 245.538
Lead Channel Impedance Value: 456 Ohm
Lead Channel Impedance Value: 532 Ohm
Lead Channel Impedance Value: 589 Ohm
Lead Channel Impedance Value: 665 Ohm
Lead Channel Impedance Value: 722 Ohm
Lead Channel Impedance Value: 836 Ohm
Lead Channel Impedance Value: 931 Ohm
Lead Channel Pacing Threshold Amplitude: 0.5 V
Lead Channel Pacing Threshold Amplitude: 1.5 V
Lead Channel Pacing Threshold Pulse Width: 0.4 ms
Lead Channel Sensing Intrinsic Amplitude: 2.25 mV
Lead Channel Setting Pacing Amplitude: 1.25 V
Lead Channel Setting Pacing Amplitude: 1.5 V
Lead Channel Setting Pacing Amplitude: 2 V
Lead Channel Setting Pacing Pulse Width: 0.4 ms
Lead Channel Setting Pacing Pulse Width: 0.4 ms
MDC IDC LEAD IMPLANT DT: 20060505
MDC IDC LEAD IMPLANT DT: 20180314
MDC IDC LEAD LOCATION: 753858
MDC IDC LEAD LOCATION: 753859
MDC IDC MSMT BATTERY VOLTAGE: 3.03 V
MDC IDC MSMT LEADCHNL LV IMPEDANCE VALUE: 218.087
MDC IDC MSMT LEADCHNL LV IMPEDANCE VALUE: 228 Ohm
MDC IDC MSMT LEADCHNL LV IMPEDANCE VALUE: 418 Ohm
MDC IDC MSMT LEADCHNL LV IMPEDANCE VALUE: 456 Ohm
MDC IDC MSMT LEADCHNL LV IMPEDANCE VALUE: 760 Ohm
MDC IDC MSMT LEADCHNL LV IMPEDANCE VALUE: 760 Ohm
MDC IDC MSMT LEADCHNL LV IMPEDANCE VALUE: 779 Ohm
MDC IDC MSMT LEADCHNL LV PACING THRESHOLD PULSEWIDTH: 0.4 ms
MDC IDC MSMT LEADCHNL RA IMPEDANCE VALUE: 456 Ohm
MDC IDC MSMT LEADCHNL RA PACING THRESHOLD AMPLITUDE: 0.75 V
MDC IDC MSMT LEADCHNL RV PACING THRESHOLD PULSEWIDTH: 0.4 ms
MDC IDC PG IMPLANT DT: 20180314
MDC IDC SET LEADCHNL RV SENSING SENSITIVITY: 0.3 mV
MDC IDC STAT BRADY AS VP PERCENT: 87.45 %
MDC IDC STAT BRADY RA PERCENT PACED: 12.55 %

## 2016-11-02 MED ORDER — SACUBITRIL-VALSARTAN 49-51 MG PO TABS: 1.0000 | ORAL_TABLET | Freq: Two times a day (BID) | ORAL | Status: DC

## 2016-11-02 MED ORDER — SACUBITRIL-VALSARTAN 49-51 MG PO TABS: 1.0000 | ORAL_TABLET | Freq: Two times a day (BID) | ORAL | 0 refills | Status: DC

## 2016-11-02 NOTE — Patient Instructions (Addendum)
Medication Instructions: - Your physician has recommended you make the following change in your medication:  1) Stop cozaar (losartan)- you need to be off of this for 48 hours prior to starting entresto 2) Start entresto 49/51 mg- take one tablet by mouth twice daily  Labwork: - Your physician recommends that you return for lab work today: BMP/ TSH  Procedures/Testing: - none ordered  Follow-Up: - Your physician recommends that you schedule a follow-up appointment in: 2-3 weeks with the PA/ NP for possible uptitration of entresto.  - Remote monitoring is used to monitor your Pacemaker of ICD from home. This monitoring reduces the number of office visits required to check your device to one time per year. It allows [place] to keep an eye on the functioning of your device to ensure it is working properly. You are scheduled for a device check from home on 02/01/17. You may send your transmission at any time that day. If you have a wireless device, the transmission will be sent automatically. After your physician reviews your transmission, you will receive a postcard with your next transmission date.  - Your physician wants you to follow-up in: 9 months with Dr. Caryl Comes. You will receive a reminder letter in the mail two months in advance. If you don't receive a letter, please call our office to schedule the follow-up appointment.  Any Additional Special Instructions Will Be Listed Below (If Applicable).     If you need a refill on your cardiac medications before your next appointment, please call your pharmacy.

## 2016-11-02 NOTE — Progress Notes (Signed)
skf      Patient Care Team: Guadalupe Maple, MD as PCP - General (Unknown Physician Specialty)   HPI  Ryan [name] is a 66 y.o. male Seen in followup for an ICD implanted for syncope in the setting of a nonischemic cardiomyopathy. He is now device dependent. He had 6949 lead in place and underwent generator replacement with a new 6947-lead January 2012    He underwent CRT upgrade 3/18. He has more energy. He is able to keep up with his colleagues better. He is able to work 3-4 hours. He denies chest pain or shortness of breath except at the extremes of exercise. He has not had any palpitations or any syncope.    His blood pressure is a been poorly controlled at home generally running in the 140-160 range.  DATE TEST    3/14    Echo   EF 40-45 %   2/18    Echo   EF 35-40 %         Labs from 12/17 and demonstrated mild hypothyroidism.  Date Cr K TSH  12/17      5.44  3/16  1.31 4.3        Echocardiogram 3/14 demonstrated ejection fraction of 40-45% with significant left atrial enlargement  Past Medical History:  Diagnosis Date  . Anxiety   . Complete heart block ([place])   . Depression   . Dual ICD (implantable cardiac defibrillator-Medtronic    [phone] the implanted originally was 6947 implanted change out 2012  . GERD (gastroesophageal reflux disease)   . Gout   . Hypertension   . IBS (irritable bowel syndrome)   . Kidney stones   . Nonischemic cardiomyopathy ([place])   . Paroxysmal atrial fibrillation ([facility])   . Syncope     Past Surgical History:  Procedure Laterality Date  . BIV UPGRADE N/A 07/21/2016   Procedure: BiV Upgrade;  Surgeon: Deboraha Sprang, MD;  Location: [facility];  Service: Cardiovascular;  Laterality: N/A;  . CARDIAC DEFIBRILLATOR PLACEMENT    . LITHOTRIPSY    . UPPER ENDOSCOPY W/ ESOPHAGEAL MANOMETRY      Current Outpatient Prescriptions  Medication Sig Dispense Refill  . aspirin 81 MG EC tablet Take 81 mg by mouth daily.      .  carvedilol (COREG) 25 MG tablet Take 1 tablet (25 mg total) by mouth 2 (two) times daily. (Patient taking differently: Take 12.5 mg by mouth 2 (two) times daily. ) 180 tablet 4  . dexlansoprazole (DEXILANT) 60 MG capsule Take 1 capsule (60 mg total) by mouth daily. (Patient taking differently: Take 60 mg by mouth every evening. ) 90 capsule 4  . febuxostat (ULORIC) 40 MG tablet Take 1 tablet (40 mg total) by mouth daily. 90 tablet 4  . glipiZIDE (GLUCOTROL) 5 MG tablet Take 1 tablet (5 mg total) by mouth daily before breakfast. 90 tablet 4  . losartan (COZAAR) 100 MG tablet Take one tablet (100 mg) by mouth once daily at bedtime (Patient taking differently: Take 100 mg by mouth at bedtime. )    . ranitidine (ZANTAC) 300 MG tablet Take 1 tablet (300 mg total) by mouth daily. 90 tablet 4  . spironolactone (ALDACTONE) 25 MG tablet Take 1 tablet (25 mg total) by mouth daily. 90 tablet 2   No current facility-administered medications for this visit.     Allergies  Allergen Reactions  . Ace Inhibitors     Muscle aches   . Crestor [  Rosuvastatin Calcium] Other (See Comments)    Muscle aches    Review of Systems negative except from HPI and PMH  Physical Exam BP (!) 159/91 (BP Location: Left Arm, Patient Position: Sitting, Cuff Size: Normal)   Pulse 62   Ht 6' 1.5" (1.867 m)   Wt 216 lb 4 oz (98.1 kg)   BMI 28.14 kg/m  Well developed and nourished in no acute distress HENT normal Neck supple with JVP-flat Carotids brisk and full without bruits Clear Device pocket is well-healed Regular rate and rhythm, no murmurs or gallops Abd-soft with active BS without hepatomegaly No Clubbing cyanosis edema Skin-warm and dry A & Oriented  Grossly normal sensory and motor function    ECG demonstrates P synchronous pacing. The initial ECG had shown a negative QRS in lead V1 and biphasic in lead 1. Cleansing LV early by 10 ms we are able to make up right lead V1  Assessment and  Plan  Nonischemic  cardiomyopathy continue current medications. Class 1-2 symptoms no indication for Aldactone   Complete heart block stable post pacing  Paroxysmal atrial fibrillation    Status post ICD-Medtronic CRT    Chronic systolic heart failure     Hypertension  Hypothyroidism  Renal insufficiency   Euvolemic continue current meds   He has had significantly less lightheadedness. This follows down titration of his carvedilol. There is also less fatigue.   With his blood pressure being elevated and his ejection fraction being low we will transition him from losartan--Entresto starting at 49/51. We will have him seen again in about 3 weeks for up titration.  We will reassess his renal function as his creatinine was elevated his most recent check    We will check his TSH

## 2016-11-03 LAB — TSH: TSH: 5.79 u[IU]/mL — ABNORMAL HIGH (ref 0.450–4.500)

## 2016-11-03 LAB — BASIC METABOLIC PANEL
BUN/Creatinine Ratio: 11 (ref 10–24)
BUN: 13 mg/dL (ref 8–27)
CALCIUM: 9.5 mg/dL (ref 8.6–10.2)
CO2: 22 mmol/L (ref 20–29)
CREATININE: 1.18 mg/dL (ref 0.76–1.27)
Chloride: 103 mmol/L (ref 96–106)
GFR calc Af Amer: 74 mL/min/{1.73_m2} (ref >59)
GFR, EST NON AFRICAN AMERICAN: 64 mL/min/{1.73_m2} (ref >59)
GLUCOSE: 184 mg/dL — AB (ref 65–99)
Potassium: 4.6 mmol/L (ref 3.5–5.2)
Sodium: 140 mmol/L (ref 134–144)

## 2016-11-04 ENCOUNTER — Other Ambulatory Visit: Payer: Self-pay | Admitting: *Deleted

## 2016-11-04 ENCOUNTER — Encounter: Payer: Medicare Other | Admitting: Internal Medicine

## 2016-11-04 DIAGNOSIS — E039 Hypothyroidism, unspecified: Secondary | ICD-10-CM

## 2016-11-08 ENCOUNTER — Other Ambulatory Visit (INDEPENDENT_AMBULATORY_CARE_PROVIDER_SITE_OTHER): Payer: Medicare Other

## 2016-11-08 DIAGNOSIS — E039 Hypothyroidism, unspecified: Secondary | ICD-10-CM | POA: Diagnosis not present

## 2016-11-09 LAB — T3, FREE: T3, Free: 3.8 pg/mL (ref 2.0–4.4)

## 2016-11-09 LAB — T4, FREE: Free T4: 1.11 ng/dL (ref 0.82–1.77)

## 2016-11-12 ENCOUNTER — Ambulatory Visit (INDEPENDENT_AMBULATORY_CARE_PROVIDER_SITE_OTHER): Payer: Medicare Other | Admitting: Physician Assistant

## 2016-11-12 ENCOUNTER — Other Ambulatory Visit: Payer: Self-pay

## 2016-11-12 ENCOUNTER — Encounter: Payer: Self-pay | Admitting: Physician Assistant

## 2016-11-12 ENCOUNTER — Other Ambulatory Visit
Admission: RE | Admit: 2016-11-12 | Discharge: 2016-11-12 | Disposition: A | Discharge status: Home / Self Care | Payer: Medicare Other | Source: Ambulatory Visit | Attending: Physician Assistant | Admitting: Physician Assistant

## 2016-11-12 VITALS — BP 137/84 | HR 64 | Ht 76.0 in | Wt 213.5 lb

## 2016-11-12 DIAGNOSIS — I442 Atrioventricular block, complete: Secondary | ICD-10-CM

## 2016-11-12 DIAGNOSIS — I480 Paroxysmal atrial fibrillation: Secondary | ICD-10-CM

## 2016-11-12 DIAGNOSIS — I5022 Chronic systolic (congestive) heart failure: Secondary | ICD-10-CM

## 2016-11-12 DIAGNOSIS — I428 Other cardiomyopathies: Secondary | ICD-10-CM | POA: Diagnosis not present

## 2016-11-12 DIAGNOSIS — N289 Disorder of kidney and ureter, unspecified: Secondary | ICD-10-CM

## 2016-11-12 DIAGNOSIS — I10 Essential (primary) hypertension: Secondary | ICD-10-CM | POA: Insufficient documentation

## 2016-11-12 LAB — BASIC METABOLIC PANEL
Anion gap: 9 (ref 5–15)
BUN: 18 mg/dL (ref 6–20)
CALCIUM: 9.7 mg/dL (ref 8.9–10.3)
CHLORIDE: 104 mmol/L (ref 101–111)
CO2: 25 mmol/L (ref 22–32)
CREATININE: 1.34 mg/dL — AB (ref 0.61–1.24)
GFR calc Af Amer: >60 mL/min (ref >60)
GFR calc non Af Amer: 54 mL/min — ABNORMAL LOW (ref >60)
Glucose, Bld: 224 mg/dL — ABNORMAL HIGH (ref 65–99)
Potassium: 4.5 mmol/L (ref 3.5–5.1)
SODIUM: 138 mmol/L (ref 135–145)

## 2016-11-12 NOTE — Progress Notes (Signed)
Cardiology Office Note Date:  11/12/2016  Patient ID:  Ryan [name], DOB [birth year], [name] [id number] PCP:  Guadalupe Maple, MD  Cardiologist:  Dr. Caryl Comes, MD    Chief Complaint: BP follow up  History of Present Illness: Ryan [name] is a 66 y.o. male with history of NICM, complete heart block s/p prior PPM for syncope, s/p generator replacement with a new 6947-lead in 05/2010 s/p MDT CRT-D upgrade in 07/2016 now device dependent, PAF on ASA only, HTN, and hypothryoidism who presents for BP follow up.   Prior Echo in 07/2012 showed EF of 35-40%, severe HK of the mid-distalanterior myocardium with AK of the distalinferior myocardium, GR1DD, mild MR, mild to moderately dilated left atrium. Most recent echo from 06/2016 showed stable EF at 35-40%, severe HK of the mid-apicalanteroseptal and anterior myocardium and severe HK of the apicalinferior myocardium along with AK of the apical myocardium, GR1DD, calcified mitral annulus, mildly dilated left atrium. He was last seen by Dr. Caryl Comes on 11/02/2016 and noted more energy and being able to keep up with his colleagues better. BP at home had been in the 140-160 range. He has previously had some lightheadedness and fatigue which seemed to improve with down titration of his Coreg. Given his elevated BP, and in the setting of his cardiomyopathy, he was transitioned from losartan to Entresto 49/51 mg bid with planned possible titration in 3 weeks. Labs checked at that time showed a stable SCr at 1.18 with potassium 4.6. TSH was mildly elevated at 5.790 with free T4 and free T3 being normal.   He comes in doing well today. Tolerating entrust oh 49/51 mg twice a day without issues. Blood pressure at home has been ranging from the 1 teens to 297L systolic. Continues to feel like his energy is improving. No chest pain, shortness of breath, nausea, vomiting, dizziness, presyncope, or syncope. His only concern today is his blood pressure dropping too low with titration of  medication.  Past Medical History:  Diagnosis Date  . Anxiety   . Complete heart block ([place])   . Depression   . Dual ICD (implantable cardiac defibrillator-Medtronic    a. [id number] implanted originally s/p [phone] lead & generator change out 05/2010; b. upgraded to MDT CRT-D in 07/2016  . GERD (gastroesophageal reflux disease)   . Gout   . Hypertension   . IBS (irritable bowel syndrome)   . Kidney stones   . Nonischemic cardiomyopathy ([place])    a. Echo 07/2012 EF 35-40%, severe HK of the mid-distalanterior myocardium w/ AK of the distalinferior myocardium, GR1DD, mild MR, mild to mod dilated LA; b/ echo 06/2016 EF 35-40%, severe HK of the mid-apicalanteroseptal and anterior myocardium and severe HK of the apicalinferior myocardium along with AK of the apical myocardium, GR1DD, calcified mitral annulus, mildly dilated left atrium  . Paroxysmal atrial fibrillation ([place])    a. CHADS2VASc => 3 (CHF, HTN, age x 1); b. on ASA only  . Syncope     Past Surgical History:  Procedure Laterality Date  . BIV UPGRADE N/A 07/21/2016   Procedure: BiV Upgrade;  Surgeon: Deboraha Sprang, MD;  Location: [facility];  Service: Cardiovascular;  Laterality: N/A;  . CARDIAC DEFIBRILLATOR PLACEMENT    . LITHOTRIPSY    . UPPER ENDOSCOPY W/ ESOPHAGEAL MANOMETRY      Current Meds  Medication Sig  . carvedilol (COREG) 25 MG tablet Take 1 tablet (25 mg total) by mouth 2 (two) times daily. (Patient taking  differently: Take 12.5 mg by mouth 2 (two) times daily. )  . dexlansoprazole (DEXILANT) 60 MG capsule Take 1 capsule (60 mg total) by mouth daily. (Patient taking differently: Take 60 mg by mouth every evening. )  . febuxostat (ULORIC) 40 MG tablet Take 1 tablet (40 mg total) by mouth daily.  Marland Kitchen glipiZIDE (GLUCOTROL) 5 MG tablet Take 1 tablet (5 mg total) by mouth daily before breakfast.  . ranitidine (ZANTAC) 300 MG tablet Take 1 tablet (300 mg total) by mouth daily.  . sacubitril-valsartan (ENTRESTO) 49-51 MG Take  1 tablet by mouth 2 (two) times daily.  Marland Kitchen spironolactone (ALDACTONE) 25 MG tablet Take 1 tablet (25 mg total) by mouth daily.    Allergies:   Ace inhibitors and Crestor [rosuvastatin calcium]   Social History:  The patient  reports that he quit smoking about 26 years ago. His smoking use included Cigarettes. He has never used smokeless tobacco. He reports that he drinks about 4.2 oz of alcohol per week . He reports that he does not use drugs.   Family History:  The patient's family history includes Breast cancer in his mother; Cancer in his father and mother; Diabetes in his father, mother, and sister; Heart disease in his mother; Lung cancer in his father.  ROS:   Review of Systems  Constitutional: Negative for chills, diaphoresis, fever, malaise/fatigue and weight loss.  HENT: Negative for congestion.   Eyes: Negative for discharge and redness.  Respiratory: Negative for cough, hemoptysis, sputum production, shortness of breath and wheezing.   Cardiovascular: Negative for chest pain, palpitations, orthopnea, claudication, leg swelling and PND.  Gastrointestinal: Negative for abdominal pain, blood in stool, heartburn, melena, nausea and vomiting.  Genitourinary: Negative for hematuria.  Musculoskeletal: Negative for falls and myalgias.  Skin: Negative for rash.  Neurological: Negative for dizziness, tingling, tremors, sensory change, speech change, focal weakness, loss of consciousness and weakness.  Endo/Heme/Allergies: Does not bruise/bleed easily.  Psychiatric/Behavioral: Negative for substance abuse. The patient is not nervous/anxious.   All other systems reviewed and are negative.    PHYSICAL EXAM:  VS:  BP 137/84 (BP Location: Left Arm, Patient Position: Sitting, Cuff Size: Normal)   Pulse 64   Ht 6\' 4"  (1.93 m)   Wt 213 lb 8 oz (96.8 kg)   BMI 25.99 kg/m  BMI: Body mass index is 25.99 kg/m.  Physical Exam  Constitutional: He is oriented to person, place, and time. He  appears well-developed and well-nourished.  HENT:  Head: Normocephalic and atraumatic.  Eyes: Right eye exhibits no discharge. Left eye exhibits no discharge.  Neck: Normal range of motion. No JVD present.  Cardiovascular: Normal rate, regular rhythm, S1 normal, S2 normal and normal heart sounds.  Exam reveals no distant heart sounds, no friction rub, no midsystolic click and no opening snap.   No murmur heard. Pulmonary/Chest: Effort normal and breath sounds normal. No respiratory distress. He has no decreased breath sounds. He has no wheezes. He has no rales. He exhibits no tenderness.  Abdominal: Soft. He exhibits no distension. There is no tenderness.  Musculoskeletal: He exhibits no edema.  Neurological: He is alert and oriented to person, place, and time.  Skin: Skin is warm and dry. No cyanosis. Nails show no clubbing.  Psychiatric: He has a normal mood and affect. His speech is normal and behavior is normal. Judgment and thought content normal.    EKG:  Was ordered and interpreted by me today. Shows V paced rhythm, 64 bpm  Recent  Labs: 04/14/2016: ALT 46 07/13/2016: Hemoglobin 14.3; Platelets 189 11/02/2016: BUN 13; Creatinine, Ser 1.18; Potassium 4.6; Sodium 140; TSH 5.790  04/14/2016: Chol/HDL Ratio 4.5; Cholesterol, Total 150; HDL 33; LDL Calculated 68; Triglycerides 245   Estimated Creatinine Clearance: 75.6 mL/min (by C-G formula based on SCr of 1.18 mg/dL).   Wt Readings from Last 3 Encounters:  11/12/16 213 lb 8 oz (96.8 kg)  11/02/16 216 lb 4 oz (98.1 kg)  07/21/16 217 lb 4.8 oz (98.6 kg)     Other studies reviewed: Additional studies/records reviewed today include: summarized above  ASSESSMENT AND PLAN:  1. HTN: Improved at today's visit at 791 systolic when compared to prior readings. Patient is a little hesitant to titrate it is Entresto to max dose at this time given her blood pressure readings at home in the 1 teens to 505W systolic. These readings have been  obtained several hours after taking his morning medications. He will take his blood pressure prior to his a.m. and evening medications over the next 2 days and call us back on 7/9 with these readings. Titration of his interest oh will depend upon these baseline readings. Continue carvedilol 25 mg twice a day along with spironolactone 25 mg daily. Check BMP.  2. NICM/chronic systolic CHF: He does not appear grossly volume overloaded at this time. We'll consider titration of Entresto oh on 7/9 as above. Continue carvedilol 25 mg twice a day along with spironolactone 25 mg daily. CHF education provided.  3. PAF: Currently in V paced rhythm. Per primary cardiologist.  4. Complete heart block: Status post MDR CRT-D. Followed by Dr. Caryl Comes, MD.  5. Hypothyroidism: Recent thyroid function subclinical hypothyroid. Follow up with PCP.   6. Renal insufficiency: Recent bmet 6/26 with stable renal function. Check bmet today.   Disposition: F/u with Dr. Caryl Comes, MD in 3 months. Call with blood pressure readings on 7/9 with planned further recommendations regarding his Entrestio at that time.  Current medicines are reviewed at length with the patient today.  The patient did not have any concerns regarding medicines.  Melvern Banker PA-C 11/12/2016 2:48 PM     [place], [place] [address] [phone]

## 2016-11-12 NOTE — Patient Instructions (Signed)
Labwork: Please go to [facility] and check in at the first desk for labs to be done. We will call you with these results.    Follow-Up: Your physician has requested that you regularly monitor and record your blood pressure readings at home. Please use the same machine at the same time of day to check your readings and record them to bring to your follow-up visit. Please keep a log of your readings.   Please call us on Monday with your blood pressure readings.   Your physician recommends that you schedule a follow-up appointment in: 3 months with Dr. Caryl Comes or Christell Faith PA  It was a pleasure seeing you today here in the office. Please do not hesitate to give [place] a call back if you have any further questions. [place], BSN

## 2016-11-15 ENCOUNTER — Telehealth: Payer: Self-pay | Admitting: Internal Medicine

## 2016-11-15 NOTE — Telephone Encounter (Signed)
BP would allow for titration of Entresto, though his renal function was trending upwards on recheck bmet on 7/6. If renal function has improved on recheck bmet on 7/11 will plan to increase Entresto at that time followed by recheck bmet 1 week thereafter.

## 2016-11-15 NOTE — Telephone Encounter (Signed)
Reviewed Christell Faith PA's recommendations and patient verbalized understanding with no further questions at this time. He did report that his wife will be here today with mother and would like her to pick up samples for him. Let him know that next time he comes in we really need to have him sign release form so that we can discuss any information with his wife. He verbalized understanding of our conversation, agreement with plan, and had no further questions at this time.

## 2016-11-15 NOTE — Telephone Encounter (Signed)
Spoke with patient and he checked his blood pressure while on the phone and it was 142/82 with heart rate of 66. Let him know that I would forward these readings to Christell Faith PA for his review. He was very appreciative for the call and had no further questions at this time.

## 2016-11-15 NOTE — Telephone Encounter (Signed)
Pt calling to give BP readings   11/13/16  735 am  144/79 hr 66 4 pm       129/80 hr 72 11/14/16 630 am   120/75 hr 70 510 pm   140/85 hr 65 11/15/16  Has not taken it this morning   Please advise.

## 2016-11-16 ENCOUNTER — Ambulatory Visit (INDEPENDENT_AMBULATORY_CARE_PROVIDER_SITE_OTHER): Payer: Medicare Other | Admitting: Family Medicine

## 2016-11-16 ENCOUNTER — Encounter: Payer: Self-pay | Admitting: Family Medicine

## 2016-11-16 VITALS — BP 120/79 | HR 75 | Wt 207.0 lb

## 2016-11-16 DIAGNOSIS — E7800 Pure hypercholesterolemia, unspecified: Secondary | ICD-10-CM | POA: Diagnosis not present

## 2016-11-16 DIAGNOSIS — N182 Chronic kidney disease, stage 2 (mild): Secondary | ICD-10-CM | POA: Diagnosis not present

## 2016-11-16 DIAGNOSIS — I10 Essential (primary) hypertension: Secondary | ICD-10-CM

## 2016-11-16 DIAGNOSIS — E119 Type 2 diabetes mellitus without complications: Secondary | ICD-10-CM

## 2016-11-16 DIAGNOSIS — I509 Heart failure, unspecified: Secondary | ICD-10-CM

## 2016-11-16 LAB — LP+ALT+AST PICCOLO, WAIVED
ALT (SGPT) PICCOLO, WAIVED: 54 U/L — AB (ref 10–47)
AST (SGOT) PICCOLO, WAIVED: 32 U/L (ref 11–38)
CHOL/HDL RATIO PICCOLO,WAIVE: 5.7 mg/dL — AB
Cholesterol Piccolo, Waived: 145 mg/dL (ref <200)
HDL CHOL PICCOLO, WAIVED: 25 mg/dL — AB (ref >59)
LDL Chol Calc Piccolo Waived: 60 mg/dL (ref <100)
TRIGLYCERIDES PICCOLO,WAIVED: 299 mg/dL — AB (ref <150)
VLDL Chol Calc Piccolo,Waive: 60 mg/dL — ABNORMAL HIGH (ref <30)

## 2016-11-16 LAB — BAYER DCA HB A1C WAIVED: HB A1C (BAYER DCA - WAIVED): 7.6 % — ABNORMAL HIGH (ref <7.0)

## 2016-11-16 MED ORDER — EMPAGLIFLOZIN 25 MG PO TABS: 25.0000 mg | ORAL_TABLET | Freq: Every day | ORAL | 3 refills | Status: DC

## 2016-11-16 MED ORDER — DAPAGLIFLOZIN PROPANEDIOL 10 MG PO TABS: 10.0000 mg | ORAL_TABLET | Freq: Every day | ORAL | 3 refills | Status: DC

## 2016-11-16 NOTE — Assessment & Plan Note (Signed)
Blood pressures come down with new CHF medicine and doing better. No complaints from medications concerned about renal function which is being monitored.

## 2016-11-16 NOTE — Assessment & Plan Note (Signed)
Discussed CK D and medications will observe

## 2016-11-16 NOTE — Assessment & Plan Note (Signed)
The current medical regimen is effective;  continue present plan and medications.  

## 2016-11-16 NOTE — Assessment & Plan Note (Signed)
Poor control will add Jardiance patient education given on hydration and blood pressure

## 2016-11-16 NOTE — Progress Notes (Addendum)
BP 120/79   Pulse 75   Wt 207 lb (93.9 kg)   SpO2 99%   BMI 25.20 kg/m    Subjective:    Patient ID: Ryan [name], male    DOB: [birth year], 66 y.o.   [name]: [id number]  HPI: Ryan [name] is a 66 y.o. male  Chief Complaint  Patient presents with  . Follow-up  . Hypertension  Patient follow-up hypertension blood pressure doing well started Entresto and monitoring renal function is had a slight decline in GFR. Patient otherwise feeling much better also had adjustments of his pacemaker to adjust his heart response for eating and feeling much better fatigue has lifted. Diabetes is concerned about glipizide wants to stop that and go on another type medication such as Jardiance. Cholesterols doing well with medications no issues or concerns.   Relevant past medical, surgical, family and social history reviewed and updated as indicated. Interim medical history since our last visit reviewed. Allergies and medications reviewed and updated.  Review of Systems  Constitutional: Negative.   Respiratory: Negative.   Cardiovascular: Negative.     Per HPI unless specifically indicated above     Objective:    BP 120/79   Pulse 75   Wt 207 lb (93.9 kg)   SpO2 99%   BMI 25.20 kg/m   Wt Readings from Last 3 Encounters:  11/16/16 207 lb (93.9 kg)  11/12/16 213 lb 8 oz (96.8 kg)  11/02/16 216 lb 4 oz (98.1 kg)    Physical Exam  Constitutional: He is oriented to person, place, and time. He appears well-developed and well-nourished.  HENT:  Head: Normocephalic and atraumatic.  Eyes: Conjunctivae and EOM are normal.  Neck: Normal range of motion.  Cardiovascular: Normal rate, regular rhythm and normal heart sounds.   Pulmonary/Chest: Effort normal and breath sounds normal.  Musculoskeletal: Normal range of motion.  Neurological: He is alert and oriented to person, place, and time.  Skin: No erythema.  Psychiatric: He has a normal mood and affect. His behavior is normal. Judgment  and thought content normal.    Results for orders placed or performed in visit on 11/16/16  LP+ALT+AST Piccolo, [employer]  Result Value Ref Range   ALT (SGPT) Piccolo, Waived 54 (H) 10 - 47 U/L   AST (SGOT) Piccolo, Waived 32 11 - 38 U/L   Cholesterol Piccolo, Waived 145 <200 mg/dL   HDL Chol Piccolo, Waived 25 (L) >59 mg/dL   Triglycerides Piccolo,Waived 299 (H) <150 mg/dL   Chol/HDL Ratio Piccolo,Waive 5.7 (H) mg/dL   LDL Chol Calc Piccolo Waived 60 <100 mg/dL   VLDL Chol Calc Piccolo,Waive 60 (H) <30 mg/dL  Bayer DCA Hb A1c Waived  Result Value Ref Range   Bayer DCA Hb A1c Waived 7.6 (H) <7.0 %      Assessment & Plan:   Problem List Items Addressed This Visit      Cardiovascular and Mediastinum   Hypertension    Blood pressures come down with new CHF medicine and doing better. No complaints from medications concerned about renal function which is being monitored.      Relevant Orders   Basic metabolic panel   LP+ALT+AST Piccolo, Waived (Completed)   Bayer DCA Hb A1c Waived (Completed)   CHF (congestive heart failure) ([place])    The current medical regimen is effective;  continue present plan and medications.         Endocrine   Diabetes mellitus ([place]) - Primary    Poor control  will add Jardiance patient education given on hydration and blood pressure      Relevant Medications   empagliflozin (JARDIANCE) 25 MG TABS tablet   Other Relevant Orders   Bayer DCA Hb A1c Waived (Completed)     Genitourinary   CKD (chronic kidney disease), stage II    Discussed CK D and medications will observe        Other   Hyperlipidemia    The current medical regimen is effective;  continue present plan and medications.       Relevant Orders   Basic metabolic panel   LP+ALT+AST Piccolo, Waived (Completed)   Bayer DCA Hb A1c Waived (Completed)       Follow up plan: Return in about 3 months (around 02/16/2017) for Hemoglobin A1c, BMP.

## 2016-11-16 NOTE — Addendum Note (Signed)
Addended by: Golden Pop A on: 11/16/2016 12:04 PM   Modules accepted: Orders

## 2016-11-17 ENCOUNTER — Encounter: Payer: Self-pay | Admitting: Family Medicine

## 2016-11-17 ENCOUNTER — Telehealth: Payer: Self-pay | Admitting: Physician Assistant

## 2016-11-17 ENCOUNTER — Encounter: Payer: Self-pay | Admitting: *Deleted

## 2016-11-17 DIAGNOSIS — I10 Essential (primary) hypertension: Secondary | ICD-10-CM

## 2016-11-17 DIAGNOSIS — I429 Cardiomyopathy, unspecified: Secondary | ICD-10-CM

## 2016-11-17 DIAGNOSIS — I428 Other cardiomyopathies: Secondary | ICD-10-CM

## 2016-11-17 LAB — BASIC METABOLIC PANEL
BUN / CREAT RATIO: 15 (ref 10–24)
BUN: 20 mg/dL (ref 8–27)
CALCIUM: 9.4 mg/dL (ref 8.6–10.2)
CHLORIDE: 101 mmol/L (ref 96–106)
CO2: 21 mmol/L (ref 20–29)
CREATININE: 1.37 mg/dL — AB (ref 0.76–1.27)
GFR calc non Af Amer: 53 mL/min/{1.73_m2} — ABNORMAL LOW (ref >59)
GFR, EST AFRICAN AMERICAN: 62 mL/min/{1.73_m2} (ref >59)
Glucose: 230 mg/dL — ABNORMAL HIGH (ref 65–99)
Potassium: 4.6 mmol/L (ref 3.5–5.2)
Sodium: 138 mmol/L (ref 134–144)

## 2016-11-17 MED ORDER — SPIRONOLACTONE 25 MG PO TABS: 12.5000 mg | ORAL_TABLET | Freq: Every day | ORAL | 2 refills | Status: DC

## 2016-11-17 MED ORDER — SACUBITRIL-VALSARTAN 24-26 MG PO TABS: 1.0000 | ORAL_TABLET | Freq: Two times a day (BID) | ORAL | 6 refills | Status: DC

## 2016-11-17 NOTE — Telephone Encounter (Signed)
Patient states that he went to his PCP yesterday and they checked his labs there and he wanted to make sure we reviewed them in the computer so he would not have to repeat them at the [facility] entrance. Let him know that I would route this message to Christell Faith PA and make him aware of results. He was appreciative for the call and had no further questions at this time.

## 2016-11-17 NOTE — Telephone Encounter (Signed)
Pt calling stating he was advised to get labs drawn at ED last night but ended up going to his PCP for these   Would like [place] to know they are in the system and if we could use those labs instead of him having to go again and getting them drawn   Please advise.

## 2016-11-17 NOTE — Telephone Encounter (Signed)
Renal function continues to be elevated. Please decrease Entresto to 24/26 bid. Potassium is trending up as well. Please decrease hold spironolactone x 2 days then decrease to 12.5 mg daily. Will need bmet next week.

## 2016-11-17 NOTE — Telephone Encounter (Signed)
Reviewed results and recommendations with patient and he verbalized understanding. Instructed him to have repeat labs next week at the [facility] Entrance and check in at the first desk. Sent in prescriptions for medication changes and he verbalized understanding of all instructions, agreement with plan, and had no further questions at this time.

## 2016-11-22 ENCOUNTER — Telehealth: Payer: Self-pay | Admitting: Internal Medicine

## 2016-11-22 ENCOUNTER — Other Ambulatory Visit: Payer: Self-pay | Admitting: Family Medicine

## 2016-11-22 DIAGNOSIS — I429 Cardiomyopathy, unspecified: Secondary | ICD-10-CM

## 2016-11-22 DIAGNOSIS — I428 Other cardiomyopathies: Secondary | ICD-10-CM

## 2016-11-22 DIAGNOSIS — I10 Essential (primary) hypertension: Secondary | ICD-10-CM

## 2016-11-22 NOTE — Telephone Encounter (Signed)
Pharmacy calling stating patient told them that Dr Caryl Comes changed the dose on carvedilol  He is now taking half a tablet instead of a whole tablet.  They would like to make sure of this Please advise.

## 2016-11-22 NOTE — Telephone Encounter (Signed)
Please review patient's chart for refill. At last office visit the patient was told to continue on Carvediolol 25 mg one tablet twice a day. Please contact patient with instructions.

## 2016-11-22 NOTE — Telephone Encounter (Signed)
S/w patient. He states he has been taking Carvedilol 12.5 mg (1/2 tablet) by mouth twice a day since first device check on 08/04/16. It is noted in office visit with Dr Caryl Comes on 11/02/16: "He has had significantly less lightheadedness. This follows down titration of his carvedilol. There is also less fatigue." Dr Caryl Comes also reviewed medications that patent taking carvedilol 12.5 mg by mouth twice a day.  Patient would like a refill with the 12.5 mg tablet so he does not have to split them anymore and just for a 30-day supply.  Will route to Dr Caryl Comes to confirm ok for refill at Carvedilol 12.5 mg by mouth twice a day.

## 2016-11-23 MED ORDER — CARVEDILOL 12.5 MG PO TABS: 12.5000 mg | ORAL_TABLET | Freq: Two times a day (BID) | ORAL | 3 refills | Status: DC

## 2016-11-23 NOTE — Telephone Encounter (Signed)
I spoke with the patient and advised him I would send in coreg 12.5 mg strength tablets to the pharmacy. He does prefer # 90 day supply.   RX sent to the pharmacy.

## 2016-11-23 NOTE — Telephone Encounter (Signed)
  Last routine OV: 11/16/16 Next OV: 04/27/17

## 2016-11-24 ENCOUNTER — Other Ambulatory Visit
Admission: RE | Admit: 2016-11-24 | Discharge: 2016-11-24 | Disposition: A | Discharge status: Home / Self Care | Payer: Medicare Other | Source: Ambulatory Visit | Attending: Physician Assistant | Admitting: Physician Assistant

## 2016-11-24 DIAGNOSIS — I10 Essential (primary) hypertension: Secondary | ICD-10-CM | POA: Diagnosis not present

## 2016-11-24 DIAGNOSIS — I428 Other cardiomyopathies: Secondary | ICD-10-CM | POA: Diagnosis not present

## 2016-11-24 DIAGNOSIS — I429 Cardiomyopathy, unspecified: Secondary | ICD-10-CM

## 2016-11-24 LAB — BASIC METABOLIC PANEL
Anion gap: 8 (ref 5–15)
BUN: 19 mg/dL (ref 6–20)
CHLORIDE: 105 mmol/L (ref 101–111)
CO2: 24 mmol/L (ref 22–32)
CREATININE: 1.37 mg/dL — AB (ref 0.61–1.24)
Calcium: 9.3 mg/dL (ref 8.9–10.3)
GFR calc non Af Amer: 52 mL/min — ABNORMAL LOW (ref >60)
Glucose, Bld: 286 mg/dL — ABNORMAL HIGH (ref 65–99)
Potassium: 4.4 mmol/L (ref 3.5–5.1)
SODIUM: 137 mmol/L (ref 135–145)

## 2016-11-25 ENCOUNTER — Telehealth: Payer: Self-pay | Admitting: *Deleted

## 2016-11-25 DIAGNOSIS — I5022 Chronic systolic (congestive) heart failure: Secondary | ICD-10-CM

## 2016-11-25 NOTE — Telephone Encounter (Signed)
Left voicemail message to call back  

## 2016-11-25 NOTE — Telephone Encounter (Signed)
Christell Faith PA discussed with Dr. Caryl Comes. Received verbal order to have patient discontinue Entresto and recheck BMET in 2 weeks and to also have him monitor his blood pressure readings with instructions to call if it is greater than 140/80. Left voicemail message for patient to call back regarding medication changes.

## 2016-11-25 NOTE — Telephone Encounter (Signed)
-----   Message from Rise Mu, PA-C sent at 11/24/2016 11:14 AM EDT ----- Renal function stable, though remains elevated at 1.37. I will forward to Dr. Caryl Comes to see if he would like for the patient to remain on Entresto.

## 2016-11-26 NOTE — Telephone Encounter (Signed)
Spoke with patient and reviewed lab results and recommendations. Instructed him to discontinue the [facility] and then recheck labs in 2 weeks over at the [facility] at [facility]. Also reviewed with him to start keeping a log of his blood pressure readings and to call us if they remain consistently greater than 140/80. He verbalized understanding of our discussion, agreement with plan, and had no further questions at this time.

## 2016-12-03 ENCOUNTER — Telehealth: Payer: Self-pay | Admitting: Physician Assistant

## 2016-12-03 DIAGNOSIS — I429 Cardiomyopathy, unspecified: Secondary | ICD-10-CM

## 2016-12-03 DIAGNOSIS — I5022 Chronic systolic (congestive) heart failure: Secondary | ICD-10-CM

## 2016-12-03 DIAGNOSIS — I428 Other cardiomyopathies: Secondary | ICD-10-CM

## 2016-12-03 DIAGNOSIS — I10 Essential (primary) hypertension: Secondary | ICD-10-CM

## 2016-12-03 NOTE — Telephone Encounter (Signed)
Called patient to discuss his medications and Christell Faith PA discontinued the [place] previously after discussion with Dr. Caryl Comes. Patient is due to have repeat labs and he also had some blood pressure readings which we requested. Instructed him to continue monitoring his blood pressures and that I would send a message to [employer] PA with these readings and for further recommendations. Patient was very appreciative for the call back and had no further questions or needs at this time.   Blood pressure readings 11/30/16  3:50 pm 147/99 71 8:45 pm 145/81 66  12/01/16 7:20 am 134/77 66 9:30 pm 133/80 68  12/02/16  7:20 am144/80 69 8:05 pm 130/74 80  12/03/16  8:40 am 131/81 70

## 2016-12-06 NOTE — Telephone Encounter (Signed)
Dr. Caryl Comes told me to discontinue. Please route to him so he can make a final decision.

## 2016-12-07 NOTE — Telephone Encounter (Signed)
Will review with Dr. Caryl Comes- entresto was stopped due to renal function initially.

## 2016-12-07 NOTE — Telephone Encounter (Signed)
His renal funciton has been stable on this dose, kind of-- if he can tolerate lets try and continue for a few more weeks and recheck Cr

## 2016-12-09 NOTE — Telephone Encounter (Signed)
Patient due for repeat BMP around 12/10/16- will await results and review with Dr. Caryl Comes again prior to calling the patient back.

## 2016-12-10 ENCOUNTER — Other Ambulatory Visit
Admission: RE | Admit: 2016-12-10 | Discharge: 2016-12-10 | Disposition: A | Discharge status: Home / Self Care | Payer: Medicare Other | Source: Ambulatory Visit | Attending: Physician Assistant | Admitting: Physician Assistant

## 2016-12-10 ENCOUNTER — Telehealth: Payer: Self-pay | Admitting: Physician Assistant

## 2016-12-10 DIAGNOSIS — I5022 Chronic systolic (congestive) heart failure: Secondary | ICD-10-CM | POA: Diagnosis not present

## 2016-12-10 LAB — BASIC METABOLIC PANEL
Anion gap: 7 (ref 5–15)
BUN: 19 mg/dL (ref 6–20)
CHLORIDE: 106 mmol/L (ref 101–111)
CO2: 24 mmol/L (ref 22–32)
CREATININE: 1.4 mg/dL — AB (ref 0.61–1.24)
Calcium: 9.2 mg/dL (ref 8.9–10.3)
GFR calc Af Amer: 59 mL/min — ABNORMAL LOW (ref >60)
GFR calc non Af Amer: 51 mL/min — ABNORMAL LOW (ref >60)
Glucose, Bld: 223 mg/dL — ABNORMAL HIGH (ref 65–99)
Potassium: 4.1 mmol/L (ref 3.5–5.1)
Sodium: 137 mmol/L (ref 135–145)

## 2016-12-13 ENCOUNTER — Encounter: Payer: Self-pay | Admitting: Family Medicine

## 2016-12-13 ENCOUNTER — Ambulatory Visit (INDEPENDENT_AMBULATORY_CARE_PROVIDER_SITE_OTHER): Payer: Medicare Other | Admitting: Family Medicine

## 2016-12-13 VITALS — BP 144/87 | HR 66 | Temp 97.9°F | Wt 207.0 lb

## 2016-12-13 DIAGNOSIS — R490 Dysphonia: Secondary | ICD-10-CM

## 2016-12-13 MED ORDER — RANITIDINE HCL 300 MG PO TABS: 300.0000 mg | ORAL_TABLET | Freq: Two times a day (BID) | ORAL | 4 refills | Status: DC

## 2016-12-13 MED ORDER — FLUTICASONE PROPIONATE 50 MCG/ACT NA SUSP: 2.0000 | Freq: Every day | NASAL | 6 refills | Status: DC

## 2016-12-13 MED ORDER — CETIRIZINE HCL 10 MG PO TABS: 10.0000 mg | ORAL_TABLET | Freq: Every day | ORAL | 11 refills | Status: DC

## 2016-12-13 NOTE — Telephone Encounter (Signed)
Made in error

## 2016-12-13 NOTE — Progress Notes (Signed)
   BP (!) 144/87   Pulse 66   Temp 97.9 F (36.6 C)   Wt 207 lb (93.9 kg)   SpO2 100%   BMI 25.20 kg/m    Subjective:    Patient ID: Ryan [name], male    DOB: 1951-02-22, 66 y.o.   [name]: [id number]  HPI: Ryan [name] is a 66 y.o. male  Chief Complaint  Patient presents with  . Laryngitis    x 2 weeks, has had an EGD, has reflux and scar tissue. Thinks it is just flared up. Some cough, no other URI symptoms.   Patient presents with about 2 weeks of hoarseness and mild throat discomfort, post-nasal drip, and dry cough. Seems to have flared since he started working outside in the yard more often. This has happened in the past and antihistamines seemed to help. Known GERD with scarring per EGD in the past, under good control with dexilant and zantac. Wanting to increase zantac to BID if possible.   Relevant past medical, surgical, family and social history reviewed and updated as indicated. Interim medical history since our last visit reviewed. Allergies and medications reviewed and updated.  Review of Systems  Constitutional: Negative.   HENT: Positive for postnasal drip, rhinorrhea and voice change.   Respiratory: Negative.   Cardiovascular: Negative.   Gastrointestinal: Negative.   Genitourinary: Negative.   Musculoskeletal: Negative.   Neurological: Negative.   Psychiatric/Behavioral: Negative.    Per HPI unless specifically indicated above     Objective:    BP (!) 144/87   Pulse 66   Temp 97.9 F (36.6 C)   Wt 207 lb (93.9 kg)   SpO2 100%   BMI 25.20 kg/m   Wt Readings from Last 3 Encounters:  12/13/16 207 lb (93.9 kg)  11/16/16 207 lb (93.9 kg)  11/12/16 213 lb 8 oz (96.8 kg)    Physical Exam  Constitutional: He is oriented to person, place, and time. He appears well-developed and well-nourished. No distress.  HENT:  Head: Atraumatic.  Right Ear: External ear normal.  Left Ear: External ear normal.  Hoarse voice Mildly erythematous oropharynx Nasal  mucosa injected and boggy  Eyes: Pupils are equal, round, and reactive to light. Conjunctivae are normal.  Neck: Normal range of motion. Neck supple.  Cardiovascular: Normal rate and normal heart sounds.   Pulmonary/Chest: Effort normal and breath sounds normal. No respiratory distress.  Musculoskeletal: Normal range of motion.  Neurological: He is alert and oriented to person, place, and time. No cranial nerve deficit.  Skin: Skin is warm and dry.  Psychiatric: He has a normal mood and affect. His behavior is normal.  Nursing note and vitals reviewed.     Assessment & Plan:   Problem List Items Addressed This Visit    None    Visit Diagnoses    Hoarseness    -  Primary   Suspect allergy irritation from PND. Flonase, zyrtec, salt water gargles. Continue GERD regimen. F/u if no improvement       Follow up plan: Return if symptoms worsen or fail to improve.

## 2016-12-14 NOTE — Telephone Encounter (Signed)
Repeat BMP from 12/10/16 shows Creatinine 1.4/ BUN-19 Reviewed with Dr. Caryl Comes - restart entresto- was going to start at previous dose, but will restart at 24/26 mg BID with a repeat BMP in 2 weeks- will forward to [facility] triage to try to contact the patient tomorrow.

## 2016-12-15 MED ORDER — SACUBITRIL-VALSARTAN 24-26 MG PO TABS: 1.0000 | ORAL_TABLET | Freq: Two times a day (BID) | ORAL | 6 refills | Status: DC

## 2016-12-15 NOTE — Telephone Encounter (Signed)
Spoke with patient and reviewed results and recommendations by Dr. Caryl Comes. Instructed patient to start back on the Entresto 24/26 mg twice a day and then have repeat labs in 2 weeks. Orders placed for both and patient aware to have labs done at the [facility] around 8/22 and that we will call him back with those results. Patient verbalized understanding of all instructions, agreement with plan, and had no further questions at this time.

## 2016-12-15 NOTE — Addendum Note (Signed)
Addended by: Valora Corporal on: 12/15/2016 08:30 AM   Modules accepted: Orders

## 2016-12-15 NOTE — Patient Instructions (Signed)
Follow up as needed

## 2016-12-23 ENCOUNTER — Encounter: Payer: Self-pay | Admitting: Family Medicine

## 2016-12-23 ENCOUNTER — Ambulatory Visit (INDEPENDENT_AMBULATORY_CARE_PROVIDER_SITE_OTHER): Payer: Medicare Other | Admitting: Family Medicine

## 2016-12-23 DIAGNOSIS — R490 Dysphonia: Secondary | ICD-10-CM | POA: Diagnosis not present

## 2016-12-23 DIAGNOSIS — E119 Type 2 diabetes mellitus without complications: Secondary | ICD-10-CM

## 2016-12-23 NOTE — Progress Notes (Signed)
BP (!) 142/84   Pulse 67   Temp 97.9 F (36.6 C) (Oral)   Ht 6' 1.5" (1.867 m)   Wt 200 lb (90.7 kg)   SpO2 98%   BMI 26.03 kg/m    Subjective:    Patient ID: Ryan [name], male    DOB: 06/01/1950, 66 y.o.   [name]: [id number]  HPI: Ryan [name] is a 67 y.o. male  Chief Complaint  Patient presents with  . Hoarse   Patient's been hoarse for 6+ weeks has been taking antihistamine with surgery tachycardia and Flonase with no relief took a round of Z-Pak with no relief has been continuing to take DEXA labs and ranitidine 300 mg twice a day with no relief. Still just hoarseness but otherwise feels completely well. Patient is able to do activities of daily living with very active lifestyle without problems. Reviewed medications with no notice of hoarseness as a side effect no side effects from his heart or medication.  Relevant past medical, surgical, family and social history reviewed and updated as indicated. Interim medical history since our last visit reviewed. Allergies and medications reviewed and updated.  Review of Systems  Constitutional: Negative.   Respiratory: Negative.   Cardiovascular: Negative.     Per HPI unless specifically indicated above     Objective:    BP (!) 142/84   Pulse 67   Temp 97.9 F (36.6 C) (Oral)   Ht 6' 1.5" (1.867 m)   Wt 200 lb (90.7 kg)   SpO2 98%   BMI 26.03 kg/m   Wt Readings from Last 3 Encounters:  12/23/16 200 lb (90.7 kg)  12/13/16 207 lb (93.9 kg)  11/16/16 207 lb (93.9 kg)    Physical Exam  Constitutional: He is oriented to person, place, and time. He appears well-developed and well-nourished.  HENT:  Head: Normocephalic and atraumatic.  Right Ear: External ear normal.  Left Ear: External ear normal.  Nose: Nose normal.  Mouth/Throat: Oropharynx is clear and moist.  Eyes: Conjunctivae and EOM are normal.  Neck: Normal range of motion. Neck supple. No JVD present. No tracheal deviation present. No thyromegaly  present.  Cardiovascular: Normal rate, regular rhythm and normal heart sounds.   Pulmonary/Chest: Effort normal and breath sounds normal. No stridor.  Musculoskeletal: Normal range of motion.  Lymphadenopathy:    He has no cervical adenopathy.  Neurological: He is alert and oriented to person, place, and time.  Skin: No erythema.  Psychiatric: He has a normal mood and affect. His behavior is normal. Judgment and thought content normal.    Results for orders placed or performed during the hospital encounter of 85/02/77  Basic Metabolic Panel (BMET)  Result Value Ref Range   Sodium 137 135 - 145 mmol/L   Potassium 4.1 3.5 - 5.1 mmol/L   Chloride 106 101 - 111 mmol/L   CO2 24 22 - 32 mmol/L   Glucose, Bld 223 (H) 65 - 99 mg/dL   BUN 19 6 - 20 mg/dL   Creatinine, Ser 1.40 (H) 0.61 - 1.24 mg/dL   Calcium 9.2 8.9 - 10.3 mg/dL   GFR calc non Af Amer 51 (L) >60 mL/min   GFR calc Af Amer 59 (L) >60 mL/min   Anion gap 7 5 - 15      Assessment & Plan:   Problem List Items Addressed This Visit      Endocrine   Diabetes mellitus ([place])    No sx on going good control  Other   Hoarseness    Ongoing chronic hoarseness in spite of good allergy and reflux treatment and no response to antibiotics. Will refer to ear nose and throat to further evaluate.      Relevant Orders   Ambulatory referral to ENT       Follow up plan: Return for As scheduled.

## 2016-12-23 NOTE — Assessment & Plan Note (Signed)
No sx on going good control

## 2016-12-23 NOTE — Assessment & Plan Note (Signed)
Ongoing chronic hoarseness in spite of good allergy and reflux treatment and no response to antibiotics. Will refer to ear nose and throat to further evaluate.

## 2016-12-27 DIAGNOSIS — R490 Dysphonia: Secondary | ICD-10-CM | POA: Diagnosis not present

## 2016-12-27 DIAGNOSIS — D380 Neoplasm of uncertain behavior of larynx: Secondary | ICD-10-CM | POA: Diagnosis not present

## 2016-12-29 ENCOUNTER — Encounter: Payer: Self-pay | Admitting: *Deleted

## 2016-12-29 ENCOUNTER — Telehealth: Payer: Self-pay | Admitting: Physician Assistant

## 2016-12-29 NOTE — Telephone Encounter (Signed)
Faxed request for cardiac rhythm management device data and perioperative plan from Dr. Riley Nearing office for August 28 Laryngoscopy with excision of right vocal cord lesion to [facility] as Dr. Caryl Comes is out of the office until September

## 2016-12-31 ENCOUNTER — Other Ambulatory Visit
Admission: RE | Admit: 2016-12-31 | Discharge: 2016-12-31 | Disposition: A | Discharge status: Home / Self Care | Payer: Medicare Other | Source: Ambulatory Visit | Attending: Physician Assistant | Admitting: Physician Assistant

## 2016-12-31 DIAGNOSIS — I10 Essential (primary) hypertension: Secondary | ICD-10-CM | POA: Diagnosis not present

## 2016-12-31 DIAGNOSIS — I5022 Chronic systolic (congestive) heart failure: Secondary | ICD-10-CM | POA: Diagnosis not present

## 2016-12-31 DIAGNOSIS — I428 Other cardiomyopathies: Secondary | ICD-10-CM | POA: Insufficient documentation

## 2016-12-31 DIAGNOSIS — I429 Cardiomyopathy, unspecified: Secondary | ICD-10-CM

## 2016-12-31 LAB — BASIC METABOLIC PANEL
ANION GAP: 9 (ref 5–15)
BUN: 17 mg/dL (ref 6–20)
CHLORIDE: 108 mmol/L (ref 101–111)
CO2: 23 mmol/L (ref 22–32)
Calcium: 9.6 mg/dL (ref 8.9–10.3)
Creatinine, Ser: 1.3 mg/dL — ABNORMAL HIGH (ref 0.61–1.24)
GFR calc Af Amer: >60 mL/min (ref >60)
GFR calc non Af Amer: 56 mL/min — ABNORMAL LOW (ref >60)
GLUCOSE: 136 mg/dL — AB (ref 65–99)
POTASSIUM: 4 mmol/L (ref 3.5–5.1)
Sodium: 140 mmol/L (ref 135–145)

## 2017-01-04 ENCOUNTER — Ambulatory Visit: Admission: RE | Admit: 2017-01-04 | Payer: Medicare Other | Source: Ambulatory Visit | Admitting: Otolaryngology

## 2017-01-04 HISTORY — DX: Type 2 diabetes mellitus without complications: E11.9

## 2017-01-04 SURGERY — LARYNGOSCOPY
Anesthesia: General | Laterality: Right

## 2017-01-05 ENCOUNTER — Encounter
Admission: RE | Admit: 2017-01-05 | Discharge: 2017-01-05 | Disposition: A | Discharge status: Home / Self Care | Payer: Medicare Other | Source: Ambulatory Visit | Attending: Otolaryngology | Admitting: Otolaryngology

## 2017-01-05 DIAGNOSIS — R490 Dysphonia: Secondary | ICD-10-CM | POA: Diagnosis not present

## 2017-01-05 DIAGNOSIS — L570 Actinic keratosis: Secondary | ICD-10-CM | POA: Diagnosis not present

## 2017-01-05 DIAGNOSIS — Z79899 Other long term (current) drug therapy: Secondary | ICD-10-CM | POA: Diagnosis not present

## 2017-01-05 DIAGNOSIS — J383 Other diseases of vocal cords: Secondary | ICD-10-CM | POA: Diagnosis not present

## 2017-01-05 HISTORY — DX: Personal history of urinary calculi: Z87.442

## 2017-01-05 HISTORY — DX: Presence of cardiac pacemaker: Z95.0

## 2017-01-05 LAB — CBC
HCT: 42.4 % (ref 40.0–52.0)
HEMOGLOBIN: 14.9 g/dL (ref 13.0–18.0)
MCH: 31.7 pg (ref 26.0–34.0)
MCHC: 35.1 g/dL (ref 32.0–36.0)
MCV: 90.2 fL (ref 80.0–100.0)
Platelets: 168 10*3/uL (ref 150–440)
RBC: 4.7 MIL/uL (ref 4.40–5.90)
RDW: 13.2 % (ref 11.5–14.5)
WBC: 6.3 10*3/uL (ref 3.8–10.6)

## 2017-01-05 NOTE — Pre-Procedure Instructions (Signed)
PACEMAKER/DEFIB PREOP SHEET CALLED AND FAXED TO DR Virl Axe. SPOKE WITH ERIN

## 2017-01-05 NOTE — Patient Instructions (Signed)
Your procedure is scheduled on: 01/06/17 Report to [place]. 2ND FLOOR MEDICAL MALL ENTRANCE. To find out your arrival time please call [phone] between 1PM - 3PM on today.  Remember: Instructions that are not followed completely may result in serious medical risk, up to and including death, or upon the discretion of your surgeon and anesthesiologist your surgery may need to be rescheduled.    __X__ 1. Do not eat anything after midnight the night before your    procedure.  No gum chewing or hard candies.  You may drink clear   liquids up to 2 hours before you are scheduled to arrive at the   hospital for your procedure. Do not drink clear liquids within 2   hours of scheduled arrival to the hospital as this may lead to your   procedure being delayed or rescheduled.       Clear liquids include:   Water or Apple juice without pulp   Clear carbohydrate beverage such as Clearfast or Gatorade   Black coffee or Clear Tea 9no milk, no creamer, do not add anything   to the coffee or tea)    Diabetics should only drink water   __X__ 2. No Alcohol for 24 hours before or after surgery.   ____ 3. Bring all medications with you on the day of surgery if instructed.    __X__ 4. Notify your doctor if there is any change in your medical condition     (cold, fever, infections).             __X___5. No smoking within 24 hours of your surgery.     Do not wear jewelry, make-up, hairpins, clips or nail polish.  Do not wear lotions, powders, or perfumes.   Do not shave 48 hours prior to surgery. Men may shave face and neck.  Do not bring valuables to the hospital.    [facility] is not responsible for any belongings or valuables.               Contacts, dentures or bridgework may not be worn into surgery.  Leave your suitcase in the car. After surgery it may be brought to your room.  For patients admitted to the hospital, discharge time is determined by your                treatment  team.   Patients discharged the day of surgery will not be allowed to drive home.   Please read over the following fact sheets that you were given:   MRSA Information   __x__ Take these medicines the morning of surgery with A SIP OF WATER:    1. carvedilol  2. dexilant  3.   4.  5.  6.  ____ Fleet Enema (as directed)   ____ Use CHG Soap/SAGE wipes as directed  ____ Use inhalers on the day of surgery  ____ Stop metformin 2 days prior to surgery    ____ Take 1/2 of usual insulin dose the night before surgery and none on the morning of surgery.   ____ Stop Coumadin/Plavix/aspirin on   __X__ Stop Anti-inflammatories such as Advil, Aleve, Ibuprofen, Motrin, Naproxen, Naprosyn, Goodies,powder, or aspirin products.  OK to take Tylenol.   __X__ Stop supplements, Vitamin E, Fish Oil until after surgery.    ____ Bring C-Pap to the hospital.

## 2017-01-06 ENCOUNTER — Encounter: Payer: Self-pay | Admitting: *Deleted

## 2017-01-06 ENCOUNTER — Ambulatory Visit: Payer: Medicare Other | Admitting: Anesthesiology

## 2017-01-06 ENCOUNTER — Encounter
Admission: RE | Disposition: A | Discharge status: Home / Self Care | Payer: Self-pay | Source: Ambulatory Visit | Attending: Otolaryngology

## 2017-01-06 ENCOUNTER — Ambulatory Visit
Admission: RE | Admit: 2017-01-06 | Discharge: 2017-01-06 | Disposition: A | Discharge status: Home / Self Care | Payer: Medicare Other | Source: Ambulatory Visit | Attending: Otolaryngology | Admitting: Otolaryngology

## 2017-01-06 DIAGNOSIS — R490 Dysphonia: Secondary | ICD-10-CM | POA: Insufficient documentation

## 2017-01-06 DIAGNOSIS — F419 Anxiety disorder, unspecified: Secondary | ICD-10-CM | POA: Diagnosis not present

## 2017-01-06 DIAGNOSIS — I509 Heart failure, unspecified: Secondary | ICD-10-CM | POA: Diagnosis not present

## 2017-01-06 DIAGNOSIS — N189 Chronic kidney disease, unspecified: Secondary | ICD-10-CM | POA: Diagnosis not present

## 2017-01-06 DIAGNOSIS — Z79899 Other long term (current) drug therapy: Secondary | ICD-10-CM | POA: Diagnosis not present

## 2017-01-06 DIAGNOSIS — J383 Other diseases of vocal cords: Secondary | ICD-10-CM | POA: Diagnosis not present

## 2017-01-06 DIAGNOSIS — I130 Hypertensive heart and chronic kidney disease with heart failure and stage 1 through stage 4 chronic kidney disease, or unspecified chronic kidney disease: Secondary | ICD-10-CM | POA: Diagnosis not present

## 2017-01-06 DIAGNOSIS — L570 Actinic keratosis: Secondary | ICD-10-CM | POA: Diagnosis not present

## 2017-01-06 DIAGNOSIS — J387 Other diseases of larynx: Secondary | ICD-10-CM | POA: Diagnosis not present

## 2017-01-06 DIAGNOSIS — F329 Major depressive disorder, single episode, unspecified: Secondary | ICD-10-CM | POA: Diagnosis not present

## 2017-01-06 DIAGNOSIS — E1122 Type 2 diabetes mellitus with diabetic chronic kidney disease: Secondary | ICD-10-CM | POA: Diagnosis not present

## 2017-01-06 HISTORY — PX: LARYNGOSCOPY: SHX5203

## 2017-01-06 LAB — GLUCOSE, CAPILLARY
GLUCOSE-CAPILLARY: 157 mg/dL — AB (ref 65–99)
GLUCOSE-CAPILLARY: 157 mg/dL — AB (ref 65–99)

## 2017-01-06 SURGERY — LARYNGOSCOPY
Anesthesia: General | Laterality: Right

## 2017-01-06 MED ORDER — ONDANSETRON HCL 4 MG/2ML IJ SOLN
INTRAMUSCULAR | Status: DC | PRN
  Administered 2017-01-06: 4 mg via INTRAVENOUS

## 2017-01-06 MED ORDER — OXYMETAZOLINE HCL 0.05 % NA SOLN
NASAL | Status: DC | PRN
  Administered 2017-01-06: 1 via TOPICAL

## 2017-01-06 MED ORDER — OXYCODONE HCL 5 MG/5ML PO SOLN: 5.0000 mg | Freq: Once | ORAL | Status: DC | PRN

## 2017-01-06 MED ORDER — MIDAZOLAM HCL 2 MG/2ML IJ SOLN
INTRAMUSCULAR | Status: DC | PRN
  Administered 2017-01-06: 2 mg via INTRAVENOUS

## 2017-01-06 MED ORDER — LIDOCAINE HCL (PF) 2 % IJ SOLN
INTRAMUSCULAR | Status: AC
  Filled 2017-01-06: qty 2

## 2017-01-06 MED ORDER — PROPOFOL 10 MG/ML IV BOLUS
INTRAVENOUS | Status: AC
  Filled 2017-01-06: qty 20

## 2017-01-06 MED ORDER — SUGAMMADEX SODIUM 200 MG/2ML IV SOLN
INTRAVENOUS | Status: DC | PRN
  Administered 2017-01-06: 200 mg via INTRAVENOUS

## 2017-01-06 MED ORDER — SUGAMMADEX SODIUM 200 MG/2ML IV SOLN
INTRAVENOUS | Status: AC
  Filled 2017-01-06: qty 2

## 2017-01-06 MED ORDER — FENTANYL CITRATE (PF) 100 MCG/2ML IJ SOLN
INTRAMUSCULAR | Status: DC | PRN
  Administered 2017-01-06 (×2): 50 ug via INTRAVENOUS

## 2017-01-06 MED ORDER — MIDAZOLAM HCL 2 MG/2ML IJ SOLN
INTRAMUSCULAR | Status: AC
  Filled 2017-01-06: qty 2

## 2017-01-06 MED ORDER — HYDROCODONE-ACETAMINOPHEN 5-325 MG PO TABS: 1.0000 | ORAL_TABLET | ORAL | 0 refills | Status: DC | PRN

## 2017-01-06 MED ORDER — ONDANSETRON HCL 4 MG PO TABS: 4.0000 mg | ORAL_TABLET | Freq: Three times a day (TID) | ORAL | 0 refills | Status: DC | PRN

## 2017-01-06 MED ORDER — LIDOCAINE HCL (PF) 4 % IJ SOLN
INTRAMUSCULAR | Status: AC
  Filled 2017-01-06: qty 5

## 2017-01-06 MED ORDER — ROCURONIUM BROMIDE 100 MG/10ML IV SOLN
INTRAVENOUS | Status: DC | PRN
  Administered 2017-01-06: 30 mg via INTRAVENOUS
  Administered 2017-01-06: 20 mg via INTRAVENOUS

## 2017-01-06 MED ORDER — SODIUM CHLORIDE 0.9 % IJ SOLN
INTRAMUSCULAR | Status: AC
  Filled 2017-01-06: qty 10

## 2017-01-06 MED ORDER — SODIUM CHLORIDE 0.9 % IV SOLN
INTRAVENOUS | Status: DC
  Administered 2017-01-06: 08:00:00 via INTRAVENOUS

## 2017-01-06 MED ORDER — EPINEPHRINE PF 1 MG/ML IJ SOLN
INTRAMUSCULAR | Status: DC | PRN
  Administered 2017-01-06: 1 mg via ENDOTRACHEOPULMONARY

## 2017-01-06 MED ORDER — DEXAMETHASONE SODIUM PHOSPHATE 10 MG/ML IJ SOLN
INTRAMUSCULAR | Status: DC | PRN
  Administered 2017-01-06: 5 mg via INTRAVENOUS

## 2017-01-06 MED ORDER — PROPOFOL 10 MG/ML IV BOLUS
INTRAVENOUS | Status: DC | PRN
  Administered 2017-01-06: 130 mg via INTRAVENOUS
  Administered 2017-01-06: 20 mg via INTRAVENOUS

## 2017-01-06 MED ORDER — LIDOCAINE HCL (CARDIAC) 20 MG/ML IV SOLN
INTRAVENOUS | Status: DC | PRN
  Administered 2017-01-06: 100 mg via INTRAVENOUS
  Administered 2017-01-06: 50 mg via INTRAVENOUS

## 2017-01-06 MED ORDER — OXYMETAZOLINE HCL 0.05 % NA SOLN
NASAL | Status: AC
  Filled 2017-01-06: qty 15

## 2017-01-06 MED ORDER — ONDANSETRON HCL 4 MG/2ML IJ SOLN
INTRAMUSCULAR | Status: AC
  Filled 2017-01-06: qty 2

## 2017-01-06 MED ORDER — ROCURONIUM BROMIDE 50 MG/5ML IV SOLN
INTRAVENOUS | Status: AC
  Filled 2017-01-06: qty 1

## 2017-01-06 MED ORDER — LIDOCAINE HCL (PF) 4 % IJ SOLN
INTRAMUSCULAR | Status: DC | PRN
  Administered 2017-01-06: 1 mL via INTRADERMAL

## 2017-01-06 MED ORDER — FENTANYL CITRATE (PF) 100 MCG/2ML IJ SOLN
INTRAMUSCULAR | Status: AC
  Filled 2017-01-06: qty 2

## 2017-01-06 MED ORDER — EPINEPHRINE PF 1 MG/ML IJ SOLN
INTRAMUSCULAR | Status: AC
  Filled 2017-01-06: qty 1

## 2017-01-06 MED ORDER — DEXAMETHASONE SODIUM PHOSPHATE 10 MG/ML IJ SOLN
INTRAMUSCULAR | Status: AC
  Filled 2017-01-06: qty 1

## 2017-01-06 MED ORDER — FENTANYL CITRATE (PF) 100 MCG/2ML IJ SOLN: 25.0000 ug | INTRAMUSCULAR | Status: DC | PRN

## 2017-01-06 MED ORDER — OXYCODONE HCL 5 MG PO TABS: 5.0000 mg | ORAL_TABLET | Freq: Once | ORAL | Status: DC | PRN

## 2017-01-06 SURGICAL SUPPLY — 24 items
ATOMIZER TRACHEAL (MISCELLANEOUS) ×3 IMPLANT
BANDAGE EYE OVAL (MISCELLANEOUS) ×3 IMPLANT
BASIN GRAD PLASTIC 32OZ STRL (MISCELLANEOUS) ×3 IMPLANT
CUP MEDICINE 2OZ PLAST GRAD ST (MISCELLANEOUS) ×6 IMPLANT
DRAPE TABLE BACK 80X90 (DRAPES) ×3 IMPLANT
DRSG TELFA 4X3 1S NADH ST (GAUZE/BANDAGES/DRESSINGS) ×3 IMPLANT
GLOVE BIO SURGEON STRL SZ7.5 (GLOVE) ×3 IMPLANT
GOWN STRL REUS W/ TWL LRG LVL3 (GOWN DISPOSABLE) ×2 IMPLANT
GOWN STRL REUS W/TWL LRG LVL3 (GOWN DISPOSABLE) ×6
LABEL OR SOLS (LABEL) ×3 IMPLANT
MARKER SKIN DUAL TIP RULER LAB (MISCELLANEOUS) ×3 IMPLANT
NDL FILTER BLUNT 18X1 1/2 (NEEDLE) ×2 IMPLANT
NDL SAFETY 18GX1.5 (NEEDLE) ×3 IMPLANT
NEEDLE FILTER BLUNT 18X 1/2SAF (NEEDLE) ×4
NEEDLE FILTER BLUNT 18X1 1/2 (NEEDLE) ×2 IMPLANT
PATTIES SURGICAL .5 X.5 (GAUZE/BANDAGES/DRESSINGS) ×3 IMPLANT
SOL ANTI-FOG 6CC FOG-OUT (MISCELLANEOUS) ×1 IMPLANT
SOL FOG-OUT ANTI-FOG 6CC (MISCELLANEOUS) ×2
SPONGE XRAY 4X4 16PLY STRL (MISCELLANEOUS) ×3 IMPLANT
SYR 3ML LL SCALE MARK (SYRINGE) ×6 IMPLANT
SYRINGE 10CC LL (SYRINGE) ×3 IMPLANT
TOWEL OR 17X26 4PK STRL BLUE (TOWEL DISPOSABLE) ×3 IMPLANT
TUBING CONNECTING 10 (TUBING) ×2 IMPLANT
TUBING CONNECTING 10' (TUBING) ×1

## 2017-01-06 NOTE — Anesthesia Preprocedure Evaluation (Signed)
Anesthesia Evaluation  Patient identified by [name], date of birth, ID band Patient awake    Reviewed: Allergy & Precautions, H&P , NPO status , Patient's Chart, lab work & pertinent test results  History of Anesthesia Complications Negative for: history of anesthetic complications  Airway Mallampati: III  TM Distance: <3 FB Neck ROM: limited    Dental  (+) Poor Dentition, Chipped, Missing, Caps   Pulmonary neg shortness of breath, former smoker,           Cardiovascular Exercise Tolerance: Good hypertension, (-) angina+CHF  (-) Past MI and (-) DOE + dysrhythmias + pacemaker + Cardiac Defibrillator      Neuro/Psych PSYCHIATRIC DISORDERS Anxiety Depression negative neurological ROS  negative psych ROS   GI/Hepatic Neg liver ROS, GERD  Medicated and Controlled,  Endo/Other  diabetes, Type 2  Renal/GU CRFRenal disease     Musculoskeletal   Abdominal   Peds  Hematology negative hematology ROS (+)   Anesthesia Other Findings Past Medical History: No date: Anxiety No date: Complete heart block (HCC) No date: Depression No date: Diabetes mellitus, type 2 (HCC) No date: Dual ICD (implantable cardiac defibrillator-[employer]     Comment:  a. 6949 implanted originally s/p 6947 lead & generator               change out 05/2010; b. upgraded to MDT CRT-D in 07/2016 No date: GERD (gastroesophageal reflux disease) No date: Gout No date: History of kidney stones No date: Hypertension No date: IBS (irritable bowel syndrome) No date: Kidney stones No date: Nonischemic cardiomyopathy ([place])     Comment:  a. Echo 07/2012 EF 35-40%, severe HK of the               mid-distalanterior myocardium w/ AK of the distalinferior              myocardium, GR1DD, mild MR, mild to mod dilated LA; b/               echo 06/2016 EF 35-40%, severe HK of the               mid-apicalanteroseptal and anterior myocardium and severe              HK of the  apicalinferior myocardium along with AK of the               apical myocardium, GR1DD, calcified mitral annulus,               mildly dilated left atrium No date: Paroxysmal atrial fibrillation ([place])     Comment:  a. CHADS2VASc => 3 (CHF, HTN, age x 1); b. on ASA only No date: Presence of permanent cardiac pacemaker No date: Syncope  Past Surgical History: 07/21/2016: BIV UPGRADE; N/A     Comment:  Procedure: BiV Upgrade;  Surgeon: Deboraha Sprang, MD;                Location: [facility];  Service: Cardiovascular;                Laterality: N/A; No date: CARDIAC DEFIBRILLATOR PLACEMENT No date: LITHOTRIPSY No date: UPPER ENDOSCOPY W/ ESOPHAGEAL MANOMETRY  BMI    Body Mass Index:  26.42 kg/m      Reproductive/Obstetrics negative OB ROS                            Anesthesia Physical Anesthesia Plan  ASA: III  Anesthesia Plan: General ETT   Post-op Pain Management:    Induction: Intravenous  PONV Risk Score and Plan: 2 and Ondansetron and Dexamethasone  Airway Management Planned: Oral ETT  Additional Equipment:   Intra-op Plan:   Post-operative Plan: Extubation in OR  Informed Consent: I have reviewed the patients History and Physical, chart, labs and discussed the procedure including the risks, benefits and alternatives for the proposed anesthesia with the patient or authorized representative who has indicated his/her understanding and acceptance.   Dental Advisory Given  Plan Discussed with: Anesthesiologist, CRNA and Surgeon  Anesthesia Plan Comments: (Per cardiology, plan to have pacemaker ICD interrogated and reprogrammed pre and post operatively.  Patient consented for risks of anesthesia including but not limited to:  - adverse reactions to medications - damage to teeth, lips or other oral mucosa - sore throat or hoarseness - Damage to heart, brain, lungs or loss of life  Patient voiced understanding.)         Anesthesia Quick Evaluation

## 2017-01-06 NOTE — Op Note (Signed)
....  01/06/2017  9:44 AM    Verl Blalock, Remo Lipps  [id number]   Pre-Op Dx:  Right true vocal fold lesion, dysphonia, distant history of tobacco abuse  Post-op Dx: same  Proc: Suspension Microlaryngoscopy with micro flap excision   Surg:  Brucha Ahlquist  Anes:  GOT  EBL:  25ml  Comp:  none  Findings:  Large exophytic lesion extending along the majority of right anterior 2/3 of true vocal fold.  Broad based and firm attachment.  No involvement of anterior commissure.  Procedure: After the patient was identified in holding and the history and physical and consent was reviewed, the patient was taken to the operating room and placed in a supine position. General endotracheal anesthesia was induced in the normal fashion with a McGraff.   At this time, the patient was rotated 90 degrees and a shoulder roll was placed as well as well as a mouth guard.   At this time,Dedo laryngoscope was inserted into the patient's oral cavity. Visualization of the oral cavity, oropharynx, pharynx and larynx was made.  This demonstrated limited view due to the patient's anterior nature and the scope was exchanged with an anterior commissure laryngoscope. This demonstrated an right vocal fold mass arising from the vibratory edge.  This was friable yet firm in nature and broad based along the right anterior 2/3 of the vocal fold. A magnified zero degree Hopkin's rod was used to evaluate the mass. Topical epinephrine on pledgets were placed against the lesion for approximately one minute. At this time, the laryngoscope was suspended from the [facility] stand and the operating microscope was brought onto the field.   Under high power magnification, a micro cup forceps was used to gently grasp the mass and retract it laterally. A 45 degree micro scissors were used to gently incise the mucosa of the connecting tissue of the vocal fold and the mass. Next straight suction and micro scissors were used to gently dissect the lesion  away from the vocal fold.  Care was taken to avoid injury to the vocal ligament. This mass extending from the posterior aspect of the right true vocal fold to approximately 35mm from the anterior commissure.  Care was taken to avoid trauma to the anterior commissure.  Epi pledgets were placed for hemostasis.   The patient's entire airway was next evaluated for hemostasis and meticulous suctioning was performed. 0.25ccs of 4% lidocaine was sprayed onto the larynx. The patient was released from suspension and the mouth gag and laryngoscope was removed from the patient's oral cavity without injury to teeth, lips, or gums.  Dispo:   To PACU in good condition  Plan:  Soft Diet, follow up in 1 week for repeat evaluation and review of pathology.  Voice rest x 3 days.  Izac Faulkenberry  01/06/2017 9:44 AM

## 2017-01-06 NOTE — Discharge Instructions (Signed)
AMBULATORY SURGERY  DISCHARGE INSTRUCTIONS   1) The drugs that you were given will stay in your system until tomorrow so for the next 24 hours you should not:  A) Drive an automobile B) Make any legal decisions C) Drink any alcoholic beverage   2) You may resume regular meals tomorrow.  Today it is better to start with liquids and gradually work up to solid foods.  You may eat anything you prefer, but it is better to start with liquids, then soup and crackers, and gradually work up to solid foods.   3) Please notify your doctor immediately if you have any unusual bleeding, trouble breathing, redness and pain at the surgery site, drainage, fever, or pain not relieved by medication.    4) Additional Instructions:  Soft Diet until further notice from your provider                                                   No TALKING for 3 DAYS                                                  Use a dry erase board for communication    Please contact your physician with any problems or Same Day Surgery at [phone], Monday through Friday 6 am to 4 pm, or [place] at [facility] number at [phone].

## 2017-01-06 NOTE — Anesthesia Post-op Follow-up Note (Signed)
Anesthesia QCDR form completed.        

## 2017-01-06 NOTE — Anesthesia Postprocedure Evaluation (Signed)
Anesthesia Post Note  Patient: Ryan [name]  Procedure(s) Performed: Procedure(s) (LRB): LARYNGOSCOPY WITH EXCISION OF RIGHT VOCAL CORD LESION (Right)  Patient location during evaluation: PACU Anesthesia Type: General Level of consciousness: awake and alert Pain management: pain level controlled Vital Signs Assessment: post-procedure vital signs reviewed and stable Respiratory status: spontaneous breathing, nonlabored ventilation, respiratory function stable and patient connected to nasal cannula oxygen Cardiovascular status: blood pressure returned to baseline and stable Postop Assessment: no signs of nausea or vomiting Anesthetic complications: no Comments: Pacemaker/ICD was re interrogated post op and re programmed back to the patients original setting from this morning.      Last Vitals:  Vitals:   01/06/17 1102 01/06/17 1116  BP: (!) 127/92 132/69  Pulse:    Resp: 16 16  Temp:    SpO2: 99% 99%    Last Pain:  Vitals:   01/06/17 1116  TempSrc:   PainSc: 0-No pain                 Precious Haws Areona Homer

## 2017-01-06 NOTE — Anesthesia Procedure Notes (Signed)
Procedure Name: Intubation Date/Time: 01/06/2017 9:03 AM Performed by: Darlyne Russian Pre-anesthesia Checklist: Patient identified, Emergency Drugs available, Suction available, Patient being monitored and Timeout performed Patient Re-evaluated:Patient Re-evaluated prior to induction Oxygen Delivery Method: Circle system utilized Preoxygenation: Pre-oxygenation with 100% oxygen Induction Type: IV induction Ventilation: Mask ventilation without difficulty Laryngoscope Size: McGraph and 4 Grade View: Grade I Tube type: MLT Tube size: 6.0 mm Number of attempts: 1 Airway Equipment and Method: Stylet Placement Confirmation: ETT inserted through vocal cords under direct vision,  positive ETCO2 and breath sounds checked- equal and bilateral Secured at: 23 cm Tube secured with: Tape Dental Injury: Teeth and Oropharynx as per pre-operative assessment

## 2017-01-06 NOTE — Transfer of Care (Signed)
Immediate Anesthesia Transfer of Care Note  Patient: Ryan [name]  Procedure(s) Performed: Procedure(s): LARYNGOSCOPY WITH EXCISION OF RIGHT VOCAL CORD LESION (Right)  Patient Location: PACU  Anesthesia Type:General  Level of Consciousness: drowsy and patient cooperative  Airway & Oxygen Therapy: Patient Spontanous Breathing and Patient connected to face mask oxygen  Post-op Assessment: Report given to RN and Post -op Vital signs reviewed and stable  Post vital signs: Reviewed and stable  Last Vitals:  Vitals:   01/06/17 0755 01/06/17 0955  BP: 128/84 139/84  Pulse: 78 80  Resp: 16 19  Temp: (!) 35.6 C 36.5 C  SpO2: 98% 98%    Last Pain:  Vitals:   01/06/17 0955  TempSrc: Temporal  PainSc: 0-No pain         Complications: No apparent anesthesia complications

## 2017-01-06 NOTE — H&P (Signed)
..  History and Physical paper copy reviewed and updated date of procedure and will be scanned into system.  Patient seen and examined.  

## 2017-01-11 LAB — SURGICAL PATHOLOGY

## 2017-01-14 ENCOUNTER — Other Ambulatory Visit: Payer: Self-pay | Admitting: Family Medicine

## 2017-01-27 ENCOUNTER — Other Ambulatory Visit: Payer: Self-pay | Admitting: Family Medicine

## 2017-01-27 DIAGNOSIS — I10 Essential (primary) hypertension: Secondary | ICD-10-CM

## 2017-01-27 DIAGNOSIS — I428 Other cardiomyopathies: Secondary | ICD-10-CM

## 2017-01-27 DIAGNOSIS — I429 Cardiomyopathy, unspecified: Secondary | ICD-10-CM

## 2017-02-01 ENCOUNTER — Telehealth: Payer: Self-pay | Admitting: Family Medicine

## 2017-02-01 ENCOUNTER — Ambulatory Visit (INDEPENDENT_AMBULATORY_CARE_PROVIDER_SITE_OTHER): Payer: Medicare Other | Admitting: *Deleted

## 2017-02-01 DIAGNOSIS — I428 Other cardiomyopathies: Secondary | ICD-10-CM | POA: Diagnosis not present

## 2017-02-01 DIAGNOSIS — I5022 Chronic systolic (congestive) heart failure: Secondary | ICD-10-CM

## 2017-02-01 DIAGNOSIS — I429 Cardiomyopathy, unspecified: Secondary | ICD-10-CM

## 2017-02-01 MED ORDER — AMOXICILLIN-POT CLAVULANATE 875-125 MG PO TABS: 1.0000 | ORAL_TABLET | Freq: Two times a day (BID) | ORAL | 0 refills | Status: DC

## 2017-02-01 NOTE — Telephone Encounter (Signed)
Phone call patient on the road in [place] with sinus infection will call in Augmentin patient education given.

## 2017-02-01 NOTE — Progress Notes (Signed)
Remote ICD transmission.   

## 2017-02-01 NOTE — Telephone Encounter (Signed)
Patient was transferred to provider for telephone conversation.   

## 2017-02-03 ENCOUNTER — Encounter: Payer: Self-pay | Admitting: Cardiology

## 2017-02-03 LAB — CUP PACEART REMOTE DEVICE CHECK
Brady Statistic AP VP Percent: 22.19 %
Brady Statistic AP VS Percent: 0.01 %
Brady Statistic AS VP Percent: 77.8 %
Brady Statistic AS VS Percent: 0 %
Brady Statistic RV Percent Paced: 99.98 %
HIGH POWER IMPEDANCE MEASURED VALUE: 59 Ohm
HighPow Impedance: 49 Ohm
Implantable Lead Implant Date: 20060505
Implantable Lead Implant Date: 20120111
Implantable Lead Location: 753858
Implantable Lead Location: 753859
Implantable Lead Model: 5076
Lead Channel Impedance Value: 209 Ohm
Lead Channel Impedance Value: 222.34 Ohm
Lead Channel Impedance Value: 234.08 Ohm
Lead Channel Impedance Value: 418 Ohm
Lead Channel Impedance Value: 532 Ohm
Lead Channel Impedance Value: 551 Ohm
Lead Channel Impedance Value: 817 Ohm
Lead Channel Impedance Value: 817 Ohm
Lead Channel Impedance Value: 817 Ohm
Lead Channel Impedance Value: 836 Ohm
Lead Channel Pacing Threshold Amplitude: 0.625 V
Lead Channel Pacing Threshold Amplitude: 1.625 V
Lead Channel Pacing Threshold Pulse Width: 0.4 ms
Lead Channel Sensing Intrinsic Amplitude: 0.875 mV
Lead Channel Sensing Intrinsic Amplitude: 0.875 mV
Lead Channel Setting Pacing Amplitude: 1.5 V
Lead Channel Setting Pacing Pulse Width: 0.4 ms
Lead Channel Setting Sensing Sensitivity: 0.3 mV
MDC IDC LEAD IMPLANT DT: 20180314
MDC IDC LEAD LOCATION: 753860
MDC IDC MSMT BATTERY REMAINING LONGEVITY: 101 mo
MDC IDC MSMT BATTERY VOLTAGE: 3.01 V
MDC IDC MSMT LEADCHNL LV IMPEDANCE VALUE: 222.34 Ohm
MDC IDC MSMT LEADCHNL LV IMPEDANCE VALUE: 234.08 Ohm
MDC IDC MSMT LEADCHNL LV IMPEDANCE VALUE: 418 Ohm
MDC IDC MSMT LEADCHNL LV IMPEDANCE VALUE: 475 Ohm
MDC IDC MSMT LEADCHNL LV IMPEDANCE VALUE: 817 Ohm
MDC IDC MSMT LEADCHNL LV IMPEDANCE VALUE: 836 Ohm
MDC IDC MSMT LEADCHNL RA IMPEDANCE VALUE: 418 Ohm
MDC IDC MSMT LEADCHNL RA PACING THRESHOLD PULSEWIDTH: 0.4 ms
MDC IDC MSMT LEADCHNL RV IMPEDANCE VALUE: 608 Ohm
MDC IDC MSMT LEADCHNL RV PACING THRESHOLD AMPLITUDE: 0.625 V
MDC IDC MSMT LEADCHNL RV PACING THRESHOLD PULSEWIDTH: 0.4 ms
MDC IDC PG IMPLANT DT: 20180314
MDC IDC SESS DTM: 20180925082405
MDC IDC SET LEADCHNL LV PACING AMPLITUDE: 2 V
MDC IDC SET LEADCHNL LV PACING PULSEWIDTH: 0.4 ms
MDC IDC SET LEADCHNL RA PACING AMPLITUDE: 1.25 V
MDC IDC STAT BRADY RA PERCENT PACED: 22.16 %

## 2017-02-22 ENCOUNTER — Encounter: Payer: Self-pay | Admitting: Physician Assistant

## 2017-02-22 ENCOUNTER — Ambulatory Visit (INDEPENDENT_AMBULATORY_CARE_PROVIDER_SITE_OTHER): Payer: Medicare Other | Admitting: Physician Assistant

## 2017-02-22 VITALS — BP 120/78 | HR 68 | Ht 73.5 in | Wt 200.2 lb

## 2017-02-22 DIAGNOSIS — I442 Atrioventricular block, complete: Secondary | ICD-10-CM | POA: Diagnosis not present

## 2017-02-22 DIAGNOSIS — I10 Essential (primary) hypertension: Secondary | ICD-10-CM | POA: Diagnosis not present

## 2017-02-22 DIAGNOSIS — I5022 Chronic systolic (congestive) heart failure: Secondary | ICD-10-CM | POA: Diagnosis not present

## 2017-02-22 DIAGNOSIS — I480 Paroxysmal atrial fibrillation: Secondary | ICD-10-CM

## 2017-02-22 DIAGNOSIS — N182 Chronic kidney disease, stage 2 (mild): Secondary | ICD-10-CM | POA: Diagnosis not present

## 2017-02-22 DIAGNOSIS — I428 Other cardiomyopathies: Secondary | ICD-10-CM

## 2017-02-22 NOTE — Progress Notes (Signed)
Cardiology Office Note Date:  02/22/2017  Patient ID:  Ryan [name], DOB 01-23-1951, [name] [id number] PCP:  Guadalupe Maple, MD  Cardiologist:  Dr. Caryl Comes, MD    Chief Complaint: Follow up NICM/HTN  History of Present Illness: Ryan [name] is a 66 y.o. male with history of NICM, complete heart block s/p prior PPM for syncope, s/p generator replacement with a new 6947-lead in 05/2010 s/p MDT CRT-D upgrade in 07/2016 now device dependent, PAF on ASA only, HTN, and hypothryoidism who presents for follow up of NICM/HTN.   Prior Echo in 07/2012 showed EF of 35-40%, severe HK of the mid-distalanterior myocardium with AK of the distalinferior myocardium, GR1DD, mild MR, mild to moderately dilated left atrium. Most recent echo from 06/2016 showed stable EF at 35-40%, severe HK of the mid-apicalanteroseptal and anterior myocardium and severe HK of the apicalinferior myocardium along with AK of the apical myocardium, GR1DD, calcified mitral annulus, mildly dilated left atrium. He was seen by Dr. Caryl Comes on 11/02/2016 and noted more energy and being able to keep up with his colleagues better. BP at home had been in the 140-160 range. He has previously had some lightheadedness and fatigue which seemed to improve with down titration of his Coreg. Given his elevated BP, and in the setting of his cardiomyopathy, he was transitioned from losartan to Entresto 49/51 mg bid with planned possible titration. Labs checked at that time showed a stable SCr at 1.18 with potassium 4.6. TSH was mildly elevated at 5.790 with free T4 and free T3 being normal. He was seen on 7/6 for BP check and doing well with home BP in the 646O to 032Z systolic. Labs on 7/6 showed a mildly elevated SCr of 1.34 (up from 1.18 on 6/26). He was advised to increase fluids. Recheck SCr on 7/10 by PCP was 1.37. Recheck SCr on 7/18 remained 1.37. Case was discussed with Dr. Caryl Comes, MD who recommended to stop Entresto with recheck bmet. Recheck SCr on 8/3 of  1.4, results were routed to Dr. Caryl Comes who now advised restarting Entresto 49/51 mg bid. Recheck bmet on 8/24 showed improved SCr of 1.30.   He is doing well today. Tolerating all medications without issues. BP has been running in the 224M to 250I systolic and 37C to 48G diastolic. Continues to work in his yard/field daily. Has 21 cows and 2 donkeys. Continues to note improved energy since cutting back on Coreg earlier in 2018. No lower extremity swelling, abdominal distension, orthopnea, or cough.     Past Medical History:  Diagnosis Date  . Anxiety   . Complete heart block ([place])   . Depression   . Diabetes mellitus, type 2 ([place])   . Dual ICD (implantable cardiac defibrillator-[employer]tronic    a. [id number] implanted originally s/p [id number] lead & generator change out 05/2010; b. upgraded to MDT CRT-D in 07/2016  . GERD (gastroesophageal reflux disease)   . Gout   . History of kidney stones   . Hypertension   . IBS (irritable bowel syndrome)   . Kidney stones   . Nonischemic cardiomyopathy ([place])    a. Echo 07/2012 EF 35-40%, severe HK of the mid-distalanterior myocardium w/ AK of the distalinferior myocardium, GR1DD, mild MR, mild to mod dilated LA; b/ echo 06/2016 EF 35-40%, severe HK of the mid-apicalanteroseptal and anterior myocardium and severe HK of the apicalinferior myocardium along with AK of the apical myocardium, GR1DD, calcified mitral annulus, mildly dilated left atrium  . Paroxysmal atrial fibrillation ([facility])  a. CHADS2VASc => 3 (CHF, HTN, age x 1); b. on ASA only  . Presence of permanent cardiac pacemaker   . Syncope     Past Surgical History:  Procedure Laterality Date  . BIV UPGRADE N/A 07/21/2016   Procedure: BiV Upgrade;  Surgeon: Deboraha Sprang, MD;  Location: [facility];  Service: Cardiovascular;  Laterality: N/A;  . CARDIAC DEFIBRILLATOR PLACEMENT    . LARYNGOSCOPY Right 01/06/2017   Procedure: LARYNGOSCOPY WITH EXCISION OF RIGHT VOCAL CORD LESION;  Surgeon: Carloyn Manner, MD;  Location: [facility];  Service: ENT;  Laterality: Right;  . LITHOTRIPSY    . UPPER ENDOSCOPY W/ ESOPHAGEAL MANOMETRY      Current Meds  Medication Sig  . carvedilol (COREG) 12.5 MG tablet Take 1 tablet (12.5 mg total) by mouth 2 (two) times daily.  . dapagliflozin propanediol (FARXIGA) 10 MG TABS tablet Take 10 mg by mouth daily.  Marland Kitchen dexlansoprazole (DEXILANT) 60 MG capsule Take 1 capsule (60 mg total) by mouth daily. (Patient taking differently: Take 60 mg by mouth every morning. )  . febuxostat (ULORIC) 40 MG tablet Take 1 tablet (40 mg total) by mouth daily.  Marland Kitchen HYDROcodone-acetaminophen (NORCO) 5-325 MG tablet Take 1 tablet by mouth every 4 (four) hours as needed for moderate pain.  . INVOKANA 100 MG TABS tablet 1 Tablet daily ORAL  . ondansetron (ZOFRAN) 4 MG tablet Take 1 tablet (4 mg total) by mouth every 8 (eight) hours as needed for nausea or vomiting.  . sacubitril-valsartan (ENTRESTO) 24-26 MG Take 1 tablet by mouth 2 (two) times daily.  Marland Kitchen spironolactone (ALDACTONE) 25 MG tablet Take 0.5 tablets (12.5 mg total) by mouth daily.    Allergies:   Ace inhibitors and Crestor [rosuvastatin calcium]   Social History:  The patient  reports that he quit smoking about 27 years ago. His smoking use included Cigarettes. He has never used smokeless tobacco. He reports that he drinks about 4.2 oz of alcohol per week . He reports that he does not use drugs.   Family History:  The patient's family history includes Breast cancer in his mother; Cancer in his father and mother; Diabetes in his father, mother, and sister; Heart disease in his mother; Lung cancer in his father.  ROS:   Review of Systems  Constitutional: Negative for chills, diaphoresis, fever, malaise/fatigue and weight loss.  HENT: Negative for congestion.   Eyes: Negative for discharge and redness.  Respiratory: Negative for cough, hemoptysis, sputum production, shortness of breath and wheezing.   Cardiovascular:  Negative for chest pain, palpitations, orthopnea, claudication, leg swelling and PND.  Gastrointestinal: Negative for abdominal pain, blood in stool, heartburn, melena, nausea and vomiting.  Genitourinary: Negative for hematuria.  Musculoskeletal: Negative for falls and myalgias.  Skin: Negative for rash.  Neurological: Negative for dizziness, tingling, tremors, sensory change, speech change, focal weakness, loss of consciousness and weakness.  Endo/Heme/Allergies: Does not bruise/bleed easily.  Psychiatric/Behavioral: Negative for substance abuse. The patient is not nervous/anxious.      PHYSICAL EXAM:  VS:  BP 120/78 (BP Location: Left Arm, Patient Position: Sitting, Cuff Size: Normal)   Pulse 68   Ht 6' 1.5" (1.867 m)   Wt 200 lb 4 oz (90.8 kg)   BMI 26.06 kg/m  BMI: Body mass index is 26.06 kg/m.  Physical Exam  Constitutional: He is oriented to person, place, and time. He appears well-developed and well-nourished.  HENT:  Head: Normocephalic and atraumatic.  Eyes: Right eye exhibits no  discharge. Left eye exhibits no discharge.  Neck: Normal range of motion. No JVD present.  Cardiovascular: Normal rate, regular rhythm, S1 normal, S2 normal and normal heart sounds.  Exam reveals no distant heart sounds, no friction rub, no midsystolic click and no opening snap.   No murmur heard. Pulses:      Posterior tibial pulses are 2+ on the right side, and 2+ on the left side.  Pulmonary/Chest: Effort normal and breath sounds normal. No respiratory distress. He has no decreased breath sounds. He has no wheezes. He has no rales. He exhibits no tenderness.  Abdominal: Soft. He exhibits no distension. There is no tenderness.  Musculoskeletal: He exhibits no edema.  Neurological: He is alert and oriented to person, place, and time.  Skin: Skin is warm and dry. No cyanosis. Nails show no clubbing.  Psychiatric: He has a normal mood and affect. His speech is normal and behavior is normal.  Judgment and thought content normal.    EKG:  Was ordered and interpreted by me today. Shows A-sensed, V-paced, 68 bpm  Recent Labs: 11/02/2016: TSH 5.790 11/16/2016: ALT (SGPT) Piccolo, Waived 54 12/31/2016: BUN 17; Creatinine, Ser 1.30; Potassium 4.0; Sodium 140 01/05/2017: Hemoglobin 14.9; Platelets 168  04/14/2016: Chol/HDL Ratio 4.5; HDL 33; LDL Calculated 68 11/16/2016: Cholesterol Piccolo, Waived 145; Triglycerides Piccolo,Waived 299; VLDL Chol Calc Piccolo,Waive 60   CrCl cannot be calculated (Patient's most recent lab result is older than the maximum 21 days allowed.).   Wt Readings from Last 3 Encounters:  02/22/17 200 lb 4 oz (90.8 kg)  01/06/17 203 lb (92.1 kg)  01/05/17 203 lb 14.4 oz (92.5 kg)     Other studies reviewed: Additional studies/records reviewed today include: summarized above  ASSESSMENT AND PLAN:  1. NICM/chronic systolic CHF: He does not appear volume up at this time. He is doing quite well. Continue Entresto 24/26 mg bid, spironolactone 12.5 mg daily and Coreg 12.5 mg bid. Continue low sodium diet and monitoring PO fluids. Daily weights. Call for weight gain > 2 pounds overnight or 5 pounds in a week. Recheck echo in 06/2017 to evaluate for improvement in EF.   2. HTN: BP well controlled today. Continue current medication regimen.   3. CKD stage II-III: Check bmet today.   4. PAF: Paced rhythm. Follow up with primary cardiologist.    5. Complete heart block: Status post MDR CRT-D. Followed by Dr. Caryl Comes, MD.  6. Vocal cord lesion: Underwent successful laryngoscopy 8/30 with improvement in voice. 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 Patient notes possible repeat biopsy in the next several weeks. He would be moderate risk for non-cardiac procedure given his prior EF.   Disposition: F/u with Dr. Caryl Comes or myself in 4 months.   Current medicines are reviewed at length with the patient today.  The patient did not have any concerns  regarding medicines.  Melvern Banker PA-C 02/22/2017 2:04 PM     [address] [place], [place] [address] [phone]

## 2017-02-22 NOTE — Patient Instructions (Addendum)
Medication Instructions:  Your physician recommends that you continue on your current medications as directed. Please refer to the Current Medication list given to you today.   Labwork: BMET today  Testing/Procedures: Your physician has requested that you have an echocardiogram in February 2019. Echocardiography is a painless test that uses sound waves to create images of your heart. It provides your doctor with information about the size and shape of your heart and how well your heart's chambers and valves are working. This procedure takes approximately one hour. There are no restrictions for this procedure.    Follow-Up: Your physician wants you to follow-up in: February 2019 with Dr. Caryl Comes or Christell Faith, PA-C. You will receive a reminder letter in the mail two months in advance. If you don't receive a letter, please call our office to schedule the follow-up appointment.   Any Other Special Instructions Will Be Listed Below (If Applicable).     If you need a refill on your cardiac medications before your next appointment, please call your pharmacy.  Echocardiogram An echocardiogram, or echocardiography, uses sound waves (ultrasound) to produce an image of your heart. The echocardiogram is simple, painless, obtained within a short period of time, and offers valuable information to your health care provider. The images from an echocardiogram can provide information such as:  Evidence of coronary artery disease (CAD).  Heart size.  Heart muscle function.  Heart valve function.  Aneurysm detection.  Evidence of a past heart attack.  Fluid buildup around the heart.  Heart muscle thickening.  Assess heart valve function.  Tell a health care provider about:  Any allergies you have.  All medicines you are taking, including vitamins, herbs, eye drops, creams, and over-the-counter medicines.  Any problems you or family members have had with anesthetic medicines.  Any blood  disorders you have.  Any surgeries you have had.  Any medical conditions you have.  Whether you are pregnant or may be pregnant. What happens before the procedure? No special preparation is needed. Eat and drink normally. What happens during the procedure?  In order to produce an image of your heart, gel will be applied to your chest and a wand-like tool (transducer) will be moved over your chest. The gel will help transmit the sound waves from the transducer. The sound waves will harmlessly bounce off your heart to allow the heart images to be captured in real-time motion. These images will then be recorded.  You may need an IV to receive a medicine that improves the quality of the pictures. What happens after the procedure? You may return to your normal schedule including diet, activities, and medicines, unless your health care provider tells you otherwise. This information is not intended to replace advice given to you by your health care provider. Make sure you discuss any questions you have with your health care provider. Document Released: 04/23/2000 Document Revised: 12/13/2015 Document Reviewed: 01/01/2013 Elsevier Interactive Patient Education  2017 [employer].

## 2017-02-23 LAB — BASIC METABOLIC PANEL
BUN/Creatinine Ratio: 13 (ref 10–24)
BUN: 14 mg/dL (ref 8–27)
CALCIUM: 9.6 mg/dL (ref 8.6–10.2)
CHLORIDE: 101 mmol/L (ref 96–106)
CO2: 22 mmol/L (ref 20–29)
Creatinine, Ser: 1.09 mg/dL (ref 0.76–1.27)
GFR calc non Af Amer: 70 mL/min/{1.73_m2} (ref >59)
GFR, EST AFRICAN AMERICAN: 81 mL/min/{1.73_m2} (ref >59)
Glucose: 159 mg/dL — ABNORMAL HIGH (ref 65–99)
POTASSIUM: 4.4 mmol/L (ref 3.5–5.2)
SODIUM: 140 mmol/L (ref 134–144)

## 2017-02-25 DIAGNOSIS — R490 Dysphonia: Secondary | ICD-10-CM | POA: Diagnosis not present

## 2017-02-25 DIAGNOSIS — D141 Benign neoplasm of larynx: Secondary | ICD-10-CM | POA: Diagnosis not present

## 2017-04-08 ENCOUNTER — Telehealth: Payer: Self-pay | Admitting: Cardiology

## 2017-04-08 NOTE — Telephone Encounter (Signed)
Patient called and stated that he was talking to his neighbor and he had his IPAD on his shoulder for 4-5 mins and he heard an alert tone from his ICD. Instructed him to send a remote transmission w/ his home monitor. Transmission received and Device Tech RN reviewed. I informed pt that the reason he heard the alert tone is b/c he had the IPAD close to his ICD and the magnets inside the device caused the alert tone. Informed him to be mindful of this in the future. Pt verbalized understanding.

## 2017-04-18 ENCOUNTER — Ambulatory Visit: Payer: Medicare Other

## 2017-04-25 ENCOUNTER — Other Ambulatory Visit: Payer: Self-pay

## 2017-04-25 ENCOUNTER — Ambulatory Visit (INDEPENDENT_AMBULATORY_CARE_PROVIDER_SITE_OTHER): Payer: Medicare Other

## 2017-04-25 ENCOUNTER — Other Ambulatory Visit: Payer: Self-pay | Admitting: Family Medicine

## 2017-04-25 VITALS — BP 142/80 | HR 78 | Temp 97.5°F | Resp 17 | Ht 73.0 in | Wt 202.0 lb

## 2017-04-25 DIAGNOSIS — Z794 Long term (current) use of insulin: Principal | ICD-10-CM

## 2017-04-25 DIAGNOSIS — Z1159 Encounter for screening for other viral diseases: Secondary | ICD-10-CM | POA: Diagnosis not present

## 2017-04-25 DIAGNOSIS — E785 Hyperlipidemia, unspecified: Secondary | ICD-10-CM | POA: Diagnosis not present

## 2017-04-25 DIAGNOSIS — I10 Essential (primary) hypertension: Secondary | ICD-10-CM | POA: Diagnosis not present

## 2017-04-25 DIAGNOSIS — Z0000 Encounter for general adult medical examination without abnormal findings: Secondary | ICD-10-CM | POA: Diagnosis not present

## 2017-04-25 DIAGNOSIS — N400 Enlarged prostate without lower urinary tract symptoms: Secondary | ICD-10-CM

## 2017-04-25 DIAGNOSIS — E119 Type 2 diabetes mellitus without complications: Secondary | ICD-10-CM | POA: Diagnosis not present

## 2017-04-25 DIAGNOSIS — Z1211 Encounter for screening for malignant neoplasm of colon: Secondary | ICD-10-CM

## 2017-04-25 LAB — URINALYSIS, ROUTINE W REFLEX MICROSCOPIC
Bilirubin, UA: NEGATIVE
Ketones, UA: NEGATIVE
LEUKOCYTES UA: NEGATIVE
Nitrite, UA: NEGATIVE
PH UA: 5 (ref 5.0–7.5)
PROTEIN UA: NEGATIVE
RBC, UA: NEGATIVE
Specific Gravity, UA: 1.015 (ref 1.005–1.030)
Urobilinogen, Ur: 0.2 mg/dL (ref 0.2–1.0)

## 2017-04-25 NOTE — Addendum Note (Signed)
Addended by: Merrie Roof E on: 04/25/2017 01:59 PM   Modules accepted: Orders

## 2017-04-25 NOTE — Patient Instructions (Signed)
Ryan [name] , Thank you for taking time to come for your Medicare Wellness Visit. I appreciate your ongoing commitment to your health goals. Please review the following plan we discussed and let me know if I can assist you in the future.   Screening recommendations/referrals: Colonoscopy: due now- cologuard ordered today.  Recommended yearly ophthalmology/optometry visit for glaucoma screening and checkup Recommended yearly dental visit for hygiene and checkup  Vaccinations: Influenza vaccine: due now- declined Pneumococcal vaccine: prevnar 13 due- declined Tdap vaccine: up to date Shingles vaccine: up to date  Advanced directives: Please bring a copy of your health care power of attorney and living will to the office at your convenience.  Conditions/risks identified: none  Next appointment: Follow up on 04/27/2017 at 9:00am with [name]. Follow up in one year for your annual wellness exam.   Preventive Care 65 Years and Older, Male Preventive care refers to lifestyle choices and visits with your health care provider that can promote health and wellness. What does preventive care include?  A yearly physical exam. This is also called an annual well check.  Dental exams once or twice a year.  Routine eye exams. Ask your health care provider how often you should have your eyes checked.  Personal lifestyle choices, including:  Daily care of your teeth and gums.  Regular physical activity.  Eating a healthy diet.  Avoiding tobacco and drug use.  Limiting alcohol use.  Practicing safe sex.  Taking low doses of aspirin every day.  Taking vitamin and mineral supplements as recommended by your health care provider. What happens during an annual well check? The services and screenings done by your health care provider during your annual well check will depend on your age, overall health, lifestyle risk factors, and family history of disease. Counseling  Your health care  provider may ask you questions about your:  Alcohol use.  Tobacco use.  Drug use.  Emotional well-being.  Home and relationship well-being.  Sexual activity.  Eating habits.  History of falls.  Memory and ability to understand (cognition).  Work and work Statistician. Screening  You may have the following tests or measurements:  Height, weight, and BMI.  Blood pressure.  Lipid and cholesterol levels. These may be checked every 5 years, or more frequently if you are over 25 years old.  Skin check.  Lung cancer screening. You may have this screening every year starting at age 57 if you have a 30-pack-year history of smoking and currently smoke or have quit within the past 15 years.  Fecal occult blood test (FOBT) of the stool. You may have this test every year starting at age 7.  Flexible sigmoidoscopy or colonoscopy. You may have a sigmoidoscopy every 5 years or a colonoscopy every 10 years starting at age 13.  Prostate cancer screening. Recommendations will vary depending on your family history and other risks.  Hepatitis C blood test.  Hepatitis B blood test.  Sexually transmitted disease (STD) testing.  Diabetes screening. This is done by checking your blood sugar (glucose) after you have not eaten for a while (fasting). You may have this done every 1-3 years.  Abdominal aortic aneurysm (AAA) screening. You may need this if you are a current or former smoker.  Osteoporosis. You may be screened starting at age 72 if you are at high risk. Talk with your health care provider about your test results, treatment options, and if necessary, the need for more tests. Vaccines  Your health care provider may  recommend certain vaccines, such as:  Influenza vaccine. This is recommended every year.  Tetanus, diphtheria, and acellular pertussis (Tdap, Td) vaccine. You may need a Td booster every 10 years.  Zoster vaccine. You may need this after age 33.  Pneumococcal  13-valent conjugate (PCV13) vaccine. One dose is recommended after age 33.  Pneumococcal polysaccharide (PPSV23) vaccine. One dose is recommended after age 72. Talk to your health care provider about which screenings and vaccines you need and how often you need them. This information is not intended to replace advice given to you by your health care provider. Make sure you discuss any questions you have with your health care provider. Document Released: 05/23/2015 Document Revised: 01/14/2016 Document Reviewed: 02/25/2015 Elsevier Interactive Patient Education  2017 [place] Prevention in the Home Falls can cause injuries. They can happen to people of all ages. There are many things you can do to make your home safe and to help prevent falls. What can I do on the outside of my home?  Regularly fix the edges of walkways and driveways and fix any cracks.  Remove anything that might make you trip as you walk through a door, such as a raised step or threshold.  Trim any bushes or trees on the path to your home.  Use bright outdoor lighting.  Clear any walking paths of anything that might make someone trip, such as rocks or tools.  Regularly check to see if handrails are loose or broken. Make sure that both sides of any steps have handrails.  Any raised decks and porches should have guardrails on the edges.  Have any leaves, snow, or ice cleared regularly.  Use sand or salt on walking paths during winter.  Clean up any spills in your garage right away. This includes oil or grease spills. What can I do in the bathroom?  Use night lights.  Install grab bars by the toilet and in the tub and shower. Do not use towel bars as grab bars.  Use non-skid mats or decals in the tub or shower.  If you need to sit down in the shower, use a plastic, non-slip stool.  Keep the floor dry. Clean up any water that spills on the floor as soon as it happens.  Remove soap buildup in the  tub or shower regularly.  Attach bath mats securely with double-sided non-slip rug tape.  Do not have throw rugs and other things on the floor that can make you trip. What can I do in the bedroom?  Use night lights.  Make sure that you have a light by your bed that is easy to reach.  Do not use any sheets or blankets that are too big for your bed. They should not hang down onto the floor.  Have a firm chair that has side arms. You can use this for support while you get dressed.  Do not have throw rugs and other things on the floor that can make you trip. What can I do in the kitchen?  Clean up any spills right away.  Avoid walking on wet floors.  Keep items that you use a lot in easy-to-reach places.  If you need to reach something above you, use a strong step stool that has a grab bar.  Keep electrical cords out of the way.  Do not use floor polish or wax that makes floors slippery. If you must use wax, use non-skid floor wax.  Do not have throw rugs and  other things on the floor that can make you trip. What can I do with my stairs?  Do not leave any items on the stairs.  Make sure that there are handrails on both sides of the stairs and use them. Fix handrails that are broken or loose. Make sure that handrails are as long as the stairways.  Check any carpeting to make sure that it is firmly attached to the stairs. Fix any carpet that is loose or worn.  Avoid having throw rugs at the top or bottom of the stairs. If you do have throw rugs, attach them to the floor with carpet tape.  Make sure that you have a light switch at the top of the stairs and the bottom of the stairs. If you do not have them, ask someone to add them for you. What else can I do to help prevent falls?  Wear shoes that:  Do not have high heels.  Have rubber bottoms.  Are comfortable and fit you well.  Are closed at the toe. Do not wear sandals.  If you use a stepladder:  Make sure that it  is fully opened. Do not climb a closed stepladder.  Make sure that both sides of the stepladder are locked into place.  Ask someone to hold it for you, if possible.  Clearly mark and make sure that you can see:  Any grab bars or handrails.  First and last steps.  Where the edge of each step is.  Use tools that help you move around (mobility aids) if they are needed. These include:  Canes.  Walkers.  Scooters.  Crutches.  Turn on the lights when you go into a dark area. Replace any light bulbs as soon as they burn out.  Set up your furniture so you have a clear path. Avoid moving your furniture around.  If any of your floors are uneven, fix them.  If there are any pets around you, be aware of where they are.  Review your medicines with your doctor. Some medicines can make you feel dizzy. This can increase your chance of falling. Ask your doctor what other things that you can do to help prevent falls. This information is not intended to replace advice given to you by your health care provider. Make sure you discuss any questions you have with your health care provider. Document Released: 02/20/2009 Document Revised: 10/02/2015 Document Reviewed: 05/31/2014 Elsevier Interactive Patient Education  2017 [employer].

## 2017-04-25 NOTE — Progress Notes (Signed)
Subjective:   Ryan [name] is a 66 y.o. male who presents for Medicare Annual/Subsequent preventive examination.  Review of Systems:   Cardiac Risk Factors include: hypertension;male gender;advanced age (>14men, >54 women);diabetes mellitus;dyslipidemia     Objective:    Vitals: BP (!) 142/80 (BP Location: Left Arm, Patient Position: Sitting)   Pulse 78   Temp (!) 97.5 F (36.4 C) (Oral)   Resp 17   Ht 6\' 1"  (1.854 m)   Wt 202 lb (91.6 kg)   BMI 26.65 kg/m   Body mass index is 26.65 kg/m.  Advanced Directives 07/21/2016 04/14/2016  Does Patient Have a Medical Advance Directive? Yes Yes  Type of Paramedic of [place];Living will Living will  Does patient want to make changes to medical advance directive? No - Patient declined No - Patient declined  Copy of [place] in Chart? No - copy requested -    Tobacco Social History   Tobacco Use  Smoking Status Former Smoker  . Types: Cigarettes  . Last attempt to quit: 12/30/1989  . Years since quitting: 27.3  Smokeless Tobacco Never Used     Counseling given: No   Clinical Intake:  Pre-visit preparation completed: Yes  Pain : No/denies pain     Nutritional Status: BMI 25 -29 Overweight Nutritional Risks: None Diabetes: Yes CBG done?: No Did pt. bring in CBG monitor from home?: No  How often do you need to have someone help you when you read instructions, pamphlets, or other written materials from your doctor or pharmacy?: 1 - Never What is the last grade level you completed in school?: 12th grade, industrial schools  Interpreter Needed?: No  Information entered by :: Jamis Kryder,LPN   Past Medical History:  Diagnosis Date  . Anxiety   . Complete heart block ([place])   . Depression   . Diabetes mellitus, type 2 ([place])   . Dual ICD (implantable cardiac defibrillator-Medtronic    a. [id number] implanted originally s/p [phone] lead & generator change out 05/2010; b. upgraded to  MDT CRT-D in 07/2016  . GERD (gastroesophageal reflux disease)   . Gout   . History of kidney stones   . Hypertension   . IBS (irritable bowel syndrome)   . Kidney stones   . Nonischemic cardiomyopathy ([place])    a. Echo 07/2012 EF 35-40%, severe HK of the mid-distalanterior myocardium w/ AK of the distalinferior myocardium, GR1DD, mild MR, mild to mod dilated LA; b/ echo 06/2016 EF 35-40%, severe HK of the mid-apicalanteroseptal and anterior myocardium and severe HK of the apicalinferior myocardium along with AK of the apical myocardium, GR1DD, calcified mitral annulus, mildly dilated left atrium  . Paroxysmal atrial fibrillation ([place])    a. CHADS2VASc => 3 (CHF, HTN, age x 1); b. on ASA only  . Presence of permanent cardiac pacemaker   . Syncope    Past Surgical History:  Procedure Laterality Date  . BIV UPGRADE N/A 07/21/2016   Procedure: BiV Upgrade;  Surgeon: Deboraha Sprang, MD;  Location: [place] [facility];  Service: Cardiovascular;  Laterality: N/A;  . CARDIAC DEFIBRILLATOR PLACEMENT    . LARYNGOSCOPY Right 01/06/2017   Procedure: LARYNGOSCOPY WITH EXCISION OF RIGHT VOCAL CORD LESION;  Surgeon: Carloyn Manner, MD;  Location: [facility];  Service: ENT;  Laterality: Right;  . LITHOTRIPSY    . UPPER ENDOSCOPY W/ ESOPHAGEAL MANOMETRY     Family History  Problem Relation Age of Onset  . Breast cancer Mother   .  Diabetes Mother   . Heart disease Mother   . Cancer Mother        breast  . Lung cancer Father   . Diabetes Father   . Cancer Father        lung  . Diabetes Sister    Social History   Socioeconomic History  . Marital status: Married    Spouse name: None  . Number of children: 1  . Years of education: 16  . Highest education level: 12th grade  Social Needs  . Financial resource strain: Not hard at all  . Food insecurity - worry: Never true  . Food insecurity - inability: Never true  . Transportation needs - medical: No  . Transportation needs - non-medical: No    Occupational History  . Occupation: Self Employed    Employer: [employer]  Tobacco Use  . Smoking status: Former Smoker    Types: Cigarettes    Last attempt to quit: 12/30/1989    Years since quitting: 27.3  . Smokeless tobacco: Never Used  Substance and Sexual Activity  . Alcohol use: Yes    Alcohol/week: 4.2 oz    Types: 7 Cans of beer per week  . Drug use: No  . Sexual activity: Yes  Other Topics Concern  . None  Social History Narrative  . None    Outpatient Encounter Medications as of 04/25/2017  Medication Sig  . carvedilol (COREG) 12.5 MG tablet Take 1 tablet (12.5 mg total) by mouth 2 (two) times daily.  Marland Kitchen dexlansoprazole (DEXILANT) 60 MG capsule Take 1 capsule (60 mg total) by mouth daily. (Patient taking differently: Take 60 mg by mouth every morning. )  . febuxostat (ULORIC) 40 MG tablet Take 1 tablet (40 mg total) by mouth daily.  . INVOKANA 100 MG TABS tablet 1 Tablet daily ORAL  . ranitidine (ZANTAC) 300 MG tablet   . sacubitril-valsartan (ENTRESTO) 24-26 MG Take 1 tablet by mouth 2 (two) times daily.  Marland Kitchen spironolactone (ALDACTONE) 25 MG tablet Take 0.5 tablets (12.5 mg total) by mouth daily.  . [DISCONTINUED] dapagliflozin propanediol (FARXIGA) 10 MG TABS tablet Take 10 mg by mouth daily. (Patient not taking: Reported on 04/25/2017)  . [DISCONTINUED] HYDROcodone-acetaminophen (NORCO) 5-325 MG tablet Take 1 tablet by mouth every 4 (four) hours as needed for moderate pain. (Patient not taking: Reported on 04/25/2017)  . [DISCONTINUED] ondansetron (ZOFRAN) 4 MG tablet Take 1 tablet (4 mg total) by mouth every 8 (eight) hours as needed for nausea or vomiting. (Patient not taking: Reported on 04/25/2017)   No facility-administered encounter medications on file as of 04/25/2017.     Activities of Daily Living In your present state of health, do you have any difficulty performing the following activities: 04/25/2017 01/05/2017  Hearing? N -  Vision? N -   Difficulty concentrating or making decisions? N -  Walking or climbing stairs? N N  Dressing or bathing? N -  Doing errands, shopping? N N  Preparing Food and eating ? N -  Using the Toilet? N -  In the past six months, have you accidently leaked urine? N -  Do you have problems with loss of bowel control? N -  Managing your Medications? N -  Managing your Finances? N -  Housekeeping or managing your Housekeeping? N -  Some recent data might be hidden    Patient Care Team: Guadalupe Maple, MD as PCP - General (Unknown Physician Specialty) Carloyn Manner, MD as Referring Physician (Otolaryngology) Caryl Comes,  Revonda Standard, MD as Consulting Physician (Cardiology)   Assessment:   This is a routine wellness examination for [place].  Exercise Activities and Dietary recommendations Current Exercise Habits: Home exercise routine, Type of exercise: walking, Time (Minutes): 30, Frequency (Times/Week): 7, Weekly Exercise (Minutes/Week): 210, Intensity: Mild, Exercise limited by: None identified  Goals    None      Fall Risk Fall Risk  04/25/2017 11/16/2016 04/14/2016 10/28/2015  Falls in the past year? No No No No   Is the patient's home free of loose throw rugs in walkways, pet beds, electrical cords, etc?   yes      Grab bars in the bathroom? yes      Handrails on the stairs?   yes      Adequate lighting?   yes  Timed Get Up and Go Performed: completed in 6 seconds with no use of assistive devices, steady gait. No intervention needed at this time.   Depression Screen PHQ 2/9 Scores 04/25/2017 11/16/2016 04/14/2016 10/28/2015  PHQ - 2 Score 0 0 0 0  PHQ- 9 Score - - 0 -    Cognitive Function     6CIT Screen 04/25/2017  What Year? 0 points  What month? 0 points  What time? 0 points  Count back from 20 0 points  Months in reverse 0 points  Repeat phrase 0 points  Total Score 0    Immunization History  Administered Date(s) Administered  . Pneumococcal Polysaccharide-23  04/21/2005  . Td 06/10/1996, 05/20/2010  . Zoster 11/04/2011    Qualifies for Shingles Vaccine? Completed   Screening Tests Health Maintenance  Topic Date Due  . COLONOSCOPY  10/21/2000  . OPHTHALMOLOGY EXAM  05/10/2012  . FOOT EXAM  06/16/2016  . INFLUENZA VACCINE  12/26/2017 (Originally 12/08/2016)  . PNA vac Low Risk Adult (1 of 2 - PCV13) 04/25/2018 (Originally 10/22/2015)  . HEMOGLOBIN A1C  05/19/2017  . URINE MICROALBUMIN  04/25/2018  . TETANUS/TDAP  05/20/2020  . Hepatitis C Screening  Completed   Cancer Screenings: Lung: Low Dose CT Chest recommended if Age 62-80 years, 30 pack-year currently smoking OR have quit w/in 15years. Patient does not qualify. Colorectal: cologuard  Additional Screenings: Hepatitis B/HIV/Syphillis: not indicated Hepatitis C Screening: completed 04/25/2017    Plan:    I have personally reviewed and addressed the Medicare Annual Wellness questionnaire and have noted the following in the patient's chart:  A. Medical and social history B. Use of alcohol, tobacco or illicit drugs  C. Current medications and supplements D. Functional ability and status E.  Nutritional status F.  Physical activity G. Advance directives H. List of other physicians I.  Hospitalizations, surgeries, and ER visits in previous 12 months J.  [place] such as hearing and vision if needed, cognitive and depression L. Referrals and appointments   In addition, I have reviewed and discussed with patient certain preventive protocols, quality metrics, and best practice recommendations. A written personalized care plan for preventive services as well as general preventive health recommendations were provided to patient.   Signed,  Tyler Aas, LPN Nurse Health Advisor   Nurse Notes:due for diabetic foot exam. Discussed diabetic eye exam with patient, he will follow up with Dr.Woodard.  Ordered Cologuard today

## 2017-04-26 LAB — CBC WITH DIFFERENTIAL/PLATELET
BASOS ABS: 0.1 10*3/uL (ref 0.0–0.2)
Basos: 1 %
EOS (ABSOLUTE): 0.2 10*3/uL (ref 0.0–0.4)
Eos: 3 %
HEMOGLOBIN: 16.5 g/dL (ref 13.0–17.7)
Hematocrit: 49.3 % (ref 37.5–51.0)
IMMATURE GRANS (ABS): 0 10*3/uL (ref 0.0–0.1)
IMMATURE GRANULOCYTES: 0 %
LYMPHS: 30 %
Lymphocytes Absolute: 2 10*3/uL (ref 0.7–3.1)
MCH: 30.7 pg (ref 26.6–33.0)
MCHC: 33.5 g/dL (ref 31.5–35.7)
MCV: 92 fL (ref 79–97)
MONOCYTES: 8 %
Monocytes Absolute: 0.5 10*3/uL (ref 0.1–0.9)
NEUTROS ABS: 3.9 10*3/uL (ref 1.4–7.0)
NEUTROS PCT: 58 %
Platelets: 182 10*3/uL (ref 150–379)
RBC: 5.37 x10E6/uL (ref 4.14–5.80)
RDW: 14 % (ref 12.3–15.4)
WBC: 6.7 10*3/uL (ref 3.4–10.8)

## 2017-04-26 LAB — COMPREHENSIVE METABOLIC PANEL
ALBUMIN: 4.9 g/dL — AB (ref 3.6–4.8)
ALT: 40 IU/L (ref 0–44)
AST: 27 IU/L (ref 0–40)
Albumin/Globulin Ratio: 1.9 (ref 1.2–2.2)
Alkaline Phosphatase: 54 IU/L (ref 39–117)
BUN / CREAT RATIO: 10 (ref 10–24)
BUN: 14 mg/dL (ref 8–27)
Bilirubin Total: 0.7 mg/dL (ref 0.0–1.2)
CALCIUM: 10 mg/dL (ref 8.6–10.2)
CO2: 23 mmol/L (ref 20–29)
CREATININE: 1.34 mg/dL — AB (ref 0.76–1.27)
Chloride: 101 mmol/L (ref 96–106)
GFR calc non Af Amer: 55 mL/min/{1.73_m2} — ABNORMAL LOW (ref >59)
GFR, EST AFRICAN AMERICAN: 63 mL/min/{1.73_m2} (ref >59)
GLUCOSE: 189 mg/dL — AB (ref 65–99)
Globulin, Total: 2.6 g/dL (ref 1.5–4.5)
Potassium: 4.6 mmol/L (ref 3.5–5.2)
Sodium: 140 mmol/L (ref 134–144)
TOTAL PROTEIN: 7.5 g/dL (ref 6.0–8.5)

## 2017-04-26 LAB — BAYER DCA HB A1C WAIVED: HB A1C: 7 % — AB (ref <7.0)

## 2017-04-26 LAB — PSA: Prostate Specific Ag, Serum: 1.1 ng/mL (ref 0.0–4.0)

## 2017-04-26 LAB — HEPATITIS C ANTIBODY

## 2017-04-26 LAB — TSH: TSH: 5.01 u[IU]/mL — ABNORMAL HIGH (ref 0.450–4.500)

## 2017-04-27 ENCOUNTER — Encounter: Payer: Self-pay | Admitting: Family Medicine

## 2017-04-27 ENCOUNTER — Ambulatory Visit (INDEPENDENT_AMBULATORY_CARE_PROVIDER_SITE_OTHER): Payer: Medicare Other | Admitting: Family Medicine

## 2017-04-27 VITALS — BP 128/77 | HR 80 | Wt 202.0 lb

## 2017-04-27 DIAGNOSIS — Z7189 Other specified counseling: Secondary | ICD-10-CM

## 2017-04-27 DIAGNOSIS — I509 Heart failure, unspecified: Secondary | ICD-10-CM | POA: Diagnosis not present

## 2017-04-27 DIAGNOSIS — M1000 Idiopathic gout, unspecified site: Secondary | ICD-10-CM | POA: Diagnosis not present

## 2017-04-27 DIAGNOSIS — I428 Other cardiomyopathies: Secondary | ICD-10-CM

## 2017-04-27 DIAGNOSIS — N400 Enlarged prostate without lower urinary tract symptoms: Secondary | ICD-10-CM | POA: Diagnosis not present

## 2017-04-27 DIAGNOSIS — Z794 Long term (current) use of insulin: Secondary | ICD-10-CM | POA: Diagnosis not present

## 2017-04-27 DIAGNOSIS — E119 Type 2 diabetes mellitus without complications: Secondary | ICD-10-CM | POA: Diagnosis not present

## 2017-04-27 DIAGNOSIS — E7800 Pure hypercholesterolemia, unspecified: Secondary | ICD-10-CM

## 2017-04-27 DIAGNOSIS — I429 Cardiomyopathy, unspecified: Secondary | ICD-10-CM

## 2017-04-27 DIAGNOSIS — I10 Essential (primary) hypertension: Secondary | ICD-10-CM | POA: Diagnosis not present

## 2017-04-27 DIAGNOSIS — Z0000 Encounter for general adult medical examination without abnormal findings: Secondary | ICD-10-CM

## 2017-04-27 NOTE — Progress Notes (Signed)
BP 128/77   Pulse 80   Wt 202 lb (91.6 kg)   SpO2 98%   BMI 26.65 kg/m    Subjective:    Patient ID: Ryan [name], male    DOB: Sep 11, 1950, 66 y.o.   [name]: [id number]  HPI: Ryan [name] is a 66 y.o. male  Chief Complaint  Patient presents with  . Annual Exam    Saw Tiffany  patient all in all doing well reviewed medications and taking without problems or issues. Heart failure stable. Diabetes also stable without problems reflux doing well as his blood pressure. Great deal of stress with having gotten over heart surgery and throat surgery. Reviewed stress energy patient called for jury duty in [place] to not do jury duty because of the stress on top of stress on top of stress for safety of service.  Relevant past medical, surgical, family and social history reviewed and updated as indicated. Interim medical history since our last visit reviewed. Allergies and medications reviewed and updated.  Review of Systems  Constitutional: Negative.   HENT: Negative.   Eyes: Negative.   Respiratory: Negative.   Cardiovascular: Negative.   Gastrointestinal: Negative.   Endocrine: Negative.   Genitourinary: Negative.   Musculoskeletal: Negative.   Skin: Negative.   Allergic/Immunologic: Negative.   Neurological: Negative.   Hematological: Negative.   Psychiatric/Behavioral: Negative.     Per HPI unless specifically indicated above     Objective:    BP 128/77   Pulse 80   Wt 202 lb (91.6 kg)   SpO2 98%   BMI 26.65 kg/m   Wt Readings from Last 3 Encounters:  04/27/17 202 lb (91.6 kg)  04/25/17 202 lb (91.6 kg)  02/22/17 200 lb 4 oz (90.8 kg)    Physical Exam  Constitutional: He is oriented to person, place, and time. He appears well-developed and well-nourished.  HENT:  Head: Normocephalic and atraumatic.  Right Ear: External ear normal.  Left Ear: External ear normal.  Eyes: Conjunctivae and EOM are normal. Pupils are equal, round, and reactive to light.    Neck: Normal range of motion. Neck supple.  Cardiovascular: Normal rate, regular rhythm, normal heart sounds and intact distal pulses.  Pulmonary/Chest: Effort normal and breath sounds normal.  Abdominal: Soft. Bowel sounds are normal. There is no splenomegaly or hepatomegaly.  Genitourinary: Rectum normal, prostate normal and penis normal.  Musculoskeletal: Normal range of motion.  Neurological: He is alert and oriented to person, place, and time. He has normal reflexes.  Skin: No rash noted. No erythema.  Psychiatric: He has a normal mood and affect. His behavior is normal. Judgment and thought content normal.    Results for orders placed or performed in visit on 04/25/17  Hepatitis C antibody screen  Result Value Ref Range   Hep C Virus Ab <0.1 0.0 - 0.9 s/co ratio  Urinalysis, Routine w reflex microscopic  Result Value Ref Range   Specific Gravity, UA 1.015 1.005 - 1.030   pH, UA 5.0 5.0 - 7.5   Color, UA Yellow Yellow   Appearance Ur Clear Clear   Leukocytes, UA Negative Negative   Protein, UA Negative Negative/Trace   Glucose, UA Trace (A) Negative   Ketones, UA Negative Negative   RBC, UA Negative Negative   Bilirubin, UA Negative Negative   Urobilinogen, Ur 0.2 0.2 - 1.0 mg/dL   Nitrite, UA Negative Negative  CBC with Differential  Result Value Ref Range   WBC 6.7 3.4 - 10.8 x10E3/uL  RBC 5.37 4.14 - 5.80 x10E6/uL   Hemoglobin 16.5 13.0 - 17.7 g/dL   Hematocrit 49.3 37.5 - 51.0 %   MCV 92 79 - 97 fL   MCH 30.7 26.6 - 33.0 pg   MCHC 33.5 31.5 - 35.7 g/dL   RDW 14.0 12.3 - 15.4 %   Platelets 182 150 - 379 x10E3/uL   Neutrophils 58 Not Estab. %   Lymphs 30 Not Estab. %   Monocytes 8 Not Estab. %   Eos 3 Not Estab. %   Basos 1 Not Estab. %   Neutrophils Absolute 3.9 1.4 - 7.0 x10E3/uL   Lymphocytes Absolute 2.0 0.7 - 3.1 x10E3/uL   Monocytes Absolute 0.5 0.1 - 0.9 x10E3/uL   EOS (ABSOLUTE) 0.2 0.0 - 0.4 x10E3/uL   Basophils Absolute 0.1 0.0 - 0.2 x10E3/uL    Immature Granulocytes 0 Not Estab. %   Immature Grans (Abs) 0.0 0.0 - 0.1 x10E3/uL  Comp Met (CMET)  Result Value Ref Range   Glucose 189 (H) 65 - 99 mg/dL   BUN 14 8 - 27 mg/dL   Creatinine, Ser 1.34 (H) 0.76 - 1.27 mg/dL   GFR calc non Af Amer 55 (L) >59 mL/min/1.73   GFR calc Af Amer 63 >59 mL/min/1.73   BUN/Creatinine Ratio 10 10 - 24   Sodium 140 134 - 144 mmol/L   Potassium 4.6 3.5 - 5.2 mmol/L   Chloride 101 96 - 106 mmol/L   CO2 23 20 - 29 mmol/L   Calcium 10.0 8.6 - 10.2 mg/dL   Total Protein 7.5 6.0 - 8.5 g/dL   Albumin 4.9 (H) 3.6 - 4.8 g/dL   Globulin, Total 2.6 1.5 - 4.5 g/dL   Albumin/Globulin Ratio 1.9 1.2 - 2.2   Bilirubin Total 0.7 0.0 - 1.2 mg/dL   Alkaline Phosphatase 54 39 - 117 IU/L   AST 27 0 - 40 IU/L   ALT 40 0 - 44 IU/L  TSH  Result Value Ref Range   TSH 5.010 (H) 0.450 - 4.500 uIU/mL  PSA  Result Value Ref Range   Prostate Specific Ag, Serum 1.1 0.0 - 4.0 ng/mL  Bayer DCA Hb A1c Waived  Result Value Ref Range   Bayer DCA Hb A1c Waived 7.0 (H) <7.0 %      Assessment & Plan:   Problem List Items Addressed This Visit      Cardiovascular and Mediastinum   Idiopathic cardiomyopathy ([place])    Stable followed by cardiology      Hypertension    The current medical regimen is effective;  continue present plan and medications.       CHF (congestive heart failure) (HCC)    The current medical regimen is effective;  continue present plan and medications.         Endocrine   Diabetes mellitus ([place])    The current medical regimen is effective;  continue present plan and medications.         Genitourinary   BPH (benign prostatic hyperplasia)    Stable         Other   Hyperlipidemia    The current medical regimen is effective;  continue present plan and medications.       Gout    The current medical regimen is effective;  continue present plan and medications.       Advanced care planning/counseling discussion    Other Visit  Diagnoses    PE (physical exam), annual    -  Primary  Follow up plan: Return in about 6 months (around 10/26/2017) for Hemoglobin A1c, BMP,  Lipids, ALT, AST.

## 2017-04-27 NOTE — Assessment & Plan Note (Signed)
The current medical regimen is effective;  continue present plan and medications.  

## 2017-04-27 NOTE — Assessment & Plan Note (Signed)
Stable

## 2017-04-27 NOTE — Assessment & Plan Note (Signed)
Stable followed by cardiology

## 2017-05-05 ENCOUNTER — Ambulatory Visit (INDEPENDENT_AMBULATORY_CARE_PROVIDER_SITE_OTHER): Payer: Medicare Other | Admitting: *Deleted

## 2017-05-05 DIAGNOSIS — I5022 Chronic systolic (congestive) heart failure: Secondary | ICD-10-CM

## 2017-05-05 DIAGNOSIS — I428 Other cardiomyopathies: Secondary | ICD-10-CM | POA: Diagnosis not present

## 2017-05-05 NOTE — Progress Notes (Signed)
Remote ICD transmission.   

## 2017-05-06 ENCOUNTER — Encounter: Payer: Self-pay | Admitting: Cardiology

## 2017-05-06 LAB — CUP PACEART REMOTE DEVICE CHECK
Battery Remaining Longevity: 100 mo
Battery Voltage: 3 V
Brady Statistic AS VP Percent: 90.19 %
HighPow Impedance: 46 Ohm
HighPow Impedance: 57 Ohm
Implantable Lead Implant Date: 20060505
Implantable Lead Implant Date: 20180314
Implantable Lead Location: 753860
Implantable Lead Model: 5076
Implantable Pulse Generator Implant Date: 20180314
Lead Channel Impedance Value: 224.438
Lead Channel Impedance Value: 399 Ohm
Lead Channel Impedance Value: 418 Ohm
Lead Channel Impedance Value: 513 Ohm
Lead Channel Impedance Value: 551 Ohm
Lead Channel Impedance Value: 589 Ohm
Lead Channel Pacing Threshold Amplitude: 0.625 V
Lead Channel Pacing Threshold Pulse Width: 0.4 ms
Lead Channel Pacing Threshold Pulse Width: 0.4 ms
Lead Channel Setting Pacing Amplitude: 2 V
Lead Channel Setting Pacing Pulse Width: 0.4 ms
Lead Channel Setting Sensing Sensitivity: 0.3 mV
MDC IDC LEAD IMPLANT DT: 20120111
MDC IDC LEAD LOCATION: 753858
MDC IDC LEAD LOCATION: 753859
MDC IDC MSMT LEADCHNL LV IMPEDANCE VALUE: 1007 Ohm
MDC IDC MSMT LEADCHNL LV IMPEDANCE VALUE: 231.42 Ohm
MDC IDC MSMT LEADCHNL LV IMPEDANCE VALUE: 237.865
MDC IDC MSMT LEADCHNL LV IMPEDANCE VALUE: 265.661
MDC IDC MSMT LEADCHNL LV IMPEDANCE VALUE: 284.683
MDC IDC MSMT LEADCHNL LV IMPEDANCE VALUE: 817 Ohm
MDC IDC MSMT LEADCHNL LV IMPEDANCE VALUE: 874 Ohm
MDC IDC MSMT LEADCHNL LV IMPEDANCE VALUE: 893 Ohm
MDC IDC MSMT LEADCHNL LV IMPEDANCE VALUE: 893 Ohm
MDC IDC MSMT LEADCHNL LV IMPEDANCE VALUE: 893 Ohm
MDC IDC MSMT LEADCHNL LV PACING THRESHOLD AMPLITUDE: 1.125 V
MDC IDC MSMT LEADCHNL RA PACING THRESHOLD AMPLITUDE: 0.5 V
MDC IDC MSMT LEADCHNL RA PACING THRESHOLD PULSEWIDTH: 0.4 ms
MDC IDC MSMT LEADCHNL RA SENSING INTR AMPL: 1.375 mV
MDC IDC MSMT LEADCHNL RA SENSING INTR AMPL: 1.375 mV
MDC IDC MSMT LEADCHNL RV IMPEDANCE VALUE: 589 Ohm
MDC IDC MSMT LEADCHNL RV IMPEDANCE VALUE: 665 Ohm
MDC IDC SESS DTM: 20181227093825
MDC IDC SET LEADCHNL RA PACING AMPLITUDE: 1 V
MDC IDC SET LEADCHNL RV PACING AMPLITUDE: 1.5 V
MDC IDC SET LEADCHNL RV PACING PULSEWIDTH: 0.4 ms
MDC IDC STAT BRADY AP VP PERCENT: 9.8 %
MDC IDC STAT BRADY AP VS PERCENT: 0.01 %
MDC IDC STAT BRADY AS VS PERCENT: 0 %
MDC IDC STAT BRADY RA PERCENT PACED: 9.81 %
MDC IDC STAT BRADY RV PERCENT PACED: 99.98 %

## 2017-05-06 NOTE — Progress Notes (Signed)
Letter  

## 2017-05-12 DIAGNOSIS — E119 Type 2 diabetes mellitus without complications: Secondary | ICD-10-CM | POA: Diagnosis not present

## 2017-05-12 LAB — HM DIABETES EYE EXAM

## 2017-06-10 DIAGNOSIS — D141 Benign neoplasm of larynx: Secondary | ICD-10-CM | POA: Diagnosis not present

## 2017-06-10 DIAGNOSIS — R490 Dysphonia: Secondary | ICD-10-CM | POA: Diagnosis not present

## 2017-06-27 ENCOUNTER — Other Ambulatory Visit: Payer: Self-pay | Admitting: Internal Medicine

## 2017-06-27 ENCOUNTER — Other Ambulatory Visit: Payer: Self-pay | Admitting: Family Medicine

## 2017-06-27 DIAGNOSIS — M1000 Idiopathic gout, unspecified site: Secondary | ICD-10-CM

## 2017-07-05 ENCOUNTER — Other Ambulatory Visit: Payer: Self-pay

## 2017-07-05 ENCOUNTER — Other Ambulatory Visit: Payer: Self-pay | Admitting: Family Medicine

## 2017-07-05 ENCOUNTER — Encounter
Admission: RE | Admit: 2017-07-05 | Discharge: 2017-07-05 | Disposition: A | Discharge status: Home / Self Care | Payer: Medicare Other | Source: Ambulatory Visit | Attending: Otolaryngology | Admitting: Otolaryngology

## 2017-07-05 ENCOUNTER — Telehealth: Payer: Self-pay | Admitting: Internal Medicine

## 2017-07-05 HISTORY — DX: Presence of automatic (implantable) cardiac defibrillator: Z95.810

## 2017-07-05 NOTE — Telephone Encounter (Signed)
° °  [facility] Pre-operative Risk Assessment    Request for surgical clearance:  1. What type of surgery is being performed? *Microdirect laryngoscopy with excision right vocal cord lesion    2. When is this surgery scheduled? 07-14-17  3. What type of clearance is required (medical clearance vs. Pharmacy clearance to hold med vs. Both)?preop rx for implanted device   Are there any medications that need to be held prior to surgery and how long? Unknown  4. Practice name and name of physician performing surgery? [facility] Dr. Pryor Ochoa   5. What is your office phone and fax number?  [phone] fax   6. Anesthesia type (None, local, MAC, general) ? Unknown    Clarisse Gouge 07/05/2017, 1:01 PM  _________________________________________________________________   (provider comments below)

## 2017-07-05 NOTE — Telephone Encounter (Signed)
Pulled faxed form for Dr. Caryl Comes to review when he returns on 07/07/17.

## 2017-07-05 NOTE — Patient Instructions (Signed)
Your procedure is scheduled on: Thursday, July 14, 2017 Report to Same Day Surgery on the 2nd floor in the [facility]. To find out your arrival time, please call [phone] between 1PM - 3PM on: Wednesday, July 13, 2017  REMEMBER: Instructions that are not followed completely may result in serious medical risk, up to and including death; or upon the discretion of your surgeon and anesthesiologist your surgery may need to be rescheduled.  Do not eat food after midnight the night before your procedure.  No gum chewing or hard candies.  You may however, drink water up to 2 hours before you are scheduled to arrive at the hospital for your procedure.  Do not drink water within 2 hours of the start of your surgery.  No Alcohol for 24 hours before or after surgery.  No Smoking including e-cigarettes for 24 hours prior to surgery. No chewable tobacco products for at least 6 hours prior to surgery. No nicotine patches on the day of surgery.  On the morning of surgery brush your teeth with toothpaste and water, you may rinse your mouth with mouthwash if you wish. Do not swallow any  toothpaste of mouthwash.  Notify your doctor if there is any change in your medical condition (cold, fever, infection).  Do not wear jewelry, make-up, hairpins, clips or nail polish.  Do not wear lotions, powders, or perfumes. You may wear deodorant.  Do not shave 48 hours prior to surgery. Men may shave face and neck.  Contacts and dentures may not be worn into surgery.  Do not bring valuables to the hospital. [facility] is not responsible for any belongings or valuables.  TAKE THESE MEDICATIONS THE MORNING OF SURGERY:  1.  Carvedilol 2.  Dexilant   ON February 28:  Stop Anti-inflammatories such as Advil, Aleve, Ibuprofen, Motrin, Naproxen, Naprosyn, Goodie powder, or aspirin products. (May take Tylenol or Acetaminophen if needed.)  ON February 28:  Stop ANY OVER THE COUNTER supplements until after  surgery.   If you are being discharged the day of surgery, you will not be allowed to drive home. You will need someone to drive you home and stay with you that night.   If you are taking public transportation, you will need to have a responsible adult to with you.  Please call the number above if you have any questions about these instructions.

## 2017-07-07 NOTE — Pre-Procedure Instructions (Signed)
Ryan [name] IN OR NOTIFIED [employer] WILL BE NEEDED DAY OF SURGERY PER DR S KLEIN/.

## 2017-07-07 NOTE — Telephone Encounter (Signed)
Form completed by Dr. Caryl Comes and faxed to Dr. Pryor Ochoa at [phone]- confirmation received.

## 2017-07-07 NOTE — Anesthesia Pain Management Evaluation Note (Signed)
DONNA IN OR NOTIFIED [employer] NEEDED DAY OF SURGERY AS NOTED ON CARDIAC DEVICE MONITORING PROGRAMMING SHEET RECEIVED BACK FROM DR Olin Pia

## 2017-07-11 ENCOUNTER — Telehealth: Payer: Self-pay

## 2017-07-11 NOTE — Telephone Encounter (Signed)
P.A. For invokana 100 mg was initiated via covermymeds Key: [id number]

## 2017-07-12 NOTE — Telephone Encounter (Signed)
P.a. Approved from 04/12/17-07/11/2018. Approval scanned in media.

## 2017-07-14 ENCOUNTER — Ambulatory Visit: Payer: Medicare Other | Admitting: Anesthesiology

## 2017-07-14 ENCOUNTER — Encounter
Admission: RE | Disposition: A | Discharge status: Home / Self Care | Payer: Self-pay | Source: Ambulatory Visit | Attending: Otolaryngology

## 2017-07-14 ENCOUNTER — Other Ambulatory Visit: Payer: Self-pay

## 2017-07-14 ENCOUNTER — Encounter: Payer: Self-pay | Admitting: *Deleted

## 2017-07-14 ENCOUNTER — Ambulatory Visit
Admission: RE | Admit: 2017-07-14 | Discharge: 2017-07-14 | Disposition: A | Discharge status: Home / Self Care | Payer: Medicare Other | Source: Ambulatory Visit | Attending: Otolaryngology | Admitting: Otolaryngology

## 2017-07-14 DIAGNOSIS — I509 Heart failure, unspecified: Secondary | ICD-10-CM | POA: Insufficient documentation

## 2017-07-14 DIAGNOSIS — F329 Major depressive disorder, single episode, unspecified: Secondary | ICD-10-CM | POA: Insufficient documentation

## 2017-07-14 DIAGNOSIS — F419 Anxiety disorder, unspecified: Secondary | ICD-10-CM | POA: Insufficient documentation

## 2017-07-14 DIAGNOSIS — D380 Neoplasm of uncertain behavior of larynx: Secondary | ICD-10-CM | POA: Diagnosis not present

## 2017-07-14 DIAGNOSIS — J383 Other diseases of vocal cords: Secondary | ICD-10-CM | POA: Insufficient documentation

## 2017-07-14 DIAGNOSIS — Z79899 Other long term (current) drug therapy: Secondary | ICD-10-CM | POA: Diagnosis not present

## 2017-07-14 DIAGNOSIS — E119 Type 2 diabetes mellitus without complications: Secondary | ICD-10-CM | POA: Diagnosis not present

## 2017-07-14 DIAGNOSIS — E785 Hyperlipidemia, unspecified: Secondary | ICD-10-CM | POA: Diagnosis not present

## 2017-07-14 DIAGNOSIS — J387 Other diseases of larynx: Secondary | ICD-10-CM | POA: Diagnosis not present

## 2017-07-14 DIAGNOSIS — I480 Paroxysmal atrial fibrillation: Secondary | ICD-10-CM | POA: Diagnosis not present

## 2017-07-14 DIAGNOSIS — I5022 Chronic systolic (congestive) heart failure: Secondary | ICD-10-CM | POA: Diagnosis not present

## 2017-07-14 DIAGNOSIS — I110 Hypertensive heart disease with heart failure: Secondary | ICD-10-CM | POA: Insufficient documentation

## 2017-07-14 DIAGNOSIS — F418 Other specified anxiety disorders: Secondary | ICD-10-CM | POA: Diagnosis not present

## 2017-07-14 DIAGNOSIS — I130 Hypertensive heart and chronic kidney disease with heart failure and stage 1 through stage 4 chronic kidney disease, or unspecified chronic kidney disease: Secondary | ICD-10-CM | POA: Diagnosis not present

## 2017-07-14 DIAGNOSIS — Z87891 Personal history of nicotine dependence: Secondary | ICD-10-CM | POA: Diagnosis not present

## 2017-07-14 DIAGNOSIS — C320 Malignant neoplasm of glottis: Secondary | ICD-10-CM | POA: Diagnosis not present

## 2017-07-14 DIAGNOSIS — K219 Gastro-esophageal reflux disease without esophagitis: Secondary | ICD-10-CM | POA: Diagnosis not present

## 2017-07-14 DIAGNOSIS — E1122 Type 2 diabetes mellitus with diabetic chronic kidney disease: Secondary | ICD-10-CM | POA: Diagnosis not present

## 2017-07-14 DIAGNOSIS — N182 Chronic kidney disease, stage 2 (mild): Secondary | ICD-10-CM | POA: Diagnosis not present

## 2017-07-14 HISTORY — PX: DIRECT LARYNGOSCOPY: SHX5326

## 2017-07-14 LAB — GLUCOSE, CAPILLARY
Glucose-Capillary: 199 mg/dL — ABNORMAL HIGH (ref 65–99)
Glucose-Capillary: 158 mg/dL — ABNORMAL HIGH (ref 65–99)

## 2017-07-14 SURGERY — LARYNGOSCOPY, DIRECT
Anesthesia: General | Laterality: Right

## 2017-07-14 MED ORDER — ROCURONIUM BROMIDE 100 MG/10ML IV SOLN
INTRAVENOUS | Status: DC | PRN
  Administered 2017-07-14: 2 mg via INTRAVENOUS

## 2017-07-14 MED ORDER — SUCCINYLCHOLINE CHLORIDE 20 MG/ML IJ SOLN
INTRAMUSCULAR | Status: AC
  Filled 2017-07-14: qty 1

## 2017-07-14 MED ORDER — LIDOCAINE HCL (CARDIAC) 20 MG/ML IV SOLN
INTRAVENOUS | Status: DC | PRN
  Administered 2017-07-14: 20 mg via INTRAVENOUS

## 2017-07-14 MED ORDER — ONDANSETRON HCL 4 MG PO TABS: 4.0000 mg | ORAL_TABLET | Freq: Three times a day (TID) | ORAL | 0 refills | Status: DC | PRN

## 2017-07-14 MED ORDER — LIDOCAINE HCL (PF) 4 % IJ SOLN
INTRAMUSCULAR | Status: DC | PRN
  Administered 2017-07-14: 160 mg via INTRADERMAL

## 2017-07-14 MED ORDER — ONDANSETRON HCL 4 MG/2ML IJ SOLN: 4.0000 mg | Freq: Once | INTRAMUSCULAR | Status: DC | PRN

## 2017-07-14 MED ORDER — PHENYLEPHRINE HCL 0.5 % NA SOLN
NASAL | Status: DC | PRN
  Administered 2017-07-14: 1 [drp] via NASAL

## 2017-07-14 MED ORDER — FENTANYL CITRATE (PF) 100 MCG/2ML IJ SOLN
INTRAMUSCULAR | Status: DC | PRN
  Administered 2017-07-14: 50 ug via INTRAVENOUS

## 2017-07-14 MED ORDER — FENTANYL CITRATE (PF) 100 MCG/2ML IJ SOLN
INTRAMUSCULAR | Status: AC
  Filled 2017-07-14: qty 2

## 2017-07-14 MED ORDER — PROPOFOL 10 MG/ML IV BOLUS
INTRAVENOUS | Status: DC | PRN
  Administered 2017-07-14: 150 mg via INTRAVENOUS

## 2017-07-14 MED ORDER — DEXAMETHASONE SODIUM PHOSPHATE 10 MG/ML IJ SOLN
INTRAMUSCULAR | Status: AC
  Filled 2017-07-14: qty 1

## 2017-07-14 MED ORDER — OXYMETAZOLINE HCL 0.05 % NA SOLN
NASAL | Status: AC
  Filled 2017-07-14: qty 15

## 2017-07-14 MED ORDER — FENTANYL CITRATE (PF) 100 MCG/2ML IJ SOLN: 25.0000 ug | INTRAMUSCULAR | Status: DC | PRN

## 2017-07-14 MED ORDER — SUCCINYLCHOLINE CHLORIDE 20 MG/ML IJ SOLN
INTRAMUSCULAR | Status: DC | PRN
  Administered 2017-07-14 (×2): 10 mg via INTRAVENOUS
  Administered 2017-07-14: 160 mg via INTRAVENOUS

## 2017-07-14 MED ORDER — ROCURONIUM BROMIDE 50 MG/5ML IV SOLN
INTRAVENOUS | Status: AC
  Filled 2017-07-14: qty 1

## 2017-07-14 MED ORDER — DEXAMETHASONE SODIUM PHOSPHATE 10 MG/ML IJ SOLN
INTRAMUSCULAR | Status: DC | PRN
  Administered 2017-07-14: 10 mg via INTRAVENOUS

## 2017-07-14 MED ORDER — LIDOCAINE HCL (PF) 2 % IJ SOLN
INTRAMUSCULAR | Status: AC
Start: 2017-07-14 — End: 2017-07-14
  Filled 2017-07-14: qty 10

## 2017-07-14 MED ORDER — ONDANSETRON HCL 4 MG/2ML IJ SOLN
INTRAMUSCULAR | Status: DC | PRN
  Administered 2017-07-14: 4 mg via INTRAVENOUS

## 2017-07-14 MED ORDER — EPINEPHRINE PF 1 MG/ML IJ SOLN
INTRAMUSCULAR | Status: DC | PRN
  Administered 2017-07-14: 1 mg via ENDOTRACHEOPULMONARY

## 2017-07-14 MED ORDER — MIDAZOLAM HCL 2 MG/2ML IJ SOLN
INTRAMUSCULAR | Status: DC | PRN
  Administered 2017-07-14: 2 mg via INTRAVENOUS

## 2017-07-14 MED ORDER — EPINEPHRINE PF 1 MG/ML IJ SOLN
INTRAMUSCULAR | Status: AC
  Filled 2017-07-14: qty 1

## 2017-07-14 MED ORDER — MIDAZOLAM HCL 2 MG/2ML IJ SOLN
INTRAMUSCULAR | Status: AC
  Filled 2017-07-14: qty 2

## 2017-07-14 MED ORDER — LACTATED RINGERS IV SOLN
INTRAVENOUS | Status: DC | PRN
  Administered 2017-07-14: 10:00:00 via INTRAVENOUS

## 2017-07-14 MED ORDER — PROPOFOL 10 MG/ML IV BOLUS
INTRAVENOUS | Status: AC
  Filled 2017-07-14: qty 20

## 2017-07-14 MED ORDER — ONDANSETRON HCL 4 MG/2ML IJ SOLN
INTRAMUSCULAR | Status: AC
  Filled 2017-07-14: qty 2

## 2017-07-14 SURGICAL SUPPLY — 26 items
ATOMIZER TRACHEAL (MISCELLANEOUS) ×3 IMPLANT
BANDAGE EYE OVAL (MISCELLANEOUS) ×6 IMPLANT
BASIN GRAD PLASTIC 32OZ STRL (MISCELLANEOUS) ×3 IMPLANT
CANISTER SUCT 1200ML W/VALVE (MISCELLANEOUS) ×3 IMPLANT
CUP MEDICINE 2OZ PLAST GRAD ST (MISCELLANEOUS) ×3 IMPLANT
DEPRESSOR TONGUE BLADE STERILE (MISCELLANEOUS) ×3 IMPLANT
DRAPE TABLE BACK 80X90 (DRAPES) ×3 IMPLANT
DRSG TELFA 4X3 1S NADH ST (GAUZE/BANDAGES/DRESSINGS) ×3 IMPLANT
ELECT E-Z MONOPOLAR 33 (MISCELLANEOUS) ×3
ELECTRODE E-Z MONOPOLAR 33 (MISCELLANEOUS) ×1 IMPLANT
FILTER LAP SMOKE EVAC STRL (MISCELLANEOUS) ×3 IMPLANT
GAUZE SPONGE 4X4 12PLY STRL (GAUZE/BANDAGES/DRESSINGS) ×3 IMPLANT
GLOVE BIO SURGEON STRL SZ7.5 (GLOVE) ×3 IMPLANT
GOWN STRL REUS W/ TWL LRG LVL4 (GOWN DISPOSABLE) ×1 IMPLANT
GOWN STRL REUS W/TWL LRG LVL4 (GOWN DISPOSABLE) ×3
LABEL OR SOLS (LABEL) ×3 IMPLANT
NDL SAFETY ECLIPSE 18X1.5 (NEEDLE) ×1 IMPLANT
NEEDLE HYPO 18GX1.5 SHARP (NEEDLE) ×3
PATTIES SURGICAL .5X1.5 (GAUZE/BANDAGES/DRESSINGS) ×3 IMPLANT
PENCIL ELECTRO HAND CTR (MISCELLANEOUS) ×3 IMPLANT
SOL ANTI-FOG 6CC FOG-OUT (MISCELLANEOUS) ×1 IMPLANT
SOL FOG-OUT ANTI-FOG 6CC (MISCELLANEOUS) ×2
TOWEL OR 17X26 4PK STRL BLUE (TOWEL DISPOSABLE) ×3 IMPLANT
TUBING CONNECTING 10 (TUBING) ×2 IMPLANT
TUBING CONNECTING 10' (TUBING) ×1
WATER STERILE IRR 1000ML POUR (IV SOLUTION) ×3 IMPLANT

## 2017-07-14 NOTE — Anesthesia Preprocedure Evaluation (Addendum)
Anesthesia Evaluation  Patient identified by [name], date of birth, ID band Patient awake    Reviewed: Allergy & Precautions, H&P , NPO status , Patient's Chart, lab work & pertinent test results, reviewed documented beta blocker date and time   History of Anesthesia Complications Negative for: history of anesthetic complications  Airway Mallampati: III  TM Distance: <3 FB Neck ROM: limited    Dental  (+) Poor Dentition, Chipped, Missing, Caps   Pulmonary neg shortness of breath, former smoker,           Cardiovascular Exercise Tolerance: Good hypertension, Pt. on home beta blockers and Pt. on medications (-) angina+CHF  (-) Past MI and (-) DOE + dysrhythmias + pacemaker + Cardiac Defibrillator      Neuro/Psych PSYCHIATRIC DISORDERS Anxiety Depression negative neurological ROS  negative psych ROS   GI/Hepatic Neg liver ROS, GERD  Medicated and Controlled,  Endo/Other  diabetes, Type 2  Renal/GU CRFRenal disease     Musculoskeletal   Abdominal   Peds  Hematology negative hematology ROS (+)   Anesthesia Other Findings Past Medical History: No date: Anxiety No date: Complete heart block (HCC) No date: Depression No date: Diabetes mellitus, type 2 (HCC) No date: Dual ICD (implantable cardiac defibrillator-Medtronic     Comment:  a. [id number] implanted originally s/p 6947 lead & generator               change out 05/2010; b. upgraded to MDT CRT-D in 07/2016 No date: GERD (gastroesophageal reflux disease) No date: Gout No date: History of kidney stones No date: Hypertension No date: IBS (irritable bowel syndrome) No date: Kidney stones No date: Nonischemic cardiomyopathy ([place])     Comment:  a. Echo 07/2012 EF 35-40%, severe HK of the               mid-distalanterior myocardium w/ AK of the distalinferior              myocardium, GR1DD, mild MR, mild to mod dilated LA; b/               echo 06/2016 EF 35-40%, severe HK of  the               mid-apicalanteroseptal and anterior myocardium and severe              HK of the apicalinferior myocardium along with AK of the               apical myocardium, GR1DD, calcified mitral annulus,               mildly dilated left atrium No date: Paroxysmal atrial fibrillation ([place])     Comment:  a. CHADS2VASc => 3 (CHF, HTN, age x 1); b. on ASA only No date: Presence of permanent cardiac pacemaker No date: Syncope  Past Surgical History: 07/21/2016: BIV UPGRADE; N/A     Comment:  Procedure: BiV Upgrade;  Surgeon: Deboraha Sprang, MD;                Location: [place] [facility];  Service: Cardiovascular;                Laterality: N/A; No date: CARDIAC DEFIBRILLATOR PLACEMENT No date: LITHOTRIPSY No date: UPPER ENDOSCOPY W/ ESOPHAGEAL MANOMETRY  BMI    Body Mass Index:  26.42 kg/m      Reproductive/Obstetrics negative OB ROS  Anesthesia Physical  Anesthesia Plan  ASA: III  Anesthesia Plan: General ETT   Post-op Pain Management:    Induction: Intravenous  PONV Risk Score and Plan: 2 and Ondansetron and Dexamethasone  Airway Management Planned: Oral ETT  Additional Equipment:   Intra-op Plan:   Post-operative Plan: Extubation in OR  Informed Consent: I have reviewed the patients History and Physical, chart, labs and discussed the procedure including the risks, benefits and alternatives for the proposed anesthesia with the patient or authorized representative who has indicated his/her understanding and acceptance.   Dental Advisory Given  Plan Discussed with: Anesthesiologist, CRNA and Surgeon  Anesthesia Plan Comments: (Per cardiology, plan to have pacemaker ICD interrogated and reprogrammed pre and post operatively.  Patient consented for risks of anesthesia including but not limited to:  - adverse reactions to medications - damage to teeth, lips or other oral mucosa - sore throat or hoarseness -  Damage to heart, brain, lungs or loss of life  Patient voiced understanding.)        Anesthesia Quick Evaluation

## 2017-07-14 NOTE — Op Note (Signed)
....  07/14/2017  10:24 AM    Verl Blalock, Remo Lipps  [id number]   Pre-Op Dx:  Dysphonia, history of dysplasia  Post-op Dx: same  Proc: Suspension Microlaryngoscopy with biopsy of right vocal fold lesion   Surg:  Bri Wakeman  Anes:  GOT  EBL: <49ml  Comp:  none  Findings:  Right true vocal fold irregular margin with scar.  Biopsies taken.   Procedure: After the patient was identified in holding and the history and physical and consent was reviewed, the patient was taken to the operating room and placed in a supine position. General endotracheal anesthesia was induced in the normal fashion.    At this time, the patient was rotated 90 degrees and a shoulder roll was placed as well as well as a mouth guard.   At this time, an Anterior commisure laryngoscope was inserted into the patient's oral cavity. Visualization of the oral cavity, oropharynx, pharynx and larynx was made. A magnified zero degree Hopkin's rod was used.  This demonstrated a slightly irregular margin of the right true vocal fold in the area of the previous large verrucous mass that was removed previously. Topical epinephrine on pledgets were placed against the lesion for approximately one minute. At this time, the laryngoscope was suspended from the [facility] stand and the operating microscope was brought onto the field.   Under high power magnification, a straight micro cup forceps was used to gently remove the irregularity along the vibratory margin of the right mid and anterior vocal fold. Epi pledgets were placed for hemostasis.  No larger or deeper mass was noted.  The patient's entire airway was next evaluated for hemostasis and meticulous suctioning was done.  The patient was released from suspension and the mouth gag and laryngoscope was removed from the patient's oral cavity without injury to teeth, lips, or gums.    Dispo:   To PACU in good condition  Plan:  Soft Diet, voice rest for 3 days.  Follow up for review of  pathology.  Keywon Mestre  07/14/2017 10:24 AM

## 2017-07-14 NOTE — Anesthesia Procedure Notes (Signed)
Procedure Name: Intubation Date/Time: 07/14/2017 10:57 AM Performed by: Carron Curie, CRNA Pre-anesthesia Checklist: Patient identified, Emergency Drugs available, Suction available, Patient being monitored and Timeout performed Patient Re-evaluated:Patient Re-evaluated prior to induction Oxygen Delivery Method: Circle system utilized Preoxygenation: Pre-oxygenation with 100% oxygen Induction Type: IV induction Ventilation: Mask ventilation without difficulty Laryngoscope Size: McGraph and 4 Grade View: Grade II Tube type: MLT Tube size: 6.0 (Size per surg. request.) mm Number of attempts: 1 Placement Confirmation: ETT inserted through vocal cords under direct vision and positive ETCO2 Secured at: 23 cm Tube secured with: Tape Dental Injury: Teeth and Oropharynx as per pre-operative assessment

## 2017-07-14 NOTE — Discharge Instructions (Signed)

## 2017-07-14 NOTE — H&P (Signed)
..  History and Physical paper copy reviewed and updated date of procedure and will be scanned into system.  Patient seen and examined.  

## 2017-07-14 NOTE — Anesthesia Post-op Follow-up Note (Signed)
Anesthesia QCDR form completed.        

## 2017-07-14 NOTE — Transfer of Care (Signed)
Immediate Anesthesia Transfer of Care Note  Patient: Ryan [name]  Procedure(s) Performed: MICRODIRECT LARYNGOSCOPY WITH EXCISION OF RIGHT VOCAL CORD LESION WITH MICRO FLAP TECHNIQUE (Right )  Patient Location: PACU  Anesthesia Type:General  Level of Consciousness: awake  Airway & Oxygen Therapy: Patient Spontanous Breathing  Post-op Assessment: Report given to RN  Post vital signs: stable  Last Vitals:  Vitals:   07/14/17 0800  BP: 126/84  Pulse: 86  Resp: 16  Temp: (!) 36.2 C  SpO2: 93%    Last Pain:  Vitals:   07/14/17 0800  TempSrc: Temporal  PainSc: 0-No pain         Complications: No apparent anesthesia complications

## 2017-07-14 NOTE — Anesthesia Postprocedure Evaluation (Signed)
Anesthesia Post Note  Patient: Ryan [name]  Procedure(s) Performed: MICRODIRECT LARYNGOSCOPY WITH EXCISION OF RIGHT VOCAL CORD LESION WITH MICRO FLAP TECHNIQUE (Right )  Patient location during evaluation: PACU Anesthesia Type: General Level of consciousness: awake and alert and oriented Pain management: pain level controlled Vital Signs Assessment: post-procedure vital signs reviewed and stable Respiratory status: spontaneous breathing Cardiovascular status: blood pressure returned to baseline Anesthetic complications: no     Last Vitals:  Vitals:   07/14/17 1117 07/14/17 1130  BP: 129/78 132/73  Pulse:  60  Resp:  16  Temp:    SpO2:  100%    Last Pain:  Vitals:   07/14/17 1115  TempSrc:   PainSc: 0-No pain                 [name]

## 2017-07-15 LAB — SURGICAL PATHOLOGY

## 2017-07-27 ENCOUNTER — Ambulatory Visit (INDEPENDENT_AMBULATORY_CARE_PROVIDER_SITE_OTHER): Payer: Medicare Other

## 2017-07-27 ENCOUNTER — Other Ambulatory Visit: Payer: Self-pay

## 2017-07-27 DIAGNOSIS — I480 Paroxysmal atrial fibrillation: Secondary | ICD-10-CM | POA: Diagnosis not present

## 2017-08-02 ENCOUNTER — Ambulatory Visit (INDEPENDENT_AMBULATORY_CARE_PROVIDER_SITE_OTHER): Payer: Medicare Other | Admitting: Internal Medicine

## 2017-08-02 ENCOUNTER — Encounter: Payer: Self-pay | Admitting: Internal Medicine

## 2017-08-02 VITALS — BP 132/74 | HR 70 | Ht 72.0 in | Wt 204.2 lb

## 2017-08-02 DIAGNOSIS — I428 Other cardiomyopathies: Secondary | ICD-10-CM | POA: Diagnosis not present

## 2017-08-02 DIAGNOSIS — I480 Paroxysmal atrial fibrillation: Secondary | ICD-10-CM

## 2017-08-02 DIAGNOSIS — Z95810 Presence of automatic (implantable) cardiac defibrillator: Secondary | ICD-10-CM | POA: Diagnosis not present

## 2017-08-02 DIAGNOSIS — I5022 Chronic systolic (congestive) heart failure: Secondary | ICD-10-CM | POA: Diagnosis not present

## 2017-08-02 NOTE — Progress Notes (Signed)
skf      Patient Care Team: Guadalupe Maple, MD as PCP - General (Unknown Physician Specialty) Carloyn Manner, MD as Referring Physician (Otolaryngology) Deboraha Sprang, MD as Consulting Physician (Cardiology)   HPI  Ryan [name] is a 67 y.o. male Seen in followup for an ICD implanted for syncope in the setting of a nonischemic cardiomyopathy. He is now device dependent. He had 6949 lead in place and underwent generator replacement with a new 6947-lead January 2012   He underwent CRT upgrade 3/18   He is significantly improved.  He has less dyspnea and less fatigue.  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  He is having no chest pain or edema.  DATE TEST    3/14 Echo   EF 40-45 %   2/18 Echo   EF 35-40 %   3/19 Echo   EF 40-45%    Labs from 12/17 and demonstrated mild hypothyroidism.  Date Cr K Hgb TSH  3/16  1.31 4.3     12/18 1.34 4.6 16.5 5.01           Past Medical History:  Diagnosis Date  . AICD (automatic cardioverter/defibrillator) present   . Anxiety   . Complete heart block ([place])   . Depression   . Diabetes mellitus, type 2 ([place])   . Dual ICD (implantable cardiac defibrillator-Medtronic    a. [id number] implanted originally s/p [id number] lead & generator change out 05/2010; b. upgraded to MDT CRT-D in 07/2016  . GERD (gastroesophageal reflux disease)   . Gout   . History of kidney stones   . Hypertension   . IBS (irritable bowel syndrome)   . Kidney stones   . Nonischemic cardiomyopathy ([place])    a. Echo 07/2012 EF 35-40%, severe HK of the mid-distalanterior myocardium w/ AK of the distalinferior myocardium, GR1DD, mild MR, mild to mod dilated LA; b/ echo 06/2016 EF 35-40%, severe HK of the mid-apicalanteroseptal and anterior myocardium and severe HK of the apicalinferior myocardium along with AK of the apical myocardium, GR1DD, calcified mitral annulus, mildly dilated left atrium  . Paroxysmal atrial fibrillation ([place])    a. CHADS2VASc => 3 (CHF,  HTN, age x 1); b. on ASA only  . Presence of permanent cardiac pacemaker   . Syncope     Past Surgical History:  Procedure Laterality Date  . BIV UPGRADE N/A 07/21/2016   Procedure: BiV Upgrade;  Surgeon: Deboraha Sprang, MD;  Location: [place] [facility];  Service: Cardiovascular;  Laterality: N/A;  . CARDIAC DEFIBRILLATOR PLACEMENT    . DIRECT LARYNGOSCOPY Right 07/14/2017   Procedure: MICRODIRECT LARYNGOSCOPY WITH EXCISION OF RIGHT VOCAL CORD LESION WITH MICRO FLAP TECHNIQUE;  Surgeon: Carloyn Manner, MD;  Location: [facility];  Service: ENT;  Laterality: Right;  . LARYNGOSCOPY Right 01/06/2017   Procedure: LARYNGOSCOPY WITH EXCISION OF RIGHT VOCAL CORD LESION;  Surgeon: Carloyn Manner, MD;  Location: [facility] OR[facility];  Service: ENT;  Laterality: Right;  . LITHOTRIPSY    . UPPER ENDOSCOPY W/ ESOPHAGEAL MANOMETRY      Current Outpatient Medications  Medication Sig Dispense Refill  . carvedilol (COREG) 12.5 MG tablet Take 1 tablet (12.5 mg total) by mouth 2 (two) times daily. 180 tablet 3  . dexlansoprazole (DEXILANT) 60 MG capsule Take 1 capsule (60 mg total) by mouth daily. (Patient taking differently: Take 60 mg by mouth daily before breakfast. ) 90 capsule 4  . ENTRESTO 24-26 MG  TAKE 1 TABLET TWICE DAILY. 60 tablet 3  . INVOKANA 100 MG TABS tablet 1 Tablet daily ORAL 90 tablet 0  . ranitidine (ZANTAC) 300 MG tablet Take 300 mg by mouth at bedtime.     Marland Kitchen spironolactone (ALDACTONE) 25 MG tablet Take 0.5 tablets (12.5 mg total) by mouth daily. 15 tablet 2  . ULORIC 40 MG tablet Take 1 tablet (40 mg total) by mouth daily. 90 tablet 0   No current facility-administered medications for this visit.     Allergies  Allergen Reactions  . Ace Inhibitors     Muscle aches   . Crestor [Rosuvastatin Calcium] Other (See Comments)    Muscle aches    Review of Systems negative except from HPI and PMH  Physical Exam BP 132/74 (BP Location: Left Arm, Patient Position: Sitting, Cuff Size: Normal)    Pulse 70   Ht 6' (1.829 m)   Wt 204 lb 4 oz (92.6 kg)   BMI 27.70 kg/m  Well developed and nourished in no acute distress HENT normal Neck supple with JVP-flat Clear Device pocket well healed; without hematoma or erythema.  There is no tethering  Regular rate and rhythm, no murmurs or gallops Abd-soft with active BS No Clubbing cyanosis edema Skin-warm and dry A & Oriented  Grossly normal sensory and motor function    ECG demonstrates P synchronous pacing with a positive QRS in lead V1 and a negative QRS 1  Assessment and  Plan  Nonischemic cardiomyopathy     Complete heart block   Paroxysmal atrial fibrillation    Status post ICD-Medtronic CRT    Chronic systolic heart failure     Hypertension  Hypothyroidism  Orthostatic lightheadedness  Renal insufficiency    No intercurrent atrial fibrillation or flutter  No intercurrent Ventricular tachycardia  Euvolemic continue current meds  OI is tolerable presently,  If he has more significant lightheadedness will decrease AM carvedilol 12.5>>6.25  We spent more than 50% of our >25 min visit in face to face counseling regarding the above

## 2017-08-02 NOTE — Patient Instructions (Signed)
Medication Instructions: - Your physician recommends that you continue on your current medications as directed. Please refer to the Current Medication list given to you today.  Labwork: - Your physician recommends that you have lab work today: [employer]  Procedures/Testing: - none ordered  Follow-Up: - Remote monitoring is used to monitor your Pacemaker of ICD from home. This monitoring reduces the number of office visits required to check your device to one time per year. It allows [place] to keep an eye on the functioning of your device to ensure it is working properly. You are scheduled for a device check from home on 11/01/17. You may send your transmission at any time that day. If you have a wireless device, the transmission will be sent automatically. After your physician reviews your transmission, you will receive a postcard with your next transmission date.   - Your physician wants you to follow-up in: 6 months with Dr. Caryl Comes.You will receive a reminder letter in the mail two months in advance. If you don't receive a letter, please call our office to schedule the follow-up appointment.   Any Additional Special Instructions Will Be Listed Below (If Applicable).     If you need a refill on your cardiac medications before your next appointment, please call your pharmacy.

## 2017-08-03 LAB — BASIC METABOLIC PANEL
BUN/Creatinine Ratio: 13 (ref 10–24)
BUN: 17 mg/dL (ref 8–27)
CALCIUM: 9.9 mg/dL (ref 8.6–10.2)
CHLORIDE: 104 mmol/L (ref 96–106)
CO2: 19 mmol/L — ABNORMAL LOW (ref 20–29)
CREATININE: 1.34 mg/dL — AB (ref 0.76–1.27)
GFR calc Af Amer: 63 mL/min/{1.73_m2} (ref >59)
GFR calc non Af Amer: 55 mL/min/{1.73_m2} — ABNORMAL LOW (ref >59)
GLUCOSE: 205 mg/dL — AB (ref 65–99)
Potassium: 4.5 mmol/L (ref 3.5–5.2)
Sodium: 140 mmol/L (ref 134–144)

## 2017-08-18 ENCOUNTER — Other Ambulatory Visit: Payer: Self-pay | Admitting: Internal Medicine

## 2017-08-25 LAB — CUP PACEART INCLINIC DEVICE CHECK
Implantable Lead Implant Date: 20120111
Implantable Lead Implant Date: 20180314
Implantable Lead Location: 753858
Implantable Lead Location: 753859
Implantable Lead Model: 5076
Implantable Lead Model: 6947
MDC IDC LEAD IMPLANT DT: 20060505
MDC IDC LEAD LOCATION: 753860
MDC IDC PG IMPLANT DT: 20180314
MDC IDC SESS DTM: 20190418122228

## 2017-08-26 ENCOUNTER — Other Ambulatory Visit: Payer: Self-pay | Admitting: Family Medicine

## 2017-09-09 DIAGNOSIS — D141 Benign neoplasm of larynx: Secondary | ICD-10-CM | POA: Diagnosis not present

## 2017-10-05 ENCOUNTER — Other Ambulatory Visit: Payer: Self-pay | Admitting: Family Medicine

## 2017-10-05 DIAGNOSIS — M1000 Idiopathic gout, unspecified site: Secondary | ICD-10-CM

## 2017-10-17 ENCOUNTER — Other Ambulatory Visit: Payer: Self-pay | Admitting: Family Medicine

## 2017-10-17 DIAGNOSIS — L820 Inflamed seborrheic keratosis: Secondary | ICD-10-CM | POA: Diagnosis not present

## 2017-10-17 DIAGNOSIS — L570 Actinic keratosis: Secondary | ICD-10-CM | POA: Diagnosis not present

## 2017-10-17 DIAGNOSIS — L578 Other skin changes due to chronic exposure to nonionizing radiation: Secondary | ICD-10-CM | POA: Diagnosis not present

## 2017-10-17 DIAGNOSIS — D1801 Hemangioma of skin and subcutaneous tissue: Secondary | ICD-10-CM | POA: Diagnosis not present

## 2017-10-17 DIAGNOSIS — Z85828 Personal history of other malignant neoplasm of skin: Secondary | ICD-10-CM | POA: Diagnosis not present

## 2017-10-17 DIAGNOSIS — L821 Other seborrheic keratosis: Secondary | ICD-10-CM | POA: Diagnosis not present

## 2017-10-17 DIAGNOSIS — Z1283 Encounter for screening for malignant neoplasm of skin: Secondary | ICD-10-CM | POA: Diagnosis not present

## 2017-10-18 NOTE — Telephone Encounter (Signed)
invokana refill Last Refill:08/26/17 # 30 No RF Last OV: 04/27/17 (has appt 11/08/17) PCP: Dr Jeananne Rama Pharmacy:[facility] Last HgbA1C: 04/25/17 =7.0

## 2017-10-28 ENCOUNTER — Other Ambulatory Visit: Payer: Self-pay | Admitting: Family Medicine

## 2017-10-28 ENCOUNTER — Other Ambulatory Visit: Payer: Self-pay | Admitting: Internal Medicine

## 2017-10-28 DIAGNOSIS — I429 Cardiomyopathy, unspecified: Secondary | ICD-10-CM

## 2017-10-28 DIAGNOSIS — I428 Other cardiomyopathies: Secondary | ICD-10-CM

## 2017-10-31 NOTE — Telephone Encounter (Signed)
Dexilant DR 60 mg refill request and Invokana 100 mg refill request  LOV 04/27/17  With Dr. Virgil [facility] [place], [place].

## 2017-11-01 ENCOUNTER — Other Ambulatory Visit: Payer: Self-pay | Admitting: Family Medicine

## 2017-11-01 ENCOUNTER — Ambulatory Visit (INDEPENDENT_AMBULATORY_CARE_PROVIDER_SITE_OTHER): Payer: Medicare Other | Admitting: *Deleted

## 2017-11-01 ENCOUNTER — Telehealth: Payer: Self-pay

## 2017-11-01 DIAGNOSIS — I5022 Chronic systolic (congestive) heart failure: Secondary | ICD-10-CM

## 2017-11-01 DIAGNOSIS — I428 Other cardiomyopathies: Secondary | ICD-10-CM | POA: Diagnosis not present

## 2017-11-01 DIAGNOSIS — I10 Essential (primary) hypertension: Secondary | ICD-10-CM

## 2017-11-01 DIAGNOSIS — I429 Cardiomyopathy, unspecified: Secondary | ICD-10-CM

## 2017-11-01 NOTE — Telephone Encounter (Signed)
Spoke with pt and reminded pt of remote transmission that is due today. Pt verbalized understanding.   

## 2017-11-02 ENCOUNTER — Encounter: Payer: Self-pay | Admitting: Cardiology

## 2017-11-02 IMAGING — CR DG CHEST 2V
2 series · 2 of 2 positions shown · non-contrast
Comparison: 05/21/2010 .

CLINICAL DATA: Chest soreness.

EXAM:
CHEST  2 VIEW

[chest pa]
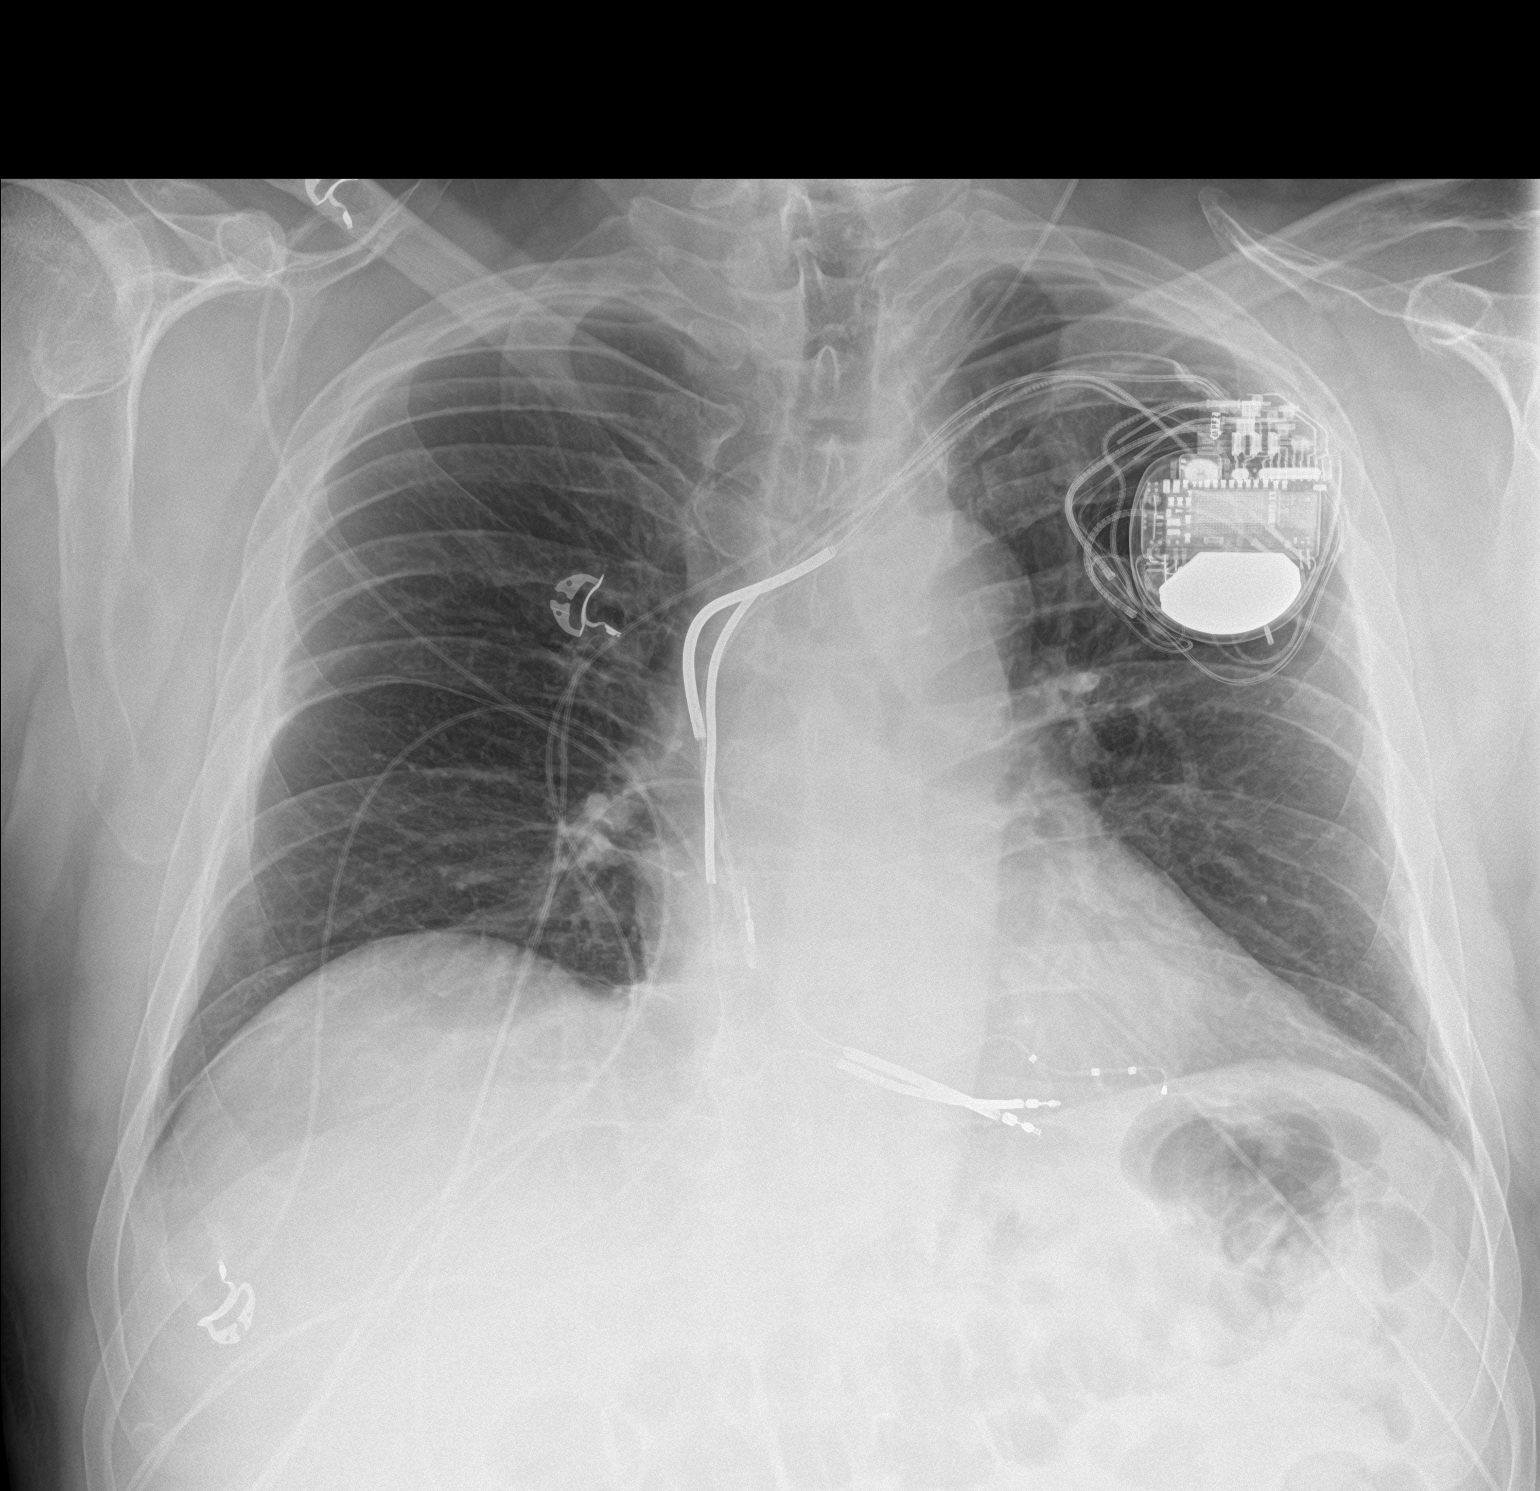

[chest lat]
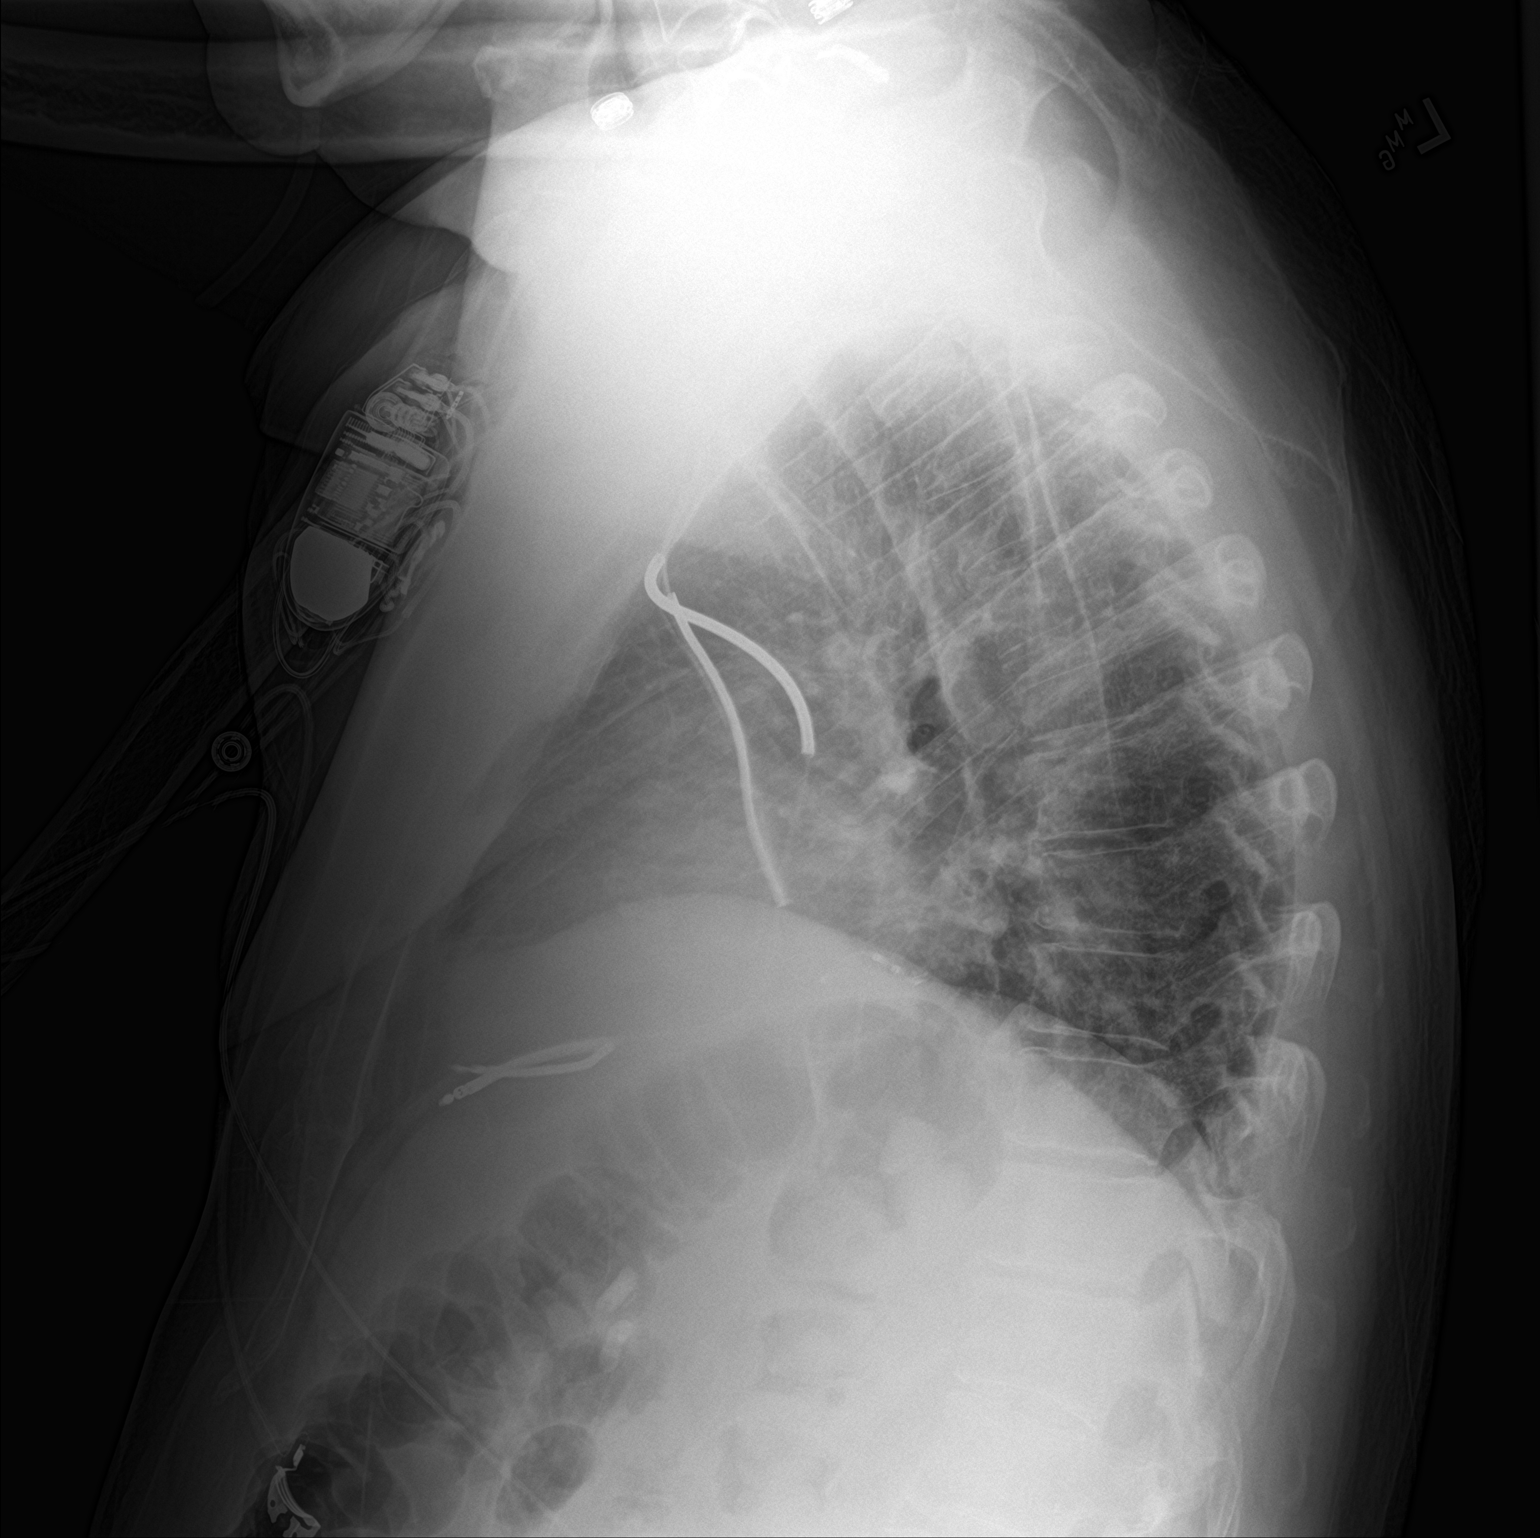

[2 of 2 positions shown; findings below may reference images not displayed]

FINDINGS: Mediastinum and hilar structures normal. AICD noted. Heart size
normal. No focal pulmonary infiltrate. No pleural effusion or
pneumothorax. No acute bony abnormality.
IMPRESSION: 1.  AICD noted.  No pneumothorax.

2. Cardiomegaly. No CHF. No acute cardiopulmonary disease identified
.

## 2017-11-02 NOTE — Progress Notes (Signed)
Remote ICD transmission.   

## 2017-11-03 LAB — CUP PACEART REMOTE DEVICE CHECK
Brady Statistic AP VP Percent: 10.22 %
Brady Statistic AP VS Percent: 0.01 %
Brady Statistic AS VP Percent: 89.77 %
Brady Statistic RA Percent Paced: 10.22 %
Date Time Interrogation Session: 20190625212606
HIGH POWER IMPEDANCE MEASURED VALUE: 52 Ohm
HIGH POWER IMPEDANCE MEASURED VALUE: 65 Ohm
Implantable Lead Implant Date: 20120111
Implantable Lead Location: 753860
Implantable Lead Model: 5076
Implantable Lead Model: 6947
Implantable Pulse Generator Implant Date: 20180314
Lead Channel Impedance Value: 1007 Ohm
Lead Channel Impedance Value: 232.653
Lead Channel Impedance Value: 249.509
Lead Channel Impedance Value: 255.093
Lead Channel Impedance Value: 266.667
Lead Channel Impedance Value: 456 Ohm
Lead Channel Impedance Value: 551 Ohm
Lead Channel Impedance Value: 646 Ohm
Lead Channel Impedance Value: 836 Ohm
Lead Channel Impedance Value: 874 Ohm
Lead Channel Pacing Threshold Amplitude: 0.625 V
Lead Channel Pacing Threshold Pulse Width: 0.4 ms
Lead Channel Pacing Threshold Pulse Width: 0.4 ms
Lead Channel Pacing Threshold Pulse Width: 0.4 ms
Lead Channel Setting Pacing Amplitude: 1.25 V
Lead Channel Setting Pacing Amplitude: 1.5 V
Lead Channel Setting Pacing Amplitude: 2 V
Lead Channel Setting Pacing Pulse Width: 0.4 ms
Lead Channel Setting Pacing Pulse Width: 0.4 ms
MDC IDC LEAD IMPLANT DT: 20060505
MDC IDC LEAD IMPLANT DT: 20180314
MDC IDC LEAD LOCATION: 753858
MDC IDC LEAD LOCATION: 753859
MDC IDC MSMT BATTERY REMAINING LONGEVITY: 94 mo
MDC IDC MSMT BATTERY VOLTAGE: 2.98 V
MDC IDC MSMT LEADCHNL LV IMPEDANCE VALUE: 289.049
MDC IDC MSMT LEADCHNL LV IMPEDANCE VALUE: 475 Ohm
MDC IDC MSMT LEADCHNL LV IMPEDANCE VALUE: 608 Ohm
MDC IDC MSMT LEADCHNL LV IMPEDANCE VALUE: 893 Ohm
MDC IDC MSMT LEADCHNL LV IMPEDANCE VALUE: 988 Ohm
MDC IDC MSMT LEADCHNL LV IMPEDANCE VALUE: 988 Ohm
MDC IDC MSMT LEADCHNL LV PACING THRESHOLD AMPLITUDE: 1.5 V
MDC IDC MSMT LEADCHNL RA IMPEDANCE VALUE: 418 Ohm
MDC IDC MSMT LEADCHNL RA PACING THRESHOLD AMPLITUDE: 0.625 V
MDC IDC MSMT LEADCHNL RA SENSING INTR AMPL: 1.5 mV
MDC IDC MSMT LEADCHNL RA SENSING INTR AMPL: 1.5 mV
MDC IDC MSMT LEADCHNL RV IMPEDANCE VALUE: 589 Ohm
MDC IDC SET LEADCHNL RV SENSING SENSITIVITY: 0.3 mV
MDC IDC STAT BRADY AS VS PERCENT: 0 %
MDC IDC STAT BRADY RV PERCENT PACED: 99.98 %

## 2017-11-03 NOTE — Telephone Encounter (Signed)
LOV 04-27-17 with Dr. Jeananne Rama (routine physical) / Refill request for Aldactone / Last filled: spironolactone (ALDACTONE) 25 MG tablet 15 tablet 2 11/17/2016    Sig - Route: Take 0.5 tablets (12.5 mg total) by mouth daily. - Oral   Sent to pharmacy as: spironolactone (ALDACTONE) 25 MG tablet    / Next appointment 11-08-17 / Refilled per protocol.

## 2017-11-08 ENCOUNTER — Encounter: Payer: Self-pay | Admitting: Family Medicine

## 2017-11-08 ENCOUNTER — Ambulatory Visit (INDEPENDENT_AMBULATORY_CARE_PROVIDER_SITE_OTHER): Payer: Medicare Other | Admitting: Family Medicine

## 2017-11-08 VITALS — BP 129/80 | HR 69 | Ht 72.0 in | Wt 199.5 lb

## 2017-11-08 DIAGNOSIS — E119 Type 2 diabetes mellitus without complications: Secondary | ICD-10-CM | POA: Diagnosis not present

## 2017-11-08 DIAGNOSIS — Z794 Long term (current) use of insulin: Secondary | ICD-10-CM | POA: Diagnosis not present

## 2017-11-08 DIAGNOSIS — Z1211 Encounter for screening for malignant neoplasm of colon: Secondary | ICD-10-CM | POA: Diagnosis not present

## 2017-11-08 DIAGNOSIS — I10 Essential (primary) hypertension: Secondary | ICD-10-CM | POA: Diagnosis not present

## 2017-11-08 DIAGNOSIS — E7800 Pure hypercholesterolemia, unspecified: Secondary | ICD-10-CM | POA: Diagnosis not present

## 2017-11-08 DIAGNOSIS — N182 Chronic kidney disease, stage 2 (mild): Secondary | ICD-10-CM | POA: Diagnosis not present

## 2017-11-08 DIAGNOSIS — I5022 Chronic systolic (congestive) heart failure: Secondary | ICD-10-CM | POA: Diagnosis not present

## 2017-11-08 DIAGNOSIS — I428 Other cardiomyopathies: Secondary | ICD-10-CM

## 2017-11-08 DIAGNOSIS — M1000 Idiopathic gout, unspecified site: Secondary | ICD-10-CM

## 2017-11-08 DIAGNOSIS — Z0001 Encounter for general adult medical examination with abnormal findings: Secondary | ICD-10-CM

## 2017-11-08 DIAGNOSIS — I129 Hypertensive chronic kidney disease with stage 1 through stage 4 chronic kidney disease, or unspecified chronic kidney disease: Secondary | ICD-10-CM

## 2017-11-08 DIAGNOSIS — Z7189 Other specified counseling: Secondary | ICD-10-CM | POA: Diagnosis not present

## 2017-11-08 DIAGNOSIS — I429 Cardiomyopathy, unspecified: Secondary | ICD-10-CM

## 2017-11-08 LAB — LP+ALT+AST PICCOLO, WAIVED
ALT (SGPT) PICCOLO, WAIVED: 35 U/L (ref 10–47)
AST (SGOT) PICCOLO, WAIVED: 25 U/L (ref 11–38)
Chol/HDL Ratio Piccolo,Waive: 4.1 mg/dL
Cholesterol Piccolo, Waived: 137 mg/dL (ref <200)
HDL Chol Piccolo, Waived: 33 mg/dL — ABNORMAL LOW (ref >59)
LDL Chol Calc Piccolo Waived: 67 mg/dL (ref <100)
TRIGLYCERIDES PICCOLO,WAIVED: 185 mg/dL — AB (ref <150)
VLDL Chol Calc Piccolo,Waive: 37 mg/dL — ABNORMAL HIGH (ref <30)

## 2017-11-08 LAB — HEMOGLOBIN A1C
HEMOGLOBIN A1C: 6.9
Hemoglobin A1C: 6.9

## 2017-11-08 LAB — BAYER DCA HB A1C WAIVED: HB A1C (BAYER DCA - WAIVED): 6.9 % (ref <7.0)

## 2017-11-08 MED ORDER — FEBUXOSTAT 40 MG PO TABS: 40.0000 mg | ORAL_TABLET | Freq: Every day | ORAL | 3 refills | Status: DC

## 2017-11-08 MED ORDER — CANAGLIFLOZIN 100 MG PO TABS: 300.0000 mg | ORAL_TABLET | Freq: Every day | ORAL | 3 refills | Status: DC

## 2017-11-08 MED ORDER — DEXLANSOPRAZOLE 60 MG PO CPDR: 60.0000 mg | DELAYED_RELEASE_CAPSULE | Freq: Every day | ORAL | 3 refills | Status: DC

## 2017-11-08 NOTE — Progress Notes (Signed)
BP 129/80   Pulse 69   Ht 6' (1.829 m)   Wt 199 lb 8 oz (90.5 kg)   SpO2 99%   BMI 27.06 kg/m    Subjective:    Patient ID: Ryan [name], male    DOB: [birth year], 67 y.o.   [name]: [id number]  HPI: [name] is a 67 y.o. male  Patient all in all doing very well no heart limitations no CHF type symptoms.  Blood pressure good control. Diabetes good control no low blood sugar spells.  BPH cholesterol gout all stable. Hoarseness followed by ENT and stable. No recent change in medications. Has Cologuard test at home will go ahead and complete.  Relevant past medical, surgical, family and social history reviewed and updated as indicated. Interim medical history since our last visit reviewed. Allergies and medications reviewed and updated.  Review of Systems  Constitutional: Negative.   HENT: Negative.   Eyes: Negative.   Respiratory: Negative.   Cardiovascular: Negative.   Gastrointestinal: Negative.   Endocrine: Negative.   Genitourinary: Negative.   Musculoskeletal: Negative.   Skin: Negative.   Allergic/Immunologic: Negative.   Neurological: Negative.   Hematological: Negative.   Psychiatric/Behavioral: Negative.     Per HPI unless specifically indicated above     Objective:    BP 129/80   Pulse 69   Ht 6' (1.829 m)   Wt 199 lb 8 oz (90.5 kg)   SpO2 99%   BMI 27.06 kg/m   Wt Readings from Last 3 Encounters:  11/08/17 199 lb 8 oz (90.5 kg)  08/02/17 204 lb 4 oz (92.6 kg)  07/14/17 202 lb (91.6 kg)    Physical Exam  Constitutional: He is oriented to person, place, and time. He appears well-developed and well-nourished.  HENT:  Head: Normocephalic and atraumatic.  Right Ear: External ear normal.  Left Ear: External ear normal.  Eyes: Pupils are equal, round, and reactive to light. Conjunctivae and EOM are normal.  Neck: Normal range of motion. Neck supple.  Cardiovascular: Normal rate, regular rhythm, normal heart sounds and intact distal pulses.    Pulmonary/Chest: Effort normal and breath sounds normal.  Abdominal: Soft. Bowel sounds are normal. There is no splenomegaly or hepatomegaly.  Genitourinary:  Genitourinary Comments: Previously normal  Musculoskeletal: Normal range of motion.  Neurological: He is alert and oriented to person, place, and time. He has normal reflexes.  Skin: No rash noted. No erythema.  Psychiatric: He has a normal mood and affect. His behavior is normal. Judgment and thought content normal.        Assessment & Plan:   Problem List Items Addressed This Visit      Cardiovascular and Mediastinum   Idiopathic cardiomyopathy ([place])    The current medical regimen is effective;  continue present plan and medications.       Relevant Medications   dexlansoprazole (DEXILANT) 60 MG capsule   SYSTOLIC HEART FAILURE, CHRONIC    The current medical regimen is effective;  continue present plan and medications.       Hypertension    The current medical regimen is effective;  continue present plan and medications.       Relevant Orders   Bayer DCA Hb A1c Waived   LP+ALT+AST Piccolo, Waived     Endocrine   Diabetes mellitus ([place]) - Primary    The current medical regimen is effective;  continue present plan and medications.       Relevant Medications   canagliflozin [facility])  100 MG TABS tablet   Other Relevant Orders   Bayer DCA Hb A1c Waived   LP+ALT+AST Piccolo, Waived   Basic metabolic panel     Genitourinary   CKD (chronic kidney disease), stage II   Relevant Orders   Bayer DCA Hb A1c Waived   LP+ALT+AST Piccolo, Waived     Other   Hyperlipidemia    The current medical regimen is effective;  continue present plan and medications.       Relevant Orders   Bayer DCA Hb A1c Waived   LP+ALT+AST Piccolo, Waived   Gout    The current medical regimen is effective;  continue present plan and medications.       Relevant Medications   febuxostat (ULORIC) 40 MG tablet   Advanced care  planning/counseling discussion    A voluntary discussion about advanced care planning including explanation and discussion of advanced directives was extentively discussed with the patient.  Explained about the healthcare proxy and living will was reviewed and packet with forms with expiration of how to fill them out was given.  Time spent: Encounter 16+ min individuals present: Patient       Other Visit Diagnoses    Colon cancer screening           Follow up plan: Return in about 6 months (around 05/11/2018) for Hemoglobin A1c, BMP,  Lipids, ALT, AST.

## 2017-11-08 NOTE — Assessment & Plan Note (Signed)
The current medical regimen is effective;  continue present plan and medications.  

## 2017-11-08 NOTE — Assessment & Plan Note (Signed)
A voluntary discussion about advanced care planning including explanation and discussion of advanced directives was extentively discussed with the patient.  Explained about the healthcare proxy and living will was reviewed and packet with forms with expiration of how to fill them out was given.  Time spent: Encounter 16+ min individuals present: Patient 

## 2017-11-09 ENCOUNTER — Encounter: Payer: Self-pay | Admitting: Family Medicine

## 2017-11-09 ENCOUNTER — Other Ambulatory Visit: Payer: Self-pay | Admitting: Family Medicine

## 2017-11-09 LAB — BASIC METABOLIC PANEL
BUN/Creatinine Ratio: 11 (ref 10–24)
BUN: 15 mg/dL (ref 8–27)
CALCIUM: 9.8 mg/dL (ref 8.6–10.2)
CHLORIDE: 106 mmol/L (ref 96–106)
CO2: 20 mmol/L (ref 20–29)
Creatinine, Ser: 1.37 mg/dL — ABNORMAL HIGH (ref 0.76–1.27)
GFR calc non Af Amer: 53 mL/min/{1.73_m2} — ABNORMAL LOW (ref >59)
GFR, EST AFRICAN AMERICAN: 61 mL/min/{1.73_m2} (ref >59)
GLUCOSE: 196 mg/dL — AB (ref 65–99)
POTASSIUM: 4.1 mmol/L (ref 3.5–5.2)
Sodium: 142 mmol/L (ref 134–144)

## 2017-11-09 MED ORDER — CANAGLIFLOZIN 100 MG PO TABS: 100.0000 mg | ORAL_TABLET | Freq: Every day | ORAL | 4 refills | Status: DC

## 2017-11-30 ENCOUNTER — Other Ambulatory Visit: Payer: Self-pay | Admitting: Family Medicine

## 2017-11-30 DIAGNOSIS — I10 Essential (primary) hypertension: Secondary | ICD-10-CM

## 2017-11-30 DIAGNOSIS — I428 Other cardiomyopathies: Secondary | ICD-10-CM

## 2017-11-30 DIAGNOSIS — I429 Cardiomyopathy, unspecified: Secondary | ICD-10-CM

## 2017-12-29 ENCOUNTER — Other Ambulatory Visit: Payer: Self-pay | Admitting: Family Medicine

## 2017-12-29 DIAGNOSIS — I10 Essential (primary) hypertension: Secondary | ICD-10-CM

## 2017-12-29 DIAGNOSIS — I429 Cardiomyopathy, unspecified: Secondary | ICD-10-CM

## 2017-12-29 DIAGNOSIS — I428 Other cardiomyopathies: Secondary | ICD-10-CM

## 2017-12-29 NOTE — Telephone Encounter (Signed)
Refill of Aldactone by historical provider  LRF  11/17/17  #15  2 refills  LOV 11/08/17  Dr. Jeananne Rama  [facility] - [place], [place] - [place]

## 2018-01-31 ENCOUNTER — Ambulatory Visit (INDEPENDENT_AMBULATORY_CARE_PROVIDER_SITE_OTHER): Payer: Medicare Other | Admitting: *Deleted

## 2018-01-31 ENCOUNTER — Ambulatory Visit (INDEPENDENT_AMBULATORY_CARE_PROVIDER_SITE_OTHER): Payer: Medicare Other | Admitting: Internal Medicine

## 2018-01-31 ENCOUNTER — Encounter: Payer: Self-pay | Admitting: Internal Medicine

## 2018-01-31 VITALS — BP 140/80 | HR 68 | Ht 72.0 in | Wt 199.5 lb

## 2018-01-31 DIAGNOSIS — I5022 Chronic systolic (congestive) heart failure: Secondary | ICD-10-CM

## 2018-01-31 DIAGNOSIS — I480 Paroxysmal atrial fibrillation: Secondary | ICD-10-CM

## 2018-01-31 DIAGNOSIS — I428 Other cardiomyopathies: Secondary | ICD-10-CM

## 2018-01-31 DIAGNOSIS — Z95810 Presence of automatic (implantable) cardiac defibrillator: Secondary | ICD-10-CM

## 2018-01-31 NOTE — Progress Notes (Signed)
skf      Patient Care Team: Guadalupe Maple, MD as PCP - General (Unknown Physician Specialty) Carloyn Manner, MD as Referring Physician (Otolaryngology) Deboraha Sprang, MD as Consulting Physician (Cardiology)   HPI  Ryan [name] is a 67 y.o. male Seen in followup for an ICD implanted for syncope in the setting of a nonischemic cardiomyopathy. He is now device dependent. He had 6949 lead in place and underwent generator replacement with a new 6947-lead January 2012   Underwent CRT upgrade 3/18     DATE TEST EF   3/14 Echo 40-45 %   2/18 Echo 35-40 %   3/19 Echo   40-45%    Labs from 12/17 and demonstrated mild hypothyroidism.  Date Cr K Hgb TSH  3/16  1.31 4.3     12/18 1.34 4.6 16.5 5.01  7/19 1.37 4.1                  The patient denies chest pain, shortness of breath, nocturnal dyspnea, orthopnea or peripheral edema.  There have been no palpitations, lightheadedness or syncope.    Has lost 30 lbs but HgbA1c still > 7  Thinks the invokana responsible for diarrhea   Past Medical History:  Diagnosis Date  . AICD (automatic cardioverter/defibrillator) present   . Anxiety   . Complete heart block ([place])   . Depression   . Diabetes mellitus, type 2 ([place])   . Dual ICD (implantable cardiac defibrillator-Medtronic    a. [id number] implanted originally s/p [phone] lead & generator change out 05/2010; b. upgraded to MDT CRT-D in 07/2016  . GERD (gastroesophageal reflux disease)   . Gout   . History of kidney stones   . Hypertension   . IBS (irritable bowel syndrome)   . Kidney stones   . Nonischemic cardiomyopathy ([place])    a. Echo 07/2012 EF 35-40%, severe HK of the mid-distalanterior myocardium w/ AK of the distalinferior myocardium, GR1DD, mild MR, mild to mod dilated LA; b/ echo 06/2016 EF 35-40%, severe HK of the mid-apicalanteroseptal and anterior myocardium and severe HK of the apicalinferior myocardium along with AK of the apical myocardium, GR1DD, calcified mitral  annulus, mildly dilated left atrium  . Paroxysmal atrial fibrillation ([place])    a. CHADS2VASc => 3 (CHF, HTN, age x 1); b. on ASA only  . Presence of permanent cardiac pacemaker   . Syncope     Past Surgical History:  Procedure Laterality Date  . BIV UPGRADE N/A 07/21/2016   Procedure: BiV Upgrade;  Surgeon: Deboraha Sprang, MD;  Location: [facility];  Service: Cardiovascular;  Laterality: N/A;  . CARDIAC DEFIBRILLATOR PLACEMENT    . DIRECT LARYNGOSCOPY Right 07/14/2017   Procedure: MICRODIRECT LARYNGOSCOPY WITH EXCISION OF RIGHT VOCAL CORD LESION WITH MICRO FLAP TECHNIQUE;  Surgeon: Carloyn Manner, MD;  Location: [facility];  Service: ENT;  Laterality: Right;  . LARYNGOSCOPY Right 01/06/2017   Procedure: LARYNGOSCOPY WITH EXCISION OF RIGHT VOCAL CORD LESION;  Surgeon: Carloyn Manner, MD;  Location: [facility];  Service: ENT;  Laterality: Right;  . LITHOTRIPSY    . UPPER ENDOSCOPY W/ ESOPHAGEAL MANOMETRY      Current Outpatient Medications  Medication Sig Dispense Refill  . canagliflozin (INVOKANA) 100 MG TABS tablet Take 1 tablet (100 mg total) by mouth daily before breakfast. 90 tablet 4  . carvedilol (COREG) 12.5 MG tablet Take 1 tablet (12.5 mg total) by mouth 2 (two) times daily. 180 tablet 3  . dexlansoprazole (DEXILANT)  60 MG capsule Take 1 capsule (60 mg total) by mouth daily. 90 capsule 3  . ENTRESTO 24-26 MG TAKE 1 TABLET TWICE DAILY. 60 tablet 3  . febuxostat (ULORIC) 40 MG tablet Take 1 tablet (40 mg total) by mouth daily. 90 tablet 3  . ranitidine (ZANTAC) 300 MG tablet Take 300 mg by mouth at bedtime.     Marland Kitchen spironolactone (ALDACTONE) 25 MG tablet Take 0.5 tablets (12.5 mg total) by mouth daily. 15 tablet 2   No current facility-administered medications for this visit.     Allergies  Allergen Reactions  . Ace Inhibitors     Muscle aches   . Crestor [Rosuvastatin Calcium] Other (See Comments)    Muscle aches    Review of Systems negative except from HPI and  PMH  Physical Exam BP 140/80 (BP Location: Left Arm, Patient Position: Sitting, Cuff Size: Normal)   Pulse 68   Ht 6' (1.829 m)   Wt 199 lb 8 oz (90.5 kg)   BMI 27.06 kg/m  Well developed and nourished in no acute distress HENT normal Neck supple with JVP-flat Clear Device pocket well healed; without hematoma or erythema.  There is no tethering  Regular rate and rhythm, no murmurs or gallops Abd-soft with active BS No Clubbing cyanosis edema Skin-warm and dry A & Oriented  Grossly normal sensory and motor function    ECG demonstrates P synchronous pacing with an upright QRS V1 negative QRS 1 15/17/49  Assessment and  Plan  Nonischemic cardiomyopathy     Complete heart block   Paroxysmal atrial fibrillation    Status post ICD-Medtronic CRT    Chronic systolic heart failure     Hypertension   Renal insufficiency  Gd 3    No intercurrent atrial fibrillation or flutter  No intercurrent Ventricular tachycardia  Euvolemic continue current meds  BP well controlled and now iwthout orthostasis . Potassium levels are followed every 6 months by Dr. Natale Milch appropriate for his Aldactone

## 2018-01-31 NOTE — Progress Notes (Signed)
Remote ICD transmission.   

## 2018-01-31 NOTE — Patient Instructions (Addendum)
Medication Instructions: - Your physician recommends that you continue on your current medications as directed. Please refer to the Current Medication list given to you today.  Labwork: - none ordered  Procedures/Testing: - none ordered  Follow-Up: - Remote monitoring is used to monitor your Pacemaker of ICD from home. This monitoring reduces the number of office visits required to check your device to one time per year. It allows [place] to keep an eye on the functioning of your device to ensure it is working properly. You are scheduled for a device check from home on 05/02/18. You may send your transmission at any time that day. If you have a wireless device, the transmission will be sent automatically. After your physician reviews your transmission, you will receive a postcard with your next transmission date.  - Your physician wants you to follow-up in: 1 year with Dr. Caryl Comes.  You will receive a reminder letter in the mail/ call two months in advance. If you don't receive a letter/ call, please call our office to schedule the follow-up appointment.   Any Additional Special Instructions Will Be Listed Below (If Applicable).     If you need a refill on your cardiac medications before your next appointment, please call your pharmacy.

## 2018-02-01 ENCOUNTER — Encounter: Payer: Self-pay | Admitting: Cardiology

## 2018-02-08 LAB — CUP PACEART REMOTE DEVICE CHECK
Brady Statistic AP VS Percent: 0.01 %
Brady Statistic AS VP Percent: 85.86 %
Brady Statistic RA Percent Paced: 14.13 %
Brady Statistic RV Percent Paced: 99.97 %
HIGH POWER IMPEDANCE MEASURED VALUE: 50 Ohm
HIGH POWER IMPEDANCE MEASURED VALUE: 62 Ohm
Implantable Lead Implant Date: 20060505
Implantable Lead Location: 753858
Implantable Lead Location: 753860
Implantable Lead Model: 6947
Lead Channel Impedance Value: 232.653
Lead Channel Impedance Value: 241.412
Lead Channel Impedance Value: 246.635
Lead Channel Impedance Value: 255.093
Lead Channel Impedance Value: 265.661
Lead Channel Impedance Value: 646 Ohm
Lead Channel Impedance Value: 760 Ohm
Lead Channel Impedance Value: 779 Ohm
Lead Channel Impedance Value: 817 Ohm
Lead Channel Impedance Value: 817 Ohm
Lead Channel Impedance Value: 874 Ohm
Lead Channel Pacing Threshold Pulse Width: 0.4 ms
Lead Channel Pacing Threshold Pulse Width: 0.4 ms
Lead Channel Sensing Intrinsic Amplitude: 1.75 mV
Lead Channel Sensing Intrinsic Amplitude: 1.75 mV
Lead Channel Setting Pacing Amplitude: 1.25 V
Lead Channel Setting Pacing Amplitude: 2 V
Lead Channel Setting Pacing Pulse Width: 0.4 ms
MDC IDC LEAD IMPLANT DT: 20120111
MDC IDC LEAD IMPLANT DT: 20180314
MDC IDC LEAD LOCATION: 753859
MDC IDC MSMT BATTERY REMAINING LONGEVITY: 88 mo
MDC IDC MSMT BATTERY VOLTAGE: 2.99 V
MDC IDC MSMT LEADCHNL LV IMPEDANCE VALUE: 456 Ohm
MDC IDC MSMT LEADCHNL LV IMPEDANCE VALUE: 475 Ohm
MDC IDC MSMT LEADCHNL LV IMPEDANCE VALUE: 513 Ohm
MDC IDC MSMT LEADCHNL LV IMPEDANCE VALUE: 551 Ohm
MDC IDC MSMT LEADCHNL LV IMPEDANCE VALUE: 893 Ohm
MDC IDC MSMT LEADCHNL LV PACING THRESHOLD AMPLITUDE: 1.125 V
MDC IDC MSMT LEADCHNL LV PACING THRESHOLD PULSEWIDTH: 0.4 ms
MDC IDC MSMT LEADCHNL RA IMPEDANCE VALUE: 418 Ohm
MDC IDC MSMT LEADCHNL RA PACING THRESHOLD AMPLITUDE: 0.625 V
MDC IDC MSMT LEADCHNL RV IMPEDANCE VALUE: 551 Ohm
MDC IDC MSMT LEADCHNL RV PACING THRESHOLD AMPLITUDE: 0.5 V
MDC IDC PG IMPLANT DT: 20180314
MDC IDC SESS DTM: 20190924062726
MDC IDC SET LEADCHNL LV PACING PULSEWIDTH: 0.4 ms
MDC IDC SET LEADCHNL RV PACING AMPLITUDE: 1.5 V
MDC IDC SET LEADCHNL RV SENSING SENSITIVITY: 0.3 mV
MDC IDC STAT BRADY AP VP PERCENT: 14.13 %
MDC IDC STAT BRADY AS VS PERCENT: 0 %

## 2018-04-14 ENCOUNTER — Encounter: Payer: Self-pay | Admitting: Family Medicine

## 2018-04-20 ENCOUNTER — Telehealth: Payer: Self-pay | Admitting: Family Medicine

## 2018-04-20 NOTE — Telephone Encounter (Signed)
Alwyn Ren,  I spoke with Mr. Toya he was given a bill for July 2nd (preventative visit estimate 65 and over) for $325.00 and the supplemental plan also kicked it back. I believe his July 2nd visit he was charged [phone] and his April 27, 2017 visit he was charged for the [phone] maybe since they were not a full 12 months apart he was billed for that?  Could you please call him back at this # to discuss? [phone]  Thanks! Curt Bears

## 2018-04-28 ENCOUNTER — Other Ambulatory Visit: Payer: Self-pay | Admitting: Internal Medicine

## 2018-05-02 ENCOUNTER — Ambulatory Visit (INDEPENDENT_AMBULATORY_CARE_PROVIDER_SITE_OTHER): Payer: Medicare Other

## 2018-05-02 DIAGNOSIS — I428 Other cardiomyopathies: Secondary | ICD-10-CM

## 2018-05-02 DIAGNOSIS — I5022 Chronic systolic (congestive) heart failure: Secondary | ICD-10-CM

## 2018-05-03 LAB — CUP PACEART REMOTE DEVICE CHECK
Battery Remaining Longevity: 80 mo
Battery Voltage: 2.98 V
Brady Statistic AP VP Percent: 9.81 %
Brady Statistic RV Percent Paced: 99.97 %
Date Time Interrogation Session: 20191224062303
HIGH POWER IMPEDANCE MEASURED VALUE: 60 Ohm
HighPow Impedance: 48 Ohm
Implantable Lead Implant Date: 20120111
Implantable Lead Implant Date: 20180314
Implantable Lead Location: 753858
Implantable Lead Location: 753859
Implantable Lead Model: 5076
Implantable Lead Model: 6947
Lead Channel Impedance Value: 218.087
Lead Channel Impedance Value: 230.327
Lead Channel Impedance Value: 418 Ohm
Lead Channel Impedance Value: 456 Ohm
Lead Channel Impedance Value: 513 Ohm
Lead Channel Impedance Value: 532 Ohm
Lead Channel Impedance Value: 779 Ohm
Lead Channel Impedance Value: 817 Ohm
Lead Channel Impedance Value: 874 Ohm
Lead Channel Pacing Threshold Amplitude: 0.625 V
Lead Channel Pacing Threshold Amplitude: 0.625 V
Lead Channel Pacing Threshold Amplitude: 1.5 V
Lead Channel Pacing Threshold Pulse Width: 0.4 ms
Lead Channel Sensing Intrinsic Amplitude: 1 mV
Lead Channel Setting Pacing Amplitude: 1.25 V
Lead Channel Setting Pacing Amplitude: 1.5 V
Lead Channel Setting Pacing Pulse Width: 0.4 ms
Lead Channel Setting Sensing Sensitivity: 0.3 mV
MDC IDC LEAD IMPLANT DT: 20060505
MDC IDC LEAD LOCATION: 753860
MDC IDC MSMT LEADCHNL LV IMPEDANCE VALUE: 218.087
MDC IDC MSMT LEADCHNL LV IMPEDANCE VALUE: 228 Ohm
MDC IDC MSMT LEADCHNL LV IMPEDANCE VALUE: 241.412
MDC IDC MSMT LEADCHNL LV IMPEDANCE VALUE: 456 Ohm
MDC IDC MSMT LEADCHNL LV IMPEDANCE VALUE: 760 Ohm
MDC IDC MSMT LEADCHNL LV IMPEDANCE VALUE: 779 Ohm
MDC IDC MSMT LEADCHNL LV IMPEDANCE VALUE: 779 Ohm
MDC IDC MSMT LEADCHNL LV PACING THRESHOLD PULSEWIDTH: 0.4 ms
MDC IDC MSMT LEADCHNL RA IMPEDANCE VALUE: 418 Ohm
MDC IDC MSMT LEADCHNL RA SENSING INTR AMPL: 1 mV
MDC IDC MSMT LEADCHNL RV IMPEDANCE VALUE: 608 Ohm
MDC IDC MSMT LEADCHNL RV PACING THRESHOLD PULSEWIDTH: 0.4 ms
MDC IDC PG IMPLANT DT: 20180314
MDC IDC SET LEADCHNL LV PACING AMPLITUDE: 2 V
MDC IDC SET LEADCHNL LV PACING PULSEWIDTH: 0.4 ms
MDC IDC STAT BRADY AP VS PERCENT: 0.01 %
MDC IDC STAT BRADY AS VP PERCENT: 90.18 %
MDC IDC STAT BRADY AS VS PERCENT: 0 %
MDC IDC STAT BRADY RA PERCENT PACED: 9.81 %

## 2018-05-04 ENCOUNTER — Other Ambulatory Visit: Payer: Self-pay | Admitting: Family Medicine

## 2018-05-04 ENCOUNTER — Other Ambulatory Visit: Payer: Self-pay | Admitting: Internal Medicine

## 2018-05-04 NOTE — Progress Notes (Signed)
Remote ICD transmission.   

## 2018-05-04 NOTE — Telephone Encounter (Signed)
Requested medication (s) are due for refill today: not specified  Requested medication (s) are on the active medication list: yes    Last refill: 12/02/17  Future visit scheduled yes 05/24/2018  Notes to clinic:historical provider  Requested Prescriptions  Pending Prescriptions Disp Refills   ranitidine (ZANTAC) 300 MG tablet [Pharmacy Med Name: RANITIDINE 300 MG TABLET] 180 tablet 0    Sig: Take 1 tablet (300 mg total) by mouth 2 (two) times daily.     Gastroenterology:  H2 Antagonists Passed - 05/04/2018 12:09 PM      Passed - Valid encounter within last 12 months    Recent Outpatient Visits          5 months ago Type 2 diabetes mellitus without complication, with long-term current use of insulin ([place])   [facility] [place], Jeannette How, MD   1 year ago PE (physical exam), annual   [facility] Crissman, Jeannette How, MD   1 year ago [place], Jeannette How, MD   1 year ago Hoarseness   [facility], [place]   1 year ago Type 2 diabetes mellitus without complication, without long-term current use of insulin [facility])   [place], [place]      Future Appointments            In 2 weeks [place], Jeannette How, MD [facility]

## 2018-05-15 ENCOUNTER — Encounter: Payer: Self-pay | Admitting: Family Medicine

## 2018-05-15 DIAGNOSIS — E119 Type 2 diabetes mellitus without complications: Secondary | ICD-10-CM | POA: Diagnosis not present

## 2018-05-15 LAB — HM DIABETES EYE EXAM

## 2018-05-17 ENCOUNTER — Ambulatory Visit: Payer: Medicare Other | Admitting: Family Medicine

## 2018-05-18 ENCOUNTER — Ambulatory Visit: Payer: Medicare Other

## 2018-05-24 ENCOUNTER — Encounter: Payer: Self-pay | Admitting: Family Medicine

## 2018-05-24 ENCOUNTER — Ambulatory Visit (INDEPENDENT_AMBULATORY_CARE_PROVIDER_SITE_OTHER): Payer: Medicare Other | Admitting: Family Medicine

## 2018-05-24 VITALS — BP 116/76 | HR 70 | Temp 98.1°F | Ht 72.0 in | Wt 200.6 lb

## 2018-05-24 DIAGNOSIS — E7800 Pure hypercholesterolemia, unspecified: Secondary | ICD-10-CM | POA: Diagnosis not present

## 2018-05-24 DIAGNOSIS — N182 Chronic kidney disease, stage 2 (mild): Secondary | ICD-10-CM

## 2018-05-24 DIAGNOSIS — I428 Other cardiomyopathies: Secondary | ICD-10-CM | POA: Diagnosis not present

## 2018-05-24 DIAGNOSIS — Z794 Long term (current) use of insulin: Secondary | ICD-10-CM

## 2018-05-24 DIAGNOSIS — I480 Paroxysmal atrial fibrillation: Secondary | ICD-10-CM | POA: Diagnosis not present

## 2018-05-24 DIAGNOSIS — N400 Enlarged prostate without lower urinary tract symptoms: Secondary | ICD-10-CM

## 2018-05-24 DIAGNOSIS — I10 Essential (primary) hypertension: Secondary | ICD-10-CM | POA: Diagnosis not present

## 2018-05-24 DIAGNOSIS — Z7189 Other specified counseling: Secondary | ICD-10-CM | POA: Diagnosis not present

## 2018-05-24 DIAGNOSIS — E119 Type 2 diabetes mellitus without complications: Secondary | ICD-10-CM | POA: Diagnosis not present

## 2018-05-24 DIAGNOSIS — M1000 Idiopathic gout, unspecified site: Secondary | ICD-10-CM

## 2018-05-24 DIAGNOSIS — I509 Heart failure, unspecified: Secondary | ICD-10-CM | POA: Diagnosis not present

## 2018-05-24 DIAGNOSIS — I429 Cardiomyopathy, unspecified: Secondary | ICD-10-CM

## 2018-05-24 MED ORDER — RANITIDINE HCL 300 MG PO TABS: 300.0000 mg | ORAL_TABLET | Freq: Every day | ORAL | 4 refills | Status: DC

## 2018-05-24 MED ORDER — DEXLANSOPRAZOLE 60 MG PO CPDR: 60.0000 mg | DELAYED_RELEASE_CAPSULE | Freq: Every day | ORAL | 4 refills | Status: DC

## 2018-05-24 MED ORDER — CANAGLIFLOZIN 100 MG PO TABS: 100.0000 mg | ORAL_TABLET | Freq: Every day | ORAL | 4 refills | Status: DC

## 2018-05-24 MED ORDER — FEBUXOSTAT 40 MG PO TABS: 40.0000 mg | ORAL_TABLET | Freq: Every day | ORAL | 4 refills | Status: DC

## 2018-05-24 NOTE — Progress Notes (Addendum)
.   Depression screen [facility] 2/9 05/24/2018 11/08/2017 04/25/2017  Decreased Interest 0 0 0  Down, Depressed, Hopeless 0 0 0  PHQ - 2 Score 0 0 0  Altered sleeping 0 - -  Tired, decreased energy 0 - -  Change in appetite 0 - -  Feeling bad or failure about yourself  0 - -  Trouble concentrating 0 - -  Moving slowly or fidgety/restless 0 - -  Suicidal thoughts 0 - -  PHQ-9 Score 0 - -    Functional Status Survey: Is the patient deaf or have difficulty hearing?: No Does the patient have difficulty seeing, even when wearing glasses/contacts?: No Does the patient have difficulty concentrating, remembering, or making decisions?: No Does the patient have difficulty walking or climbing stairs?: No Does the patient have difficulty dressing or bathing?: No Does the patient have difficulty doing errands alone such as visiting a doctor's office or shopping?: No   Fall Risk  05/24/2018 11/08/2017 04/25/2017 11/16/2016 04/14/2016  Falls in the past year? 0 No No No No  Follow up Falls evaluation completed - - - -

## 2018-05-24 NOTE — Assessment & Plan Note (Signed)
The current medical regimen is effective;  continue present plan and medications.  

## 2018-05-24 NOTE — Assessment & Plan Note (Signed)
A voluntary discussion about advanced care planning including explanation and discussion of advanced directives was extentively discussed with the patient.  Explained about the healthcare proxy and living will was reviewed and packet with forms with expiration of how to fill them out was given.  Time spent: Encounter 16+ min individuals present: Patient 

## 2018-05-24 NOTE — Assessment & Plan Note (Signed)
stable °

## 2018-05-24 NOTE — Progress Notes (Signed)
BP 116/76 (BP Location: Left Arm, Patient Position: Sitting, Cuff Size: Normal)   Pulse 70   Temp 98.1 F (36.7 C) (Oral)   Ht 6' (1.829 m)   Wt 200 lb 9.6 oz (91 kg)   SpO2 98%   BMI 27.21 kg/m    Subjective:    Patient ID: Ryan [name], male    DOB: [birth year], 68 y.o.   [name]: [id number]  HPI: Ryan [name] is a 68 y.o. male  Chief Complaint  Patient presents with  . Annual Exam  . Hyperlipidemia  . Hypertension  . Diabetes   Patient all in all doing well no complaints taking medications without problems followed by cardiology with good reports.  Reflux doing well with Dexilant and Zantac. Blood pressure good control and no real fluid problems. No complaints from diabetes no low blood sugar spells.  Relevant past medical, surgical, family and social history reviewed and updated as indicated. Interim medical history since our last visit reviewed. Allergies and medications reviewed and updated.  Review of Systems  Constitutional: Negative.   HENT: Negative.   Eyes: Negative.   Respiratory: Negative.   Cardiovascular: Negative.   Gastrointestinal: Negative.   Endocrine: Negative.   Genitourinary: Negative.   Musculoskeletal: Negative.   Skin: Negative.   Allergic/Immunologic: Negative.   Neurological: Negative.   Hematological: Negative.   Psychiatric/Behavioral: Negative.     Per HPI unless specifically indicated above     Objective:    BP 116/76 (BP Location: Left Arm, Patient Position: Sitting, Cuff Size: Normal)   Pulse 70   Temp 98.1 F (36.7 C) (Oral)   Ht 6' (1.829 m)   Wt 200 lb 9.6 oz (91 kg)   SpO2 98%   BMI 27.21 kg/m   Wt Readings from Last 3 Encounters:  05/24/18 200 lb 9.6 oz (91 kg)  01/31/18 199 lb 8 oz (90.5 kg)  11/08/17 199 lb 8 oz (90.5 kg)    Physical Exam Constitutional:      Appearance: He is well-developed.  HENT:     Head: Normocephalic and atraumatic.     Right Ear: External ear normal.     Left Ear: External ear  normal.  Eyes:     Conjunctiva/sclera: Conjunctivae normal.     Pupils: Pupils are equal, round, and reactive to light.  Neck:     Musculoskeletal: Normal range of motion and neck supple.  Cardiovascular:     Rate and Rhythm: Normal rate and regular rhythm.     Heart sounds: Normal heart sounds.  Pulmonary:     Effort: Pulmonary effort is normal.     Breath sounds: Normal breath sounds.  Abdominal:     General: Bowel sounds are normal.     Palpations: Abdomen is soft. There is no hepatomegaly or splenomegaly.  Genitourinary:    Penis: Normal.      Prostate: Normal.     Rectum: Normal.  Musculoskeletal: Normal range of motion.  Skin:    Findings: No erythema or rash.  Neurological:     Mental Status: He is alert and oriented to person, place, and time.     Deep Tendon Reflexes: Reflexes are normal and symmetric.  Psychiatric:        Behavior: Behavior normal.        Thought Content: Thought content normal.        Judgment: Judgment normal.         Assessment & Plan:   Problem List Items Addressed This  Visit      Cardiovascular and Mediastinum   Idiopathic cardiomyopathy ([place])   Relevant Medications   dexlansoprazole (DEXILANT) 60 MG capsule   Hypertension    The current medical regimen is effective;  continue present plan and medications.       Relevant Orders   TSH   CHF (congestive heart failure) ([place])    The current medical regimen is effective;  continue present plan and medications.         Endocrine   Diabetes mellitus ([place]) - Primary    The current medical regimen is effective;  continue present plan and medications.       Relevant Medications   canagliflozin (INVOKANA) 100 MG TABS tablet   Other Relevant Orders   Comprehensive metabolic panel   CBC with Differential/Platelet   TSH   Urinalysis, Routine w reflex microscopic   Bayer DCA Hb A1c Waived     Genitourinary   CKD (chronic kidney disease), stage II   Relevant Orders    Comprehensive metabolic panel   CBC with Differential/Platelet   TSH   Urinalysis, Routine w reflex microscopic   BPH (benign prostatic hyperplasia)    stable      Relevant Orders   PSA     Other   Hyperlipidemia    The current medical regimen is effective;  continue present plan and medications.       Relevant Orders   Comprehensive metabolic panel   Lipid panel   CBC with Differential/Platelet   TSH   Gout    The current medical regimen is effective;  continue present plan and medications.       Relevant Medications   febuxostat (ULORIC) 40 MG tablet   Other Relevant Orders   Uric acid   Advanced care planning/counseling discussion    A voluntary discussion about advanced care planning including explanation and discussion of advanced directives was extentively discussed with the patient.  Explained about the healthcare proxy and living will was reviewed and packet with forms with expiration of how to fill them out was given.  Time spent: Encounter 16+ min individuals present: Patient       Other Visit Diagnoses    PAF (paroxysmal atrial fibrillation) (HCC)   (Chronic)         Follow up plan: Return in about 6 months (around 11/22/2018) for Hemoglobin A1c, BMP,  Lipids, ALT, AST.

## 2018-05-25 ENCOUNTER — Telehealth: Payer: Self-pay | Admitting: Family Medicine

## 2018-05-25 DIAGNOSIS — E039 Hypothyroidism, unspecified: Secondary | ICD-10-CM

## 2018-05-25 LAB — CBC WITH DIFFERENTIAL/PLATELET
Basophils Absolute: 0.1 10*3/uL (ref 0.0–0.2)
Basos: 1 %
EOS (ABSOLUTE): 0.2 10*3/uL (ref 0.0–0.4)
EOS: 3 %
Hematocrit: 46.4 % (ref 37.5–51.0)
Hemoglobin: 16.3 g/dL (ref 13.0–17.7)
Immature Grans (Abs): 0 10*3/uL (ref 0.0–0.1)
Immature Granulocytes: 0 %
Lymphocytes Absolute: 2 10*3/uL (ref 0.7–3.1)
Lymphs: 29 %
MCH: 31.7 pg (ref 26.6–33.0)
MCHC: 35.1 g/dL (ref 31.5–35.7)
MCV: 90 fL (ref 79–97)
Monocytes Absolute: 0.6 10*3/uL (ref 0.1–0.9)
Monocytes: 8 %
Neutrophils Absolute: 4.2 10*3/uL (ref 1.4–7.0)
Neutrophils: 59 %
Platelets: 186 10*3/uL (ref 150–450)
RBC: 5.14 x10E6/uL (ref 4.14–5.80)
RDW: 12.8 % (ref 11.6–15.4)
WBC: 7.1 10*3/uL (ref 3.4–10.8)

## 2018-05-25 LAB — URIC ACID: Uric Acid: 3.5 mg/dL — ABNORMAL LOW (ref 3.7–8.6)

## 2018-05-25 LAB — COMPREHENSIVE METABOLIC PANEL
ALT: 31 IU/L (ref 0–44)
AST: 15 IU/L (ref 0–40)
Albumin/Globulin Ratio: 2.2 (ref 1.2–2.2)
Albumin: 5.1 g/dL — ABNORMAL HIGH (ref 3.6–4.8)
Alkaline Phosphatase: 48 IU/L (ref 39–117)
BUN/Creatinine Ratio: 17 (ref 10–24)
BUN: 23 mg/dL (ref 8–27)
Bilirubin Total: 0.6 mg/dL (ref 0.0–1.2)
CHLORIDE: 102 mmol/L (ref 96–106)
CO2: 19 mmol/L — ABNORMAL LOW (ref 20–29)
Calcium: 9.8 mg/dL (ref 8.6–10.2)
Creatinine, Ser: 1.37 mg/dL — ABNORMAL HIGH (ref 0.76–1.27)
GFR calc non Af Amer: 53 mL/min/{1.73_m2} — ABNORMAL LOW (ref >59)
GFR, EST AFRICAN AMERICAN: 61 mL/min/{1.73_m2} (ref >59)
GLUCOSE: 200 mg/dL — AB (ref 65–99)
Globulin, Total: 2.3 g/dL (ref 1.5–4.5)
Potassium: 4.5 mmol/L (ref 3.5–5.2)
Sodium: 137 mmol/L (ref 134–144)
Total Protein: 7.4 g/dL (ref 6.0–8.5)

## 2018-05-25 LAB — LIPID PANEL
Chol/HDL Ratio: 4 ratio (ref 0.0–5.0)
Cholesterol, Total: 145 mg/dL (ref 100–199)
HDL: 36 mg/dL — ABNORMAL LOW (ref >39)
LDL Calculated: 74 mg/dL (ref 0–99)
Triglycerides: 176 mg/dL — ABNORMAL HIGH (ref 0–149)
VLDL CHOLESTEROL CAL: 35 mg/dL (ref 5–40)

## 2018-05-25 LAB — TSH: TSH: 5.36 u[IU]/mL — ABNORMAL HIGH (ref 0.450–4.500)

## 2018-05-25 LAB — PSA: Prostate Specific Ag, Serum: 1.2 ng/mL (ref 0.0–4.0)

## 2018-05-25 MED ORDER — LEVOTHYROXINE SODIUM 50 MCG PO TABS: 50.0000 ug | ORAL_TABLET | Freq: Every day | ORAL | 4 refills | Status: DC

## 2018-05-25 NOTE — Telephone Encounter (Signed)
Phone call Discussed with patient subclinical hypothyroid. With TSH consistently being elevated over the last 2 years we will go ahead and try low-dose thyroid recheck in a couple of months to see how he is doing.

## 2018-05-29 LAB — URINALYSIS, ROUTINE W REFLEX MICROSCOPIC
Bilirubin, UA: NEGATIVE
Ketones, UA: NEGATIVE
Leukocytes, UA: NEGATIVE
Nitrite, UA: NEGATIVE
PH UA: 5 (ref 5.0–7.5)
Protein, UA: NEGATIVE
RBC, UA: NEGATIVE
Specific Gravity, UA: 1.01 (ref 1.005–1.030)
Urobilinogen, Ur: 0.2 mg/dL (ref 0.2–1.0)

## 2018-05-29 LAB — MICROSCOPIC EXAMINATION
Bacteria, UA: NONE SEEN
Epithelial Cells (non renal): NONE SEEN /hpf (ref 0–10)
RBC MICROSCOPIC, UA: NONE SEEN /HPF (ref 0–2)
WBC, UA: NONE SEEN /hpf (ref 0–5)

## 2018-05-29 LAB — BAYER DCA HB A1C WAIVED: HB A1C (BAYER DCA - WAIVED): 7 % — ABNORMAL HIGH (ref <7.0)

## 2018-05-30 NOTE — Telephone Encounter (Signed)
Spoke with patient and advised that I sent a request to Ryan [name] with [employer] to void CPE for charges for [facility] 11/08/17.

## 2018-06-26 ENCOUNTER — Telehealth: Payer: Self-pay

## 2018-06-26 ENCOUNTER — Other Ambulatory Visit: Payer: Self-pay

## 2018-06-26 MED ORDER — SACUBITRIL-VALSARTAN 24-26 MG PO TABS: 1.0000 | ORAL_TABLET | Freq: Two times a day (BID) | ORAL | 3 refills | Status: DC

## 2018-06-26 NOTE — Telephone Encounter (Signed)
Ryan [name] (Key: [id number])  Rx #: [id number]  Entresto 24-26MG  tablets  Wait for Determination Please wait for [employer] NCPDP to return a determination

## 2018-06-26 NOTE — Telephone Encounter (Signed)
PA started for patient for [place] KEY: [id number]

## 2018-06-27 NOTE — Telephone Encounter (Signed)
Prior Authorization approved 

## 2018-06-27 NOTE — Telephone Encounter (Signed)
Message from Plan PA Case: [id number] Status: Approved Coverage Starts on: 06/26/2018 12:00:00 AM Coverage Ends on: 06/26/2019 12:00:00 AM.

## 2018-08-01 ENCOUNTER — Other Ambulatory Visit: Payer: Self-pay

## 2018-08-01 ENCOUNTER — Ambulatory Visit (INDEPENDENT_AMBULATORY_CARE_PROVIDER_SITE_OTHER): Payer: Medicare Other | Admitting: *Deleted

## 2018-08-01 DIAGNOSIS — I428 Other cardiomyopathies: Secondary | ICD-10-CM

## 2018-08-01 DIAGNOSIS — I5022 Chronic systolic (congestive) heart failure: Secondary | ICD-10-CM

## 2018-08-01 LAB — CUP PACEART REMOTE DEVICE CHECK
Battery Remaining Longevity: 75 mo
Battery Voltage: 2.98 V
Brady Statistic AP VP Percent: 8.14 %
Brady Statistic AP VS Percent: 0.01 %
Brady Statistic AS VP Percent: 91.84 %
Brady Statistic RA Percent Paced: 8.15 %
Date Time Interrogation Session: 20200324084225
HighPow Impedance: 47 Ohm
HighPow Impedance: 57 Ohm
Implantable Lead Implant Date: 20060505
Implantable Lead Implant Date: 20120111
Implantable Lead Location: 753858
Implantable Lead Location: 753859
Implantable Lead Location: 753860
Implantable Lead Model: 5076
Lead Channel Impedance Value: 232.653
Lead Channel Impedance Value: 246.635
Lead Channel Impedance Value: 260.571
Lead Channel Impedance Value: 266.667
Lead Channel Impedance Value: 278.237
Lead Channel Impedance Value: 418 Ohm
Lead Channel Impedance Value: 456 Ohm
Lead Channel Impedance Value: 475 Ohm
Lead Channel Impedance Value: 551 Ohm
Lead Channel Impedance Value: 608 Ohm
Lead Channel Impedance Value: 608 Ohm
Lead Channel Impedance Value: 817 Ohm
Lead Channel Impedance Value: 836 Ohm
Lead Channel Impedance Value: 836 Ohm
Lead Channel Impedance Value: 931 Ohm
Lead Channel Impedance Value: 950 Ohm
Lead Channel Impedance Value: 950 Ohm
Lead Channel Pacing Threshold Amplitude: 0.625 V
Lead Channel Pacing Threshold Amplitude: 0.625 V
Lead Channel Pacing Threshold Amplitude: 1.375 V
Lead Channel Pacing Threshold Pulse Width: 0.4 ms
Lead Channel Pacing Threshold Pulse Width: 0.4 ms
Lead Channel Sensing Intrinsic Amplitude: 1 mV
Lead Channel Sensing Intrinsic Amplitude: 1 mV
Lead Channel Setting Pacing Amplitude: 1.25 V
Lead Channel Setting Pacing Amplitude: 1.5 V
Lead Channel Setting Pacing Pulse Width: 0.4 ms
Lead Channel Setting Pacing Pulse Width: 0.4 ms
Lead Channel Setting Sensing Sensitivity: 0.3 mV
MDC IDC LEAD IMPLANT DT: 20180314
MDC IDC MSMT LEADCHNL LV IMPEDANCE VALUE: 513 Ohm
MDC IDC MSMT LEADCHNL RV PACING THRESHOLD PULSEWIDTH: 0.4 ms
MDC IDC PG IMPLANT DT: 20180314
MDC IDC SET LEADCHNL LV PACING AMPLITUDE: 2 V
MDC IDC STAT BRADY AS VS PERCENT: 0 %
MDC IDC STAT BRADY RV PERCENT PACED: 99.91 %

## 2018-08-02 ENCOUNTER — Telehealth: Payer: Self-pay | Admitting: Family Medicine

## 2018-08-02 NOTE — Telephone Encounter (Signed)
Spoke with patient via phone to confirm AWV on Monday, 08/07/2018 at 3:15 PM and to get permission to do the [place] telephonically.  Patient authorized to do the AWV telephonically and to also do his Office Visit the same day with Dr. Jeananne Rama at 2:45 PM telephonically.  I documented this information and the requested phone number by patient in the Appointment Notes.  Patient is also asked if he can have his vitals taken and written down, a list of current medications, and a list of current providers he is seeing for the AWV.  Patient voiced understood.  Janace Hoard, Care Guide [phone].

## 2018-08-04 ENCOUNTER — Telehealth: Payer: Self-pay | Admitting: Family Medicine

## 2018-08-04 NOTE — Telephone Encounter (Signed)
Ryan [name], Spoke to pt regarding AWV and moving it to this summer. He asked that I cancel the Thyroid F/up visit with dr. Jeananne Rama scheduled for 3/30 at 2:45pm to discuss levothyroxine (SYNTHROID, LEVOTHROID) 50 MCG tablet [[id number]] due to the COVID-19 concern for his health  Pt stated that he was not having any side effects and was feeling fine at this point. Please call him if you wish to re-scheduled this appt.  Thank you, [name] [phone]

## 2018-08-07 ENCOUNTER — Ambulatory Visit: Payer: PRIVATE HEALTH INSURANCE | Admitting: Family Medicine

## 2018-08-07 ENCOUNTER — Ambulatory Visit: Payer: Medicare Other

## 2018-08-07 ENCOUNTER — Ambulatory Visit (INDEPENDENT_AMBULATORY_CARE_PROVIDER_SITE_OTHER): Payer: Medicare Other | Admitting: Family Medicine

## 2018-08-07 ENCOUNTER — Encounter: Payer: Self-pay | Admitting: Family Medicine

## 2018-08-07 ENCOUNTER — Ambulatory Visit: Payer: Medicare Other | Admitting: Family Medicine

## 2018-08-07 ENCOUNTER — Encounter: Payer: Self-pay | Admitting: Cardiology

## 2018-08-07 ENCOUNTER — Other Ambulatory Visit: Payer: Self-pay

## 2018-08-07 DIAGNOSIS — I152 Hypertension secondary to endocrine disorders: Secondary | ICD-10-CM

## 2018-08-07 DIAGNOSIS — E7800 Pure hypercholesterolemia, unspecified: Secondary | ICD-10-CM

## 2018-08-07 DIAGNOSIS — E1159 Type 2 diabetes mellitus with other circulatory complications: Secondary | ICD-10-CM

## 2018-08-07 DIAGNOSIS — I10 Essential (primary) hypertension: Secondary | ICD-10-CM

## 2018-08-07 DIAGNOSIS — M1000 Idiopathic gout, unspecified site: Secondary | ICD-10-CM

## 2018-08-07 DIAGNOSIS — I5022 Chronic systolic (congestive) heart failure: Secondary | ICD-10-CM | POA: Diagnosis not present

## 2018-08-07 NOTE — Assessment & Plan Note (Signed)
The current medical regimen is effective;  continue present plan and medications.  

## 2018-08-07 NOTE — Progress Notes (Signed)
Remote ICD transmission.   

## 2018-08-07 NOTE — Telephone Encounter (Signed)
Thank you :)

## 2018-08-07 NOTE — Progress Notes (Signed)
BP 115/69   Pulse 69   Temp (!) 97 F (36.1 C) (Oral)    Subjective:    Patient ID: Ryan [name], male    DOB: [birth year], 68 y.o.   [name]: [id number]  HPI: Ryan [name] is a 68 y.o. male  Chief Complaint  Patient presents with  . Follow-up  . Thyroid Problem  Telemedicine using audio and telecommunications for a synchronous communication visit. Today's visit due to COVID-19 isolation precautions I connected with Ryan [name] and verified that I am speaking with the correct person using two identifiers.   I discussed the limitations, risks, security and privacy concerns of performing an evaluation and management service by telephone and the availability of in person appointments. I also discussed with the patient that there may be a patient responsible charge related to this service. The patient expressed understanding and agreed to proceed. The patient's location is home. I am at home. Patient all in all doing well being very cautious with her coronavirus and heart failure diabetes care diagnoses.  Patient understands these puts him at high risk and patient being very appropriately cautious. No diabetes signs or symptoms no complaints. Blood pressure doing well no fluid retention. No gout problems or symptoms and reflux is well controlled along with his thyroids doing well.  Not able to check his TSH at this time will give thyroid refills. Relevant past medical, surgical, family and social history reviewed and updated as indicated. Interim medical history since our last visit reviewed. Allergies and medications reviewed and updated.  Review of Systems  Constitutional: Negative.   Respiratory: Negative.   Cardiovascular: Negative.     Per HPI unless specifically indicated above     Objective:    BP 115/69   Pulse 69   Temp (!) 97 F (36.1 C) (Oral)   Wt Readings from Last 3 Encounters:  05/24/18 200 lb 9.6 oz (91 kg)  01/31/18 199 lb 8 oz (90.5 kg)  11/08/17 199 lb 8  oz (90.5 kg)    Physical Exam none     Assessment & Plan:   Problem List Items Addressed This Visit      Cardiovascular and Mediastinum   SYSTOLIC HEART FAILURE, CHRONIC    The current medical regimen is effective;  continue present plan and medications.       Hypertension    The current medical regimen is effective;  continue present plan and medications.       Hypertension associated with diabetes ([place])    The current medical regimen is effective;  continue present plan and medications.         Other   Hyperlipidemia    The current medical regimen is effective;  continue present plan and medications.       Gout    The current medical regimen is effective;  continue present plan and medications.          I discussed the assessment and treatment plan with the patient. The patient was provided an opportunity to ask questions and all were answered. The patient agreed with the plan and demonstrated an understanding of the instructions.   The patient was advised to call back or seek an in-person evaluation if the symptoms worsen or if the condition fails to improve as anticipated.   I provided 21+ minutes of time during this encounter. Follow up plan: Return in about 3 months (around 11/07/2018) for Hemoglobin A1c, BMP,  Lipids, ALT, AST, TSH.

## 2018-08-07 NOTE — Telephone Encounter (Signed)
Spoke w/ patient. There was a miscommunication. Pt did not want to cancel telephone encounter w/ provider. Was rescheduled for this morning and E visit was initiated while on the phone.

## 2018-10-04 ENCOUNTER — Other Ambulatory Visit: Payer: Self-pay | Admitting: Family Medicine

## 2018-10-04 DIAGNOSIS — E039 Hypothyroidism, unspecified: Secondary | ICD-10-CM

## 2018-10-04 NOTE — Telephone Encounter (Signed)
Requested medication (s) are due for refill today:yes  Requested medication (s) are on the active medication list: yes  Last refill:  05/25/2018  Future visit scheduled:yes  Notes to clinic:  Need TSH check    Requested Prescriptions  Pending Prescriptions Disp Refills   levothyroxine (SYNTHROID) 50 MCG tablet [Pharmacy Med Name: LEVOTHYROXINE 50 MCG TABLET] 30 tablet 0    Sig: Take 1 tablet (50 mcg total) by mouth daily.     Endocrinology:  Hypothyroid Agents Failed - 10/04/2018 11:13 AM      Failed - TSH needs to be rechecked within 3 months after an abnormal result. Refill until TSH is due.      Failed - TSH in normal range and within 360 days    TSH  Date Value Ref Range Status  05/24/2018 5.360 (H) 0.450 - 4.500 uIU/mL Final         Passed - Valid encounter within last 12 months    Recent Outpatient Visits          1 month ago Pure hypercholesterolemia   [place], Jeannette How, MD   4 months ago Type 2 diabetes mellitus without complication, with long-term current use of insulin ([place])   [facility] Crissman, Mark A, MD   11 months ago Type 2 diabetes mellitus without complication, with long-term current use of insulin ([place])   [facility] Criss[place], Jeannette How, MD   1 year ago PE (physical exam), annual   [facility] Crissman, Jeannette How, MD   1 year ago [place], MD      Future Appointments            In 5 days  [place]   In 1 month Cr[place], Jeannette How, MD [facility], [facility]

## 2018-10-09 ENCOUNTER — Ambulatory Visit: Payer: Medicare Other

## 2018-10-12 ENCOUNTER — Ambulatory Visit: Payer: Medicare Other | Admitting: Family Medicine

## 2018-10-12 ENCOUNTER — Telehealth: Payer: Self-pay | Admitting: Family Medicine

## 2018-10-12 MED ORDER — DOXYCYCLINE HYCLATE 100 MG PO TABS: 100.0000 mg | ORAL_TABLET | Freq: Two times a day (BID) | ORAL | 0 refills | Status: DC

## 2018-10-12 NOTE — Telephone Encounter (Signed)
Copied from [place] [id number]. Topic: General - Inquiry >> Oct 12, 2018  8:56 AM Virl Axe D wrote: Reason for CRM: Pt stated he and his wife removed a [place] tick off of him. He read that because he is a heart pt he may need to be given antibiotics within 3 days of the bite. He would like to know what Dr. Jeananne Rama recommends. Please advise. CB# >> Oct 12, 2018  9:20 AM Jill Side wrote: Pt is scheduled for 2:30 today via Facetime

## 2018-10-26 ENCOUNTER — Other Ambulatory Visit: Payer: Self-pay | Admitting: Internal Medicine

## 2018-10-31 ENCOUNTER — Ambulatory Visit (INDEPENDENT_AMBULATORY_CARE_PROVIDER_SITE_OTHER): Payer: Medicare Other | Admitting: *Deleted

## 2018-10-31 DIAGNOSIS — I509 Heart failure, unspecified: Secondary | ICD-10-CM | POA: Diagnosis not present

## 2018-10-31 DIAGNOSIS — I442 Atrioventricular block, complete: Secondary | ICD-10-CM

## 2018-10-31 LAB — CUP PACEART REMOTE DEVICE CHECK
Battery Remaining Longevity: 69 mo
Battery Voltage: 2.97 V
Brady Statistic AP VP Percent: 8.83 %
Brady Statistic AP VS Percent: 0.01 %
Brady Statistic AS VP Percent: 91.16 %
Brady Statistic AS VS Percent: 0 %
Brady Statistic RA Percent Paced: 8.84 %
Brady Statistic RV Percent Paced: 99.97 %
Date Time Interrogation Session: 20200623062703
HighPow Impedance: 50 Ohm
HighPow Impedance: 62 Ohm
Implantable Lead Implant Date: 20060505
Implantable Lead Implant Date: 20120111
Implantable Lead Implant Date: 20180314
Implantable Lead Location: 753858
Implantable Lead Location: 753859
Implantable Lead Location: 753860
Implantable Lead Model: 5076
Implantable Lead Model: 6947
Implantable Pulse Generator Implant Date: 20180314
Lead Channel Impedance Value: 1007 Ohm
Lead Channel Impedance Value: 245.538
Lead Channel Impedance Value: 245.538
Lead Channel Impedance Value: 250.943
Lead Channel Impedance Value: 250.943
Lead Channel Impedance Value: 266 Ohm
Lead Channel Impedance Value: 456 Ohm
Lead Channel Impedance Value: 475 Ohm
Lead Channel Impedance Value: 475 Ohm
Lead Channel Impedance Value: 532 Ohm
Lead Channel Impedance Value: 532 Ohm
Lead Channel Impedance Value: 551 Ohm
Lead Channel Impedance Value: 646 Ohm
Lead Channel Impedance Value: 760 Ohm
Lead Channel Impedance Value: 817 Ohm
Lead Channel Impedance Value: 836 Ohm
Lead Channel Impedance Value: 893 Ohm
Lead Channel Impedance Value: 950 Ohm
Lead Channel Pacing Threshold Amplitude: 0.5 V
Lead Channel Pacing Threshold Amplitude: 0.625 V
Lead Channel Pacing Threshold Amplitude: 1.375 V
Lead Channel Pacing Threshold Pulse Width: 0.4 ms
Lead Channel Pacing Threshold Pulse Width: 0.4 ms
Lead Channel Pacing Threshold Pulse Width: 0.4 ms
Lead Channel Sensing Intrinsic Amplitude: 1.125 mV
Lead Channel Sensing Intrinsic Amplitude: 1.125 mV
Lead Channel Setting Pacing Amplitude: 1.25 V
Lead Channel Setting Pacing Amplitude: 1.5 V
Lead Channel Setting Pacing Amplitude: 2 V
Lead Channel Setting Pacing Pulse Width: 0.4 ms
Lead Channel Setting Pacing Pulse Width: 0.4 ms
Lead Channel Setting Sensing Sensitivity: 0.3 mV

## 2018-11-13 NOTE — Progress Notes (Signed)
Remote ICD transmission.   

## 2018-11-20 ENCOUNTER — Other Ambulatory Visit: Payer: Self-pay | Admitting: Internal Medicine

## 2018-11-22 ENCOUNTER — Ambulatory Visit: Payer: Medicare Other | Admitting: Family Medicine

## 2018-11-22 ENCOUNTER — Ambulatory Visit: Payer: Medicare Other

## 2018-11-28 ENCOUNTER — Telehealth: Payer: Self-pay

## 2018-11-28 NOTE — Telephone Encounter (Signed)
Ranitidine was recall and patient needs new Rx for PPI

## 2018-11-29 NOTE — Telephone Encounter (Signed)
Called and spoke with patient. Patient stated that he is taking the Dexilant in the mornings and he needs something else for night time as the Ranitidine was recall. Pt states that he has been taking this two medications for years and feels like  the Dexilant in the morning only is not enough.

## 2018-11-29 NOTE — Telephone Encounter (Signed)
Patient notified

## 2018-11-29 NOTE — Telephone Encounter (Signed)
Call pt there is some ranitidine that was not recalled maybe the pharmacy has some of that if not Tums Pepto-Bismol etc.

## 2018-11-29 NOTE — Telephone Encounter (Signed)
Call pt Patient has Dexilant Rx is he taking that if so that is a very potent PPI

## 2019-01-10 ENCOUNTER — Other Ambulatory Visit: Payer: Self-pay

## 2019-01-10 ENCOUNTER — Encounter: Payer: Self-pay | Admitting: Family Medicine

## 2019-01-10 ENCOUNTER — Ambulatory Visit (INDEPENDENT_AMBULATORY_CARE_PROVIDER_SITE_OTHER): Payer: Medicare Other | Admitting: Family Medicine

## 2019-01-10 DIAGNOSIS — E039 Hypothyroidism, unspecified: Secondary | ICD-10-CM

## 2019-01-10 DIAGNOSIS — E1159 Type 2 diabetes mellitus with other circulatory complications: Secondary | ICD-10-CM | POA: Diagnosis not present

## 2019-01-10 DIAGNOSIS — E7800 Pure hypercholesterolemia, unspecified: Secondary | ICD-10-CM

## 2019-01-10 DIAGNOSIS — I509 Heart failure, unspecified: Secondary | ICD-10-CM | POA: Diagnosis not present

## 2019-01-10 DIAGNOSIS — I10 Essential (primary) hypertension: Secondary | ICD-10-CM

## 2019-01-10 LAB — LP+ALT+AST PICCOLO, WAIVED
ALT (SGPT) Piccolo, Waived: 35 U/L (ref 10–47)
AST (SGOT) Piccolo, Waived: 25 U/L (ref 11–38)
Chol/HDL Ratio Piccolo,Waive: 4.1 mg/dL
Cholesterol Piccolo, Waived: 134 mg/dL (ref <200)
HDL Chol Piccolo, Waived: 33 mg/dL — ABNORMAL LOW (ref >59)
LDL Chol Calc Piccolo Waived: 49 mg/dL (ref <100)
Triglycerides Piccolo,Waived: 260 mg/dL — ABNORMAL HIGH (ref <150)
VLDL Chol Calc Piccolo,Waive: 52 mg/dL — ABNORMAL HIGH (ref <30)

## 2019-01-10 LAB — BAYER DCA HB A1C WAIVED: HB A1C (BAYER DCA - WAIVED): 7.3 % — ABNORMAL HIGH (ref <7.0)

## 2019-01-10 MED ORDER — LEVOTHYROXINE SODIUM 50 MCG PO TABS: 50.0000 ug | ORAL_TABLET | Freq: Every day | ORAL | 3 refills | Status: DC

## 2019-01-10 NOTE — Assessment & Plan Note (Signed)
The current medical regimen is effective;  continue present plan and medications.  

## 2019-01-10 NOTE — Progress Notes (Signed)
   There were no vitals taken for this visit.   Subjective:    Patient ID: Ryan [name], male    DOB: 08-19-1950, 68 y.o.   [name]: [id number]  HPI: Ryan [name] is a 68 y.o. male  Med check Patient all in all doing well no complaints Good reports from cardiology with no CHF type symptoms blood pressure doing well. No reflux issues, no gout issues. Diabetes doing well with no low blood sugar spells.  Relevant past medical, surgical, family and social history reviewed and updated as indicated. Interim medical history since our last visit reviewed. Allergies and medications reviewed and updated.  Review of Systems  Constitutional: Negative.   Respiratory: Negative.   Cardiovascular: Negative.     Per HPI unless specifically indicated above     Objective:    There were no vitals taken for this visit.  Wt Readings from Last 3 Encounters:  05/24/18 200 lb 9.6 oz (91 kg)  01/31/18 199 lb 8 oz (90.5 kg)  11/08/17 199 lb 8 oz (90.5 kg)    Physical Exam      Assessment & Plan:   Problem List Items Addressed This Visit      Cardiovascular and Mediastinum   Hypertension    The current medical regimen is effective;  continue present plan and medications.       Relevant Orders   Basic metabolic panel   Hypertension associated with diabetes ([place])    The current medical regimen is effective;  continue present plan and medications.       Relevant Orders   Bayer DCA Hb A1c Waived   Basic metabolic panel   CHF (congestive heart failure) ([place])    The current medical regimen is effective;  continue present plan and medications.         Other   Hyperlipidemia    The current medical regimen is effective;  continue present plan and medications.       Relevant Orders   LP+ALT+AST [place], [place]    Other Visit Diagnoses    Hypothyroidism, unspecified type       Relevant Medications   levothyroxine (SYNTHROID) 50 MCG tablet   Other Relevant Orders   TSH        Follow up plan: Return for Physical Exam. winter.

## 2019-01-11 LAB — BASIC METABOLIC PANEL
BUN/Creatinine Ratio: 13 (ref 10–24)
BUN: 16 mg/dL (ref 8–27)
CO2: 20 mmol/L (ref 20–29)
Calcium: 9.1 mg/dL (ref 8.6–10.2)
Chloride: 105 mmol/L (ref 96–106)
Creatinine, Ser: 1.2 mg/dL (ref 0.76–1.27)
GFR calc Af Amer: 71 mL/min/{1.73_m2} (ref >59)
GFR calc non Af Amer: 62 mL/min/{1.73_m2} (ref >59)
Glucose: 209 mg/dL — ABNORMAL HIGH (ref 65–99)
Potassium: 4.2 mmol/L (ref 3.5–5.2)
Sodium: 140 mmol/L (ref 134–144)

## 2019-01-11 LAB — TSH: TSH: 3.96 u[IU]/mL (ref 0.450–4.500)

## 2019-01-15 ENCOUNTER — Encounter: Payer: Self-pay | Admitting: Family Medicine

## 2019-01-17 ENCOUNTER — Ambulatory Visit: Payer: Medicare Other | Admitting: Family Medicine

## 2019-01-17 ENCOUNTER — Ambulatory Visit (INDEPENDENT_AMBULATORY_CARE_PROVIDER_SITE_OTHER): Payer: Medicare Other

## 2019-01-17 VITALS — BP 112/72 | HR 63 | Wt 203.0 lb

## 2019-01-17 DIAGNOSIS — Z0000 Encounter for general adult medical examination without abnormal findings: Secondary | ICD-10-CM

## 2019-01-17 NOTE — Progress Notes (Signed)
Subjective:   Ryan [name] is a 68 y.o. male who presents for an Initial Medicare Annual Wellness Visit.  This visit is being conducted via phone call  - after an attmept to do on video chat - due to the COVID-19 pandemic. This patient has given me verbal consent via phone to conduct this visit, patient states they are participating from their home address. Some vital signs may be absent or patient reported.   Patient identification: identified by name, DOB, and current address.    Review of Systems   Cardiac Risk Factors include: advanced age (>79men, >30 women);diabetes mellitus;dyslipidemia;hypertension;male gender    Objective:    Today's Vitals   01/17/19 1101  BP: 112/72  Pulse: 63  Weight: 203 lb (92.1 kg)   Body mass index is 27.53 kg/m.  Advanced Directives 01/17/2019 07/05/2017 04/25/2017 07/21/2016 04/14/2016  Does Patient Have a Medical Advance Directive? Yes Yes Yes Yes Yes  Type of Advance Directive Living will;Healthcare Power of [place];Living will [place];Living will [place];Living will Living will  Does patient want to make changes to medical advance directive? - No - Patient declined - No - Patient declined No - Patient declined  Copy of [place] in Chart? No - copy requested No - copy requested No - copy requested No - copy requested -    Current Medications (verified) Outpatient Encounter Medications as of 01/17/2019  Medication Sig  . canagliflozin (INVOKANA) 100 MG TABS tablet Take 1 tablet (100 mg total) by mouth daily before breakfast.  . carvedilol (COREG) 12.5 MG tablet Take 1 tablet (12.5 mg total) by mouth 2 (two) times daily.  Marland Kitchen dexlansoprazole (DEXILANT) 60 MG capsule Take 1 capsule (60 mg total) by mouth daily.  Marland Kitchen ENTRESTO 24-26 MG Take 1 tablet by mouth 2 (two) times daily.  . febuxostat (ULORIC) 40 MG tablet Take 1 tablet (40 mg total) by mouth daily.  Marland Kitchen  levothyroxine (SYNTHROID) 50 MCG tablet Take 1 tablet (50 mcg total) by mouth daily.  Marland Kitchen spironolactone (ALDACTONE) 25 MG tablet Take 0.5 tablets (12.5 mg total) by mouth daily.   No facility-administered encounter medications on file as of 01/17/2019.     Allergies (verified) Ace inhibitors and Crestor [rosuvastatin calcium]   History: Past Medical History:  Diagnosis Date  . AICD (automatic cardioverter/defibrillator) present   . Anxiety   . Complete heart block ([place])   . Depression   . Diabetes mellitus, type 2 ([place])   . Dual ICD (implantable cardiac defibrillator-Medtronic    a. [id number] implanted originally s/p [phone] lead & generator change out 05/2010; b. upgraded to MDT CRT-D in 07/2016  . GERD (gastroesophageal reflux disease)   . Gout   . History of kidney stones   . Hypertension   . IBS (irritable bowel syndrome)   . Kidney stones   . Nonischemic cardiomyopathy ([place])    a. Echo 07/2012 EF 35-40%, severe HK of the mid-distalanterior myocardium w/ AK of the distalinferior myocardium, GR1DD, mild MR, mild to mod dilated LA; b/ echo 06/2016 EF 35-40%, severe HK of the mid-apicalanteroseptal and anterior myocardium and severe HK of the apicalinferior myocardium along with AK of the apical myocardium, GR1DD, calcified mitral annulus, mildly dilated left atrium  . Paroxysmal atrial fibrillation ([place])    a. CHADS2VASc => 3 (CHF, HTN, age x 1); b. on ASA only  . Presence of permanent cardiac pacemaker   . Syncope  Past Surgical History:  Procedure Laterality Date  . BIV UPGRADE N/A 07/21/2016   Procedure: BiV Upgrade;  Surgeon: Deboraha Sprang, MD;  Location: [facility];  Service: Cardiovascular;  Laterality: N/A;  . CARDIAC DEFIBRILLATOR PLACEMENT    . DIRECT LARYNGOSCOPY Right 07/14/2017   Procedure: MICRODIRECT LARYNGOSCOPY WITH EXCISION OF RIGHT VOCAL CORD LESION WITH MICRO FLAP TECHNIQUE;  Surgeon: Carloyn Manner, MD;  Location: [facility];  Service: ENT;  Laterality: Right;   . LARYNGOSCOPY Right 01/06/2017   Procedure: LARYNGOSCOPY WITH EXCISION OF RIGHT VOCAL CORD LESION;  Surgeon: Carloyn Manner, MD;  Location: [facility];  Service: ENT;  Laterality: Right;  . LITHOTRIPSY    . UPPER ENDOSCOPY W/ ESOPHAGEAL MANOMETRY     Family History  Problem Relation Age of Onset  . Breast cancer Mother   . Diabetes Mother   . Heart disease Mother   . Cancer Mother        breast  . Lung cancer Father   . Diabetes Father   . Cancer Father        lung  . Diabetes Sister    Social History   Socioeconomic History  . Marital status: Married    Spouse name: Not on file  . Number of children: 1  . Years of education: 61  . Highest education level: 12th grade  Occupational History  . Occupation: Self Employed    Employer: [employer]  Social Needs  . Financial resource strain: Not hard at all  . Food insecurity    Worry: Never true    Inability: Never true  . Transportation needs    Medical: No    Non-medical: No  Tobacco Use  . Smoking status: Former Smoker    Types: Cigarettes    Quit date: 12/30/1989    Years since quitting: 29.0  . Smokeless tobacco: Never Used  Substance and Sexual Activity  . Alcohol use: Yes    Alcohol/week: 7.0 standard drinks    Types: 7 Cans of beer per week  . Drug use: No  . Sexual activity: Yes  Lifestyle  . Physical activity    Days per week: 7 days    Minutes per session: 30 min  . Stress: Not at all  Relationships  . Social connections    Talks on phone: More than three times a week    Gets together: More than three times a week    Attends religious service: Never    Active member of club or organization: No    Attends meetings of clubs or organizations: Never    Relationship status: Married  Other Topics Concern  . Not on file  Social History Narrative  . Not on file   Tobacco Counseling Counseling given: Not Answered   Clinical Intake:  Pre-visit preparation completed: Yes  Pain :  No/denies pain     Nutritional Status: BMI 25 -29 Overweight Nutritional Risks: None Diabetes: Yes CBG done?: No Did pt. bring in CBG monitor from home?: No  How often do you need to have someone help you when you read instructions, pamphlets, or other written materials from your doctor or pharmacy?: 1 - Never  Nutrition Risk Assessment:  Has the patient had any N/V/D within the last 2 months?  No  Does the patient have any non-healing wounds?  No  Has the patient had any unintentional weight loss or weight gain?  No   Diabetes:  Is the patient diabetic?  Yes  If  diabetic, was a CBG obtained today?  No  Did the patient bring in their glucometer from home?  No  How often do you monitor your CBG's? Doesn't check at home .   Financial Strains and Diabetes Management:  Are you having any financial strains with the device, your supplies or your medication? No .  Does the patient want to be seen by Chronic Care Management for management of their diabetes?  No  Would the patient like to be referred to a Nutritionist or for Diabetic Management?  No     Diabetic Exams:  Diabetic Eye Exam: Completed 05/2018 Goes to Dr.Woodard, called for results to be faxed.   Diabetic Foot Exam: Completed 05/24/2018.   Interpreter Needed?: No  Information entered by :: Megham Dwyer,LPN  Activities of Daily Living In your present state of health, do you have any difficulty performing the following activities: 01/17/2019 05/24/2018  Hearing? N N  Comment no hearing aids -  Vision? N N  Comment eyeglasses, dr.woodard annually -  Difficulty concentrating or making decisions? N N  Walking or climbing stairs? N N  Dressing or bathing? N N  Doing errands, shopping? N N  Preparing Food and eating ? N -  Using the Toilet? N -  In the past six months, have you accidently leaked urine? N -  Do you have problems with loss of bowel control? N -  Managing your Medications? N -  Managing your Finances? N  -  Housekeeping or managing your Housekeeping? N -  Some recent data might be hidden     Immunizations and Health Maintenance Immunization History  Administered Date(s) Administered  . Pneumococcal Polysaccharide-23 04/21/2005  . Td 06/10/1996, 05/20/2010  . Zoster 11/04/2011   Health Maintenance Due  Topic Date Due  . COLONOSCOPY  10/21/2000  . FOOT EXAM  06/16/2016  . URINE MICROALBUMIN  04/25/2018  . OPHTHALMOLOGY EXAM  05/12/2018  . INFLUENZA VACCINE  12/09/2018    Patient Care Team: Guadalupe Maple, MD as PCP - General (Unknown Physician Specialty) Carloyn Manner, MD as Referring Physician (Otolaryngology) Deboraha Sprang, MD as Consulting Physician (Cardiology)  Indicate any recent Medical Services you may have received from other than [facility] providers in the past year (date may be approximate).    Assessment:   This is a routine wellness examination for [place].  Hearing/Vision screen No exam data present  Dietary issues and exercise activities discussed: Current Exercise Habits: Home exercise routine, Type of exercise: walking, Time (Minutes): 60, Frequency (Times/Week): 7, Weekly Exercise (Minutes/Week): 420, Intensity: Mild, Exercise limited by: None identified  Goals   None    Depression Screen PHQ 2/9 Scores 01/17/2019 05/24/2018 11/08/2017 04/25/2017  PHQ - 2 Score 0 0 0 0  PHQ- 9 Score - 0 - -    Fall Risk Fall Risk  01/17/2019 08/07/2018 05/24/2018 11/08/2017 04/25/2017  Falls in the past year? 0 0 0 No No  Follow up - Falls evaluation completed Falls evaluation completed - -    FALL RISK PREVENTION PERTAINING TO THE HOME:  Any stairs in or around the home? Yes  If so, are there any without handrails? No   Home free of loose throw rugs in walkways, pet beds, electrical cords, etc? Yes  Adequate lighting in your home to reduce risk of falls? Yes   ASSISTIVE DEVICES UTILIZED TO PREVENT FALLS:  Life alert? No  Use of a cane, walker or w/c? No  Grab  bars in the bathroom? No  Shower chair or bench in shower? No  Elevated toilet seat or a handicapped toilet? No    TIMED UP AND GO:  Unable to perform    Cognitive Function:     6CIT Screen 04/25/2017  What Year? 0 points  What month? 0 points  What time? 0 points  Count back from 20 0 points  Months in reverse 0 points  Repeat phrase 0 points  Total Score 0    Screening Tests Health Maintenance  Topic Date Due  . COLONOSCOPY  10/21/2000  . FOOT EXAM  06/16/2016  . URINE MICROALBUMIN  04/25/2018  . OPHTHALMOLOGY EXAM  05/12/2018  . INFLUENZA VACCINE  12/09/2018  . PNA vac Low Risk Adult (1 of 2 - PCV13) 05/25/2019 (Originally 10/22/2015)  . HEMOGLOBIN A1C  07/10/2019  . TETANUS/TDAP  05/20/2020  . Hepatitis C Screening  Completed    Qualifies for Shingles Vaccine? Yes  Zostavax completed n/a. Due for Shingrix. Education has been provided regarding the importance of this vaccine. Pt has been advised to call insurance company to determine out of pocket expense. Advised may also receive vaccine at local pharmacy or Health Dept. Verbalized acceptance and understanding.  Tdap: up to date   Flu Vaccine: Due for Flu vaccine.   Pneumococcal Vaccine: Due for Pneumococcal vaccine.   Cancer Screenings:  Colorectal Screening: Cologuard ordered, patient will call when he is ready to complete   Lung Cancer Screening: (Low Dose CT Chest recommended if Age 76-80 years, 30 pack-year currently smoking OR have quit w/in 15years.) does not qualify.    Additional Screening:  Hepatitis C Screening: does qualify; Completed 04/25/2017  Vision Screening: Recommended annual ophthalmology exams for early detection of glaucoma and other disorders of the eye. Is the patient up to date with their annual eye exam?  Yes  Who is the provider or what is the name of the office in which the pt attends annual eye exams? Dr.Woodard   Dental Screening: Recommended annual dental exams for proper  oral hygiene  Community Resource Referral:  CRR required this visit?  No        Plan:  I have personally reviewed and addressed the Medicare Annual Wellness questionnaire and have noted the following in the patient's chart:  A. Medical and social history B. Use of alcohol, tobacco or illicit drugs  C. Current medications and supplements D. Functional ability and status E.  Nutritional status F.  Physical activity G. Advance directives H. List of other physicians I.  Hospitalizations, surgeries, and ER visits in previous 12 months J.  [place] such as hearing and vision if needed, cognitive and depression L. Referrals and appointments   In addition, I have reviewed and discussed with patient certain preventive protocols, quality metrics, and best practice recommendations. A written personalized care plan for preventive services as well as general preventive health recommendations were provided to patient.   Signed,    Bevelyn Ngo, LPN   [phone]  Nurse Health Advisor    Nurse Notes: none

## 2019-01-17 NOTE — Patient Instructions (Signed)
Ryan [name] , Thank you for taking time to come for your Medicare Wellness Visit. I appreciate your ongoing commitment to your health goals. Please review the following plan we discussed and let me know if I can assist you in the future.   Screening recommendations/referrals: Colonoscopy: cologuard , please call when ready to complete  Recommended yearly ophthalmology/optometry visit for glaucoma screening and checkup Recommended yearly dental visit for hygiene and checkup  Vaccinations: Influenza vaccine: due now - declined  Pneumococcal vaccine: due now.    Tdap vaccine: up to date  Shingles vaccine: shingrix eligible  Advanced directives: Please bring a copy of your health care power of attorney and living will to the office at your convenience.  Conditions/risks identified: diabetic - discussed chronic care management team.   Next appointment: follow up in one year for your annual well visit.   Preventive Care 9 Years and Older, Male Preventive care refers to lifestyle choices and visits with your health care provider that can promote health and wellness. What does preventive care include?  A yearly physical exam. This is also called an annual well check.  Dental exams once or twice a year.  Routine eye exams. Ask your health care provider how often you should have your eyes checked.  Personal lifestyle choices, including:  Daily care of your teeth and gums.  Regular physical activity.  Eating a healthy diet.  Avoiding tobacco and drug use.  Limiting alcohol use.  Practicing safe sex.  Taking low doses of aspirin every day.  Taking vitamin and mineral supplements as recommended by your health care provider. What happens during an annual well check? The services and screenings done by your health care provider during your annual well check will depend on your age, overall health, lifestyle risk factors, and family history of disease. Counseling  Your health care  provider may ask you questions about your:  Alcohol use.  Tobacco use.  Drug use.  Emotional well-being.  Home and relationship well-being.  Sexual activity.  Eating habits.  History of falls.  Memory and ability to understand (cognition).  Work and work Statistician. Screening  You may have the following tests or measurements:  Height, weight, and BMI.  Blood pressure.  Lipid and cholesterol levels. These may be checked every 5 years, or more frequently if you are over 12 years old.  Skin check.  Lung cancer screening. You may have this screening every year starting at age 108 if you have a 30-pack-year history of smoking and currently smoke or have quit within the past 15 years.  Fecal occult blood test (FOBT) of the stool. You may have this test every year starting at age 55.  Flexible sigmoidoscopy or colonoscopy. You may have a sigmoidoscopy every 5 years or a colonoscopy every 10 years starting at age 14.  Prostate cancer screening. Recommendations will vary depending on your family history and other risks.  Hepatitis C blood test.  Hepatitis B blood test.  Sexually transmitted disease (STD) testing.  Diabetes screening. This is done by checking your blood sugar (glucose) after you have not eaten for a while (fasting). You may have this done every 1-3 years.  Abdominal aortic aneurysm (AAA) screening. You may need this if you are a current or former smoker.  Osteoporosis. You may be screened starting at age 3 if you are at high risk. Talk with your health care provider about your test results, treatment options, and if necessary, the need for more tests. Vaccines  Your health care provider may recommend certain vaccines, such as:  Influenza vaccine. This is recommended every year.  Tetanus, diphtheria, and acellular pertussis (Tdap, Td) vaccine. You may need a Td booster every 10 years.  Zoster vaccine. You may need this after age 28.  Pneumococcal  13-valent conjugate (PCV13) vaccine. One dose is recommended after age 89.  Pneumococcal polysaccharide (PPSV23) vaccine. One dose is recommended after age 41. Talk to your health care provider about which screenings and vaccines you need and how often you need them. This information is not intended to replace advice given to you by your health care provider. Make sure you discuss any questions you have with your health care provider. Document Released: 05/23/2015 Document Revised: 01/14/2016 Document Reviewed: 02/25/2015 Elsevier Interactive Patient Education  2017 [employer] Prevention in the Home Falls can cause injuries. They can happen to people of all ages. There are many things you can do to make your home safe and to help prevent falls. What can I do on the outside of my home?  Regularly fix the edges of walkways and driveways and fix any cracks.  Remove anything that might make you trip as you walk through a door, such as a raised step or threshold.  Trim any bushes or trees on the path to your home.  Use bright outdoor lighting.  Clear any walking paths of anything that might make someone trip, such as rocks or tools.  Regularly check to see if handrails are loose or broken. Make sure that both sides of any steps have handrails.  Any raised decks and porches should have guardrails on the edges.  Have any leaves, snow, or ice cleared regularly.  Use sand or salt on walking paths during winter.  Clean up any spills in your garage right away. This includes oil or grease spills. What can I do in the bathroom?  Use night lights.  Install grab bars by the toilet and in the tub and shower. Do not use towel bars as grab bars.  Use non-skid mats or decals in the tub or shower.  If you need to sit down in the shower, use a plastic, non-slip stool.  Keep the floor dry. Clean up any water that spills on the floor as soon as it happens.  Remove soap buildup in the  tub or shower regularly.  Attach bath mats securely with double-sided non-slip rug tape.  Do not have throw rugs and other things on the floor that can make you trip. What can I do in the bedroom?  Use night lights.  Make sure that you have a light by your bed that is easy to reach.  Do not use any sheets or blankets that are too big for your bed. They should not hang down onto the floor.  Have a firm chair that has side arms. You can use this for support while you get dressed.  Do not have throw rugs and other things on the floor that can make you trip. What can I do in the kitchen?  Clean up any spills right away.  Avoid walking on wet floors.  Keep items that you use a lot in easy-to-reach places.  If you need to reach something above you, use a strong step stool that has a grab bar.  Keep electrical cords out of the way.  Do not use floor polish or wax that makes floors slippery. If you must use wax, use non-skid floor wax.  Do  not have throw rugs and other things on the floor that can make you trip. What can I do with my stairs?  Do not leave any items on the stairs.  Make sure that there are handrails on both sides of the stairs and use them. Fix handrails that are broken or loose. Make sure that handrails are as long as the stairways.  Check any carpeting to make sure that it is firmly attached to the stairs. Fix any carpet that is loose or worn.  Avoid having throw rugs at the top or bottom of the stairs. If you do have throw rugs, attach them to the floor with carpet tape.  Make sure that you have a light switch at the top of the stairs and the bottom of the stairs. If you do not have them, ask someone to add them for you. What else can I do to help prevent falls?  Wear shoes that:  Do not have high heels.  Have rubber bottoms.  Are comfortable and fit you well.  Are closed at the toe. Do not wear sandals.  If you use a stepladder:  Make sure that it  is fully opened. Do not climb a closed stepladder.  Make sure that both sides of the stepladder are locked into place.  Ask someone to hold it for you, if possible.  Clearly mark and make sure that you can see:  Any grab bars or handrails.  First and last steps.  Where the edge of each step is.  Use tools that help you move around (mobility aids) if they are needed. These include:  Canes.  Walkers.  Scooters.  Crutches.  Turn on the lights when you go into a dark area. Replace any light bulbs as soon as they burn out.  Set up your furniture so you have a clear path. Avoid moving your furniture around.  If any of your floors are uneven, fix them.  If there are any pets around you, be aware of where they are.  Review your medicines with your doctor. Some medicines can make you feel dizzy. This can increase your chance of falling. Ask your doctor what other things that you can do to help prevent falls. This information is not intended to replace advice given to you by your health care provider. Make sure you discuss any questions you have with your health care provider. Document Released: 02/20/2009 Document Revised: 10/02/2015 Document Reviewed: 05/31/2014 Elsevier Interactive Patient Education  2017 [employer].

## 2019-01-18 ENCOUNTER — Other Ambulatory Visit: Payer: Self-pay | Admitting: Internal Medicine

## 2019-01-19 NOTE — Telephone Encounter (Signed)
This is a [place] pt 

## 2019-01-24 ENCOUNTER — Ambulatory Visit: Payer: Medicare Other | Admitting: Family Medicine

## 2019-01-31 ENCOUNTER — Ambulatory Visit (INDEPENDENT_AMBULATORY_CARE_PROVIDER_SITE_OTHER): Payer: Medicare Other | Admitting: *Deleted

## 2019-01-31 DIAGNOSIS — I428 Other cardiomyopathies: Secondary | ICD-10-CM

## 2019-01-31 DIAGNOSIS — I509 Heart failure, unspecified: Secondary | ICD-10-CM

## 2019-01-31 LAB — CUP PACEART REMOTE DEVICE CHECK
Battery Remaining Longevity: 61 mo
Battery Voltage: 2.97 V
Brady Statistic AP VP Percent: 9.6 %
Brady Statistic AP VS Percent: 0.01 %
Brady Statistic AS VP Percent: 90.39 %
Brady Statistic AS VS Percent: 0 %
Brady Statistic RA Percent Paced: 9.61 %
Brady Statistic RV Percent Paced: 99.94 %
Date Time Interrogation Session: 20200923071607
HighPow Impedance: 48 Ohm
HighPow Impedance: 57 Ohm
Implantable Lead Implant Date: 20060505
Implantable Lead Implant Date: 20120111
Implantable Lead Implant Date: 20180314
Implantable Lead Location: 753858
Implantable Lead Location: 753859
Implantable Lead Location: 753860
Implantable Lead Model: 5076
Implantable Lead Model: 6947
Implantable Pulse Generator Implant Date: 20180314
Lead Channel Impedance Value: 232.653
Lead Channel Impedance Value: 237.5 Ohm
Lead Channel Impedance Value: 241.412
Lead Channel Impedance Value: 246.635
Lead Channel Impedance Value: 246.635
Lead Channel Impedance Value: 418 Ohm
Lead Channel Impedance Value: 456 Ohm
Lead Channel Impedance Value: 475 Ohm
Lead Channel Impedance Value: 475 Ohm
Lead Channel Impedance Value: 513 Ohm
Lead Channel Impedance Value: 513 Ohm
Lead Channel Impedance Value: 589 Ohm
Lead Channel Impedance Value: 722 Ohm
Lead Channel Impedance Value: 817 Ohm
Lead Channel Impedance Value: 817 Ohm
Lead Channel Impedance Value: 836 Ohm
Lead Channel Impedance Value: 836 Ohm
Lead Channel Impedance Value: 874 Ohm
Lead Channel Pacing Threshold Amplitude: 0.5 V
Lead Channel Pacing Threshold Amplitude: 0.625 V
Lead Channel Pacing Threshold Amplitude: 1.375 V
Lead Channel Pacing Threshold Pulse Width: 0.4 ms
Lead Channel Pacing Threshold Pulse Width: 0.4 ms
Lead Channel Pacing Threshold Pulse Width: 0.4 ms
Lead Channel Sensing Intrinsic Amplitude: 0.5 mV
Lead Channel Sensing Intrinsic Amplitude: 0.5 mV
Lead Channel Setting Pacing Amplitude: 1.25 V
Lead Channel Setting Pacing Amplitude: 1.5 V
Lead Channel Setting Pacing Amplitude: 2 V
Lead Channel Setting Pacing Pulse Width: 0.4 ms
Lead Channel Setting Pacing Pulse Width: 0.4 ms
Lead Channel Setting Sensing Sensitivity: 0.3 mV

## 2019-02-05 DIAGNOSIS — E1339 Other specified diabetes mellitus with other diabetic ophthalmic complication: Secondary | ICD-10-CM | POA: Diagnosis not present

## 2019-02-05 LAB — HM DIABETES EYE EXAM

## 2019-02-06 ENCOUNTER — Ambulatory Visit (INDEPENDENT_AMBULATORY_CARE_PROVIDER_SITE_OTHER): Payer: Medicare Other | Admitting: Internal Medicine

## 2019-02-06 ENCOUNTER — Encounter: Payer: Self-pay | Admitting: Internal Medicine

## 2019-02-06 ENCOUNTER — Other Ambulatory Visit: Payer: Self-pay

## 2019-02-06 ENCOUNTER — Encounter: Payer: Self-pay | Admitting: Cardiology

## 2019-02-06 VITALS — BP 124/70 | HR 72 | Ht 72.05 in | Wt 196.0 lb

## 2019-02-06 DIAGNOSIS — I480 Paroxysmal atrial fibrillation: Secondary | ICD-10-CM

## 2019-02-06 DIAGNOSIS — Z95810 Presence of automatic (implantable) cardiac defibrillator: Secondary | ICD-10-CM

## 2019-02-06 DIAGNOSIS — I10 Essential (primary) hypertension: Secondary | ICD-10-CM | POA: Diagnosis not present

## 2019-02-06 DIAGNOSIS — I428 Other cardiomyopathies: Secondary | ICD-10-CM

## 2019-02-06 DIAGNOSIS — I5022 Chronic systolic (congestive) heart failure: Secondary | ICD-10-CM

## 2019-02-06 NOTE — Patient Instructions (Addendum)
Medication Instructions:  - Your physician recommends that you continue on your current medications as directed. Please refer to the Current Medication list given to you today.  If you need a refill on your cardiac medications before your next appointment, please call your pharmacy.   Lab work: - none ordered  If you have labs (blood work) drawn today and your tests are completely normal, you will receive your results only by: Marland Kitchen MyChart Message (if you have MyChart) OR . A paper copy in the mail If you have any lab test that is abnormal or we need to change your treatment, we will call you to review the results.  Testing/Procedures: - none ordered  Follow-Up: At [facility], you and your health needs are our priority.  As part of our continuing mission to provide you with exceptional heart care, we have created designated Provider Care Teams.  These Care Teams include your primary Cardiologist (physician) and Advanced Practice Providers (APPs -  Physician Assistants and Nurse Practitioners) who all work together to provide you with the care you need, when you need it.  You will need a follow up appointment in 1 year (late September/ early October) with Dr. Caryl Comes.   Please call our office 2 months in advance to schedule this appointment.  (Call in early July 2021 to schedule)  Remote monitoring is used to monitor your Pacemaker of ICD from home. This monitoring reduces the number of office visits required to check your device to one time per year. It allows [place] to keep an eye on the functioning of your device to ensure it is working properly. You are scheduled for a device check from home on 05/02/19. You may send your transmission at any time that day. If you have a wireless device, the transmission will be sent automatically. After your physician reviews your transmission, you will receive a postcard with your next transmission date.   Any Other Special Instructions Will Be Listed Below  (If Applicable). - N/A

## 2019-02-06 NOTE — Progress Notes (Signed)
skf      Patient Care Team: Guadalupe Maple, MD as PCP - General (Unknown Physician Specialty) Carloyn Manner, MD as Referring Physician (Otolaryngology) Deboraha Sprang, MD as Consulting Physician (Cardiology)   HPI  Ryan [name] is a 68 y.o. male Seen in followup for an ICD implanted for syncope in the setting of a nonischemic cardiomyopathy. He is now device dependent. He had 6949 lead in place and underwent generator replacement with a new 6947-lead January 2012   Underwent CRT upgrade 3/18   The patient denies chest pain, shortness of breath, nocturnal dyspnea, orthopnea or peripheral edema.  There have been no palpitations, lightheadedness or syncope.    HgbA1c still a struggle   DATE TEST EF   3/14 Echo 40-45 %   2/18 Echo 35-40 %   3/19 Echo   40-45%    Labs from 12/17 and demonstrated mild hypothyroidism.  Date Cr K Hgb TSH  3/16  1.31 4.3     12/18 1.34 4.6 16.5 5.01  7/19 1.37 4.1    9/20 1.2 4.2 16.3 (1/20) 3.93              Past Medical History:  Diagnosis Date  . AICD (automatic cardioverter/defibrillator) present   . Anxiety   . Complete heart block ([place])   . Depression   . Diabetes mellitus, type 2 ([place])   . Dual ICD (implantable cardiac defibrillator-Medtronic    a. [id number] implanted originally s/p [phone] lead & generator change out 05/2010; b. upgraded to MDT CRT-D in 07/2016  . GERD (gastroesophageal reflux disease)   . Gout   . History of kidney stones   . Hypertension   . IBS (irritable bowel syndrome)   . Kidney stones   . Nonischemic cardiomyopathy ([place])    a. Echo 07/2012 EF 35-40%, severe HK of the mid-distalanterior myocardium w/ AK of the distalinferior myocardium, GR1DD, mild MR, mild to mod dilated LA; b/ echo 06/2016 EF 35-40%, severe HK of the mid-apicalanteroseptal and anterior myocardium and severe HK of the apicalinferior myocardium along with AK of the apical myocardium, GR1DD, calcified mitral annulus, mildly dilated left atrium  .  Paroxysmal atrial fibrillation ([place])    a. CHADS2VASc => 3 (CHF, HTN, age x 1); b. on ASA only  . Presence of permanent cardiac pacemaker   . Syncope     Past Surgical History:  Procedure Laterality Date  . BIV UPGRADE N/A 07/21/2016   Procedure: BiV Upgrade;  Surgeon: Deboraha Sprang, MD;  Location: [facility];  Service: Cardiovascular;  Laterality: N/A;  . CARDIAC DEFIBRILLATOR PLACEMENT    . DIRECT LARYNGOSCOPY Right 07/14/2017   Procedure: MICRODIRECT LARYNGOSCOPY WITH EXCISION OF RIGHT VOCAL CORD LESION WITH MICRO FLAP TECHNIQUE;  Surgeon: Carloyn Manner, MD;  Location: [facility];  Service: ENT;  Laterality: Right;  . LARYNGOSCOPY Right 01/06/2017   Procedure: LARYNGOSCOPY WITH EXCISION OF RIGHT VOCAL CORD LESION;  Surgeon: Carloyn Manner, MD;  Location: [facility];  Service: ENT;  Laterality: Right;  . LITHOTRIPSY    . UPPER ENDOSCOPY W/ ESOPHAGEAL MANOMETRY      Current Outpatient Medications  Medication Sig Dispense Refill  . canagliflozin (INVOKANA) 100 MG TABS tablet Take 1 tablet (100 mg total) by mouth daily before breakfast. 90 tablet 4  . carvedilol (COREG) 12.5 MG tablet Take 1 tablet (12.5 mg total) by mouth 2 (two) times daily. 180 tablet 0  . dexlansoprazole (DEXILANT) 60 MG capsule Take 1 capsule (60 mg total)  by mouth daily. 90 capsule 4  . ENTRESTO 24-26 MG Take 1 tablet by mouth 2 (two) times daily. 60 tablet 0  . febuxostat (ULORIC) 40 MG tablet Take 1 tablet (40 mg total) by mouth daily. 90 tablet 4  . levothyroxine (SYNTHROID) 50 MCG tablet Take 1 tablet (50 mcg total) by mouth daily. 30 tablet 3  . spironolactone (ALDACTONE) 25 MG tablet Take 0.5 tablets (12.5 mg total) by mouth daily. 15 tablet 2   No current facility-administered medications for this visit.     Allergies  Allergen Reactions  . Ace Inhibitors     Muscle aches   . Crestor [Rosuvastatin Calcium] Other (See Comments)    Muscle aches    Review of Systems negative except from HPI  and PMH  Physical Exam BP 124/70 (BP Location: Left Arm, Patient Position: Sitting, Cuff Size: Normal)   Pulse 72   Ht 6' 0.05" (1.83 m)   Wt 196 lb (88.9 kg)   BMI 26.55 kg/m  Well developed and well nourished in no acute distress HENT normal Neck supple with JVP-flat Clear Device pocket well healed; without hematoma or erythema.  There is no tethering  Regular rate and rhythm, no gallop No / murmur Abd-soft with active BS No Clubbing cyanosis edema Skin-warm and dry A & Oriented  Grossly normal sensory and motor function  ECG  P-synchronous/ AV  pacing with Upright QRS led 1 and neg QRS lead 1   Assessment and  Plan  Nonischemic cardiomyopathy     Complete heart block   Paroxysmal atrial fibrillation    Status post ICD-Medtronic CRT    Chronic systolic heart failure     Hypertension   Renal insufficiency  Gd 3    No intercurrent Ventricular tachycardia   No intercurrent atrial fibrillation or flutter   On Anticoagulation;  No bleeding issues  BP well controlled  .

## 2019-02-06 NOTE — Progress Notes (Signed)
Remote ICD transmission.   

## 2019-02-13 ENCOUNTER — Other Ambulatory Visit: Payer: Self-pay | Admitting: Family Medicine

## 2019-02-20 LAB — CUP PACEART INCLINIC DEVICE CHECK
Battery Remaining Longevity: 62 mo
Battery Voltage: 2.97 V
Brady Statistic AP VP Percent: 9.1 %
Brady Statistic AP VS Percent: 0.01 %
Brady Statistic AS VP Percent: 90.89 %
Brady Statistic AS VS Percent: 0 %
Brady Statistic RA Percent Paced: 9.1 %
Brady Statistic RV Percent Paced: 99.95 %
Date Time Interrogation Session: 20200929152000
HighPow Impedance: 55 Ohm
HighPow Impedance: 68 Ohm
Implantable Lead Implant Date: 20060505
Implantable Lead Implant Date: 20120111
Implantable Lead Implant Date: 20180314
Implantable Lead Location: 753858
Implantable Lead Location: 753859
Implantable Lead Location: 753860
Implantable Lead Model: 5076
Implantable Lead Model: 6947
Implantable Pulse Generator Implant Date: 20180314
Lead Channel Impedance Value: 1007 Ohm
Lead Channel Impedance Value: 266 Ohm
Lead Channel Impedance Value: 266 Ohm
Lead Channel Impedance Value: 279.525
Lead Channel Impedance Value: 279.525
Lead Channel Impedance Value: 279.525
Lead Channel Impedance Value: 418 Ohm
Lead Channel Impedance Value: 532 Ohm
Lead Channel Impedance Value: 532 Ohm
Lead Channel Impedance Value: 532 Ohm
Lead Channel Impedance Value: 589 Ohm
Lead Channel Impedance Value: 608 Ohm
Lead Channel Impedance Value: 665 Ohm
Lead Channel Impedance Value: 874 Ohm
Lead Channel Impedance Value: 893 Ohm
Lead Channel Impedance Value: 931 Ohm
Lead Channel Impedance Value: 931 Ohm
Lead Channel Impedance Value: 988 Ohm
Lead Channel Pacing Threshold Amplitude: 0.5 V
Lead Channel Pacing Threshold Amplitude: 0.75 V
Lead Channel Pacing Threshold Amplitude: 1 V
Lead Channel Pacing Threshold Pulse Width: 0.4 ms
Lead Channel Pacing Threshold Pulse Width: 0.4 ms
Lead Channel Pacing Threshold Pulse Width: 0.4 ms
Lead Channel Sensing Intrinsic Amplitude: 2.75 mV
Lead Channel Setting Pacing Amplitude: 1.25 V
Lead Channel Setting Pacing Amplitude: 1.5 V
Lead Channel Setting Pacing Amplitude: 2 V
Lead Channel Setting Pacing Pulse Width: 0.4 ms
Lead Channel Setting Pacing Pulse Width: 0.4 ms
Lead Channel Setting Sensing Sensitivity: 0.3 mV

## 2019-03-19 ENCOUNTER — Other Ambulatory Visit: Payer: Self-pay | Admitting: Internal Medicine

## 2019-04-16 ENCOUNTER — Other Ambulatory Visit: Payer: Self-pay | Admitting: Internal Medicine

## 2019-04-16 ENCOUNTER — Other Ambulatory Visit: Payer: Self-pay

## 2019-04-16 MED ORDER — SPIRONOLACTONE 25 MG PO TABS: ORAL_TABLET | ORAL | 1 refills | Status: DC

## 2019-05-02 ENCOUNTER — Ambulatory Visit (INDEPENDENT_AMBULATORY_CARE_PROVIDER_SITE_OTHER): Payer: Medicare Other | Admitting: *Deleted

## 2019-05-02 DIAGNOSIS — Z95810 Presence of automatic (implantable) cardiac defibrillator: Secondary | ICD-10-CM

## 2019-05-03 LAB — CUP PACEART REMOTE DEVICE CHECK
Battery Remaining Longevity: 55 mo
Battery Voltage: 2.97 V
Brady Statistic AP VP Percent: 11.26 %
Brady Statistic AP VS Percent: 0.01 %
Brady Statistic AS VP Percent: 88.73 %
Brady Statistic AS VS Percent: 0 %
Brady Statistic RA Percent Paced: 11.27 %
Brady Statistic RV Percent Paced: 99.98 %
Date Time Interrogation Session: 20201223001607
HighPow Impedance: 50 Ohm
HighPow Impedance: 62 Ohm
Implantable Lead Implant Date: 20060505
Implantable Lead Implant Date: 20120111
Implantable Lead Implant Date: 20180314
Implantable Lead Location: 753858
Implantable Lead Location: 753859
Implantable Lead Location: 753860
Implantable Lead Model: 5076
Implantable Lead Model: 6947
Implantable Pulse Generator Implant Date: 20180314
Lead Channel Impedance Value: 222.34 Ohm
Lead Channel Impedance Value: 234.08 Ohm
Lead Channel Impedance Value: 237.5 Ohm
Lead Channel Impedance Value: 250.943
Lead Channel Impedance Value: 250.943
Lead Channel Impedance Value: 399 Ohm
Lead Channel Impedance Value: 418 Ohm
Lead Channel Impedance Value: 475 Ohm
Lead Channel Impedance Value: 475 Ohm
Lead Channel Impedance Value: 532 Ohm
Lead Channel Impedance Value: 532 Ohm
Lead Channel Impedance Value: 608 Ohm
Lead Channel Impedance Value: 760 Ohm
Lead Channel Impedance Value: 779 Ohm
Lead Channel Impedance Value: 817 Ohm
Lead Channel Impedance Value: 874 Ohm
Lead Channel Impedance Value: 893 Ohm
Lead Channel Impedance Value: 950 Ohm
Lead Channel Pacing Threshold Amplitude: 0.5 V
Lead Channel Pacing Threshold Amplitude: 0.625 V
Lead Channel Pacing Threshold Amplitude: 1.625 V
Lead Channel Pacing Threshold Pulse Width: 0.4 ms
Lead Channel Pacing Threshold Pulse Width: 0.4 ms
Lead Channel Pacing Threshold Pulse Width: 0.4 ms
Lead Channel Sensing Intrinsic Amplitude: 1 mV
Lead Channel Sensing Intrinsic Amplitude: 1 mV
Lead Channel Setting Pacing Amplitude: 1.25 V
Lead Channel Setting Pacing Amplitude: 1.5 V
Lead Channel Setting Pacing Amplitude: 2 V
Lead Channel Setting Pacing Pulse Width: 0.4 ms
Lead Channel Setting Pacing Pulse Width: 0.4 ms
Lead Channel Setting Sensing Sensitivity: 0.3 mV

## 2019-05-21 ENCOUNTER — Other Ambulatory Visit: Payer: Self-pay | Admitting: Internal Medicine

## 2019-05-22 NOTE — Telephone Encounter (Signed)
This is a [employer] pt 

## 2019-05-26 ENCOUNTER — Encounter: Payer: Self-pay | Admitting: Nurse Practitioner

## 2019-05-26 DIAGNOSIS — E1159 Type 2 diabetes mellitus with other circulatory complications: Secondary | ICD-10-CM | POA: Insufficient documentation

## 2019-05-26 DIAGNOSIS — E039 Hypothyroidism, unspecified: Secondary | ICD-10-CM | POA: Insufficient documentation

## 2019-05-29 ENCOUNTER — Encounter: Payer: Self-pay | Admitting: Nurse Practitioner

## 2019-05-29 ENCOUNTER — Ambulatory Visit (INDEPENDENT_AMBULATORY_CARE_PROVIDER_SITE_OTHER): Payer: Medicare Other | Admitting: Nurse Practitioner

## 2019-05-29 ENCOUNTER — Other Ambulatory Visit: Payer: Self-pay

## 2019-05-29 VITALS — BP 125/79 | HR 69 | Temp 97.8°F | Ht 72.24 in | Wt 197.6 lb

## 2019-05-29 DIAGNOSIS — E785 Hyperlipidemia, unspecified: Secondary | ICD-10-CM | POA: Diagnosis not present

## 2019-05-29 DIAGNOSIS — N400 Enlarged prostate without lower urinary tract symptoms: Secondary | ICD-10-CM | POA: Diagnosis not present

## 2019-05-29 DIAGNOSIS — I152 Hypertension secondary to endocrine disorders: Secondary | ICD-10-CM

## 2019-05-29 DIAGNOSIS — E039 Hypothyroidism, unspecified: Secondary | ICD-10-CM

## 2019-05-29 DIAGNOSIS — E1169 Type 2 diabetes mellitus with other specified complication: Secondary | ICD-10-CM | POA: Diagnosis not present

## 2019-05-29 DIAGNOSIS — I5022 Chronic systolic (congestive) heart failure: Secondary | ICD-10-CM

## 2019-05-29 DIAGNOSIS — M1A00X0 Idiopathic chronic gout, unspecified site, without tophus (tophi): Secondary | ICD-10-CM | POA: Diagnosis not present

## 2019-05-29 DIAGNOSIS — I10 Essential (primary) hypertension: Secondary | ICD-10-CM | POA: Diagnosis not present

## 2019-05-29 DIAGNOSIS — Z0000 Encounter for general adult medical examination without abnormal findings: Secondary | ICD-10-CM

## 2019-05-29 DIAGNOSIS — E1159 Type 2 diabetes mellitus with other circulatory complications: Secondary | ICD-10-CM | POA: Diagnosis not present

## 2019-05-29 DIAGNOSIS — Z95810 Presence of automatic (implantable) cardiac defibrillator: Secondary | ICD-10-CM | POA: Diagnosis not present

## 2019-05-29 LAB — MICROALBUMIN, URINE WAIVED
Creatinine, Urine Waived: 50 mg/dL (ref 10–300)
Microalb, Ur Waived: 10 mg/L (ref 0–19)
Microalb/Creat Ratio: <30 mg/g (ref <30)

## 2019-05-29 LAB — BAYER DCA HB A1C WAIVED: HB A1C (BAYER DCA - WAIVED): 6.6 % (ref <7.0)

## 2019-05-29 NOTE — Assessment & Plan Note (Signed)
Chronic, ongoing.  Continue current medication regimen and adjust as needed.  Lipid panel today.  Would benefit from statin use with diabetes and heart health.  Will discuss further upon return of labs.

## 2019-05-29 NOTE — Assessment & Plan Note (Signed)
Stable, check PSA today. ?

## 2019-05-29 NOTE — Assessment & Plan Note (Signed)
Chronic, stable.  Continue current medication regiment and collaboration with cardiology.  CMP today.

## 2019-05-29 NOTE — Progress Notes (Signed)
BP 125/79 (BP Location: Right Arm, Cuff Size: Normal)   Pulse 69   Temp 97.8 F (36.6 C) (Oral)   Ht 6' 0.24" (1.835 m)   Wt 197 lb 9.6 oz (89.6 kg)   SpO2 97%   BMI 26.62 kg/m    Subjective:    Patient ID: Ryan [name], male    DOB: [birth year], 69 y.o.   [name]: [id number]  HPI: Ryan [name] is a 69 y.o. male presenting on 05/29/2019 for comprehensive medical examination. Current medical complaints include:none  He currently lives with: wife Interim Problems from his last visit: no   DIABETES Last A1C 7.3% in September.  Takes Invokana.  Had intolerance to Metformin. Hypoglycemic episodes:no Polydipsia/polyuria: no Visual disturbance: no Chest pain: no Paresthesias: no Glucose Monitoring: no  Accucheck frequency: Not Checking  Fasting glucose:  Post prandial:  Evening:  Before meals: Taking Insulin?: no  Long acting insulin:  Short acting insulin: Blood Pressure Monitoring: daily Retinal Examination: Up to Date Foot Exam: Up to Date Pneumovax: took several years Influenza: refused Aspirin: no   HYPERTENSION / HYPERLIPIDEMIA/HF Is followed by cardiology, Dr. Caryl Comes, last saw him in September and had cardio-defib implantable check in December.  Continues on Aldactone, Entresto, Coreg.  No statin, reports cardiology took him off of this.  Tried Pravastatin in past with poor response.   Satisfied with current treatment? yes Duration of hypertension: chronic BP monitoring frequency: a few times a week BP range: 110/70 on average BP medication side effects: no Duration of hyperlipidemia: chronic Medication compliance: good compliance Aspirin: no Recent stressors: no Recurrent headaches: no Visual changes: no Palpitations: no Dyspnea: no Chest pain: no Lower extremity edema: no Dizzy/lightheaded: no  The 10-year ASCVD risk score Mikey Bussing DC Jr., et al., 2013) is: 30.4%   Values used to calculate the score:     Age: 38 years     Sex: Male     Is Non-Hispanic  African American: No     Diabetic: Yes     Tobacco smoker: No     Systolic Blood Pressure: 0000000 mmHg     Is BP treated: Yes     HDL Cholesterol: 33 mg/dL     Total Cholesterol: 134 mg/dL   HYPOTHYROIDISM Continues Levothyroxine 50 MCG. Thyroid control status:stable Satisfied with current treatment? yes Medication side effects: no Medication compliance: good compliance Etiology of hypothyroidism:  Recent dose adjustment:no Fatigue: no Cold intolerance: no Heat intolerance: no Weight gain: no Weight loss: no Constipation: no Diarrhea/loose stools: no Palpitations: no Lower extremity edema: no Anxiety/depressed mood: no  Functional Status Survey: Is the patient deaf or have difficulty hearing?: No Does the patient have difficulty seeing, even when wearing glasses/contacts?: Yes Does the patient have difficulty concentrating, remembering, or making decisions?: No Does the patient have difficulty walking or climbing stairs?: No Does the patient have difficulty dressing or bathing?: No Does the patient have difficulty doing errands alone such as visiting a doctor's office or shopping?: No  FALL RISK: Fall Risk  01/17/2019 08/07/2018 05/24/2018 11/08/2017 04/25/2017  Falls in the past year? 0 0 0 No No  Follow up - Falls evaluation completed Falls evaluation completed - -    Depression Screen Depression screen [facility] 2/9 05/29/2019 01/17/2019 05/24/2018 11/08/2017 04/25/2017  Decreased Interest 0 0 0 0 0  Down, Depressed, Hopeless 0 0 0 0 0  PHQ - 2 Score 0 0 0 0 0  Altered sleeping 0 - 0 - -  Tired, decreased energy  0 - 0 - -  Change in appetite 0 - 0 - -  Feeling bad or failure about yourself  0 - 0 - -  Trouble concentrating 0 - 0 - -  Moving slowly or fidgety/restless 0 - 0 - -  Suicidal thoughts 0 - 0 - -  PHQ-9 Score 0 - 0 - -  Difficult doing work/chores Not difficult at all - - - -    Advanced Directives <no information>  Past Medical History:  Past Medical History:   Diagnosis Date  . AICD (automatic cardioverter/defibrillator) present   . Anxiety   . Complete heart block ([place])   . Depression   . Diabetes mellitus, type 2 ([place])   . Dual ICD (implantable cardiac defibrillator-Medtronic    a. [id number] implanted originally s/p [phone] lead & generator change out 05/2010; b. upgraded to MDT CRT-D in 07/2016  . GERD (gastroesophageal reflux disease)   . Gout   . History of kidney stones   . Hypertension   . IBS (irritable bowel syndrome)   . Kidney stones   . Nonischemic cardiomyopathy ([place])    a. Echo 07/2012 EF 35-40%, severe HK of the mid-distalanterior myocardium w/ AK of the distalinferior myocardium, GR1DD, mild MR, mild to mod dilated LA; b/ echo 06/2016 EF 35-40%, severe HK of the mid-apicalanteroseptal and anterior myocardium and severe HK of the apicalinferior myocardium along with AK of the apical myocardium, GR1DD, calcified mitral annulus, mildly dilated left atrium  . Paroxysmal atrial fibrillation ([place])    a. CHADS2VASc => 3 (CHF, HTN, age x 1); b. on ASA only  . Presence of permanent cardiac pacemaker   . Syncope     Surgical History:  Past Surgical History:  Procedure Laterality Date  . BIV UPGRADE N/A 07/21/2016   Procedure: BiV Upgrade;  Surgeon: Deboraha Sprang, MD;  Location: [facility];  Service: Cardiovascular;  Laterality: N/A;  . CARDIAC DEFIBRILLATOR PLACEMENT    . DIRECT LARYNGOSCOPY Right 07/14/2017   Procedure: MICRODIRECT LARYNGOSCOPY WITH EXCISION OF RIGHT VOCAL CORD LESION WITH MICRO FLAP TECHNIQUE;  Surgeon: Carloyn Manner, MD;  Location: [facility];  Service: ENT;  Laterality: Right;  . LARYNGOSCOPY Right 01/06/2017   Procedure: LARYNGOSCOPY WITH EXCISION OF RIGHT VOCAL CORD LESION;  Surgeon: Carloyn Manner, MD;  Location: [facility];  Service: ENT;  Laterality: Right;  . LITHOTRIPSY    . UPPER ENDOSCOPY W/ ESOPHAGEAL MANOMETRY      Medications:  Current Outpatient Medications on File Prior to Visit  Medication Sig   . canagliflozin (INVOKANA) 100 MG TABS tablet Take 1 tablet (100 mg total) by mouth daily before breakfast.  . carvedilol (COREG) 12.5 MG tablet Take 1 tablet (12.5 mg total) by mouth 2 (two) times daily.  Marland Kitchen dexlansoprazole (DEXILANT) 60 MG capsule Take 1 capsule (60 mg total) by mouth daily.  Marland Kitchen ENTRESTO 24-26 MG Take 1 tablet by mouth 2 (two) times daily.  . febuxostat (ULORIC) 40 MG tablet Take 1 tablet (40 mg total) by mouth daily.  Marland Kitchen levothyroxine (SYNTHROID) 50 MCG tablet Take 1 tablet (50 mcg total) by mouth daily.  Marland Kitchen spironolactone (ALDACTONE) 25 MG tablet Take 1 tablet (25 mg total) by mouth daily.  . vitamin B-12 (CYANOCOBALAMIN) 500 MCG tablet Take 500 mcg by mouth daily.   No current facility-administered medications on file prior to visit.    Allergies:  Allergies  Allergen Reactions  . Ace Inhibitors     Muscle aches   . Crestor [employer]  Calcium] Other (See Comments)    Muscle aches    Social History:  Social History   Socioeconomic History  . Marital status: Married    Spouse name: Not on file  . Number of children: 1  . Years of education: 75  . Highest education level: 12th grade  Occupational History  . Occupation: Self Employed    Employer: [employer]  Tobacco Use  . Smoking status: Former Smoker    Types: Cigarettes    Quit date: 12/30/1989    Years since quitting: 29.4  . Smokeless tobacco: Never Used  Substance and Sexual Activity  . Alcohol use: Yes    Alcohol/week: 7.0 standard drinks    Types: 7 Cans of beer per week  . Drug use: No  . Sexual activity: Yes  Other Topics Concern  . Not on file  Social History Narrative  . Not on file   Social Determinants of Health   Financial Resource Strain:   . Difficulty of Paying Living Expenses: Not on file  Food Insecurity:   . Worried About Charity fundraiser in the Last Year: Not on file  . Ran Out of Food in the Last Year: Not on file  Transportation Needs:   . Lack of  Transportation (Medical): Not on file  . Lack of Transportation (Non-Medical): Not on file  Physical Activity:   . Days of Exercise per Week: Not on file  . Minutes of Exercise per Session: Not on file  Stress:   . Feeling of Stress : Not on file  Social Connections:   . Frequency of Communication with Friends and Family: Not on file  . Frequency of Social Gatherings with Friends and Family: Not on file  . Attends Religious Services: Not on file  . Active Member of Clubs or Organizations: Not on file  . Attends Archivist Meetings: Not on file  . Marital Status: Not on file  Intimate Partner Violence:   . Fear of Current or Ex-Partner: Not on file  . Emotionally Abused: Not on file  . Physically Abused: Not on file  . Sexually Abused: Not on file   Social History   Tobacco Use  Smoking Status Former Smoker  . Types: Cigarettes  . Quit date: 12/30/1989  . Years since quitting: 29.4  Smokeless Tobacco Never Used   Social History   Substance and Sexual Activity  Alcohol Use Yes  . Alcohol/week: 7.0 standard drinks  . Types: 7 Cans of beer per week    Family History:  Family History  Problem Relation Age of Onset  . Breast cancer Mother   . Diabetes Mother   . Heart disease Mother   . Cancer Mother        breast  . Lung cancer Father   . Diabetes Father   . Cancer Father        lung  . Diabetes Sister     Past medical history, surgical history, medications, allergies, family history and social history reviewed with patient today and changes made to appropriate areas of the chart.   Review of Systems - negative All other ROS negative except what is listed above and in the HPI.      Objective:    BP 125/79 (BP Location: Right Arm, Cuff Size: Normal)   Pulse 69   Temp 97.8 F (36.6 C) (Oral)   Ht 6' 0.24" (1.835 m)   Wt 197 lb 9.6 oz (89.6 kg)   SpO2  97%   BMI 26.62 kg/m   Wt Readings from Last 3 Encounters:  05/29/19 197 lb 9.6 oz (89.6 kg)   02/06/19 196 lb (88.9 kg)  01/17/19 203 lb (92.1 kg)    Physical Exam Vitals and nursing note reviewed.  Constitutional:      General: He is awake. He is not in acute distress.    Appearance: He is well-developed. He is morbidly obese. He is not ill-appearing.  HENT:     Head: Normocephalic and atraumatic.     Right Ear: Hearing, tympanic membrane, ear canal and external ear normal. No drainage.     Left Ear: Hearing, tympanic membrane, ear canal and external ear normal. No drainage.     Nose: Nose normal.     Mouth/Throat:     Pharynx: Uvula midline.  Eyes:     General: Lids are normal.        Right eye: No discharge.        Left eye: No discharge.     Extraocular Movements: Extraocular movements intact.     Conjunctiva/sclera: Conjunctivae normal.     Pupils: Pupils are equal, round, and reactive to light.     Visual Fields: Right eye visual fields normal and left eye visual fields normal.  Neck:     Thyroid: No thyromegaly.     Vascular: No carotid bruit or JVD.     Trachea: Trachea normal.  Cardiovascular:     Rate and Rhythm: Normal rate and regular rhythm.     Heart sounds: Normal heart sounds, S1 normal and S2 normal. No murmur. No gallop.   Pulmonary:     Effort: Pulmonary effort is normal. No accessory muscle usage or respiratory distress.     Breath sounds: Normal breath sounds.  Abdominal:     General: Bowel sounds are normal.     Palpations: Abdomen is soft. There is no hepatomegaly or splenomegaly.     Tenderness: There is no abdominal tenderness.  Genitourinary:    Comments: Deferred per patient request Musculoskeletal:        General: Normal range of motion.     Cervical back: Normal range of motion and neck supple.     Right lower leg: No edema.     Left lower leg: No edema.  Lymphadenopathy:     Head:     Right side of head: No submental, submandibular, tonsillar, preauricular or posterior auricular adenopathy.     Left side of head: No submental,  submandibular, tonsillar, preauricular or posterior auricular adenopathy.     Cervical: No cervical adenopathy.  Skin:    General: Skin is warm and dry.     Capillary Refill: Capillary refill takes less than 2 seconds.     Findings: No rash.  Neurological:     Mental Status: He is alert and oriented to person, place, and time.     Cranial Nerves: Cranial nerves are intact.     Gait: Gait is intact.     Deep Tendon Reflexes: Reflexes are normal and symmetric.     Reflex Scores:      Brachioradialis reflexes are 2+ on the right side and 2+ on the left side.      Patellar reflexes are 2+ on the right side and 2+ on the left side. Psychiatric:        Attention and Perception: Attention normal.        Mood and Affect: Mood normal.        Speech: Speech  normal.        Behavior: Behavior normal. Behavior is cooperative.        Thought Content: Thought content normal.        Cognition and Memory: Cognition normal.        Judgment: Judgment normal.     Results for orders placed or performed in visit on 05/29/19  Bayer DCA Hb A1c Waived  Result Value Ref Range   HB A1C (BAYER DCA - WAIVED) 6.6 <7.0 %  Microalbumin, Urine Waived  Result Value Ref Range   Microalb, Ur Waived 10 0 - 19 mg/L   Creatinine, Urine Waived 50 10 - 300 mg/dL   Microalb/Creat Ratio <30 <30 mg/g      Assessment & Plan:   Problem List Items Addressed This Visit      Cardiovascular and Mediastinum   Hypertension associated with diabetes ([place])    Chronic, stable with BP at goal.  Continue current medication regimen and collaboration with cardiology.  Recommend checking BP at home at least a few mornings a week.  CMP today.      Relevant Orders   Bayer DCA Hb A1c Waived (Completed)   CBC with Differential/Platelet   Comprehensive metabolic panel   Systolic CHF (HCC)    Chronic, stable.  Continue current medication regiment and collaboration with cardiology.  CMP today.      Type 2 diabetes mellitus with  cardiac complication (HCC)    Chronic, ongoing with HF.  A1C today 6.6%.  Continue current medication regimen and adjust as needed + continue collaboration with cardiology.  Recommend he check BS at home at least a few mornings a week.  Return to office in 3 months.      Relevant Orders   Bayer DCA Hb A1c Waived (Completed)   Microalbumin, Urine Waived (Completed)   Comprehensive metabolic panel     Endocrine   Hyperlipidemia associated with type 2 diabetes mellitus (HCC)    Chronic, ongoing.  Continue current medication regimen and adjust as needed.  Lipid panel today.  Would benefit from statin use with diabetes and heart health.  Will discuss further upon return of labs.      Relevant Orders   Bayer DCA Hb A1c Waived (Completed)   Comprehensive metabolic panel   Lipid Panel w/o Chol/HDL Ratio   Hypothyroidism    Chronic, ongoing.  Continue current medication regimen and adjust as needed based on labs.  TSH and T4 today.      Relevant Orders   Thyroid Panel With TSH     Genitourinary   BPH (benign prostatic hyperplasia)    Stable, check PSA today.      Relevant Orders   PSA     Other   Biventricular implantable cardioverter-defibrillator in situ    Continue collaboration with cardiology.      Gout    No current medications or flares.  Check uric acid level today.      Relevant Orders   Uric acid    Other Visit Diagnoses    Annual physical exam    -  Primary   Check all annual labs and reviewed all preventative care, does not want colonoscopy at this time.      Discussed aspirin prophylaxis for myocardial infarction prevention and decision was made to continue ASA  LABORATORY TESTING:  Health maintenance labs ordered today as discussed above.   The natural history of prostate cancer and ongoing controversy regarding screening and potential treatment outcomes of prostate cancer  has been discussed with the patient. The meaning of a false positive PSA and a false  negative PSA has been discussed. He indicates understanding of the limitations of this screening test and wishes  to proceed with screening PSA testing.   IMMUNIZATIONS:   - Tdap: Tetanus vaccination status reviewed: last tetanus booster within 10 years. - Influenza: Refused - Pneumovax: Up to date - Prevnar: Not applicable - Zostavax vaccine: Refused  SCREENING: - Colonoscopy: Refused  Discussed with patient purpose of the colonoscopy is to detect colon cancer at curable precancerous or early stages   - AAA Screening: Not applicable  -Hearing Test: Not applicable  -Spirometry: Not applicable   PATIENT COUNSELING:    Sexuality: Discussed sexually transmitted diseases, partner selection, use of condoms, avoidance of unintended pregnancy  and contraceptive alternatives.   Advised to avoid cigarette smoking.  I discussed with the patient that most people either abstain from alcohol or drink within safe limits (<=14/week and <=4 drinks/occasion for males, <=7/weeks and <= 3 drinks/occasion for females) and that the risk for alcohol disorders and other health effects rises proportionally with the number of drinks per week and how often a drinker exceeds daily limits.  Discussed cessation/primary prevention of drug use and availability of treatment for abuse.   Diet: Encouraged to adjust caloric intake to maintain  or achieve ideal body weight, to reduce intake of dietary saturated fat and total fat, to limit sodium intake by avoiding high sodium foods and not adding table salt, and to maintain adequate dietary potassium and calcium preferably from fresh fruits, vegetables, and low-fat dairy products.    stressed the importance of regular exercise  Injury prevention: Discussed safety belts, safety helmets, smoke detector, smoking near bedding or upholstery.   Dental health: Discussed importance of regular tooth brushing, flossing, and dental visits.   Follow up plan: NEXT PREVENTATIVE  PHYSICAL DUE IN 1 YEAR. Return in about 3 months (around 08/27/2019) for T2DM, HTN/HLD.

## 2019-05-29 NOTE — Assessment & Plan Note (Signed)
Continue collaboration with cardiology.  

## 2019-05-29 NOTE — Assessment & Plan Note (Signed)
Chronic, ongoing.  Continue current medication regimen and adjust as needed based on labs.  TSH and T4 today.

## 2019-05-29 NOTE — Patient Instructions (Signed)
We are recommending the vaccine to everyone who has not had an allergic reaction to any of the components of the vaccine. If you have specific questions about the vaccine, please bring them up with your health care provider to discuss them.   We will likely not be getting the vaccine in the office for the first rounds of vaccinations. The way they are releasing the vaccines is going to be through the health systems (like [facility], [facility], [facility], [facility]) or through your county health department.   [facility] is giving vaccines to those 75+ starting 05/16/19  M-F 7AM to 4PM [facility] [address], [place], [place] [facility] in a drive through tent  If you are 65+ you can get a vaccine through [facility] by signing up for an appointment.  You can sign up by going to: [email or web address].  You can get more information by going to: [email or web address] 

## 2019-05-29 NOTE — Assessment & Plan Note (Signed)
Chronic, ongoing with HF.  A1C today 6.6%.  Continue current medication regimen and adjust as needed + continue collaboration with cardiology.  Recommend he check BS at home at least a few mornings a week.  Return to office in 3 months.

## 2019-05-29 NOTE — Assessment & Plan Note (Signed)
Chronic, stable with BP at goal.  Continue current medication regimen and collaboration with cardiology.  Recommend checking BP at home at least a few mornings a week.  CMP today.

## 2019-05-29 NOTE — Assessment & Plan Note (Signed)
No current medications or flares.  Check uric acid level today.

## 2019-05-30 LAB — COMPREHENSIVE METABOLIC PANEL
ALT: 24 IU/L (ref 0–44)
AST: 19 IU/L (ref 0–40)
Albumin/Globulin Ratio: 2 (ref 1.2–2.2)
Albumin: 4.8 g/dL (ref 3.8–4.8)
Alkaline Phosphatase: 49 IU/L (ref 39–117)
BUN/Creatinine Ratio: 14 (ref 10–24)
BUN: 18 mg/dL (ref 8–27)
Bilirubin Total: 0.9 mg/dL (ref 0.0–1.2)
CO2: 22 mmol/L (ref 20–29)
Calcium: 10.4 mg/dL — ABNORMAL HIGH (ref 8.6–10.2)
Chloride: 103 mmol/L (ref 96–106)
Creatinine, Ser: 1.29 mg/dL — ABNORMAL HIGH (ref 0.76–1.27)
GFR calc Af Amer: 65 mL/min/{1.73_m2} (ref >59)
GFR calc non Af Amer: 57 mL/min/{1.73_m2} — ABNORMAL LOW (ref >59)
Globulin, Total: 2.4 g/dL (ref 1.5–4.5)
Glucose: 180 mg/dL — ABNORMAL HIGH (ref 65–99)
Potassium: 4.2 mmol/L (ref 3.5–5.2)
Sodium: 139 mmol/L (ref 134–144)
Total Protein: 7.2 g/dL (ref 6.0–8.5)

## 2019-05-30 LAB — CBC WITH DIFFERENTIAL/PLATELET
Basophils Absolute: 0.1 10*3/uL (ref 0.0–0.2)
Basos: 1 %
EOS (ABSOLUTE): 0.2 10*3/uL (ref 0.0–0.4)
Eos: 3 %
Hematocrit: 47.4 % (ref 37.5–51.0)
Hemoglobin: 16.6 g/dL (ref 13.0–17.7)
Immature Grans (Abs): 0 10*3/uL (ref 0.0–0.1)
Immature Granulocytes: 0 %
Lymphocytes Absolute: 1.9 10*3/uL (ref 0.7–3.1)
Lymphs: 27 %
MCH: 31.9 pg (ref 26.6–33.0)
MCHC: 35 g/dL (ref 31.5–35.7)
MCV: 91 fL (ref 79–97)
Monocytes Absolute: 0.6 10*3/uL (ref 0.1–0.9)
Monocytes: 8 %
Neutrophils Absolute: 4.2 10*3/uL (ref 1.4–7.0)
Neutrophils: 61 %
Platelets: 197 10*3/uL (ref 150–450)
RBC: 5.21 x10E6/uL (ref 4.14–5.80)
RDW: 12.6 % (ref 11.6–15.4)
WBC: 7.1 10*3/uL (ref 3.4–10.8)

## 2019-05-30 LAB — LIPID PANEL W/O CHOL/HDL RATIO
Cholesterol, Total: 145 mg/dL (ref 100–199)
HDL: 37 mg/dL — ABNORMAL LOW (ref >39)
LDL Chol Calc (NIH): 79 mg/dL (ref 0–99)
Triglycerides: 166 mg/dL — ABNORMAL HIGH (ref 0–149)
VLDL Cholesterol Cal: 29 mg/dL (ref 5–40)

## 2019-05-30 LAB — THYROID PANEL WITH TSH
Free Thyroxine Index: 2 (ref 1.2–4.9)
T3 Uptake Ratio: 29 % (ref 24–39)
T4, Total: 6.8 ug/dL (ref 4.5–12.0)
TSH: 3.5 u[IU]/mL (ref 0.450–4.500)

## 2019-05-30 LAB — PSA: Prostate Specific Ag, Serum: 1.3 ng/mL (ref 0.0–4.0)

## 2019-05-30 LAB — URIC ACID: Uric Acid: 3.8 mg/dL (ref 3.8–8.4)

## 2019-05-30 NOTE — Progress Notes (Signed)
Contacted via MyChart

## 2019-06-12 ENCOUNTER — Other Ambulatory Visit: Payer: Self-pay

## 2019-06-12 DIAGNOSIS — E039 Hypothyroidism, unspecified: Secondary | ICD-10-CM

## 2019-06-12 MED ORDER — LEVOTHYROXINE SODIUM 50 MCG PO TABS: 50.0000 ug | ORAL_TABLET | Freq: Every day | ORAL | 3 refills | Status: DC

## 2019-06-12 NOTE — Telephone Encounter (Signed)
Medication Refill LOV: 05/29/19 Next: 08/29/19

## 2019-06-18 ENCOUNTER — Other Ambulatory Visit: Payer: Self-pay

## 2019-06-18 DIAGNOSIS — M1000 Idiopathic gout, unspecified site: Secondary | ICD-10-CM

## 2019-06-18 MED ORDER — FEBUXOSTAT 40 MG PO TABS: 40.0000 mg | ORAL_TABLET | Freq: Every day | ORAL | 4 refills | Status: DC

## 2019-06-18 NOTE — Telephone Encounter (Signed)
Refill request for Uloric LOV: 05/29/2019 Next Appt: 08/29/19

## 2019-07-10 ENCOUNTER — Other Ambulatory Visit: Payer: Self-pay | Admitting: Internal Medicine

## 2019-08-01 ENCOUNTER — Ambulatory Visit (INDEPENDENT_AMBULATORY_CARE_PROVIDER_SITE_OTHER): Payer: Medicare Other | Admitting: *Deleted

## 2019-08-01 DIAGNOSIS — Z95810 Presence of automatic (implantable) cardiac defibrillator: Secondary | ICD-10-CM

## 2019-08-01 LAB — CUP PACEART REMOTE DEVICE CHECK
Battery Remaining Longevity: 48 mo
Battery Voltage: 2.97 V
Brady Statistic AP VP Percent: 16.11 %
Brady Statistic AP VS Percent: 0.01 %
Brady Statistic AS VP Percent: 83.88 %
Brady Statistic AS VS Percent: 0 %
Brady Statistic RA Percent Paced: 16.11 %
Brady Statistic RV Percent Paced: 99.98 %
Date Time Interrogation Session: 20210324031805
HighPow Impedance: 49 Ohm
HighPow Impedance: 57 Ohm
Implantable Lead Implant Date: 20060505
Implantable Lead Implant Date: 20120111
Implantable Lead Implant Date: 20180314
Implantable Lead Location: 753858
Implantable Lead Location: 753859
Implantable Lead Location: 753860
Implantable Lead Model: 5076
Implantable Lead Model: 6947
Implantable Pulse Generator Implant Date: 20180314
Lead Channel Impedance Value: 241.412
Lead Channel Impedance Value: 246.635
Lead Channel Impedance Value: 249.509
Lead Channel Impedance Value: 255.093
Lead Channel Impedance Value: 265.661
Lead Channel Impedance Value: 418 Ohm
Lead Channel Impedance Value: 456 Ohm
Lead Channel Impedance Value: 475 Ohm
Lead Channel Impedance Value: 513 Ohm
Lead Channel Impedance Value: 551 Ohm
Lead Channel Impedance Value: 551 Ohm
Lead Channel Impedance Value: 608 Ohm
Lead Channel Impedance Value: 760 Ohm
Lead Channel Impedance Value: 817 Ohm
Lead Channel Impedance Value: 836 Ohm
Lead Channel Impedance Value: 893 Ohm
Lead Channel Impedance Value: 950 Ohm
Lead Channel Impedance Value: 988 Ohm
Lead Channel Pacing Threshold Amplitude: 0.5 V
Lead Channel Pacing Threshold Amplitude: 0.625 V
Lead Channel Pacing Threshold Amplitude: 1.375 V
Lead Channel Pacing Threshold Pulse Width: 0.4 ms
Lead Channel Pacing Threshold Pulse Width: 0.4 ms
Lead Channel Pacing Threshold Pulse Width: 0.4 ms
Lead Channel Sensing Intrinsic Amplitude: 0.625 mV
Lead Channel Sensing Intrinsic Amplitude: 0.625 mV
Lead Channel Setting Pacing Amplitude: 1.25 V
Lead Channel Setting Pacing Amplitude: 1.5 V
Lead Channel Setting Pacing Amplitude: 2 V
Lead Channel Setting Pacing Pulse Width: 0.4 ms
Lead Channel Setting Pacing Pulse Width: 0.4 ms
Lead Channel Setting Sensing Sensitivity: 0.3 mV

## 2019-08-02 NOTE — Progress Notes (Signed)
ICD Remote  

## 2019-08-03 ENCOUNTER — Other Ambulatory Visit: Payer: Self-pay | Admitting: Family Medicine

## 2019-08-03 ENCOUNTER — Other Ambulatory Visit: Payer: Self-pay

## 2019-08-03 DIAGNOSIS — I428 Other cardiomyopathies: Secondary | ICD-10-CM

## 2019-08-03 DIAGNOSIS — I429 Cardiomyopathy, unspecified: Secondary | ICD-10-CM

## 2019-08-03 MED ORDER — CANAGLIFLOZIN 100 MG PO TABS: 100.0000 mg | ORAL_TABLET | Freq: Every day | ORAL | 4 refills | Status: DC

## 2019-08-03 MED ORDER — DEXILANT 60 MG PO CPDR: 60.0000 mg | DELAYED_RELEASE_CAPSULE | Freq: Every day | ORAL | 4 refills | Status: DC

## 2019-08-03 NOTE — Telephone Encounter (Signed)
Patient last seen 05/29/19 and has appointment 08/29/19

## 2019-08-15 ENCOUNTER — Telehealth: Payer: Self-pay

## 2019-08-15 NOTE — Telephone Encounter (Signed)
**Note De-Identified Ryan [name] Obfuscation** Per request from [employer] (letter) I started an [place] PA through covermymed. Key: [id number]  I did contact [place] to get INS info as follows: [employer] ID: [id number] BIN: [id number] PCN: [id number] GRP: [id number]

## 2019-08-27 ENCOUNTER — Telehealth: Payer: Self-pay | Admitting: Internal Medicine

## 2019-08-27 NOTE — Telephone Encounter (Signed)
Please call regarding Ryan [name], patient states he is having a hard time getting this.

## 2019-08-29 ENCOUNTER — Encounter: Payer: Self-pay | Admitting: Nurse Practitioner

## 2019-08-29 ENCOUNTER — Other Ambulatory Visit: Payer: Self-pay

## 2019-08-29 ENCOUNTER — Ambulatory Visit (INDEPENDENT_AMBULATORY_CARE_PROVIDER_SITE_OTHER): Payer: Medicare Other | Admitting: Nurse Practitioner

## 2019-08-29 ENCOUNTER — Ambulatory Visit (INDEPENDENT_AMBULATORY_CARE_PROVIDER_SITE_OTHER): Payer: Medicare Other | Admitting: Pharmacist

## 2019-08-29 VITALS — BP 118/78 | HR 76 | Temp 97.9°F | Wt 200.4 lb

## 2019-08-29 DIAGNOSIS — I5020 Unspecified systolic (congestive) heart failure: Secondary | ICD-10-CM

## 2019-08-29 DIAGNOSIS — E1169 Type 2 diabetes mellitus with other specified complication: Secondary | ICD-10-CM

## 2019-08-29 DIAGNOSIS — E1159 Type 2 diabetes mellitus with other circulatory complications: Secondary | ICD-10-CM

## 2019-08-29 DIAGNOSIS — E785 Hyperlipidemia, unspecified: Secondary | ICD-10-CM

## 2019-08-29 DIAGNOSIS — I5022 Chronic systolic (congestive) heart failure: Secondary | ICD-10-CM | POA: Diagnosis not present

## 2019-08-29 DIAGNOSIS — I10 Essential (primary) hypertension: Secondary | ICD-10-CM | POA: Diagnosis not present

## 2019-08-29 LAB — BAYER DCA HB A1C WAIVED: HB A1C (BAYER DCA - WAIVED): 7.1 % — ABNORMAL HIGH (ref <7.0)

## 2019-08-29 NOTE — Assessment & Plan Note (Signed)
Chronic, stable with BP at goal on recheck today and on home readings.  Continue current medication regimen and collaboration with cardiology.  Recommend checking BP at home at least a few mornings a week.  BMP today.

## 2019-08-29 NOTE — Assessment & Plan Note (Signed)
Chronic, ongoing with HF.  A1C today 7.1%, slight trend upwards.  Continue current medication regimen and adjust as needed + continue collaboration with cardiology.  Recommend he check BS at home at least a few mornings a week.  He is to focus on diet heavily over next three months to reach A1C goal <7.  Return to office in 3 months.

## 2019-08-29 NOTE — Patient Instructions (Signed)
Visit Information  Goals Addressed            This Visit's Progress     Patient Stated   . PharmD "I can't afford this medication" (pt-stated)       CARE PLAN ENTRY (see longtitudinal plan of care for additional care plan information)  Current Barriers:  . Polypharmacy; complex patient with multiple comorbidities including hx HFrEF, T2DM, HTN, HLD . Reports that he received notice from his insurance company that [employer] was not covered. Unsure if this was before or after PA was started by Dr. Olin Pia office  . Most recent eGFR: 57 mL/min o HFrEF: Entresto 24/26 mg BID, carvedilol 12.5 mg BID, spironolactone 25 mg daily o T2DM/HFrEF: Invokana 100 mg daily; A1c today 7.1%.  o ASCVD risk reduction: 10 year ASCVD risk score 27%. Hx statin intolerance to pravastatin and rosuvastatin w/ significant joint pain. o Gout: febuxostat 40 mg daily, last uric acid at goal <6 o Hypothyroidism: levothyroxine 50 mcg daily o GERD: Dexlansoprazole 60 mg daily .   Pharmacist Clinical Goal(s):  Marland Kitchen Over the next 90 days, patient will work with PharmD and provider towards optimized medication management  Interventions: . Comprehensive medication review performed; medication list updated in electronic medical record . Inter-disciplinary care team collaboration (see longitudinal plan of care) . Reviewed definition of HF and brief overview of mechanism of action of each medication . Discussed income. Patient will qualify for [facility] assistance from [employer]. Completed patient portion of application together in the office; he will bring back copy of 2020 tax return. Will collaborate w/ CPhT to fax provider portion to Dr. Olin Pia office  . Moving forward, could consider maximizing Invokana to 300 mg daily . Moving forward, will address HLD treatment. Could consider ezetimibe vs PCSK9, as patient's LDL is close to goal <70 given DM + risk factor of CKD and ezetimibe may be able to get to goal.   Patient  Self Care Activities:  . Patient will take medications as prescribed  Initial goal documentation        Patient verbalizes understanding of instructions provided today.   Plan: - Will collaborate w/ patient and CPhT as above - Scheduled f/u call 10/03/19  Catie Darnelle Maffucci, PharmD, [place] [phone]

## 2019-08-29 NOTE — Chronic Care Management (AMB) (Signed)
Chronic Care Management   Note  08/29/2019 Name: TIMO HARTWIG [name]: [id number] DOB: 1951/03/19   Subjective:  MERICK KELLEHER is a 69 y.o. year old male who is a primary care patient of Cannady, Barbaraann Faster, NP. The CCM team was consulted for assistance with chronic disease management and care coordination needs.     Mr. Poffenberger was given information about Chronic Care Management services today including:  1. CCM service includes personalized support from designated clinical staff supervised by his physician, including individualized plan of care and coordination with other care providers 2. 24/7 contact phone numbers for assistance for urgent and routine care needs. 3. Service will only be billed when office clinical staff spend 20 minutes or more in a month to coordinate care. 4. Only one practitioner may furnish and bill the service in a calendar month. 5. The patient may stop CCM services at any time (effective at the end of the month) by phone call to the office staff. 6. The patient will be responsible for cost sharing (co-pay) of up to 20% of the service fee (after annual deductible is met).  Patient agreed to services and verbal consent obtained.   Review of patient status, including review of consultants reports, laboratory and other test data, was performed as part of comprehensive evaluation and provision of chronic care management services.   SDOH (Social Determinants of Health) assessments and interventions performed:  yes  Objective:  Lab Results  Component Value Date   CREATININE 1.29 (H) 05/29/2019   CREATININE 1.20 01/10/2019   CREATININE 1.37 (H) 05/24/2018    Lab Results  Component Value Date   HGBA1C 6.6 05/29/2019       Component Value Date/Time   CHOL 145 05/29/2019 1005   CHOL 134 01/10/2019 1055   TRIG 166 (H) 05/29/2019 1005   TRIG 260 (H) 01/10/2019 1055   HDL 37 (L) 05/29/2019 1005   CHOLHDL 4.0 05/24/2018 1114   VLDL 52 (H) 01/10/2019 1055   LDLCALC  79 05/29/2019 1005    Clinical ASCVD: No  The 10-year ASCVD risk score Mikey Bussing DC Jr., et al., 2013) is: 27.6%   Values used to calculate the score:     Age: 46 years     Sex: Male     Is Non-Hispanic African American: No     Diabetic: Yes     Tobacco smoker: No     Systolic Blood Pressure: 449 mmHg     Is BP treated: Yes     HDL Cholesterol: 37 mg/dL     Total Cholesterol: 145 mg/dL    BP Readings from Last 3 Encounters:  08/29/19 118/78  05/29/19 125/79  02/06/19 124/70    Allergies  Allergen Reactions  . Ace Inhibitors     Muscle aches   . Crestor [Rosuvastatin Calcium] Other (See Comments)    Muscle aches    Medications Reviewed Today    Reviewed by De Hollingshead, [facility] (Pharmacist) on 08/29/19 at 1017  Med List Status: <None>  Medication Order Taking? Sig Documenting Provider Last Dose Status Informant  canagliflozin (INVOKANA) 100 MG TABS tablet [id number] Yes Take 1 tablet (100 mg total) by mouth daily before breakfast. [name] T, NP Taking Active   carvedilol (COREG) 12.5 MG tablet [id number] Yes Take 1 tablet (12.5 mg total) by mouth 2 (two) times daily. Deboraha Sprang, MD Taking Active   Cholecalciferol (VITAMIN D3 SUPER STRENGTH) 50 MCG (2000 UT) CAPS [id number] Yes Take 2,000 Units by mouth  daily. [provider] Taking Active   dexlansoprazole (DEXILANT) 60 MG capsule [id number] Yes Take 1 capsule (60 mg total) by mouth daily. [name] T, NP Taking Active   ENTRESTO 24-26 [place] [id number] Yes Take 1 tablet by mouth 2 (two) times daily. Deboraha Sprang, MD Taking Active   febuxostat (ULORIC) 40 MG tablet [id number] Yes Take 1 tablet (40 mg total) by mouth daily. [name] T, NP Taking Active   levothyroxine (SYNTHROID) 50 MCG tablet [id number] Yes Take 1 tablet (50 mcg total) by mouth daily. [name] T, NP Taking Active   spironolactone (ALDACTONE) 25 MG tablet [id number] Yes Take 1 tablet (25 mg total) by mouth daily. Valerie Roys,  DO Taking Active         Discontinued 08/29/19 1017 (Completed Course)            Assessment:   Goals Addressed            This Visit's Progress     Patient Stated   . PharmD "I can't afford this medication" (pt-stated)       CARE PLAN ENTRY (see longtitudinal plan of care for additional care plan information)  Current Barriers:  . Polypharmacy; complex patient with multiple comorbidities including hx HFrEF, T2DM, HTN, HLD . Reports that he received notice from his insurance company that [place] was not covered. Unsure if this was before or after PA was started by Dr. Olin Pia office  . Most recent eGFR: 57 mL/min o HFrEF: Entresto 24/26 mg BID, carvedilol 12.5 mg BID, spironolactone 25 mg daily o T2DM/HFrEF: Invokana 100 mg daily; A1c today 7.1%.  o ASCVD risk reduction: 10 year ASCVD risk score 27%. Hx statin intolerance to pravastatin and rosuvastatin w/ significant joint pain. o Gout: febuxostat 40 mg daily, last uric acid at goal <6 o Hypothyroidism: levothyroxine 50 mcg daily o GERD: Dexlansoprazole 60 mg daily .   Pharmacist Clinical Goal(s):  Marland Kitchen Over the next 90 days, patient will work with PharmD and provider towards optimized medication management  Interventions: . Comprehensive medication review performed; medication list updated in electronic medical record . Inter-disciplinary care team collaboration (see longitudinal plan of care) . Reviewed definition of HF and brief overview of mechanism of action of each medication . Discussed income. Patient will qualify for [facility] assistance from [employer]. Completed patient portion of application together in the office; he will bring back copy of 2020 tax return. Will collaborate w/ CPhT to fax provider portion to Dr. Olin Pia office  . Moving forward, could consider maximizing Invokana to 300 mg daily . Moving forward, will address HLD treatment. Could consider ezetimibe vs PCSK9, as patient's LDL is close to goal <70  given DM + risk factor of CKD and ezetimibe may be able to get to goal.   Patient Self Care Activities:  . Patient will take medications as prescribed  Initial goal documentation        Plan: - Will collaborate w/ patient and CPhT as above - Scheduled f/u call 10/03/19  Catie Darnelle Maffucci, PharmD, [place] [phone]

## 2019-08-29 NOTE — Assessment & Plan Note (Signed)
Chronic, ongoing.  Continue current medication regimen and adjust as needed.  Would benefit from statin use with diabetes and heart health or consider injectable, discussed with him today.  LDL last visit was 79.  Lipid panel next visit.

## 2019-08-29 NOTE — Assessment & Plan Note (Signed)
Chronic, stable.  Continue current medication regiment and collaboration with cardiology.  BMP today.  CCM referral due to [facility] cost.  Recommend: - Reminded to call for an overnight weight gain of >2 pounds or a weekly weight weight of >5 pounds - not adding salt to his food and has been reading food labels. Reviewed the importance of keeping daily sodium intake to 2000mg  daily

## 2019-08-29 NOTE — Progress Notes (Signed)
BP 118/78 (BP Location: Left Arm)   Pulse 76   Temp 97.9 F (36.6 C) (Oral)   Wt 200 lb 6.4 oz (90.9 kg)   SpO2 95%   BMI 27.00 kg/m    Subjective:    Patient ID: Ryan [name], male    DOB: 02-15-1951, 69 y.o.   [name]: [id number]  HPI: Ryan [name] is a 69 y.o. male  Chief Complaint  Patient presents with  . Diabetes  . Hypertension  . Hyperlipidemia   DIABETES Last A1C 6.6% January, down from previous 7.3%.  Takes Invokana.  Had intolerance to Metformin. Hypoglycemic episodes:no Polydipsia/polyuria: no Visual disturbance: no Chest pain: no Paresthesias: no Glucose Monitoring: no             Accucheck frequency: Not Checking             Fasting glucose:             Post prandial:             Evening:             Before meals: Taking Insulin?: no             Long acting insulin:             Short acting insulin: Blood Pressure Monitoring: daily Retinal Examination: Up to Date Foot Exam: Up to Date Pneumovax: took several years Influenza: refused Aspirin: no   HYPERTENSION / HYPERLIPIDEMIA/HF Is followed by cardiology, Dr. Caryl Comes, last saw him in September and had cardio-defib implantable check in March.  Continues on Aldactone, Entresto, Coreg.  No statin, reports cardiology took him off of this.  Tried Pravastatin in past with poor response, with terrible joint pain.  He reports having a hard time with the Entresto, costing $600 a month, they covered this for 2 years and now they are not going to. Satisfied with current treatment? yes Duration of hypertension: chronic BP monitoring frequency: a few times a week BP range: 110/70 on average BP medication side effects: no Duration of hyperlipidemia: chronic Medication compliance: good compliance Aspirin: no Recent stressors: no Recurrent headaches: no Visual changes: no Palpitations: no Dyspnea: no Chest pain: no Lower extremity edema: no Dizzy/lightheaded: no  The 10-year ASCVD risk score Mikey Bussing DC Jr.,  et al., 2013) is: 27.6%   Values used to calculate the score:     Age: 89 years     Sex: Male     Is Non-Hispanic African American: No     Diabetic: Yes     Tobacco smoker: No     Systolic Blood Pressure: 123456 mmHg     Is BP treated: Yes     HDL Cholesterol: 37 mg/dL     Total Cholesterol: 145 mg/dL   Relevant past medical, surgical, family and social history reviewed and updated as indicated. Interim medical history since our last visit reviewed. Allergies and medications reviewed and updated.  Review of Systems  Constitutional: Negative for activity change, diaphoresis, fatigue and fever.  Respiratory: Negative for cough, chest tightness, shortness of breath and wheezing.   Cardiovascular: Negative for chest pain, palpitations and leg swelling.  Gastrointestinal: Negative.   Endocrine: Negative for polydipsia, polyphagia and polyuria.  Neurological: Negative.   Psychiatric/Behavioral: Negative.     Per HPI unless specifically indicated above     Objective:    BP 118/78 (BP Location: Left Arm)   Pulse 76   Temp 97.9 F (36.6 C) (Oral)   Wt 200  lb 6.4 oz (90.9 kg)   SpO2 95%   BMI 27.00 kg/m   Wt Readings from Last 3 Encounters:  08/29/19 200 lb 6.4 oz (90.9 kg)  05/29/19 197 lb 9.6 oz (89.6 kg)  02/06/19 196 lb (88.9 kg)    Physical Exam Vitals and nursing note reviewed.  Constitutional:      General: He is awake. He is not in acute distress.    Appearance: He is well-developed and well-groomed. He is not ill-appearing.  HENT:     Head: Normocephalic and atraumatic.     Right Ear: Hearing normal. No drainage.     Left Ear: Hearing normal. No drainage.  Eyes:     General: Lids are normal.        Right eye: No discharge.        Left eye: No discharge.     Conjunctiva/sclera: Conjunctivae normal.     Pupils: Pupils are equal, round, and reactive to light.  Neck:     Vascular: No carotid bruit.  Cardiovascular:     Rate and Rhythm: Normal rate and regular  rhythm.     Heart sounds: Normal heart sounds, S1 normal and S2 normal. No murmur. No gallop.   Pulmonary:     Effort: Pulmonary effort is normal. No accessory muscle usage or respiratory distress.     Breath sounds: Normal breath sounds.  Abdominal:     General: Bowel sounds are normal.     Palpations: Abdomen is soft.  Musculoskeletal:        General: Normal range of motion.     Cervical back: Normal range of motion and neck supple.     Right lower leg: No edema.     Left lower leg: No edema.  Skin:    General: Skin is warm and dry.     Capillary Refill: Capillary refill takes less than 2 seconds.  Neurological:     Mental Status: He is alert and oriented to person, place, and time.  Psychiatric:        Attention and Perception: Attention normal.        Mood and Affect: Mood normal.        Speech: Speech normal.        Behavior: Behavior normal. Behavior is cooperative.        Thought Content: Thought content normal.     Results for orders placed or performed in visit on 08/01/19  CUP PACEART REMOTE DEVICE CHECK  Result Value Ref Range   Date Time Interrogation Session [id number]    Pulse Generator Manufacturer MERM    Pulse Gen Model Payette MRI Quad CRT-D    Pulse Gen Serial Number [id number] [facility] Name [place] Pulse Generator Type Cardiac Resynch Therapy Defibulator    Implantable Pulse Generator Implant Date [id number]    Implantable Lead Manufacturer [employer] Lead Serial Number [id number]    Implantable Lead Implant Date SF:4463482    Implantable Lead Location Detail 1 UNKNOWN    Implantable Lead Location [id number]    Implantable Lead Manufacturer [facility]    Implantable Lead Model [id number] Sprint Quattro Secure    Implantable Lead Serial Number [id number]    Implantable Lead Implant Date [id number]:[id number]    Implantable Lead Location [id number]    Implantable Lead Manufacturer MERM    Implantable Lead  Model 5076 CapSureFix Novus    Implantable Lead Serial Number  [id number]    Implantable Lead Implant Date WN:[id number]    Implantable Lead Location [phone]    Lead Channel Setting Sensing Sensitivity 0.3 mV   Lead Channel Setting Pacing Amplitude 1.25 V   Lead Channel Setting Pacing Pulse Width 0.4 ms   Lead Channel Setting Pacing Amplitude 1.5 V   Lead Channel Setting Pacing Pulse Width 0.4 ms   Lead Channel Setting Pacing Amplitude 2 V   Lead Channel Setting Pacing Capture Mode Monitor Capture    Lead Channel Impedance Value 418 ohm   Lead Channel Sensing Intrinsic Amplitude 0.625 mV   Lead Channel Sensing Intrinsic Amplitude 0.625 mV   Lead Channel Pacing Threshold Amplitude 0.625 V   Lead Channel Pacing Threshold Pulse Width 0.4 ms   Lead Channel Impedance Value 608 ohm   Lead Channel Impedance Value 551 ohm   Lead Channel Pacing Threshold Amplitude 0.5 V   Lead Channel Pacing Threshold Pulse Width 0.4 ms   HighPow Impedance 49 ohm   HighPow Impedance 57 ohm   Lead Channel Impedance Value 893 ohm   Lead Channel Impedance Value 988 ohm   Lead Channel Impedance Value 950 ohm   Lead Channel Impedance Value 760 ohm   Lead Channel Impedance Value 817 ohm   Lead Channel Impedance Value 836 ohm   Lead Channel Impedance Value 551 ohm   Lead Channel Impedance Value 456 ohm   Lead Channel Impedance Value 475 ohm   Lead Channel Impedance Value 513 ohm   Lead Channel Impedance Value 249.509    Lead Channel Impedance Value 255.093    Lead Channel Impedance Value 265.661    Lead Channel Impedance Value 241.412    Lead Channel Impedance Value 246.635    Lead Channel Pacing Threshold Amplitude 1.375 V   Lead Channel Pacing Threshold Pulse Width 0.4 ms   Battery Status OK    Battery Remaining Longevity 48 mo   Battery Voltage 2.97 V   Brady Statistic RA Percent Paced 16.11 %   Brady Statistic RV Percent Paced 99.98 %   Brady Statistic AP VP Percent 16.11 %   Brady Statistic AS VP Percent  83.88 %   Brady Statistic AP VS Percent 0.01 %   Brady Statistic AS VS Percent 0 %      Assessment & Plan:   Problem List Items Addressed This Visit      Cardiovascular and Mediastinum   Hypertension associated with diabetes ([place])    Chronic, stable with BP at goal on recheck today and on home readings.  Continue current medication regimen and collaboration with cardiology.  Recommend checking BP at home at least a few mornings a week.  BMP today.      Systolic CHF (HCC)    Chronic, stable.  Continue current medication regiment and collaboration with cardiology.  BMP today.  CCM referral due to [facility] cost.  Recommend: - Reminded to call for an overnight weight gain of >2 pounds or a weekly weight weight of >5 pounds - not adding salt to his food and has been reading food labels. Reviewed the importance of keeping daily sodium intake to 2000mg  daily       Relevant Orders   Referral to Chronic Care Management Services   Type 2 diabetes mellitus with cardiac complication (HCC) - Primary    Chronic, ongoing with HF.  A1C today 7.1%, slight trend upwards.  Continue current medication regimen and adjust as needed + continue collaboration with cardiology.  Recommend he check BS  at home at least a few mornings a week.  He is to focus on diet heavily over next three months to reach A1C goal <7.  Return to office in 3 months.      Relevant Orders   Basic metabolic panel   [facility] [place] Hb A1c Waived   Referral to Chronic Care Management Services     Endocrine   Hyperlipidemia associated with type 2 diabetes mellitus (HCC)    Chronic, ongoing.  Continue current medication regimen and adjust as needed.  Would benefit from statin use with diabetes and heart health or consider injectable, discussed with him today.  LDL last visit was 79.  Lipid panel next visit.          Follow up plan: Return in about 3 months (around 11/28/2019) for T2DM, HTN/HLD.

## 2019-08-29 NOTE — Telephone Encounter (Signed)
De Hollingshead, RPH  [name] Filbert, RN  Michole Lecuyer,  I met this patient at his PCP today, and am helping facilitate [facility] patient assistance. We will be faxing the provider form to y'all for Dr. Olin Pia signature. Thanks!

## 2019-08-29 NOTE — Progress Notes (Deleted)
Clinical ASCVD: {YES/NO:21197} The 10-year ASCVD risk score Mikey Bussing DC Jr., et al., 2013) is: 27.6%   Values used to calculate the score:     Age: 69 years     Sex: Male     Is Non-Hispanic African American: No     Diabetic: Yes     Tobacco smoker: No     Systolic Blood Pressure: 123456 mmHg     Is BP treated: Yes     HDL Cholesterol: 37 mg/dL     Total Cholesterol: 145 mg/dL

## 2019-08-29 NOTE — Patient Instructions (Signed)
Carbohydrate Counting for Diabetes Mellitus, Adult  Carbohydrate counting is a method of keeping track of how many carbohydrates you eat. Eating carbohydrates naturally increases the amount of sugar (glucose) in the blood. Counting how many carbohydrates you eat helps keep your blood glucose within normal limits, which helps you manage your diabetes (diabetes mellitus). It is important to know how many carbohydrates you can safely have in each meal. This is different for every person. A diet and nutrition specialist (registered dietitian) can help you make a meal plan and calculate how many carbohydrates you should have at each meal and snack. Carbohydrates are found in the following foods:  Grains, such as breads and cereals.  Dried beans and soy products.  Starchy vegetables, such as potatoes, peas, and corn.  Fruit and fruit juices.  Milk and yogurt.  Sweets and snack foods, such as cake, cookies, candy, chips, and soft drinks. How do I count carbohydrates? There are two ways to count carbohydrates in food. You can use either of the methods or a combination of both. Reading "Nutrition Facts" on packaged food The "Nutrition Facts" list is included on the labels of almost all packaged foods and beverages in the [place] It includes:  The serving size.  Information about nutrients in each serving, including the grams (g) of carbohydrate per serving. To use the "Nutrition Facts":  Decide how many servings you will have.  Multiply the number of servings by the number of carbohydrates per serving.  The resulting number is the total amount of carbohydrates that you will be having. Learning standard serving sizes of other foods When you eat carbohydrate foods that are not packaged or do not include "Nutrition Facts" on the label, you need to measure the servings in order to count the amount of carbohydrates:  Measure the foods that you will eat with a food scale or measuring cup, if  needed.  Decide how many standard-size servings you will eat.  Multiply the number of servings by 15. Most carbohydrate-rich foods have about 15 g of carbohydrates per serving. ? For example, if you eat 8 oz (170 g) of strawberries, you will have eaten 2 servings and 30 g of carbohydrates (2 servings x 15 g = 30 g).  For foods that have more than one food mixed, such as soups and casseroles, you must count the carbohydrates in each food that is included. The following list contains standard serving sizes of common carbohydrate-rich foods. Each of these servings has about 15 g of carbohydrates:   hamburger bun or  English muffin.   oz (15 mL) syrup.   oz (14 g) jelly.  1 slice of bread.  1 six-inch tortilla.  3 oz (85 g) cooked rice or pasta.  4 oz (113 g) cooked dried beans.  4 oz (113 g) starchy vegetable, such as peas, corn, or potatoes.  4 oz (113 g) hot cereal.  4 oz (113 g) mashed potatoes or  of a large baked potato.  4 oz (113 g) canned or frozen fruit.  4 oz (120 mL) fruit juice.  4-6 crackers.  6 chicken nuggets.  6 oz (170 g) unsweetened dry cereal.  6 oz (170 g) plain fat-free yogurt or yogurt sweetened with artificial sweeteners.  8 oz (240 mL) milk.  8 oz (170 g) fresh fruit or one small piece of fruit.  24 oz (680 g) popped popcorn. Example of carbohydrate counting Sample meal  3 oz (85 g) chicken breast.  6 oz (170 g)   brown rice.  4 oz (113 g) corn.  8 oz (240 mL) milk.  8 oz (170 g) strawberries with sugar-free whipped topping. Carbohydrate calculation 1. Identify the foods that contain carbohydrates: ? Rice. ? Corn. ? Milk. ? Strawberries. 2. Calculate how many servings you have of each food: ? 2 servings rice. ? 1 serving corn. ? 1 serving milk. ? 1 serving strawberries. 3. Multiply each number of servings by 15 g: ? 2 servings rice x 15 g = 30 g. ? 1 serving corn x 15 g = 15 g. ? 1 serving milk x 15 g = 15 g. ? 1  serving strawberries x 15 g = 15 g. 4. Add together all of the amounts to find the total grams of carbohydrates eaten: ? 30 g + 15 g + 15 g + 15 g = 75 g of carbohydrates total. Summary  Carbohydrate counting is a method of keeping track of how many carbohydrates you eat.  Eating carbohydrates naturally increases the amount of sugar (glucose) in the blood.  Counting how many carbohydrates you eat helps keep your blood glucose within normal limits, which helps you manage your diabetes.  A diet and nutrition specialist (registered dietitian) can help you make a meal plan and calculate how many carbohydrates you should have at each meal and snack. This information is not intended to replace advice given to you by your health care provider. Make sure you discuss any questions you have with your health care provider. Document Revised: 11/18/2016 Document Reviewed: 10/08/2015 Elsevier Patient Education  2020 Elsevier Inc.  

## 2019-08-30 ENCOUNTER — Other Ambulatory Visit: Payer: Self-pay | Admitting: Pharmacy Technician

## 2019-08-30 ENCOUNTER — Encounter: Payer: Self-pay | Admitting: Nurse Practitioner

## 2019-08-30 DIAGNOSIS — E1122 Type 2 diabetes mellitus with diabetic chronic kidney disease: Secondary | ICD-10-CM | POA: Insufficient documentation

## 2019-08-30 DIAGNOSIS — N183 Chronic kidney disease, stage 3 unspecified: Secondary | ICD-10-CM | POA: Insufficient documentation

## 2019-08-30 LAB — BASIC METABOLIC PANEL
BUN/Creatinine Ratio: 15 (ref 10–24)
BUN: 22 mg/dL (ref 8–27)
CO2: 23 mmol/L (ref 20–29)
Calcium: 9.8 mg/dL (ref 8.6–10.2)
Chloride: 102 mmol/L (ref 96–106)
Creatinine, Ser: 1.42 mg/dL — ABNORMAL HIGH (ref 0.76–1.27)
GFR calc Af Amer: 58 mL/min/{1.73_m2} — ABNORMAL LOW (ref >59)
GFR calc non Af Amer: 50 mL/min/{1.73_m2} — ABNORMAL LOW (ref >59)
Glucose: 184 mg/dL — ABNORMAL HIGH (ref 65–99)
Potassium: 4.6 mmol/L (ref 3.5–5.2)
Sodium: 138 mmol/L (ref 134–144)

## 2019-08-30 NOTE — Patient Outreach (Signed)
[facility]) Care Management  08/30/2019  Ryan [name] 05-22-1950 [id number]                                       Medication Assistance Referral  Referral From: [facility] Embedded RPh Catie T.   Medication/Company: Delene Loll / [employer] Patient application portion:  N/A Embedded pharmacist to have patient sign in clinic Provider application portion: Faxed  to Dr. Coralyn Pear Provider address/fax verified via: Office website     Follow up:  Will follow up with providert in 5-7 business days if application(s) have not been received back  Aleksandr Pellow P. Ardel Jagger, [place]  [phone]

## 2019-08-30 NOTE — Progress Notes (Signed)
Contacted via MyChart Good morning Ryan [name].  Your labs have returned.  You continue to show some mild kidney disease stage 3, this has been ongoing for a couple years and is remaining stable with no significant decline.  We will continue to monitor this by checking your urine yearly and monitoring your creatinine and GFR on blood work.  Electrolytes all look good.  Keep up the good work. Keep being awesome!! Kindest regards, Solangel Mcmanaway

## 2019-09-05 ENCOUNTER — Other Ambulatory Visit: Payer: Self-pay | Admitting: Pharmacy Technician

## 2019-09-05 ENCOUNTER — Ambulatory Visit: Payer: Self-pay | Admitting: Pharmacist

## 2019-09-05 DIAGNOSIS — I5020 Unspecified systolic (congestive) heart failure: Secondary | ICD-10-CM

## 2019-09-05 NOTE — Patient Outreach (Signed)
[facility]) Care Management  09/05/2019  ADON SENN 08/09/1950 [id number]   Received both patient and provider portion(s) of patient assistance application(s) for Entreso. Faxed completed application and required documents into [employer].  Will follow up with company(ies) in 7-14 business days to check status of application(s).  Peng Thorstenson P. Tzirel Leonor, [place]  [phone]

## 2019-09-05 NOTE — Patient Instructions (Signed)
Visit Information  Goals Addressed            This Visit's Progress     Patient Stated   . PharmD "I can't afford this medication" (pt-stated)       CARE PLAN ENTRY (see longtitudinal plan of care for additional care plan information)  Current Barriers:  . Polypharmacy; complex patient with multiple comorbidities including hx HFrEF, T2DM, HTN, HLD . Cost concerns w/ Entresto . Most recent eGFR: 57 mL/min o HFrEF: Entresto 24/26 mg BID, carvedilol 12.5 mg BID, spironolactone 25 mg daily o T2DM/HFrEF: Invokana 100 mg daily; A1c today 7.1%.  o ASCVD risk reduction: 10 year ASCVD risk score 27%. Hx statin intolerance to pravastatin and rosuvastatin w/ significant joint pain. o Gout: febuxostat 40 mg daily, last uric acid at goal <6 o Hypothyroidism: levothyroxine 50 mcg daily o GERD: Dexlansoprazole 60 mg daily .   Pharmacist Clinical Goal(s):  Marland Kitchen Over the next 90 days, patient will work with PharmD and provider towards optimized medication management  Interventions: . Comprehensive medication review performed, medication list updated in electronic medical record . Inter-disciplinary care team collaboration (see longitudinal plan of care) . Received patient portions of application, as well as total household income information. Will pass along to CPhT to submit once provider portion is received.   Patient Self Care Activities:  . Patient will take medications as prescribed  Please see past updates related to this goal by clicking on the "Past Updates" button in the selected goal         Patient verbalizes understanding of instructions provided today.    Plan:  - Will continue to collaborate as above  Catie Darnelle Maffucci, PharmD, [place] [phone]

## 2019-09-05 NOTE — Chronic Care Management (AMB) (Signed)
  Chronic Care Management   Follow Up Note   09/05/2019 Name: Ryan [name] [name]: [id number] DOB: 1950/09/20  Referred by: Venita Lick, NP Reason for referral : Chronic Care Management (Medication Management)   KAELUM KISSICK is a 69 y.o. year old male who is a primary care patient of Cannady, Barbaraann Faster, NP. The CCM team was consulted for assistance with chronic disease management and care coordination needs.    Care coordination completed today.   Review of patient status, including review of consultants reports, relevant laboratory and other test results, and collaboration with appropriate care team members and the patient's provider was performed as part of comprehensive patient evaluation and provision of chronic care management services.    SDOH (Social Determinants of Health) assessments performed: Yes See Care Plan activities for detailed interventions related to [facility])     Outpatient Encounter Medications as of 09/05/2019  Medication Sig  . canagliflozin (INVOKANA) 100 MG TABS tablet Take 1 tablet (100 mg total) by mouth daily before breakfast.  . carvedilol (COREG) 12.5 MG tablet Take 1 tablet (12.5 mg total) by mouth 2 (two) times daily.  . Cholecalciferol (VITAMIN D3 SUPER STRENGTH) 50 MCG (2000 UT) CAPS Take 2,000 Units by mouth daily.  Marland Kitchen dexlansoprazole (DEXILANT) 60 MG capsule Take 1 capsule (60 mg total) by mouth daily.  Marland Kitchen ENTRESTO 24-26 MG Take 1 tablet by mouth 2 (two) times daily.  . febuxostat (ULORIC) 40 MG tablet Take 1 tablet (40 mg total) by mouth daily.  Marland Kitchen levothyroxine (SYNTHROID) 50 MCG tablet Take 1 tablet (50 mcg total) by mouth daily.  Marland Kitchen spironolactone (ALDACTONE) 25 MG tablet Take 1 tablet (25 mg total) by mouth daily.   No facility-administered encounter medications on file as of 09/05/2019.     Objective:   Goals Addressed            This Visit's Progress     Patient Stated   . PharmD "I can't afford this medication" (pt-stated)       CARE  PLAN ENTRY (see longtitudinal plan of care for additional care plan information)  Current Barriers:  . Polypharmacy; complex patient with multiple comorbidities including hx HFrEF, T2DM, HTN, HLD . Cost concerns w/ Entresto . Most recent eGFR: 57 mL/min o HFrEF: Entresto 24/26 mg BID, carvedilol 12.5 mg BID, spironolactone 25 mg daily o T2DM/HFrEF: Invokana 100 mg daily; A1c today 7.1%.  o ASCVD risk reduction: 10 year ASCVD risk score 27%. Hx statin intolerance to pravastatin and rosuvastatin w/ significant joint pain. o Gout: febuxostat 40 mg daily, last uric acid at goal <6 o Hypothyroidism: levothyroxine 50 mcg daily o GERD: Dexlansoprazole 60 mg daily .   Pharmacist Clinical Goal(s):  Marland Kitchen Over the next 90 days, patient will work with PharmD and provider towards optimized medication management  Interventions: . Comprehensive medication review performed, medication list updated in electronic medical record . Inter-disciplinary care team collaboration (see longitudinal plan of care) . Received patient portions of application, as well as total household income information. Will pass along to CPhT to submit once provider portion is received.   Patient Self Care Activities:  . Patient will take medications as prescribed  Please see past updates related to this goal by clicking on the "Past Updates" button in the selected goal          Plan:  - Will continue to collaborate as above  Catie Darnelle Maffucci, PharmD, [place] [phone]

## 2019-09-06 NOTE — Telephone Encounter (Signed)
Late entry- Entresto patient assistance application signed by Dr. Caryl Comes &  faxed to Susy Frizzle at [facility] on 09/03/19. Fax- [phone] Confirmation received.

## 2019-09-06 NOTE — Telephone Encounter (Signed)
The patient called the office this afternoon to follow up on his entresto. I advised I had not called him back since receiving a message from [place] at [facility] about his patient assistance. I apologized as I thought she was handling this at this point. The patient was very understanding.  He advised that he had 1 tablet left today and his wife ended up having to pay a little over $700 out of pocket for a #30 supply for his medication.  The patient states that this has been covered by his plan in the past, but they are not covering it this year.  The patient states he has been speaking with Abigail Butts at [employer] and that she had sent a request to [place] back earlier this month for Dr. Caryl Comes to do a letter to the insurance company about his needing this medication.  I advised the patient I had not received any letters from [employer], but reviewed his chart further and saw that a PA had been submitted by Jeani Hawking Via, LPN in the refill department at our [facility] location in [place]. I advised the patient this is most likely what Abigail Butts had sent over at the beginning of the month. He asked that I call her to follow up.  I advised the patient I would follow up with Abigail Butts and will check in with [employer] patient assistance in about 10 business days to see where his application is in the process of approval/ denial.  The patient voices understanding and is agreeable.  I did call and speak to Abigail Butts at [employer]. She states she did send over the PA request earlier this month. I advised her I see where this was submitted, but do not see documentation of approval/ denial. Abigail Butts states she did not try to run this back through insurance today as she had not heard from the office.  The patient paid a little over $700 cash for his entresto today.  She asked that I confirm if an authorization was obtained and call her back. She will be working tomorrow from 8:30 am- 4:30 pm.  Contact # [phone].    Secure chat sent to [facility] Via, LPN to follow up with me tomorrow about this as it appears she is out of the office today.

## 2019-09-07 NOTE — Telephone Encounter (Signed)
**Note De-Identified Lilinoe Acklin Obfuscation** I called [employer] at [phone] and did an urgent appeal with for the pts Entresto. Per Elmyra Ricks it takes up to 72 hours for a determination and that they will fax [place] and call the pt with their determination once determined.

## 2019-09-07 NOTE — Telephone Encounter (Signed)
[employer] calling  States prior Josem Kaufmann was denied and we need to initiate an appeal Can call [employer] at [phone] A determination letter would need to be faxed to [employer] [phone] Phone [phone]

## 2019-09-07 NOTE — Telephone Encounter (Signed)
**Note De-Identified [name] Obfuscation** I checked covermymeds for an update on this Entresto PA (Key: [id number]) and found that it has been denied as follows: :  Ryan [name] Key: [id number] - PA Case ID: [id number] Need help? Call us at [phone]  Outcome  Denied on April 19  Case [id number] Status:Denied;Review Type:Prior Auth;Appeal Information: [place] [id number]. [phone] Phone:[phone] Fax:[phone] WebAddress:[email or web address]; Important - Please read the below note on eAppeals: Please reference the denial letter for information on the rights for an appeal, rationale for the denial, and how to submit an appeal including if any information is needed to support the appeal. Note about urgent situations - Generally, an urgent situation is one which, in the opinion of the provider, the health of the patient may be in serious jeopardy or may experience pain that cannot be adequately controlled while waiting for a decision on the appeal.;  DrugEntresto 24-26MG  tablets  Form[employer] Electronic PA Form (2017 NCPDP)  Appeal forms never received in office. I will call and attempt to obtain form from [employer] or do appeal over the phone.

## 2019-09-10 NOTE — Telephone Encounter (Signed)
**Note De-Identified Rhegan Trunnell Obfuscation** Letter received from [employer] stating that they approved the pts [place] PA. Approved from 05/11/2019 until 09/06/2020 Pt ID: [id number]  I have notified [facility] and the pt of this approval.

## 2019-09-10 NOTE — Telephone Encounter (Signed)
**Note De-Identified Keni Wafer Obfuscation** Letter received from [employer] stating that they approved the pts [place] PA. Approved from 05/11/2019 until 09/06/2020 Pt ID: [id number]  I have notified [facility] and the pt of this approval.

## 2019-09-10 NOTE — Telephone Encounter (Signed)
Noted  

## 2019-09-14 ENCOUNTER — Other Ambulatory Visit: Payer: Self-pay | Admitting: Pharmacy Technician

## 2019-09-14 ENCOUNTER — Ambulatory Visit: Payer: Self-pay | Admitting: Pharmacist

## 2019-09-14 NOTE — Chronic Care Management (AMB) (Signed)
  Chronic Care Management   Note  09/14/2019 Name: WA RUMPF [name]: [id number] DOB: 06-27-1950  EDEM THOMASSON is a 69 y.o. year old male who is a primary care patient of Cannady, Barbaraann Faster, NP. The CCM team was consulted for assistance with chronic disease management and care coordination needs.    Received message from CPhT that [facility] application required additional information regarding PA (see note). Also noted that a phone note from cardiology shows that PA was approved, and that patient and [facility] were informed. Unsure if patient is planning on just picking up Entresto from the pharmacy, instead of patient assistance. Left HIPAA compliant message for patient to return my call at his convenience.   Follow up plan: - Will outreach again within a week if I don't hear back.   Catie Darnelle Maffucci, PharmD, [place] [phone]

## 2019-09-14 NOTE — Patient Outreach (Signed)
[facility]) Care Management  09/14/2019  Ryan [name] 17-Aug-1950 [id number]  Care coordination call placed to [employer] in regards to patient's application for Entresto.  Spoke to [place] who informed that a PA would need to completed for the [facility] They would need a copy of the letter faxed to them. If the PA is denied then an appeal would need to be submitted to the insurance company. They would also need a copy of the appeal outcome.  Will route note to embedded [place] for assistance in the PA and if needed the  PA appeals process.  Ryan [name] P. [name], [place]  [phone]

## 2019-09-17 ENCOUNTER — Ambulatory Visit: Payer: Self-pay | Admitting: Pharmacist

## 2019-09-17 ENCOUNTER — Telehealth: Payer: Self-pay | Admitting: *Deleted

## 2019-09-17 DIAGNOSIS — I429 Cardiomyopathy, unspecified: Secondary | ICD-10-CM

## 2019-09-17 DIAGNOSIS — I428 Other cardiomyopathies: Secondary | ICD-10-CM

## 2019-09-17 DIAGNOSIS — E1159 Type 2 diabetes mellitus with other circulatory complications: Secondary | ICD-10-CM

## 2019-09-17 DIAGNOSIS — I5020 Unspecified systolic (congestive) heart failure: Secondary | ICD-10-CM

## 2019-09-17 DIAGNOSIS — I10 Essential (primary) hypertension: Secondary | ICD-10-CM

## 2019-09-17 NOTE — Patient Instructions (Signed)
Visit Information  Goals Addressed            This Visit's Progress     Patient Stated   . PharmD "I can't afford this medication" (pt-stated)       CARE PLAN ENTRY (see longtitudinal plan of care for additional care plan information)  Current Barriers:  . Polypharmacy; complex patient with multiple comorbidities including hx HFrEF, T2DM, HTN, HLD . Cost concerns w/ Delene Loll- PA approved per documentation; however, per cardiology phone note today, they received documentation that [employer] patient assistance for Delene Loll has been approved.  . Most recent eGFR: 57 mL/min o HFrEF: Entresto 24/26 mg BID, carvedilol 12.5 mg BID, spironolactone 25 mg daily o T2DM/HFrEF: Invokana 100 mg daily; A1c today 7.1%.  o ASCVD risk reduction: 10 year ASCVD risk score 27%. Hx statin intolerance to pravastatin and rosuvastatin w/ significant joint pain. o Gout: febuxostat 40 mg daily, last uric acid at goal <6 o Hypothyroidism: levothyroxine 50 mcg daily o GERD: Dexlansoprazole 60 mg daily .   Pharmacist Clinical Goal(s):  Marland Kitchen Over the next 90 days, patient will work with PharmD and provider towards optimized medication management  Interventions: . Comprehensive medication review performed, medication list updated in electronic medical record . Inter-disciplinary care team collaboration (see longitudinal plan of care) . Will collaborate w/ CPhT to contact [employer] to f/u on approval information and shipping information  Patient Self Care Activities:  . Patient will take medications as prescribed  Please see past updates related to this goal by clicking on the "Past Updates" button in the selected goal         Patient verbalizes understanding of instructions provided today.   Plan:  - Scheduled f/u call in ~ 12 weeks  Catie Darnelle Maffucci, PharmD, [place] [phone]

## 2019-09-17 NOTE — Chronic Care Management (AMB) (Signed)
Chronic Care Management   Follow Up Note   09/17/2019 Name: Ryan [name] [name]: [id number] DOB: 1951-01-26  Referred by: Venita Lick, NP Reason for referral : Chronic Care Management (Medication Management)   Ryan [name] is a 69 y.o. year old male who is a primary care patient of Cannady, Barbaraann Faster, NP. The CCM team was consulted for assistance with chronic disease management and care coordination needs.    Received call back from patient today.   Review of patient status, including review of consultants reports, relevant laboratory and other test results, and collaboration with appropriate care team members and the patient's provider was performed as part of comprehensive patient evaluation and provision of chronic care management services.    SDOH (Social Determinants of Health) assessments performed: No See Care Plan activities for detailed interventions related to [facility])     Outpatient Encounter Medications as of 09/17/2019  Medication Sig  . canagliflozin (INVOKANA) 100 MG TABS tablet Take 1 tablet (100 mg total) by mouth daily before breakfast.  . carvedilol (COREG) 12.5 MG tablet Take 1 tablet (12.5 mg total) by mouth 2 (two) times daily.  . Cholecalciferol (VITAMIN D3 SUPER STRENGTH) 50 MCG (2000 UT) CAPS Take 2,000 Units by mouth daily.  Marland Kitchen dexlansoprazole (DEXILANT) 60 MG capsule Take 1 capsule (60 mg total) by mouth daily.  Marland Kitchen ENTRESTO 24-26 MG Take 1 tablet by mouth 2 (two) times daily.  . febuxostat (ULORIC) 40 MG tablet Take 1 tablet (40 mg total) by mouth daily.  Marland Kitchen levothyroxine (SYNTHROID) 50 MCG tablet Take 1 tablet (50 mcg total) by mouth daily.  Marland Kitchen spironolactone (ALDACTONE) 25 MG tablet Take 1 tablet (25 mg total) by mouth daily.   No facility-administered encounter medications on file as of 09/17/2019.     Objective:   Goals Addressed            This Visit's Progress     Patient Stated   . PharmD "I can't afford this medication" (pt-stated)      CARE PLAN ENTRY (see longtitudinal plan of care for additional care plan information)  Current Barriers:  . Polypharmacy; complex patient with multiple comorbidities including hx HFrEF, T2DM, HTN, HLD . Cost concerns w/ Delene Loll- PA approved per documentation; however, per cardiology phone note today, they received documentation that [employer] patient assistance for Delene Loll has been approved.  . Most recent eGFR: 57 mL/min o HFrEF: Entresto 24/26 mg BID, carvedilol 12.5 mg BID, spironolactone 25 mg daily o T2DM/HFrEF: Invokana 100 mg daily; A1c today 7.1%.  o ASCVD risk reduction: 10 year ASCVD risk score 27%. Hx statin intolerance to pravastatin and rosuvastatin w/ significant joint pain. o Gout: febuxostat 40 mg daily, last uric acid at goal <6 o Hypothyroidism: levothyroxine 50 mcg daily o GERD: Dexlansoprazole 60 mg daily .   Pharmacist Clinical Goal(s):  Marland Kitchen Over the next 90 days, patient will work with PharmD and provider towards optimized medication management  Interventions: . Comprehensive medication review performed, medication list updated in electronic medical record . Inter-disciplinary care team collaboration (see longitudinal plan of care) . Will collaborate w/ CPhT to contact [employer] to f/u on approval information and shipping information  Patient Self Care Activities:  . Patient will take medications as prescribed  Please see past updates related to this goal by clicking on the "Past Updates" button in the selected goal          Plan:  - Scheduled f/u call in ~ 12 weeks  Catie Darnelle Maffucci,  PharmD, [place] [phone]

## 2019-09-17 NOTE — Telephone Encounter (Signed)
Pt has been approved for [employer] Patient [place] year.

## 2019-09-19 ENCOUNTER — Other Ambulatory Visit: Payer: Self-pay | Admitting: Pharmacy Technician

## 2019-09-19 NOTE — Patient Outreach (Signed)
[place] [facility]) Care Management  09/19/2019  Ryan [name] Sep 11, 1950 [id number]  Care coordination call placed to [employer] in regards to [facility] application.  Spoke to Shaun who informed patient was APPROVED 09/17/2019-05/09/2020. He informed the medication shipment was leaving the ware house today with an expected delivery on 09/21/2019.  Will follow up with patient in 5-10 business days to inquire if medication was received and to discuss refill procedure.  Ryan [name], [place]  [phone]

## 2019-09-25 ENCOUNTER — Other Ambulatory Visit: Payer: Self-pay | Admitting: Pharmacy Technician

## 2019-09-25 NOTE — Patient Outreach (Signed)
[place] [facility]) Care Management  09/25/2019  DURANT RODRIGES 24-Jul-1950 [id number]  ADDENDUM    Incoming call from patient regarding patient assistance medication delivery of Entresto from [employer], HIPAA identifiers verified.   Patient confirmed receiving the medication. Discussed refill procedure with patient which requires patient to phone [employer] when he needs a refill. Patient was able to locate the phone number in the paperwork that was sent with the medication. Confirmed patient had name and number as he had no other questions or concerns.  Follow up:  Will route note to embedded [facility] R[facility] Catie Darnelle Maffucci for case closure  Porfirio Bollier P. Osa Campoli, [place]  [phone]

## 2019-09-25 NOTE — Patient Outreach (Signed)
[place] [facility]) Care Management  09/25/2019  PAULMICHAEL MANGIAPANE 1950/12/09 [id number]   Unsuccessful call placed to patient regarding patient assistance medication delivery of Entresto with [employer], HIPAA compliant voicemail left.   Was calling to inquire if patient has received the medication and to discuss refill process  Follow up:  Will route note to embedded [facility] RPh Catie Darnelle Maffucci to inquire about receipt of medication and to discuss refill process at next CCm visit. Will remove myself from care team as patient assistance is completed.  Destiny Hagin P. Adelae Yodice, [place]  [phone]

## 2019-09-28 ENCOUNTER — Other Ambulatory Visit: Payer: Self-pay | Admitting: Family Medicine

## 2019-10-03 ENCOUNTER — Ambulatory Visit (INDEPENDENT_AMBULATORY_CARE_PROVIDER_SITE_OTHER): Payer: Medicare Other | Admitting: Pharmacist

## 2019-10-03 DIAGNOSIS — I10 Essential (primary) hypertension: Secondary | ICD-10-CM

## 2019-10-03 DIAGNOSIS — E1169 Type 2 diabetes mellitus with other specified complication: Secondary | ICD-10-CM

## 2019-10-03 DIAGNOSIS — E785 Hyperlipidemia, unspecified: Secondary | ICD-10-CM

## 2019-10-03 DIAGNOSIS — E1159 Type 2 diabetes mellitus with other circulatory complications: Secondary | ICD-10-CM

## 2019-10-03 DIAGNOSIS — I5020 Unspecified systolic (congestive) heart failure: Secondary | ICD-10-CM | POA: Diagnosis not present

## 2019-10-03 NOTE — Chronic Care Management (AMB) (Signed)
Chronic Care Management   Follow Up Note   10/03/2019 Name: Ryan [name] [name]: [id number] DOB: 1950/05/25  Referred by: Venita Lick, NP Reason for referral : Chronic Care Management (Medication Management)   Ryan [name] is a 69 y.o. year old male who is a primary care patient of Cannady, Barbaraann Faster, NP. The CCM team was consulted for assistance with chronic disease management and care coordination needs.    Contacted patient for medication management review.   Review of patient status, including review of consultants reports, relevant laboratory and other test results, and collaboration with appropriate care team members and the patient's provider was performed as part of comprehensive patient evaluation and provision of chronic care management services.    SDOH (Social Determinants of Health) assessments performed: Yes See Care Plan activities for detailed interventions related to SDOH)  SDOH Interventions     Most Recent Value  SDOH Interventions  Financial Strain Interventions  Other (Comment) [manufacturer assistance support]       Outpatient Encounter Medications as of 10/03/2019  Medication Sig Note  . canagliflozin (INVOKANA) 100 MG TABS tablet Take 1 tablet (100 mg total) by mouth daily before breakfast.   . carvedilol (COREG) 12.5 MG tablet Take 1 tablet (12.5 mg total) by mouth 2 (two) times daily.   . Cholecalciferol (VITAMIN D3 SUPER STRENGTH) 50 MCG (2000 UT) CAPS Take 2,000 Units by mouth daily.   Marland Kitchen dexlansoprazole (DEXILANT) 60 MG capsule Take 1 capsule (60 mg total) by mouth daily.   Marland Kitchen ENTRESTO 24-26 MG Take 1 tablet by mouth 2 (two) times daily.   . febuxostat (ULORIC) 40 MG tablet Take 1 tablet (40 mg total) by mouth daily.   Marland Kitchen levothyroxine (SYNTHROID) 50 MCG tablet Take 1 tablet (50 mcg total) by mouth daily.   Marland Kitchen spironolactone (ALDACTONE) 25 MG tablet Take 1 tablet (25 mg total) by mouth daily. 10/03/2019: Taking 12.5 mg daily   No facility-administered  encounter medications on file as of 10/03/2019.     Objective:   Goals Addressed            This Visit's Progress     Patient Stated   . PharmD "I can't afford this medication" (pt-stated)       CARE PLAN ENTRY (see longtitudinal plan of care for additional care plan information)  Current Barriers:  . Polypharmacy; complex patient with multiple comorbidities including hx HFrEF, T2DM, HTN, HLD o Reports stress right now with mother in law having recently fallen and broken hip, was in rehab o Notes he has never been told he has CKD before, wonders what this means, if he needs to see a nephrologist, and what he can do to improve kidney function . Cost concerns w/ Entresto - relieved; receiving Entresto through patient assistance . Most recent eGFR: 57 mL/min o HFrEF: Entresto 24/26 mg BID, carvedilol 12.5 mg BID, spironolactone 25 mg daily - taking 1/2 tablet daily and has been doing this for a while  o T2DM/HFrEF: Invokana 100 mg daily; A1c 7.1%.  - Randoms: 150-220s - Exercise: reports he stays active around the house  - Breakfast: 2 eggs, sausage patty;  - Lunch: bologna/ham/pimiento cheese sandwich, whole wheat bread - Supper: grilled chicken, vegetables; - Drinks: Control and instrumentation engineer packets, 3-4 glasses of water; 1 beer w/ supper  - Desserts: notes sweets are a problem for him; will get low sugar cookies (animal crackers, vanilla wafers); reports he grew up with having dessert with every meal  o  ASCVD risk reduction: 10 year ASCVD risk score 27%. Hx statin intolerance to pravastatin and rosuvastatin w/ significant joint pain. No hx ezetimibe o Gout: febuxostat 40 mg daily, last uric acid at goal <6 o Hypothyroidism: levothyroxine 50 mcg daily o GERD: Dexlansoprazole 60 mg daily  Pharmacist Clinical Goal(s):  Marland Kitchen Over the next 90 days, patient will work with PharmD and provider towards optimized medication management  Interventions: . Comprehensive medication review  performed, medication list updated in electronic medical record . Inter-disciplinary care team collaboration (see longitudinal plan of care) . Reviewed Entresto assistance approval. Patient aware of enrollment and refill procedure . Based on income, he may also qualify for Invokana assistance, based on 2019 tax return (they require submitting tax return, and 2020 return has not been completed yet). He will come by today to sign that application. Will collaborate w/ PCP to sign application.  . Reviewed that if next A1c not at goal <7, recommend increasing Invokana to 300 mg daily. Reviewed goal A1c, goal fasting, and goal 2 hour post prandial glucose.  Marland Kitchen Discussed definition of "CKD", progression, etiology. Discussed importance of managing HTN and DM and avoiding nephrotoxic agents, such as NSAIDs. Discussed importance of staying well hydrated, but not over hydrated to avoid exacerbation of HF. Patient verbalized understanding. Plans to discuss when nephrology referral is needed w/ PCP at upcoming visit . Discussed goal LDL <70 given DM, CKD risk factors. Discussed hx intolerance to pravastatin and rosuvastatin. Discussed ezetimibe. Patient will discuss w/ PCP at upcoming appointment.   Patient Self Care Activities:  . Patient will take medications as prescribed  Please see past updates related to this goal by clicking on the "Past Updates" button in the selected goal          Plan:  - Scheduled f/u call in ~12 weeks  Catie Darnelle Maffucci, PharmD, [place] [phone]

## 2019-10-03 NOTE — Patient Instructions (Signed)
Visit Information  Goals Addressed            This Visit's Progress     Patient Stated   . PharmD "I can't afford this medication" (pt-stated)       CARE PLAN ENTRY (see longtitudinal plan of care for additional care plan information)  Current Barriers:  . Polypharmacy; complex patient with multiple comorbidities including hx HFrEF, T2DM, HTN, HLD o Reports stress right now with mother in law having recently fallen and broken hip, was in rehab o Notes he has never been told he has CKD before, wonders what this means, if he needs to see a nephrologist, and what he can do to improve kidney function . Cost concerns w/ Entresto - relieved; receiving Entresto through patient assistance . Most recent eGFR: 57 mL/min o HFrEF: Entresto 24/26 mg BID, carvedilol 12.5 mg BID, spironolactone 25 mg daily - taking 1/2 tablet daily and has been doing this for a while  o T2DM/HFrEF: Invokana 100 mg daily; A1c 7.1%.  - Randoms: 150-220s - Exercise: reports he stays active around the house  - Breakfast: 2 eggs, sausage patty;  - Lunch: bologna/ham/pimiento cheese sandwich, whole wheat bread - Supper: grilled chicken, vegetables; - Drinks: Control and instrumentation engineer packets, 3-4 glasses of water; 1 beer w/ supper  - Desserts: notes sweets are a problem for him; will get low sugar cookies (animal crackers, vanilla wafers); reports he grew up with having dessert with every meal  o ASCVD risk reduction: 10 year ASCVD risk score 27%. Hx statin intolerance to pravastatin and rosuvastatin w/ significant joint pain. No hx ezetimibe o Gout: febuxostat 40 mg daily, last uric acid at goal <6 o Hypothyroidism: levothyroxine 50 mcg daily o GERD: Dexlansoprazole 60 mg daily  Pharmacist Clinical Goal(s):  Marland Kitchen Over the next 90 days, patient will work with PharmD and provider towards optimized medication management  Interventions: . Comprehensive medication review performed, medication list updated in electronic  medical record . Inter-disciplinary care team collaboration (see longitudinal plan of care) . Reviewed Entresto assistance approval. Patient aware of enrollment and refill procedure . Based on income, he may also qualify for Invokana assistance, based on 2019 tax return (they require submitting tax return, and 2020 return has not been completed yet). He will come by today to sign that application. Will collaborate w/ PCP to sign application.  . Reviewed that if next A1c not at goal <7, recommend increasing Invokana to 300 mg daily. Reviewed goal A1c, goal fasting, and goal 2 hour post prandial glucose.  Marland Kitchen Discussed definition of "CKD", progression, etiology. Discussed importance of managing HTN and DM and avoiding nephrotoxic agents, such as NSAIDs. Discussed importance of staying well hydrated, but not over hydrated to avoid exacerbation of HF. Patient verbalized understanding. Plans to discuss when nephrology referral is needed w/ PCP at upcoming visit . Discussed goal LDL <70 given DM, CKD risk factors. Discussed hx intolerance to pravastatin and rosuvastatin. Discussed ezetimibe. Patient will discuss w/ PCP at upcoming appointment.   Patient Self Care Activities:  . Patient will take medications as prescribed  Please see past updates related to this goal by clicking on the "Past Updates" button in the selected goal         Patient verbalizes understanding of instructions provided today.   Plan:  - Scheduled f/u call in ~12 weeks  Catie Darnelle Maffucci, PharmD, [place] [phone]

## 2019-10-05 ENCOUNTER — Ambulatory Visit: Payer: Medicare Other | Admitting: Pharmacist

## 2019-10-05 DIAGNOSIS — E1159 Type 2 diabetes mellitus with other circulatory complications: Secondary | ICD-10-CM

## 2019-10-05 NOTE — Patient Instructions (Signed)
Visit Information  Goals Addressed            This Visit's Progress     Patient Stated   . PharmD "I can't afford this medication" (pt-stated)       CARE PLAN ENTRY (see longtitudinal plan of care for additional care plan information)  Current Barriers:  . Polypharmacy; complex patient with multiple comorbidities including hx HFrEF, T2DM, HTN, HLD . Working on patient assistance for SGLT2 . Cost concerns w/ Entresto - relieved; receiving Entresto through patient assistance . Most recent eGFR: 57 mL/min o HFrEF: Entresto 24/26 mg BID, carvedilol 12.5 mg BID, spironolactone 12.5 mg daily o T2DM/HFrEF: Invokana 100 mg daily; A1c 7.1%.  o ASCVD risk reduction: 10 year ASCVD risk score 27%. Hx statin intolerance to pravastatin and rosuvastatin w/ significant joint pain. No hx ezetimibe o Gout: febuxostat 40 mg daily, last uric acid at goal <6 o Hypothyroidism: levothyroxine 50 mcg daily o GERD: Dexlansoprazole 60 mg daily  Pharmacist Clinical Goal(s):  Marland Kitchen Over the next 90 days, patient will work with PharmD and provider towards optimized medication management  Interventions: . Inter-disciplinary care team collaboration (see longitudinal plan of care) . Received patient income information; he will qualify for [employer] assistance, however, will still need to check if he meets out of pocket spend requirement (4% of total household income). Unlikely that he has. If not, will discuss switching to [place]. Contacted patient, left voicemail for him to return my call at his convenience .  Patient Self Care Activities:  . Patient will take medications as prescribed  Please see past updates related to this goal by clicking on the "Past Updates" button in the selected goal         Patient verbalizes understanding of instructions provided today.   Plan:  - Will await return call from patient  Catie Darnelle Maffucci, PharmD, [place] [phone]

## 2019-10-05 NOTE — Chronic Care Management (AMB) (Signed)
Chronic Care Management   Follow Up Note   10/05/2019 Name: Ryan [name] [name]: [id number] DOB: Feb 04, 1951  Referred by: Venita Lick, NP Reason for referral : Chronic Care Management (Medication Management)   Ryan [name] is a 69 y.o. year old male who is a primary care patient of Cannady, Barbaraann Faster, NP. The CCM team was consulted for assistance with chronic disease management and care coordination needs.    Care coordination completed today.   Review of patient status, including review of consultants reports, relevant laboratory and other test results, and collaboration with appropriate care team members and the patient's provider was performed as part of comprehensive patient evaluation and provision of chronic care management services.    SDOH (Social Determinants of Health) assessments performed: Yes See Care Plan activities for detailed interventions related to [facility])     Outpatient Encounter Medications as of 10/05/2019  Medication Sig Note  . canagliflozin (INVOKANA) 100 MG TABS tablet Take 1 tablet (100 mg total) by mouth daily before breakfast.   . carvedilol (COREG) 12.5 MG tablet Take 1 tablet (12.5 mg total) by mouth 2 (two) times daily.   . Cholecalciferol (VITAMIN D3 SUPER STRENGTH) 50 MCG (2000 UT) CAPS Take 2,000 Units by mouth daily.   Marland Kitchen dexlansoprazole (DEXILANT) 60 MG capsule Take 1 capsule (60 mg total) by mouth daily.   Marland Kitchen ENTRESTO 24-26 MG Take 1 tablet by mouth 2 (two) times daily.   . febuxostat (ULORIC) 40 MG tablet Take 1 tablet (40 mg total) by mouth daily.   Marland Kitchen levothyroxine (SYNTHROID) 50 MCG tablet Take 1 tablet (50 mcg total) by mouth daily.   Marland Kitchen spironolactone (ALDACTONE) 25 MG tablet Take 1 tablet (25 mg total) by mouth daily. 10/03/2019: Taking 12.5 mg daily   No facility-administered encounter medications on file as of 10/05/2019.     Objective:   Goals Addressed            This Visit's Progress     Patient Stated   . PharmD "I can't  afford this medication" (pt-stated)       CARE PLAN ENTRY (see longtitudinal plan of care for additional care plan information)  Current Barriers:  . Polypharmacy; complex patient with multiple comorbidities including hx HFrEF, T2DM, HTN, HLD . Working on patient assistance for SGLT2 . Cost concerns w/ Entresto - relieved; receiving Entresto through patient assistance . Most recent eGFR: 57 mL/min o HFrEF: Entresto 24/26 mg BID, carvedilol 12.5 mg BID, spironolactone 12.5 mg daily o T2DM/HFrEF: Invokana 100 mg daily; A1c 7.1%.  o ASCVD risk reduction: 10 year ASCVD risk score 27%. Hx statin intolerance to pravastatin and rosuvastatin w/ significant joint pain. No hx ezetimibe o Gout: febuxostat 40 mg daily, last uric acid at goal <6 o Hypothyroidism: levothyroxine 50 mcg daily o GERD: Dexlansoprazole 60 mg daily  Pharmacist Clinical Goal(s):  Marland Kitchen Over the next 90 days, patient will work with PharmD and provider towards optimized medication management  Interventions: . Inter-disciplinary care team collaboration (see longitudinal plan of care) . Received patient income information; he will qualify for [employer] assistance, however, will still need to check if he meets out of pocket spend requirement (4% of total household income). Unlikely that he has. If not, will discuss switching to [place]. Contacted patient, left voicemail for him to return my call at his convenience .  Patient Self Care Activities:  . Patient will take medications as prescribed  Please see past updates related to this goal by clicking  on the "Past Updates" button in the selected goal          Plan:  - Will await return call from patient  Catie Darnelle Maffucci, PharmD, [place] [phone]

## 2019-10-09 ENCOUNTER — Other Ambulatory Visit: Payer: Self-pay | Admitting: Pharmacy Technician

## 2019-10-09 NOTE — Patient Outreach (Signed)
[facility]) Care Management  10/09/2019  Ryan [name] [birth year] [id number]  Received both patient and provider portion(s) of patient assistance application(s) for Invokana. Faxed completed application and required documents into [employer] with updated date on patient's signature.  Will follow up with company(ies) in 7-10 business days to check status of application(s).  [name] P. [name], [name]  [phone]

## 2019-10-18 ENCOUNTER — Other Ambulatory Visit: Payer: Self-pay | Admitting: Pharmacy Technician

## 2019-10-18 NOTE — Patient Outreach (Signed)
[place] [facility]) Care Management  10/18/2019  GARETT TETZLOFF [birth year] [id number]  Care coordination call placed to [employer] in regards to [facility] application.  Spoke to [place] who informed the application was received but is still int he processing phase. She suggested to try back in a couple of days.  Will follow up with [employer] in 2-3 business days to inquire about a determination.  Zafira Munos P. Offie Waide, [place]  [phone]

## 2019-10-22 ENCOUNTER — Other Ambulatory Visit: Payer: Self-pay | Admitting: Pharmacy Technician

## 2019-10-22 NOTE — Patient Outreach (Signed)
[place] [facility]) Care Management  10/22/2019  Ryan [name] 1950-10-21 [id number]  Care coordination call placed to [employer] in regards to [facility] application.  Spoke to [place] who informed that application was put on hold as patient submitted his 2019 tax return as proof of income. She informed they stopped accepting 2019 returns on 5/32/9924 and the application was faxed in around 10/05/2019. She informed patient would need to submit the official IRS form showing he filed for an extension, he could call if in did not file taxes in 2020 and/or he could also update the application he submitted by  unchecking the box that said he filed taxes.   Will route note to embedded [facility] RPh  Catie Darnelle Maffucci to update/inform patient and to discuss next steps with him.  Nazaire Cordial P. Aneisha Skyles, [place]  [phone]

## 2019-10-23 ENCOUNTER — Ambulatory Visit: Payer: Medicare Other | Admitting: Pharmacist

## 2019-10-23 DIAGNOSIS — E1159 Type 2 diabetes mellitus with other circulatory complications: Secondary | ICD-10-CM

## 2019-10-23 DIAGNOSIS — I5020 Unspecified systolic (congestive) heart failure: Secondary | ICD-10-CM

## 2019-10-23 NOTE — Patient Instructions (Signed)
Visit Information  Goals Addressed              This Visit's Progress     Patient Stated   .  PharmD "I can't afford this medication" (pt-stated)        CARE PLAN ENTRY (see longtitudinal plan of care for additional care plan information)  Current Barriers:  . Polypharmacy; complex patient with multiple comorbidities including hx HFrEF, T2DM, HTN, HLD . Working on patient assistance for SGLT2. Received message from CPhT that [employer] is not accepting patient's 2019 tax return. They require 2020 tax return, or official IRS form that patient filed an extension. . Cost concerns w/ Entresto - relieved; receiving Entresto through patient assistance . Most recent eGFR: 57 mL/min o HFrEF: Entresto 24/26 mg BID, carvedilol 12.5 mg BID, spironolactone 12.5 mg daily o T2DM/HFrEF: Invokana 100 mg daily; A1c 7.1%.  o ASCVD risk reduction: 10 year ASCVD risk score 27%. Hx statin intolerance to pravastatin and rosuvastatin w/ significant joint pain. No hx ezetimibe o Gout: febuxostat 40 mg daily, last uric acid at goal <6 o Hypothyroidism: levothyroxine 50 mcg daily o GERD: Dexlansoprazole 60 mg daily  Pharmacist Clinical Goal(s):  Marland Kitchen Over the next 90 days, patient will work with PharmD and provider towards optimized medication management  Interventions: . Comprehensive medication review performed, medication list updated in electronic medical record . Inter-disciplinary care team collaboration (see longitudinal plan of care) . Reviewed needs w/ patient. He has not received his 2020 tax return yet from his accountant. He will let me know when he gets it (should be in the next few weeks), and we will resubmit to [employer]. Will update CPhT  Patient Self Care Activities:  . Patient will take medications as prescribed  Please see past updates related to this goal by clicking on the "Past Updates" button in the selected goal         Patient verbalizes understanding of instructions provided today.     Plan:  - Will outreach as previously scheduled  Catie Darnelle Maffucci, PharmD, [place] [phone]

## 2019-10-23 NOTE — Chronic Care Management (AMB) (Signed)
Chronic Care Management   Follow Up Note   10/23/2019 Name: Ryan [name] [name]: [id number] DOB: September 05, 1950  Referred by: Venita Lick, NP Reason for referral : Chronic Care Management (Medication Management)   Ryan [name] is a 69 y.o. year old male who is a primary care patient of Cannady, Barbaraann Faster, NP. The CCM team was consulted for assistance with chronic disease management and care coordination needs.    Medication access coordination completed today.  Review of patient status, including review of consultants reports, relevant laboratory and other test results, and collaboration with appropriate care team members and the patient's provider was performed as part of comprehensive patient evaluation and provision of chronic care management services.    SDOH (Social Determinants of Health) assessments performed: Yes See Care Plan activities for detailed interventions related to SDOH)  SDOH Interventions     Most Recent Value  SDOH Interventions  Financial Strain Interventions Other (Comment)  [patient assistance applications]       Outpatient Encounter Medications as of 10/23/2019  Medication Sig Note  . canagliflozin (INVOKANA) 100 MG TABS tablet Take 1 tablet (100 mg total) by mouth daily before breakfast.   . carvedilol (COREG) 12.5 MG tablet Take 1 tablet (12.5 mg total) by mouth 2 (two) times daily.   . Cholecalciferol (VITAMIN D3 SUPER STRENGTH) 50 MCG (2000 UT) CAPS Take 2,000 Units by mouth daily.   Marland Kitchen dexlansoprazole (DEXILANT) 60 MG capsule Take 1 capsule (60 mg total) by mouth daily.   Marland Kitchen ENTRESTO 24-26 MG Take 1 tablet by mouth 2 (two) times daily.   . febuxostat (ULORIC) 40 MG tablet Take 1 tablet (40 mg total) by mouth daily.   Marland Kitchen levothyroxine (SYNTHROID) 50 MCG tablet Take 1 tablet (50 mcg total) by mouth daily.   Marland Kitchen spironolactone (ALDACTONE) 25 MG tablet Take 1 tablet (25 mg total) by mouth daily. 10/03/2019: Taking 12.5 mg daily   No facility-administered  encounter medications on file as of 10/23/2019.     Objective:   Goals Addressed              This Visit's Progress     Patient Stated   .  PharmD "I can't afford this medication" (pt-stated)        CARE PLAN ENTRY (see longtitudinal plan of care for additional care plan information)  Current Barriers:  . Polypharmacy; complex patient with multiple comorbidities including hx HFrEF, T2DM, HTN, HLD . Working on patient assistance for SGLT2. Received message from CPhT that [employer] is not accepting patient's 2019 tax return. They require 2020 tax return, or official IRS form that patient filed an extension. . Cost concerns w/ Entresto - relieved; receiving Entresto through patient assistance . Most recent eGFR: 57 mL/min o HFrEF: Entresto 24/26 mg BID, carvedilol 12.5 mg BID, spironolactone 12.5 mg daily o T2DM/HFrEF: Invokana 100 mg daily; A1c 7.1%.  o ASCVD risk reduction: 10 year ASCVD risk score 27%. Hx statin intolerance to pravastatin and rosuvastatin w/ significant joint pain. No hx ezetimibe o Gout: febuxostat 40 mg daily, last uric acid at goal <6 o Hypothyroidism: levothyroxine 50 mcg daily o GERD: Dexlansoprazole 60 mg daily  Pharmacist Clinical Goal(s):  Marland Kitchen Over the next 90 days, patient will work with PharmD and provider towards optimized medication management  Interventions: . Comprehensive medication review performed, medication list updated in electronic medical record . Inter-disciplinary care team collaboration (see longitudinal plan of care) . Reviewed needs w/ patient. He has not received his 2020  tax return yet from his accountant. He will let me know when he gets it (should be in the next few weeks), and we will resubmit to [employer]. Will update CPhT  Patient Self Care Activities:  . Patient will take medications as prescribed  Please see past updates related to this goal by clicking on the "Past Updates" button in the selected goal          Plan:  - Will  outreach as previously scheduled  Catie Darnelle Maffucci, PharmD, [place] [phone]

## 2019-10-31 ENCOUNTER — Ambulatory Visit (INDEPENDENT_AMBULATORY_CARE_PROVIDER_SITE_OTHER): Payer: Medicare Other | Admitting: *Deleted

## 2019-10-31 DIAGNOSIS — I5020 Unspecified systolic (congestive) heart failure: Secondary | ICD-10-CM

## 2019-10-31 DIAGNOSIS — I442 Atrioventricular block, complete: Secondary | ICD-10-CM | POA: Diagnosis not present

## 2019-10-31 LAB — CUP PACEART REMOTE DEVICE CHECK
Battery Remaining Longevity: 42 mo
Battery Voltage: 2.96 V
Brady Statistic AP VP Percent: 15.01 %
Brady Statistic AP VS Percent: 0.01 %
Brady Statistic AS VP Percent: 84.98 %
Brady Statistic AS VS Percent: 0 %
Brady Statistic RA Percent Paced: 14.88 %
Brady Statistic RV Percent Paced: 99.98 %
Date Time Interrogation Session: 20210623093325
HighPow Impedance: 52 Ohm
HighPow Impedance: 62 Ohm
Implantable Lead Implant Date: 20060505
Implantable Lead Implant Date: 20120111
Implantable Lead Implant Date: 20180314
Implantable Lead Location: 753858
Implantable Lead Location: 753859
Implantable Lead Location: 753860
Implantable Lead Model: 5076
Implantable Lead Model: 6947
Implantable Pulse Generator Implant Date: 20180314
Lead Channel Impedance Value: 222.34 Ohm
Lead Channel Impedance Value: 230.327
Lead Channel Impedance Value: 237.5 Ohm
Lead Channel Impedance Value: 246.635
Lead Channel Impedance Value: 246.635
Lead Channel Impedance Value: 418 Ohm
Lead Channel Impedance Value: 418 Ohm
Lead Channel Impedance Value: 475 Ohm
Lead Channel Impedance Value: 475 Ohm
Lead Channel Impedance Value: 513 Ohm
Lead Channel Impedance Value: 551 Ohm
Lead Channel Impedance Value: 608 Ohm
Lead Channel Impedance Value: 760 Ohm
Lead Channel Impedance Value: 817 Ohm
Lead Channel Impedance Value: 817 Ohm
Lead Channel Impedance Value: 893 Ohm
Lead Channel Impedance Value: 893 Ohm
Lead Channel Impedance Value: 931 Ohm
Lead Channel Pacing Threshold Amplitude: 0.5 V
Lead Channel Pacing Threshold Amplitude: 0.625 V
Lead Channel Pacing Threshold Amplitude: 1.375 V
Lead Channel Pacing Threshold Pulse Width: 0.4 ms
Lead Channel Pacing Threshold Pulse Width: 0.4 ms
Lead Channel Pacing Threshold Pulse Width: 0.4 ms
Lead Channel Sensing Intrinsic Amplitude: 0.375 mV
Lead Channel Sensing Intrinsic Amplitude: 0.375 mV
Lead Channel Setting Pacing Amplitude: 1.25 V
Lead Channel Setting Pacing Amplitude: 1.5 V
Lead Channel Setting Pacing Amplitude: 2 V
Lead Channel Setting Pacing Pulse Width: 0.4 ms
Lead Channel Setting Pacing Pulse Width: 0.4 ms
Lead Channel Setting Sensing Sensitivity: 0.3 mV

## 2019-11-01 NOTE — Progress Notes (Signed)
Remote ICD transmission.   

## 2019-11-06 ENCOUNTER — Other Ambulatory Visit: Payer: Self-pay | Admitting: Pharmacy Technician

## 2019-11-06 NOTE — Patient Outreach (Signed)
[facility]) Care Management  11/06/2019  Ryan [name] Mar 06, 1951 [id number]  Care coordination call placed to [employer] in regards to [facility] application.  Spoke to [place] who informed patient was APPROVED 10/30/2019-05/09/2020. The billing information for the pharmacy to use is [place], [place] [id number], [place] [phone].  Will outreach patient's pharmacy with this information.  [name] P. Samanthamarie Ezzell, [place]  [phone]

## 2019-11-06 NOTE — Patient Outreach (Signed)
[facility]) Care Management  11/06/2019  Ryan [name] [birth year] [id number]   Care coordination call placed to [facility].  Spoke to [place] who informed Ryan [name] brought them the paperwork with the billing information on it. They will able to process the claim and the patient was able to obtain the medication at no charge.  Will route note to embedded [facility] RPh Catie Darnelle Maffucci that patient assistance case is being closed due to medication assistance process has been completed.  Katti Pelle P. Hyrum Shaneyfelt, [place]  [phone]

## 2019-11-28 ENCOUNTER — Encounter: Payer: Self-pay | Admitting: Nurse Practitioner

## 2019-11-28 ENCOUNTER — Other Ambulatory Visit: Payer: Self-pay

## 2019-11-28 ENCOUNTER — Ambulatory Visit (INDEPENDENT_AMBULATORY_CARE_PROVIDER_SITE_OTHER): Payer: Medicare Other | Admitting: Nurse Practitioner

## 2019-11-28 VITALS — BP 130/76 | HR 66 | Temp 98.2°F | Wt 199.0 lb

## 2019-11-28 DIAGNOSIS — E785 Hyperlipidemia, unspecified: Secondary | ICD-10-CM | POA: Diagnosis not present

## 2019-11-28 DIAGNOSIS — E1169 Type 2 diabetes mellitus with other specified complication: Secondary | ICD-10-CM | POA: Diagnosis not present

## 2019-11-28 DIAGNOSIS — N183 Chronic kidney disease, stage 3 unspecified: Secondary | ICD-10-CM

## 2019-11-28 DIAGNOSIS — E1122 Type 2 diabetes mellitus with diabetic chronic kidney disease: Secondary | ICD-10-CM | POA: Diagnosis not present

## 2019-11-28 DIAGNOSIS — I10 Essential (primary) hypertension: Secondary | ICD-10-CM

## 2019-11-28 DIAGNOSIS — I5020 Unspecified systolic (congestive) heart failure: Secondary | ICD-10-CM | POA: Diagnosis not present

## 2019-11-28 DIAGNOSIS — E1159 Type 2 diabetes mellitus with other circulatory complications: Secondary | ICD-10-CM | POA: Diagnosis not present

## 2019-11-28 LAB — BAYER DCA HB A1C WAIVED: HB A1C (BAYER DCA - WAIVED): 7.2 % — ABNORMAL HIGH (ref <7.0)

## 2019-11-28 NOTE — Assessment & Plan Note (Signed)
Noted on recent labs.  Recheck BMP today and monitor closely.  Consider referral to nephrology if worsening.

## 2019-11-28 NOTE — Assessment & Plan Note (Addendum)
Chronic, stable.  Continue current medication regiment and collaboration with cardiology.  BMP today.   Recommend: - Reminded to call for an overnight weight gain of >2 pounds or a weekly weight weight of >5 pounds - not adding salt to his food and has been reading food labels. Reviewed the importance of keeping daily sodium intake to <2000mg daily  

## 2019-11-28 NOTE — Assessment & Plan Note (Signed)
Chronic, ongoing with HF.  A1C today 7.2%, slight trend upwards.  Continue current medication regimen and adjust as needed + continue collaboration with cardiology.  Recommend he check BS at home at least a few mornings a week.  He is to focus on diet heavily over next three months to reach A1C goal <7.  Continue to collaborate with CCM crew.  May increase Invokana to 300 MG next visit if continues above goal OR add on GLP1.  At this time he wishes to continue current regimen.  Return to office in 3 months.

## 2019-11-28 NOTE — Assessment & Plan Note (Signed)
Chronic, ongoing.  Continue current medication regimen and adjust as needed.  Would benefit from statin use with diabetes and heart health or consider injectable, discussed with him today.  LDL last visit was 79.  Lipid panel today.

## 2019-11-28 NOTE — Assessment & Plan Note (Signed)
Chronic, stable with BP at goal today and on home readings.  Continue current medication regimen and collaboration with cardiology.  Recommend checking BP at home at least a few mornings a week + focus on DASH diet.  BMP today.  Return in 3 months.

## 2019-11-28 NOTE — Progress Notes (Signed)
BP 130/76   Pulse 66   Temp 98.2 F (36.8 C) (Oral)   Wt 199 lb (90.3 kg)   SpO2 97%   BMI 26.81 kg/m    Subjective:    Patient ID: Ryan [name], male    DOB: [birth year], 69 y.o.   [name]: [id number]  HPI: Ryan [name] is a 69 y.o. male  Chief Complaint  Patient presents with  . Diabetes  . Hyperlipidemia  . Hypertension   DIABETES Last A1C 7.1%. Takes Invokana. Had intolerance to Metformin. Hypoglycemic episodes:no Polydipsia/polyuria:no Visual disturbance:no Chest pain:no Paresthesias:no Glucose Monitoring:no Accucheck frequency: Not Checking Fasting glucose: Post prandial: Evening: Before meals: Taking Insulin?:no Long acting insulin: Short acting insulin: Blood Pressure Monitoring:daily Retinal Examination:Not Up to Date Foot Exam:Up to Date Pneumovax:took several years Influenza:refused Aspirin:no  HYPERTENSION / HYPERLIPIDEMIA/HF Is followed by cardiology, Dr. Caryl Comes, last saw him in September and sees next in October. Continues on Aldactone, Entresto, Coreg.  No current statin as reported cardiology took him off this.  Tried Pravastatin in past with poor response, with terrible joint pain.  Satisfied with current treatment?yes Duration of hypertension:chronic BP monitoring frequency:a few times a week BP range:110/70 on average BP medication side effects:no Duration of hyperlipidemia:chronic Medication compliance:good compliance Aspirin:no Recent stressors:no Recurrent headaches:no Visual changes:no Palpitations:no Dyspnea:no Chest pain:no Lower extremity edema:no Dizzy/lightheaded:no The 10-year ASCVD risk score Mikey Bussing DC Jr., et al., 2013) is: 34.1%   Values used to calculate the score:     Age: 36 years     Sex: Male     Is Non-Hispanic African American: No     Diabetic: Yes     Tobacco smoker: No     Systolic Blood  Pressure: 130 mmHg     Is BP treated: Yes     HDL Cholesterol: 37 mg/dL     Total Cholesterol: 145 mg/dL  Relevant past medical, surgical, family and social history reviewed and updated as indicated. Interim medical history since our last visit reviewed. Allergies and medications reviewed and updated.  Review of Systems  Constitutional: Negative for activity change, diaphoresis, fatigue and fever.  Respiratory: Negative for cough, chest tightness, shortness of breath and wheezing.   Cardiovascular: Negative for chest pain, palpitations and leg swelling.  Gastrointestinal: Negative.   Endocrine: Negative for polydipsia, polyphagia and polyuria.  Neurological: Negative.   Psychiatric/Behavioral: Negative.     Per HPI unless specifically indicated above     Objective:    BP 130/76   Pulse 66   Temp 98.2 F (36.8 C) (Oral)   Wt 199 lb (90.3 kg)   SpO2 97%   BMI 26.81 kg/m   Wt Readings from Last 3 Encounters:  11/28/19 199 lb (90.3 kg)  08/29/19 200 lb 6.4 oz (90.9 kg)  05/29/19 197 lb 9.6 oz (89.6 kg)    Physical Exam Vitals and nursing note reviewed.  Constitutional:      General: He is awake. He is not in acute distress.    Appearance: He is well-developed and well-groomed. He is not ill-appearing.  HENT:     Head: Normocephalic and atraumatic.     Right Ear: Hearing normal. No drainage.     Left Ear: Hearing normal. No drainage.  Eyes:     General: Lids are normal.        Right eye: No discharge.        Left eye: No discharge.     Conjunctiva/sclera: Conjunctivae normal.     Pupils: Pupils are equal,  round, and reactive to light.  Neck:     Vascular: No carotid bruit.  Cardiovascular:     Rate and Rhythm: Normal rate and regular rhythm.     Heart sounds: Normal heart sounds, S1 normal and S2 normal. No murmur heard.  No gallop.   Pulmonary:     Effort: Pulmonary effort is normal. No accessory muscle usage or respiratory distress.     Breath sounds: Normal  breath sounds.  Abdominal:     General: Bowel sounds are normal.     Palpations: Abdomen is soft.  Musculoskeletal:        General: Normal range of motion.     Cervical back: Normal range of motion and neck supple.     Right lower leg: No edema.     Left lower leg: No edema.  Skin:    General: Skin is warm and dry.     Capillary Refill: Capillary refill takes less than 2 seconds.  Neurological:     Mental Status: He is alert and oriented to person, place, and time.  Psychiatric:        Attention and Perception: Attention normal.        Mood and Affect: Mood normal.        Speech: Speech normal.        Behavior: Behavior normal. Behavior is cooperative.        Thought Content: Thought content normal.    Results for orders placed or performed in visit on 11/28/19  Bayer DCA Hb A1c Waived  Result Value Ref Range   HB A1C (BAYER DCA - WAIVED) 7.2 (H) <7.0 %      Assessment & Plan:   Problem List Items Addressed This Visit      Cardiovascular and Mediastinum   Hypertension associated with diabetes ([place])    Chronic, stable with BP at goal today and on home readings.  Continue current medication regimen and collaboration with cardiology.  Recommend checking BP at home at least a few mornings a week + focus on DASH diet.  BMP today.  Return in 3 months.      Relevant Orders   Bayer DCA Hb A1c Waived (Completed)   Basic metabolic panel   Systolic CHF (HCC)    Chronic, stable.  Continue current medication regiment and collaboration with cardiology.  BMP today.   Recommend: - Reminded to call for an overnight weight gain of >2 pounds or a weekly weight weight of >5 pounds - not adding salt to his food and has been reading food labels. Reviewed the importance of keeping daily sodium intake to 2000mg  daily       Type 2 diabetes mellitus with cardiac complication (HCC) - Primary    Chronic, ongoing with HF.  A1C today 7.2%, slight trend upwards.  Continue current medication regimen  and adjust as needed + continue collaboration with cardiology.  Recommend he check BS at home at least a few mornings a week.  He is to focus on diet heavily over next three months to reach A1C goal <7.  Continue to collaborate with CCM crew.  May increase Invokana to 300 MG next visit if continues above goal OR add on GLP1.  At this time he wishes to continue current regimen.  Return to office in 3 months.      Relevant Orders   Bayer DCA Hb A1c Waived (Completed)     Endocrine   Hyperlipidemia associated with type 2 diabetes mellitus ([place])  Chronic, ongoing.  Continue current medication regimen and adjust as needed.  Would benefit from statin use with diabetes and heart health or consider injectable, discussed with him today.  LDL last visit was 79.  Lipid panel today.      Relevant Orders   Bayer DCA Hb A1c Waived (Completed)   Lipid Panel w/o Chol/HDL Ratio   CKD stage 3 due to type 2 diabetes mellitus ([place])    Noted on recent labs.  Recheck BMP today and monitor closely.  Consider referral to nephrology if worsening.          Follow up plan: Return in about 3 months (around 02/28/2020) for T2DM, HTN/HLD.

## 2019-11-28 NOTE — Patient Instructions (Signed)

## 2019-11-29 LAB — BASIC METABOLIC PANEL
BUN/Creatinine Ratio: 12 (ref 10–24)
BUN: 16 mg/dL (ref 8–27)
CO2: 24 mmol/L (ref 20–29)
Calcium: 9.8 mg/dL (ref 8.6–10.2)
Chloride: 102 mmol/L (ref 96–106)
Creatinine, Ser: 1.37 mg/dL — ABNORMAL HIGH (ref 0.76–1.27)
GFR calc Af Amer: 60 mL/min/{1.73_m2} (ref >59)
GFR calc non Af Amer: 52 mL/min/{1.73_m2} — ABNORMAL LOW (ref >59)
Glucose: 209 mg/dL — ABNORMAL HIGH (ref 65–99)
Potassium: 4.6 mmol/L (ref 3.5–5.2)
Sodium: 140 mmol/L (ref 134–144)

## 2019-11-29 LAB — LIPID PANEL W/O CHOL/HDL RATIO
Cholesterol, Total: 156 mg/dL (ref 100–199)
HDL: 35 mg/dL — ABNORMAL LOW (ref >39)
LDL Chol Calc (NIH): 82 mg/dL (ref 0–99)
Triglycerides: 232 mg/dL — ABNORMAL HIGH (ref 0–149)
VLDL Cholesterol Cal: 39 mg/dL (ref 5–40)

## 2019-11-29 NOTE — Progress Notes (Signed)
Contacted via [place] morning [place], your labs have returned.  You continue to show some mild kidney disease, but no worsening.  We will continue to monitor.  Cholesterol levels with LDL 82, would be good to see this less than 70. A statin may be beneficial in future, we will recheck next visit and discuss further.  Have a great day!! Keep being awesome!! Kindest regards, Merced Hanners

## 2019-12-07 ENCOUNTER — Other Ambulatory Visit: Payer: Self-pay | Admitting: Nurse Practitioner

## 2019-12-11 ENCOUNTER — Telehealth: Payer: PRIVATE HEALTH INSURANCE

## 2019-12-19 ENCOUNTER — Telehealth: Payer: Self-pay | Admitting: *Deleted

## 2019-12-19 NOTE — Chronic Care Management (AMB) (Signed)
  Chronic Care Management   Note  12/19/2019 Name: Ryan [name] [name]: [id number] DOB: 1950/07/01  Ryan [name] is a 69 y.o. year old male who is a primary care patient of Venita Lick, NP and is actively engaged with the care management team. I reached out to Ryan [name] by phone today to assist with re-scheduling a follow up visit with the Pharmacist. Follow up plan: Telephone appointment with care management team member scheduled for: 12/24/19.  [place], [place] [address] Direct Dial: [phone] [email or web address]

## 2019-12-24 ENCOUNTER — Ambulatory Visit: Payer: Medicare Other | Admitting: Pharmacist

## 2019-12-24 DIAGNOSIS — E1169 Type 2 diabetes mellitus with other specified complication: Secondary | ICD-10-CM

## 2019-12-24 DIAGNOSIS — E1159 Type 2 diabetes mellitus with other circulatory complications: Secondary | ICD-10-CM

## 2019-12-25 ENCOUNTER — Telehealth: Payer: PRIVATE HEALTH INSURANCE

## 2019-12-29 NOTE — Chronic Care Management (AMB) (Signed)
Chronic Care Management   Follow Up Note   12/29/2019 Name: Ryan [name] [name]: [id number] DOB: June 01, 1950  Referred by: Ryan Lick, NP Reason for referral : Chronic Care Management ((follow up, HF, DM))   Ryan [name] is a 69 y.o. year old male who is a primary care patient of Cannady, Ryan Faster, NP. The CCM team was consulted for assistance with chronic disease management and care coordination needs.    Review of patient status, including review of consultants reports, relevant laboratory and other test results, and collaboration with appropriate care team members and the patient's provider was performed as part of comprehensive patient evaluation and provision of chronic care management services.    SDOH (Social Determinants of Health) assessments performed: No See Care Plan activities for detailed interventions related to [facility])     Outpatient Encounter Medications as of 12/24/2019  Medication Sig   canagliflozin (INVOKANA) 100 MG TABS tablet Take 1 tablet (100 mg total) by mouth daily before breakfast.   Cholecalciferol (VITAMIN D3 SUPER STRENGTH) 50 MCG (2000 UT) CAPS Take 2,000 Units by mouth daily.   dexlansoprazole (DEXILANT) 60 MG capsule Take 1 capsule (60 mg total) by mouth daily.   ENTRESTO 24-26 MG Take 1 tablet by mouth 2 (two) times daily.   febuxostat (ULORIC) 40 MG tablet Take 1 tablet (40 mg total) by mouth daily.   levothyroxine (SYNTHROID) 50 MCG tablet Take 1 tablet (50 mcg total) by mouth daily.   spironolactone (ALDACTONE) 25 MG tablet Take 1 tablet (25 mg total) by mouth daily. (Patient taking differently: 12.5 mg. Take 1 tablet (12.5 mg total) by mouth daily.)   carvedilol (COREG) 12.5 MG tablet Take 1 tablet (12.5 mg total) by mouth 2 (two) times daily.   No facility-administered encounter medications on file as of 12/24/2019.     Objective:   Goals Addressed              This Visit's Progress     PharmD "I can't afford this medication"  (pt-stated)        CARE PLAN ENTRY (see longtitudinal plan of care for additional care plan information)  Current Barriers:   Polypharmacy; complex patient with multiple comorbidities including hx HFrEF, T2DM, HTN, HLD  Receiving patient assistance for SGLT2. Patient submitted requested 2020 tax forms and has received medication..  Cost concerns w/ Entresto - relieved; receiving Entresto through patient assistance  Most recent eGFR: 52 mL/min o HFrEF: Entresto 24/26 mg BID, carvedilol 12.5 mg BID, spironolactone 12.5 mg daily o T2DM/HFrEF: Invokana 100 mg daily; A1c 7.2%.  o ASCVD risk reduction: 10 year ASCVD risk score 27%. Hx statin intolerance to pravastatin and rosuvastatin w/ significant joint pain. No hx ezetimibe o Gout: febuxostat 40 mg daily, last uric acid at goal <6 o Hypothyroidism: levothyroxine 50 mcg daily o GERD: Dexlansoprazole 60 mg daily  Pharmacist Clinical Goal(s):   Over the next 90 days, patient will work with PharmD and provider towards optimized medication management  Interventions:  Comprehensive medication review performed, medication list updated in electronic medical record  Inter-disciplinary care team collaboration (see longitudinal plan of care)  Patient Self Care Activities:   Patient will take medications as prescribed  Please see past updates related to this goal by clicking on the "Past Updates" button in the selected goal          Plan:   Telephone follow up appointment with care management team member scheduled for: 4 months   Ryan [name].  Ryan [name] PharmD, [facility] [phone]

## 2019-12-29 NOTE — Patient Instructions (Addendum)
Visit Information  It was a pleasure speaking with you today. Thank you for letting me be part of your clinical team. Please call with any questions or concerns.   Goals Addressed              This Visit's Progress   .  PharmD "I can't afford this medication" (pt-stated)        CARE PLAN ENTRY (see longtitudinal plan of care for additional care plan information)  Current Barriers:  . Polypharmacy; complex patient with multiple comorbidities including hx HFrEF, T2DM, HTN, HLD . Receiving patient assistance for SGLT2. Patient submitted requested 2020 tax forms and has received medication.. . Cost concerns w/ Entresto - relieved; receiving Entresto through patient assistance . Most recent eGFR: 52 mL/min o HFrEF: Entresto 24/26 mg BID, carvedilol 12.5 mg BID, spironolactone 12.5 mg daily o T2DM/HFrEF: Invokana 100 mg daily; A1c 7.2%.  o ASCVD risk reduction: 10 year ASCVD risk score 27%. Hx statin intolerance to pravastatin and rosuvastatin w/ significant joint pain. No hx ezetimibe o Gout: febuxostat 40 mg daily, last uric acid at goal <6 o Hypothyroidism: levothyroxine 50 mcg daily o GERD: Dexlansoprazole 60 mg daily  Pharmacist Clinical Goal(s):  Marland Kitchen Over the next 90 days, patient will work with PharmD and provider towards optimized medication management  Interventions: . Comprehensive medication review performed, medication list updated in electronic medical record . Inter-disciplinary care team collaboration (see longitudinal plan of care)  Patient Self Care Activities:  . Patient will take medications as prescribed  Please see past updates related to this goal by clicking on the "Past Updates" button in the selected goal         The patient verbalized understanding of instructions provided today and agreed to receive a mailed copy of patient instruction and/or educational materials.  Telephone follow up appointment with pharmacy team member scheduled for: 03/21/20 @ 10  AM  Junita Push. Frenchie Pribyl PharmD, BCPS Clinical Pharmacist [phone]  High Cholesterol  High cholesterol is a condition in which the blood has high levels of a white, waxy, fat-like substance (cholesterol). The human body needs small amounts of cholesterol. The liver makes all the cholesterol that the body needs. Extra (excess) cholesterol comes from the food that we eat. Cholesterol is carried from the liver by the blood through the blood vessels. If you have high cholesterol, deposits (plaques) may build up on the walls of your blood vessels (arteries). Plaques make the arteries narrower and stiffer. Cholesterol plaques increase your risk for heart attack and stroke. Work with your health care provider to keep your cholesterol levels in a healthy range. What increases the risk? This condition is more likely to develop in people who:  Eat foods that are high in animal fat (saturated fat) or cholesterol.  Are overweight.  Are not getting enough exercise.  Have a family history of high cholesterol. What are the signs or symptoms? There are no symptoms of this condition. How is this diagnosed? This condition may be diagnosed from the results of a blood test.  If you are older than age 58, your health care provider may check your cholesterol every 4-6 years.  You may be checked more often if you already have high cholesterol or other risk factors for heart disease. The blood test for cholesterol measures:  "Bad" cholesterol (LDL cholesterol). This is the main type of cholesterol that causes heart disease. The desired level for LDL is less than 100.  "Good" cholesterol (HDL cholesterol). This type helps  to protect against heart disease by cleaning the arteries and carrying the LDL away. The desired level for HDL is 60 or higher.  Triglycerides. These are fats that the body can store or burn for energy. The desired number for triglycerides is lower than 150.  Total cholesterol. This is a  measure of the total amount of cholesterol in your blood, including LDL cholesterol, HDL cholesterol, and triglycerides. A healthy number is less than 200. How is this treated? This condition is treated with diet changes, lifestyle changes, and medicines. Diet changes  This may include eating more whole grains, fruits, vegetables, nuts, and fish.  This may also include cutting back on red meat and foods that have a lot of added sugar. Lifestyle changes  Changes may include getting at least 40 minutes of aerobic exercise 3 times a week. Aerobic exercises include walking, biking, and swimming. Aerobic exercise along with a healthy diet can help you maintain a healthy weight.  Changes may also include quitting smoking. Medicines  Medicines are usually given if diet and lifestyle changes have failed to reduce your cholesterol to healthy levels.  Your health care provider may prescribe a statin medicine. Statin medicines have been shown to reduce cholesterol, which can reduce the risk of heart disease. Follow these instructions at home: Eating and drinking If told by your health care provider:  Eat chicken (without skin), fish, veal, shellfish, ground [place] breast, and round or loin cuts of red meat.  Do not eat fried foods or fatty meats, such as hot dogs and salami.  Eat plenty of fruits, such as apples.  Eat plenty of vegetables, such as broccoli, potatoes, and carrots.  Eat beans, peas, and lentils.  Eat grains such as barley, rice, couscous, and bulgur wheat.  Eat pasta without cream sauces.  Use skim or nonfat milk, and eat low-fat or nonfat yogurt and cheeses.  Do not eat or drink whole milk, cream, ice cream, egg yolks, or hard cheeses.  Do not eat stick margarine or tub margarines that contain trans fats (also called partially hydrogenated oils).  Do not eat saturated tropical oils, such as coconut oil and palm oil.  Do not eat cakes, cookies, crackers, or other baked  goods that contain trans fats.  General instructions  Exercise as directed by your health care provider. Increase your activity level with activities such as gardening, walking, and taking the stairs.  Take over-the-counter and prescription medicines only as told by your health care provider.  Do not use any products that contain nicotine or tobacco, such as cigarettes and e-cigarettes. If you need help quitting, ask your health care provider.  Keep all follow-up visits as told by your health care provider. This is important. Contact a health care provider if:  You are struggling to maintain a healthy diet or weight.  You need help to start on an exercise program.  You need help to stop smoking. Get help right away if:  You have chest pain.  You have trouble breathing. This information is not intended to replace advice given to you by your health care provider. Make sure you discuss any questions you have with your health care provider. Document Revised: 04/29/2017 Document Reviewed: 10/25/2015 Elsevier Patient Education  [place].

## 2020-01-16 ENCOUNTER — Telehealth: Payer: Self-pay | Admitting: Nurse Practitioner

## 2020-01-16 NOTE — Telephone Encounter (Signed)
Return call to patient and spoke to him. Patient stated he is going out of town this coming Monday for a few weeks and would like to ask [name] if he can get a Rx for hydroxychloroquin and Avermectin before he leaves just in case he needs some of this medications while he is gone. Please advise

## 2020-01-16 NOTE — Telephone Encounter (Signed)
Copied from [place] [id number]. Topic: General - Inquiry >> Jan 16, 2020  1:08 PM Gillis Ends D wrote: Reason for CRM: Patient would like to receive a call back about some side effects of some medication. Call him back at [phone]. Please advise the patient

## 2020-01-16 NOTE — Telephone Encounter (Signed)
Please let Ryan [name] know that these medications are not recommended outside of clinical trials at this time and are not to be prescribed for Covid therapy or prevention, unless within clinical trial setting.  I do recommend if traveling to ensure he wears mask when around group settings or indoors + highly recommend Covid vaccine for prevention, this is showing decreased hospitalization rate even in those who do have breakthrough cases with the vaccine.  Thank you.

## 2020-01-17 NOTE — Telephone Encounter (Signed)
Called and LVM for patient to return my call.

## 2020-01-17 NOTE — Telephone Encounter (Signed)
Patient returned my call and spoke with him. Message relayed and patient verbalized understanding.

## 2020-01-21 ENCOUNTER — Telehealth: Payer: Self-pay | Admitting: Nurse Practitioner

## 2020-01-21 NOTE — Telephone Encounter (Signed)
Copied from [place] [id number]. Topic: Medicare AWV >> Jan 21, 2020 11:58 AM Cher Nakai R wrote: Reason for CRM:   Left message for patient to call back and schedule the Medicare Annual Wellness Visit (AWV) virtually.  Last AWV 01/17/2019  Please schedule at anytime with CFP-Nurse Health Advisor.  45 minute appointment  Any questions, please call me at [phone]

## 2020-01-30 ENCOUNTER — Ambulatory Visit (INDEPENDENT_AMBULATORY_CARE_PROVIDER_SITE_OTHER): Payer: Medicare Other | Admitting: *Deleted

## 2020-01-30 DIAGNOSIS — I428 Other cardiomyopathies: Secondary | ICD-10-CM

## 2020-01-30 DIAGNOSIS — I429 Cardiomyopathy, unspecified: Secondary | ICD-10-CM

## 2020-01-30 LAB — CUP PACEART REMOTE DEVICE CHECK
Battery Remaining Longevity: 34 mo
Battery Voltage: 2.95 V
Brady Statistic AP VP Percent: 11.86 %
Brady Statistic AP VS Percent: 0.01 %
Brady Statistic AS VP Percent: 88.13 %
Brady Statistic AS VS Percent: 0 %
Brady Statistic RA Percent Paced: 11.85 %
Brady Statistic RV Percent Paced: 99.98 %
Date Time Interrogation Session: 20210922093326
HighPow Impedance: 52 Ohm
HighPow Impedance: 63 Ohm
Implantable Lead Implant Date: 20060505
Implantable Lead Implant Date: 20120111
Implantable Lead Implant Date: 20180314
Implantable Lead Location: 753858
Implantable Lead Location: 753859
Implantable Lead Location: 753860
Implantable Lead Model: 5076
Implantable Lead Model: 6947
Implantable Pulse Generator Implant Date: 20180314
Lead Channel Impedance Value: 241.412
Lead Channel Impedance Value: 245.538
Lead Channel Impedance Value: 246.635
Lead Channel Impedance Value: 250.943
Lead Channel Impedance Value: 261.164
Lead Channel Impedance Value: 456 Ohm
Lead Channel Impedance Value: 456 Ohm
Lead Channel Impedance Value: 475 Ohm
Lead Channel Impedance Value: 513 Ohm
Lead Channel Impedance Value: 532 Ohm
Lead Channel Impedance Value: 532 Ohm
Lead Channel Impedance Value: 589 Ohm
Lead Channel Impedance Value: 722 Ohm
Lead Channel Impedance Value: 817 Ohm
Lead Channel Impedance Value: 817 Ohm
Lead Channel Impedance Value: 836 Ohm
Lead Channel Impedance Value: 836 Ohm
Lead Channel Impedance Value: 931 Ohm
Lead Channel Pacing Threshold Amplitude: 0.625 V
Lead Channel Pacing Threshold Amplitude: 0.625 V
Lead Channel Pacing Threshold Amplitude: 1.375 V
Lead Channel Pacing Threshold Pulse Width: 0.4 ms
Lead Channel Pacing Threshold Pulse Width: 0.4 ms
Lead Channel Pacing Threshold Pulse Width: 0.4 ms
Lead Channel Sensing Intrinsic Amplitude: 1.125 mV
Lead Channel Sensing Intrinsic Amplitude: 1.125 mV
Lead Channel Setting Pacing Amplitude: 1.25 V
Lead Channel Setting Pacing Amplitude: 1.5 V
Lead Channel Setting Pacing Amplitude: 2 V
Lead Channel Setting Pacing Pulse Width: 0.4 ms
Lead Channel Setting Pacing Pulse Width: 0.4 ms
Lead Channel Setting Sensing Sensitivity: 0.3 mV

## 2020-02-01 NOTE — Progress Notes (Signed)
Remote ICD transmission.   

## 2020-02-05 ENCOUNTER — Other Ambulatory Visit: Payer: Self-pay | Admitting: Nurse Practitioner

## 2020-02-06 ENCOUNTER — Telehealth: Payer: PRIVATE HEALTH INSURANCE

## 2020-02-06 ENCOUNTER — Telehealth: Payer: Self-pay | Admitting: General Practice

## 2020-02-06 NOTE — Telephone Encounter (Signed)
  Chronic Care Management   Outreach Note  02/06/2020 Name: Ryan [name] [name]: [id number] DOB: [birth year]  Referred by: Venita Lick, NP Reason for referral : Chronic Care Management (RNCM Initial outreach call for Chronic disease Managment and Care coordination needs)   An unsuccessful telephone outreach was attempted today. The patient was referred to the case management team for assistance with care management and care coordination.   Follow Up Plan: The care management team will reach out to the patient again over the next 30 to 60 days.   Noreene Larsson RN, MSN, [facility] Mobile: [phone]

## 2020-02-11 NOTE — Telephone Encounter (Signed)
Pt has been r/s  

## 2020-02-12 ENCOUNTER — Encounter: Payer: Self-pay | Admitting: Internal Medicine

## 2020-02-12 ENCOUNTER — Other Ambulatory Visit: Payer: Self-pay

## 2020-02-12 ENCOUNTER — Ambulatory Visit (INDEPENDENT_AMBULATORY_CARE_PROVIDER_SITE_OTHER): Payer: Medicare Other | Admitting: Internal Medicine

## 2020-02-12 VITALS — BP 130/84 | HR 60 | Ht 72.24 in | Wt 198.0 lb

## 2020-02-12 DIAGNOSIS — I428 Other cardiomyopathies: Secondary | ICD-10-CM | POA: Diagnosis not present

## 2020-02-12 DIAGNOSIS — I5022 Chronic systolic (congestive) heart failure: Secondary | ICD-10-CM | POA: Diagnosis not present

## 2020-02-12 DIAGNOSIS — I10 Essential (primary) hypertension: Secondary | ICD-10-CM | POA: Diagnosis not present

## 2020-02-12 DIAGNOSIS — Z95810 Presence of automatic (implantable) cardiac defibrillator: Secondary | ICD-10-CM | POA: Diagnosis not present

## 2020-02-12 DIAGNOSIS — I480 Paroxysmal atrial fibrillation: Secondary | ICD-10-CM | POA: Diagnosis not present

## 2020-02-12 LAB — CUP PACEART INCLINIC DEVICE CHECK
Battery Remaining Longevity: 35 mo
Battery Voltage: 2.95 V
Brady Statistic AP VP Percent: 13.34 %
Brady Statistic AP VS Percent: 0.01 %
Brady Statistic AS VP Percent: 86.65 %
Brady Statistic AS VS Percent: 0 %
Brady Statistic RA Percent Paced: 13.31 %
Brady Statistic RV Percent Paced: 99.98 %
Date Time Interrogation Session: 20211005095551
HighPow Impedance: 52 Ohm
HighPow Impedance: 68 Ohm
Implantable Lead Implant Date: 20060505
Implantable Lead Implant Date: 20120111
Implantable Lead Implant Date: 20180314
Implantable Lead Location: 753858
Implantable Lead Location: 753859
Implantable Lead Location: 753860
Implantable Lead Model: 5076
Implantable Lead Model: 6947
Implantable Pulse Generator Implant Date: 20180314
Lead Channel Impedance Value: 1007 Ohm
Lead Channel Impedance Value: 241.412
Lead Channel Impedance Value: 241.412
Lead Channel Impedance Value: 257.018
Lead Channel Impedance Value: 257.018
Lead Channel Impedance Value: 274.19 Ohm
Lead Channel Impedance Value: 456 Ohm
Lead Channel Impedance Value: 456 Ohm
Lead Channel Impedance Value: 456 Ohm
Lead Channel Impedance Value: 513 Ohm
Lead Channel Impedance Value: 589 Ohm
Lead Channel Impedance Value: 589 Ohm
Lead Channel Impedance Value: 665 Ohm
Lead Channel Impedance Value: 722 Ohm
Lead Channel Impedance Value: 836 Ohm
Lead Channel Impedance Value: 836 Ohm
Lead Channel Impedance Value: 874 Ohm
Lead Channel Impedance Value: 950 Ohm
Lead Channel Pacing Threshold Amplitude: 0.625 V
Lead Channel Pacing Threshold Amplitude: 0.625 V
Lead Channel Pacing Threshold Amplitude: 1.25 V
Lead Channel Pacing Threshold Pulse Width: 0.4 ms
Lead Channel Pacing Threshold Pulse Width: 0.4 ms
Lead Channel Pacing Threshold Pulse Width: 0.4 ms
Lead Channel Sensing Intrinsic Amplitude: 0.5 mV
Lead Channel Sensing Intrinsic Amplitude: 2.25 mV
Lead Channel Setting Pacing Amplitude: 1.25 V
Lead Channel Setting Pacing Amplitude: 1.5 V
Lead Channel Setting Pacing Amplitude: 2 V
Lead Channel Setting Pacing Pulse Width: 0.4 ms
Lead Channel Setting Pacing Pulse Width: 0.4 ms
Lead Channel Setting Sensing Sensitivity: 0.3 mV

## 2020-02-12 LAB — PACEMAKER DEVICE OBSERVATION

## 2020-02-12 NOTE — Progress Notes (Signed)
skf      Patient Care Team: Venita Lick, NP as PCP - General (Nurse Practitioner) Carloyn Manner, MD as Referring Physician (Otolaryngology) Deboraha Sprang, MD as Consulting Physician (Cardiology) Vladimir Faster, [facility] (Pharmacist)   HPI  Ryan [name] is a 69 y.o. male Seen in followup for an ICD implanted for syncope in the setting of a nonischemic cardiomyopathy. He is now device dependent. He had 6949 lead in place and underwent generator replacement with a new 6947-lead January 2012   Underwent CRT upgrade 3/18   The patient denies chest pain, shortness of breath, nocturnal dyspnea, orthopnea or peripheral edema.  There have been no palpitations, lightheadedness or syncope  Has lost about 30 pounds.Marland Kitchen    DATE TEST EF   3/14 Echo 40-45 %   2/18 Echo 35-40 %   3/19 Echo   40-45%       Date Cr K Hgb TSH  3/16  1.31 4.3     12/18 1.34 4.6 16.5 5.01  7/19 1.37 4.1    9/20 1.2 4.2 16.3 (1/20) 3.93  7/21 1.37 4.6 16.6 (1/21)         Past Medical History:  Diagnosis Date  . AICD (automatic cardioverter/defibrillator) present   . Anxiety   . Complete heart block ([place])   . Depression   . Diabetes mellitus, type 2 ([place])   . Dual ICD (implantable cardiac defibrillator-Medtronic    a. [id number] implanted originally s/p [phone] lead & generator change out 05/2010; b. upgraded to MDT CRT-D in 07/2016  . GERD (gastroesophageal reflux disease)   . Gout   . History of kidney stones   . Hypertension   . IBS (irritable bowel syndrome)   . Kidney stones   . Nonischemic cardiomyopathy ([place])    a. Echo 07/2012 EF 35-40%, severe HK of the mid-distalanterior myocardium w/ AK of the distalinferior myocardium, GR1DD, mild MR, mild to mod dilated LA; b/ echo 06/2016 EF 35-40%, severe HK of the mid-apicalanteroseptal and anterior myocardium and severe HK of the apicalinferior myocardium along with AK of the apical myocardium, GR1DD, calcified mitral annulus, mildly dilated left atrium  .  Paroxysmal atrial fibrillation ([place])    a. CHADS2VASc => 3 (CHF, HTN, age x 1); b. on ASA only  . Presence of permanent cardiac pacemaker   . Syncope     Past Surgical History:  Procedure Laterality Date  . BIV UPGRADE N/A 07/21/2016   Procedure: BiV Upgrade;  Surgeon: Deboraha Sprang, MD;  Location: [place] [facility];  Service: Cardiovascular;  Laterality: N/A;  . CARDIAC DEFIBRILLATOR PLACEMENT    . DIRECT LARYNGOSCOPY Right 07/14/2017   Procedure: MICRODIRECT LARYNGOSCOPY WITH EXCISION OF RIGHT VOCAL CORD LESION WITH MICRO FLAP TECHNIQUE;  Surgeon: Carloyn Manner, MD;  Location: [facility];  Service: ENT;  Laterality: Right;  . LARYNGOSCOPY Right 01/06/2017   Procedure: LARYNGOSCOPY WITH EXCISION OF RIGHT VOCAL CORD LESION;  Surgeon: Carloyn Manner, MD;  Location: [facility];  Service: ENT;  Laterality: Right;  . LITHOTRIPSY    . UPPER ENDOSCOPY W/ ESOPHAGEAL MANOMETRY      Current Outpatient Medications  Medication Sig Dispense Refill  . canagliflozin (INVOKANA) 100 MG TABS tablet Take 1 tablet (100 mg total) by mouth daily before breakfast. 90 tablet 4  . Cholecalciferol (VITAMIN D3 SUPER STRENGTH) 50 MCG (2000 UT) CAPS Take 2,000 Units by mouth daily.    Marland Kitchen dexlansoprazole (DEXILANT) 60 MG capsule Take 1 capsule (60 mg total) by mouth  daily. 90 capsule 4  . ENTRESTO 24-26 MG Take 1 tablet by mouth 2 (two) times daily. 180 tablet 1  . febuxostat (ULORIC) 40 MG tablet Take 1 tablet (40 mg total) by mouth daily. 90 tablet 4  . levothyroxine (SYNTHROID) 50 MCG tablet Take 1 tablet (50 mcg total) by mouth daily. 90 tablet 3  . spironolactone (ALDACTONE) 25 MG tablet Take 12.5 mg by mouth daily.    . carvedilol (COREG) 12.5 MG tablet Take 1 tablet (12.5 mg total) by mouth 2 (two) times daily. 180 tablet 3   No current facility-administered medications for this visit.    Allergies  Allergen Reactions  . Ace Inhibitors     Muscle aches   . Crestor [Rosuvastatin Calcium] Other (See  Comments)    Muscle aches    Review of Systems negative except from HPI and PMH  Physical Exam BP 130/84   Pulse 60   Ht 6' 0.24" (1.835 m)   Wt 198 lb (89.8 kg)   BMI 26.68 kg/m  Well developed and well nourished in no acute distress HENT normal Neck supple with JVP-flat Clear Device pocket well healed; without hematoma or erythema.  There is no tethering  Regular rate and rhythm, no  gallop No  murmur Abd-soft with active BS No Clubbing cyanosis  edema Skin-warm and dry A & Oriented  Grossly normal sensory and motor function  ECG AV pacing with an upright QRS lead V1 and negative QRS lead I   Assessment and  Plan  Nonischemic cardiomyopathy     Complete heart block   Paroxysmal atrial fibrillation    Status post ICD-Medtronic CRT    Chronic systolic heart failure     Hypertension   Renal insufficiency  Gd 2   Blood pressures well controlled  No interval atrial fibrillation  Concerns about the role of spironolactone.  Reviewed its relationship to decrease mortality in people with depressed left ventricular function and the lack of data, of which I am aware anyway, of its removal in people of had recovered ejection fraction.  In this regard our data supporting ongoing use of beta-blockers and ACE/ARB and hence would presume the absence of symptoms that is likely beneficial to continue the spironolactone.  Was concerned about his renal function.  Calculation gives him grade 2 renal dysfunction at this point reviewed up-to-date on things that he can do in addition to his SGLT2, ACE/ARB, encourage perhaps less protein.

## 2020-02-12 NOTE — Patient Instructions (Signed)
Medication Instructions:  - Your physician recommends that you continue on your current medications as directed. Please refer to the Current Medication list given to you today.  *If you need a refill on your cardiac medications before your next appointment, please call your pharmacy*   Lab Work: - none ordered  If you have labs (blood work) drawn today and your tests are completely normal, you will receive your results only by: . MyChart Message (if you have MyChart) OR . A paper copy in the mail If you have any lab test that is abnormal or we need to change your treatment, we will call you to review the results.   Testing/Procedures: - none ordered   Follow-Up: At [facility], you and your health needs are our priority.  As part of our continuing mission to provide you with exceptional heart care, we have created designated Provider Care Teams.  These Care Teams include your primary Cardiologist (physician) and Advanced Practice Providers (APPs -  Physician Assistants and Nurse Practitioners) who all work together to provide you with the care you need, when you need it.  We recommend signing up for the patient portal called "MyChart".  Sign up information is provided on this After Visit Summary.  MyChart is used to connect with patients for Virtual Visits (Telemedicine).  Patients are able to view lab/test results, encounter notes, upcoming appointments, etc.  Non-urgent messages can be sent to your provider as well.   To learn more about what you can do with MyChart, go to [email or web address].    Your next appointment:   1 year(s)  The format for your next appointment:   In Person  Provider:   Arsalan Klein, MD   Other Instructions n/a  

## 2020-02-28 ENCOUNTER — Encounter: Payer: Self-pay | Admitting: Nurse Practitioner

## 2020-02-28 ENCOUNTER — Ambulatory Visit (INDEPENDENT_AMBULATORY_CARE_PROVIDER_SITE_OTHER): Payer: Medicare Other | Admitting: Nurse Practitioner

## 2020-02-28 ENCOUNTER — Other Ambulatory Visit: Payer: Self-pay

## 2020-02-28 VITALS — BP 132/84 | HR 66 | Temp 98.1°F | Resp 16 | Wt 196.0 lb

## 2020-02-28 DIAGNOSIS — M791 Myalgia, unspecified site: Secondary | ICD-10-CM | POA: Diagnosis not present

## 2020-02-28 DIAGNOSIS — E1159 Type 2 diabetes mellitus with other circulatory complications: Secondary | ICD-10-CM | POA: Diagnosis not present

## 2020-02-28 DIAGNOSIS — I152 Hypertension secondary to endocrine disorders: Secondary | ICD-10-CM | POA: Diagnosis not present

## 2020-02-28 DIAGNOSIS — E1169 Type 2 diabetes mellitus with other specified complication: Secondary | ICD-10-CM

## 2020-02-28 DIAGNOSIS — E785 Hyperlipidemia, unspecified: Secondary | ICD-10-CM | POA: Diagnosis not present

## 2020-02-28 DIAGNOSIS — I5020 Unspecified systolic (congestive) heart failure: Secondary | ICD-10-CM

## 2020-02-28 DIAGNOSIS — E1122 Type 2 diabetes mellitus with diabetic chronic kidney disease: Secondary | ICD-10-CM | POA: Diagnosis not present

## 2020-02-28 DIAGNOSIS — N183 Chronic kidney disease, stage 3 unspecified: Secondary | ICD-10-CM | POA: Diagnosis not present

## 2020-02-28 DIAGNOSIS — T466X5A Adverse effect of antihyperlipidemic and antiarteriosclerotic drugs, initial encounter: Secondary | ICD-10-CM

## 2020-02-28 LAB — BAYER DCA HB A1C WAIVED: HB A1C (BAYER DCA - WAIVED): 6.9 % (ref <7.0)

## 2020-02-28 NOTE — Progress Notes (Signed)
BP 132/84 (BP Location: Left Arm, Patient Position: Sitting, Cuff Size: Normal)   Pulse 66   Temp 98.1 F (36.7 C) (Oral)   Resp 16   Wt 196 lb (88.9 kg)   SpO2 99%   BMI 26.41 kg/m    Subjective:    Patient ID: Ryan [name], male    DOB: [birth year], 69 y.o.   [name]: [id number]  HPI: Ryan [name] is a 69 y.o. male  Chief Complaint  Patient presents with  . Diabetes  . Hypertension  . Hyperlipidemia   DIABETES Last A1C 7.2%. Takes Invokana. Had intolerance to Metformin. Hypoglycemic episodes:no Polydipsia/polyuria:no Visual disturbance:no Chest pain:no Paresthesias:no Glucose Monitoring:no Accucheck frequency: Not Checking Fasting glucose: Post prandial: Evening: Before meals: Taking Insulin?:no Long acting insulin: Short acting insulin: Blood Pressure Monitoring:daily Retinal Examination:Not Up to Date - reports eye exam in June -- [place] Exam:Up to Date Pneumovax:took several years Influenza:refused Aspirin:no  HYPERTENSION / HYPERLIPIDEMIA/HF Is followed by cardiology, Dr. Caryl Comes, last saw 02/12/20. Continues on Aldactone, Entresto, Coreg.  No current statin as reported cardiology took him off this.  Tried Pravastatin in past with poor response, with terrible joint pain.  Satisfied with current treatment?yes Duration of hypertension:chronic BP monitoring frequency:a few times a week BP range:117/73 on average BP medication side effects:no Duration of hyperlipidemia:chronic Medication compliance:good compliance Aspirin:no Recent stressors:no Recurrent headaches:no Visual changes:no Palpitations:no Dyspnea:no Chest pain:no Lower extremity edema:no Dizzy/lightheaded:no The 10-year ASCVD risk score Mikey Bussing DC Jr., et al., 2013) is: 36.8%   Values used to calculate the score:     Age: 52 years     Sex: Male     Is Non-Hispanic  African American: No     Diabetic: Yes     Tobacco smoker: No     Systolic Blood Pressure: 496 mmHg     Is BP treated: Yes     HDL Cholesterol: 35 mg/dL     Total Cholesterol: 156 mg/dL  Relevant past medical, surgical, family and social history reviewed and updated as indicated. Interim medical history since our last visit reviewed. Allergies and medications reviewed and updated.  Review of Systems  Constitutional: Negative for activity change, diaphoresis, fatigue and fever.  Respiratory: Negative for cough, chest tightness, shortness of breath and wheezing.   Cardiovascular: Negative for chest pain, palpitations and leg swelling.  Gastrointestinal: Negative.   Endocrine: Negative for polydipsia, polyphagia and polyuria.  Neurological: Negative.   Psychiatric/Behavioral: Negative.     Per HPI unless specifically indicated above     Objective:    BP 132/84 (BP Location: Left Arm, Patient Position: Sitting, Cuff Size: Normal)   Pulse 66   Temp 98.1 F (36.7 C) (Oral)   Resp 16   Wt 196 lb (88.9 kg)   SpO2 99%   BMI 26.41 kg/m   Wt Readings from Last 3 Encounters:  02/28/20 196 lb (88.9 kg)  02/12/20 198 lb (89.8 kg)  11/28/19 199 lb (90.3 kg)    Physical Exam Vitals and nursing note reviewed.  Constitutional:      General: He is awake. He is not in acute distress.    Appearance: He is well-developed and well-groomed. He is not ill-appearing.  HENT:     Head: Normocephalic and atraumatic.     Right Ear: Hearing normal. No drainage.     Left Ear: Hearing normal. No drainage.  Eyes:     General: Lids are normal.        Right eye: No discharge.  Left eye: No discharge.     Conjunctiva/sclera: Conjunctivae normal.     Pupils: Pupils are equal, round, and reactive to light.  Neck:     Vascular: No carotid bruit.  Cardiovascular:     Rate and Rhythm: Normal rate and regular rhythm.     Heart sounds: Normal heart sounds, S1 normal and S2 normal. No murmur  heard.  No gallop.   Pulmonary:     Effort: Pulmonary effort is normal. No accessory muscle usage or respiratory distress.     Breath sounds: Normal breath sounds.  Abdominal:     General: Bowel sounds are normal.     Palpations: Abdomen is soft.  Musculoskeletal:        General: Normal range of motion.     Cervical back: Normal range of motion and neck supple.     Right lower leg: No edema.     Left lower leg: No edema.  Skin:    General: Skin is warm and dry.     Capillary Refill: Capillary refill takes less than 2 seconds.  Neurological:     Mental Status: He is alert and oriented to person, place, and time.  Psychiatric:        Attention and Perception: Attention normal.        Mood and Affect: Mood normal.        Speech: Speech normal.        Behavior: Behavior normal. Behavior is cooperative.        Thought Content: Thought content normal.    Results for orders placed or performed in visit on 02/12/20  Pacemaker Device Observation  Result Value Ref Range   PACEART PDF     DEVICE MODEL PM     BATTERY VOLTAGE        Assessment & Plan:   Problem List Items Addressed This Visit      Cardiovascular and Mediastinum   Hypertension associated with diabetes ([place])    Chronic, stable with BP at goal today and on home readings.  Continue current medication regimen and collaboration with cardiology.  Recommend checking BP at home at least a few mornings a week + focus on DASH diet.  BMP today.  Return in 3 months.      Relevant Orders   Basic metabolic panel   Bayer DCA Hb W3U Waived   Systolic CHF (HCC)    Chronic, stable.  Continue current medication regiment and collaboration with cardiology.  BMP today.   Recommend: - Reminded to call for an overnight weight gain of >2 pounds or a weekly weight weight of >5 pounds - not adding salt to his food and has been reading food labels. Reviewed the importance of keeping daily sodium intake to 2000mg  daily       Type 2  diabetes mellitus with cardiac complication (HCC) - Primary    Chronic, ongoing with HF.  A1C today 6.9%, downward trend -- praised for this.  Continue current medication regimen and adjust as needed + continue collaboration with cardiology.  Recommend he check BS at home at least a few mornings a week.  He is to focus on diet heavily over next three months to reach A1C goal <7.  Continue to collaborate with CCM crew.  May increase Invokana to 300 MG next visit if elevated above goal OR add on GLP1.  At this time he wishes to continue current regimen.  Return to office in 3 months.      Relevant Orders  Bayer DCA Hb A1c Waived     Endocrine   Hyperlipidemia associated with type 2 diabetes mellitus (HCC)    Chronic, ongoing.  No current medication due to poor tolerance.   Would benefit from statin use with diabetes and heart health or consider injectable, discussed with him today.  LDL last visit was 82.  Lipid panel today.      Relevant Orders   Bayer DCA Hb A1c Waived   Lipid Panel w/o Chol/HDL Ratio   CKD stage 3 due to type 2 diabetes mellitus (HCC)    Ongoing, 3a.  Recheck BMP today and monitor closely.  Consider referral to nephrology if worsening.        Other   Myalgia due to statin    Due to statin therapy, history of on low doses.  Would benefit from statin use with diabetes and heart health or consider injectable, discussed with him today.  LDL last visit was 82.  Lipid panel today.          Follow up plan: Return in about 3 months (around 05/30/2020) for [place].

## 2020-02-28 NOTE — Assessment & Plan Note (Signed)
Chronic, stable.  Continue current medication regiment and collaboration with cardiology.  BMP today.   Recommend: - Reminded to call for an overnight weight gain of >2 pounds or a weekly weight weight of >5 pounds - not adding salt to his food and has been reading food labels. Reviewed the importance of keeping daily sodium intake to 2000mg  daily

## 2020-02-28 NOTE — Assessment & Plan Note (Signed)
Chronic, stable with BP at goal today and on home readings.  Continue current medication regimen and collaboration with cardiology.  Recommend checking BP at home at least a few mornings a week + focus on DASH diet.  BMP today.  Return in 3 months. 

## 2020-02-28 NOTE — Assessment & Plan Note (Signed)
Due to statin therapy, history of on low doses.  Would benefit from statin use with diabetes and heart health or consider injectable, discussed with him today.  LDL last visit was 82.  Lipid panel today.

## 2020-02-28 NOTE — Assessment & Plan Note (Signed)
Ongoing, 3a.  Recheck BMP today and monitor closely.  Consider referral to nephrology if worsening.

## 2020-02-28 NOTE — Assessment & Plan Note (Signed)
Chronic, ongoing with HF.  A1C today 6.9%, downward trend -- praised for this.  Continue current medication regimen and adjust as needed + continue collaboration with cardiology.  Recommend he check BS at home at least a few mornings a week.  He is to focus on diet heavily over next three months to reach A1C goal <7.  Continue to collaborate with CCM crew.  May increase Invokana to 300 MG next visit if elevated above goal OR add on GLP1.  At this time he wishes to continue current regimen.  Return to office in 3 months.

## 2020-02-28 NOTE — Assessment & Plan Note (Addendum)
Chronic, ongoing.  No current medication due to poor tolerance.   Would benefit from statin use with diabetes and heart health or consider injectable, discussed with him today.  LDL last visit was 82.  Lipid panel today.

## 2020-02-28 NOTE — Patient Instructions (Signed)
DASH Eating Plan DASH stands for "Dietary Approaches to Stop Hypertension." The DASH eating plan is a healthy eating plan that has been shown to reduce high blood pressure (hypertension). It may also reduce your risk for type 2 diabetes, heart disease, and stroke. The DASH eating plan may also help with weight loss. What are tips for following this plan?  General guidelines  Avoid eating more than 2,300 mg (milligrams) of salt (sodium) a day. If you have hypertension, you may need to reduce your sodium intake to 1,500 mg a day.  Limit alcohol intake to no more than 1 drink a day for nonpregnant women and 2 drinks a day for men. One drink equals 12 oz of beer, 5 oz of wine, or 1 oz of hard liquor.  Work with your health care provider to maintain a healthy body weight or to lose weight. Ask what an ideal weight is for you.  Get at least 30 minutes of exercise that causes your heart to beat faster (aerobic exercise) most days of the week. Activities may include walking, swimming, or biking.  Work with your health care provider or diet and nutrition specialist (dietitian) to adjust your eating plan to your individual calorie needs. Reading food labels   Check food labels for the amount of sodium per serving. Choose foods with less than 5 percent of the Daily Value of sodium. Generally, foods with less than 300 mg of sodium per serving fit into this eating plan.  To find whole grains, look for the word "whole" as the first word in the ingredient list. Shopping  Buy products labeled as "low-sodium" or "no salt added."  Buy fresh foods. Avoid canned foods and premade or frozen meals. Cooking  Avoid adding salt when cooking. Use salt-free seasonings or herbs instead of table salt or sea salt. Check with your health care provider or pharmacist before using salt substitutes.  Do not fry foods. Cook foods using healthy methods such as baking, boiling, grilling, and broiling instead.  Cook with  heart-healthy oils, such as olive, canola, soybean, or sunflower oil. Meal planning  Eat a balanced diet that includes: ? 5 or more servings of fruits and vegetables each day. At each meal, try to fill half of your plate with fruits and vegetables. ? Up to 6-8 servings of whole grains each day. ? Less than 6 oz of lean meat, poultry, or fish each day. A 3-oz serving of meat is about the same size as a deck of cards. One egg equals 1 oz. ? 2 servings of low-fat dairy each day. ? A serving of nuts, seeds, or beans 5 times each week. ? Heart-healthy fats. Healthy fats called Omega-3 fatty acids are found in foods such as flaxseeds and coldwater fish, like sardines, salmon, and mackerel.  Limit how much you eat of the following: ? Canned or prepackaged foods. ? Food that is high in trans fat, such as fried foods. ? Food that is high in saturated fat, such as fatty meat. ? Sweets, desserts, sugary drinks, and other foods with added sugar. ? Full-fat dairy products.  Do not salt foods before eating.  Try to eat at least 2 vegetarian meals each week.  Eat more home-cooked food and less restaurant, buffet, and fast food.  When eating at a restaurant, ask that your food be prepared with less salt or no salt, if possible. What foods are recommended? The items listed may not be a complete list. Talk with your dietitian about   what dietary choices are best for you. Grains Whole-grain or whole-wheat bread. Whole-grain or whole-wheat pasta. Brown rice. Oatmeal. Quinoa. Bulgur. Whole-grain and low-sodium cereals. Pita bread. Low-fat, low-sodium crackers. Whole-wheat flour tortillas. Vegetables Fresh or frozen vegetables (raw, steamed, roasted, or grilled). Low-sodium or reduced-sodium tomato and vegetable juice. Low-sodium or reduced-sodium tomato sauce and tomato paste. Low-sodium or reduced-sodium canned vegetables. Fruits All fresh, dried, or frozen fruit. Canned fruit in natural juice (without  added sugar). Meat and other protein foods Skinless chicken or turkey. Ground chicken or turkey. Pork with fat trimmed off. Fish and seafood. Egg whites. Dried beans, peas, or lentils. Unsalted nuts, nut butters, and seeds. Unsalted canned beans. Lean cuts of beef with fat trimmed off. Low-sodium, lean deli meat. Dairy Low-fat (1%) or fat-free (skim) milk. Fat-free, low-fat, or reduced-fat cheeses. Nonfat, low-sodium ricotta or cottage cheese. Low-fat or nonfat yogurt. Low-fat, low-sodium cheese. Fats and oils Soft margarine without trans fats. Vegetable oil. Low-fat, reduced-fat, or light mayonnaise and salad dressings (reduced-sodium). Canola, safflower, olive, soybean, and sunflower oils. Avocado. Seasoning and other foods Herbs. Spices. Seasoning mixes without salt. Unsalted popcorn and pretzels. Fat-free sweets. What foods are not recommended? The items listed may not be a complete list. Talk with your dietitian about what dietary choices are best for you. Grains Baked goods made with fat, such as croissants, muffins, or some breads. Dry pasta or rice meal packs. Vegetables Creamed or fried vegetables. Vegetables in a cheese sauce. Regular canned vegetables (not low-sodium or reduced-sodium). Regular canned tomato sauce and paste (not low-sodium or reduced-sodium). Regular tomato and vegetable juice (not low-sodium or reduced-sodium). Pickles. Olives. Fruits Canned fruit in a light or heavy syrup. Fried fruit. Fruit in cream or butter sauce. Meat and other protein foods Fatty cuts of meat. Ribs. Fried meat. Bacon. Sausage. Bologna and other processed lunch meats. Salami. Fatback. Hotdogs. Bratwurst. Salted nuts and seeds. Canned beans with added salt. Canned or smoked fish. Whole eggs or egg yolks. Chicken or turkey with skin. Dairy Whole or 2% milk, cream, and half-and-half. Whole or full-fat cream cheese. Whole-fat or sweetened yogurt. Full-fat cheese. Nondairy creamers. Whipped toppings.  Processed cheese and cheese spreads. Fats and oils Butter. Stick margarine. Lard. Shortening. Ghee. Bacon fat. Tropical oils, such as coconut, palm kernel, or palm oil. Seasoning and other foods Salted popcorn and pretzels. Onion salt, garlic salt, seasoned salt, table salt, and sea salt. Worcestershire sauce. Tartar sauce. Barbecue sauce. Teriyaki sauce. Soy sauce, including reduced-sodium. Steak sauce. Canned and packaged gravies. Fish sauce. Oyster sauce. Cocktail sauce. Horseradish that you find on the shelf. Ketchup. Mustard. Meat flavorings and tenderizers. Bouillon cubes. Hot sauce and Tabasco sauce. Premade or packaged marinades. Premade or packaged taco seasonings. Relishes. Regular salad dressings. Where to find more information:  National Heart, Lung, and Blood Institute: www.nhlbi.nih.gov  American Heart Association: www.heart.org Summary  The DASH eating plan is a healthy eating plan that has been shown to reduce high blood pressure (hypertension). It may also reduce your risk for type 2 diabetes, heart disease, and stroke.  With the DASH eating plan, you should limit salt (sodium) intake to 2,300 mg a day. If you have hypertension, you may need to reduce your sodium intake to 1,500 mg a day.  When on the DASH eating plan, aim to eat more fresh fruits and vegetables, whole grains, lean proteins, low-fat dairy, and heart-healthy fats.  Work with your health care provider or diet and nutrition specialist (dietitian) to adjust your eating plan to your   individual calorie needs. This information is not intended to replace advice given to you by your health care provider. Make sure you discuss any questions you have with your health care provider. Document Revised: 04/08/2017 Document Reviewed: 04/19/2016 Elsevier Patient Education  2020 Elsevier Inc.  

## 2020-02-29 ENCOUNTER — Encounter: Payer: Self-pay | Admitting: Nurse Practitioner

## 2020-02-29 LAB — BASIC METABOLIC PANEL
BUN/Creatinine Ratio: 14 (ref 10–24)
BUN: 19 mg/dL (ref 8–27)
CO2: 22 mmol/L (ref 20–29)
Calcium: 9.4 mg/dL (ref 8.6–10.2)
Chloride: 104 mmol/L (ref 96–106)
Creatinine, Ser: 1.35 mg/dL — ABNORMAL HIGH (ref 0.76–1.27)
GFR calc Af Amer: 61 mL/min/{1.73_m2} (ref >59)
GFR calc non Af Amer: 53 mL/min/{1.73_m2} — ABNORMAL LOW (ref >59)
Glucose: 210 mg/dL — ABNORMAL HIGH (ref 65–99)
Potassium: 4.3 mmol/L (ref 3.5–5.2)
Sodium: 140 mmol/L (ref 134–144)

## 2020-02-29 LAB — LIPID PANEL W/O CHOL/HDL RATIO
Cholesterol, Total: 150 mg/dL (ref 100–199)
HDL: 33 mg/dL — ABNORMAL LOW (ref >39)
LDL Chol Calc (NIH): 86 mg/dL (ref 0–99)
Triglycerides: 180 mg/dL — ABNORMAL HIGH (ref 0–149)
VLDL Cholesterol Cal: 31 mg/dL (ref 5–40)

## 2020-02-29 NOTE — Progress Notes (Signed)
Contacted via MyChart  Good evening Ryan [name], your labs have returned.  Kidney function continues to show stable kidney disease with no decline.  Cholesterol levels continue to show a good LDL at 86 -- this has creeped up a little.  I would like to see if back in 70's.  Hope you have safe travels and a wonderful time.  If any questions let me know.   Keep being awesome!!  Thank you for allowing me to participate in your care. Kindest regards, Nesta Kimple

## 2020-03-03 ENCOUNTER — Other Ambulatory Visit: Payer: Self-pay | Admitting: Nurse Practitioner

## 2020-03-03 NOTE — Telephone Encounter (Signed)
Requested medication (s) are due for refill today: Unclear  Requested medication (s) are on the active medication list: Yes  Last refill:  02/12/20  Future visit scheduled: Yes  Notes to clinic:  Historical provider.    Requested Prescriptions  Pending Prescriptions Disp Refills   spironolactone (ALDACTONE) 25 MG tablet [Pharmacy Med Name: SPIRONOLACTONE 25 MG TABLET] 30 tablet 0    Sig: Take 1 tablet (25 mg total) by mouth daily.      Cardiovascular: Diuretics - Aldosterone Antagonist Failed - 03/03/2020 12:50 PM      Failed - Cr in normal range and within 360 days    Creatinine, Ser  Date Value Ref Range Status  02/28/2020 1.35 (H) 0.76 - 1.27 mg/dL Final          Passed - K in normal range and within 360 days    Potassium  Date Value Ref Range Status  02/28/2020 4.3 3.5 - 5.2 mmol/L Final          Passed - Na in normal range and within 360 days    Sodium  Date Value Ref Range Status  02/28/2020 140 134 - 144 mmol/L Final          Passed - Last BP in normal range    BP Readings from Last 1 Encounters:  02/28/20 132/84          Passed - Valid encounter within last 6 months    Recent Outpatient Visits           4 days ago Type 2 diabetes mellitus with cardiac complication ([place])   [place] Cannady, [name] T, NP   3 months ago Type 2 diabetes mellitus with cardiac complication ([place])   [place], [name] T, NP   6 months ago Type 2 diabetes mellitus with cardiac complication ([place])   [place] Cannady, Barbaraann Faster, NP   9 months ago Annual physical exam   [place] [facility], Henrine Screws T, NP   1 year ago Hypothyroidism, unspecified type   [facility] [place], Jeannette How, MD       Future Appointments             In 1 month Ralene Bathe, MD [place]   In 3 months Cannady, Barbaraann Faster, NP [employer]

## 2020-03-21 ENCOUNTER — Telehealth: Payer: PRIVATE HEALTH INSURANCE

## 2020-03-21 NOTE — Chronic Care Management (AMB) (Deleted)
Chronic Care Management Pharmacy  Name: FRITZ CAUTHON  [name]: [id number] DOB: [birth year]   Chief Complaint/ HPI  Margorie John,  69 y.o. , male presents for their Follow-Up CCM visit with the clinical pharmacist via telephone.  PCP : Venita Lick, NP Patient Care Team: Venita Lick, NP as PCP - General (Nurse Practitioner) Carloyn Manner, MD as Referring Physician (Otolaryngology) Deboraha Sprang, MD as Consulting Physician (Cardiology) Vladimir Faster, [facility] (Pharmacist)  Their chronic conditions include: Hypertension, Hyperlipidemia, Diabetes, Heart Failure, Chronic Kidney Disease, Gout and ICD myalgia due to statin ? hypothyroidism  Office Visits: 02/28/20- [name], NP - blood work, gfr 72  Consult Visit: 02/12/20 0 dr. Caryl Comes, cardiology- EKG, spironolactone decreased dose  Allergies  Allergen Reactions  . Ace Inhibitors     Muscle aches   . Crestor [Rosuvastatin Calcium] Other (See Comments)    Muscle aches    Medications: Outpatient Encounter Medications as of 03/21/2020  Medication Sig  . canagliflozin (INVOKANA) 100 MG TABS tablet Take 1 tablet (100 mg total) by mouth daily before breakfast.  . carvedilol (COREG) 12.5 MG tablet Take 1 tablet (12.5 mg total) by mouth 2 (two) times daily.  . Cholecalciferol (VITAMIN D3 SUPER STRENGTH) 50 MCG (2000 UT) CAPS Take 2,000 Units by mouth daily.  Marland Kitchen dexlansoprazole (DEXILANT) 60 MG capsule Take 1 capsule (60 mg total) by mouth daily.  Marland Kitchen ENTRESTO 24-26 MG Take 1 tablet by mouth 2 (two) times daily.  . febuxostat (ULORIC) 40 MG tablet Take 1 tablet (40 mg total) by mouth daily.  Marland Kitchen levothyroxine (SYNTHROID) 50 MCG tablet Take 1 tablet (50 mcg total) by mouth daily.  Marland Kitchen spironolactone (ALDACTONE) 25 MG tablet Take 1 tablet (25 mg total) by mouth daily.   No facility-administered encounter medications on file as of 03/21/2020.    Wt Readings from Last 3 Encounters:  02/28/20 196 lb (88.9 kg)  02/12/20 198 lb  (89.8 kg)  11/28/19 199 lb (90.3 kg)    Current Diagnosis/Assessment:    Goals Addressed   None     Diabetes   A1c goal {A1c goals:23924}  Recent Relevant Labs: Lab Results  Component Value Date/Time   HGBA1C 6.9 02/28/2020 09:13 AM   HGBA1C 7.2 (H) 11/28/2019 10:51 AM   HGBA1C 6.9 11/08/2017 12:00 AM   HGBA1C 6.9 11/08/2017 12:00 AM   MICROALBUR 10 05/29/2019 09:16 AM   MICROALBUR 10 02/09/2016 10:50 AM    Last diabetic Eye exam:  Lab Results  Component Value Date/Time   HMDIABEYEEXA No Retinopathy 02/05/2019 12:00 AM    Last diabetic Foot exam: No results found for: HMDIABFOOTEX   Checking BG: {CHL HP Blood Glucose Monitoring Frequency:6026240638}  Recent FBG Readings: *** Recent pre-meal BG readings: *** Recent 2hr PP BG readings:  *** Recent HS BG readings: ***  Patient has failed these meds in past: *** Patient is currently {CHL Controlled/Uncontrolled:[phone]} on the following medications: . Canagliflozin 100 mg daily  We discussed: Will complete renewal for PAP.  Plan  Continue {CHL HP Upstream Pharmacy [phone]}  Hypertension/ CKD III/Heart failure/Caridomyopathy/ Bivent ICD   BP goal is:  {CHL HP UPSTREAM Pharmacist BP ranges:2391936827}  Office blood pressures are  BP Readings from Last 3 Encounters:  02/28/20 132/84  02/12/20 130/84  11/28/19 130/76   BMP Latest Ref Rng & Units 02/28/2020 11/28/2019 08/29/2019  Glucose 65 - 99 mg/dL 210(H) 209(H) 184(H)  BUN 8 - 27 mg/dL '19 16 22  ' Creatinine 0.76 - 1.27 mg/dL  1.35(H) 1.37(H) 1.42(H)  BUN/Creat Ratio 10 - '24 14 12 15  ' Sodium 134 - 144 mmol/L 140 140 138  Potassium 3.5 - 5.2 mmol/L 4.3 4.6 4.6  Chloride 96 - 106 mmol/L 104 102 102  CO2 20 - 29 mmol/L '22 24 23  ' Calcium 8.6 - 10.2 mg/dL 9.4 9.8 9.8    Patient checks BP at home {CHL HP BP Monitoring Frequency:[phone]} Patient home BP readings are ranging: ***  Patient has failed these meds in the past: *** Patient is  currently {CHL Controlled/[phone]} on the following medications:  . Entresto 24-26 mg bid  . Carvedilol 12.5 mg bid . Spironolactone 25 mg qd  We discussed Will complete renewal for PAP  Plan  Continue {CHL HP Upstream Pharmacy Plans:[phone]}     Hyperlipidemia/ Myalgia due to statin   LDL goal < ***  Last lipids Lab Results  Component Value Date   CHOL 150 02/28/2020   HDL 33 (L) 02/28/2020   LDLCALC 86 02/28/2020   TRIG 180 (H) 02/28/2020   CHOLHDL 4.0 05/24/2018   Hepatic Function Latest Ref Rng & Units 05/29/2019 01/10/2019 05/24/2018  Total Protein 6.0 - 8.5 g/dL 7.2 - 7.4  Albumin 3.8 - 4.8 g/dL 4.8 - 5.1(H)  AST 0 - 40 IU/L '19 25 15  ' ALT 0 - 44 IU/L 24 35 31  Alk Phosphatase 39 - 117 IU/L 49 - 48  Total Bilirubin 0.0 - 1.2 mg/dL 0.9 - 0.6     The 10-year ASCVD risk score Mikey Bussing DC Jr., et al., 2013) is: 37%   Values used to calculate the score:     Age: 59 years     Sex: Male     Is Non-Hispanic African American: No     Diabetic: Yes     Tobacco smoker: No     Systolic Blood Pressure: 932 mmHg     Is BP treated: Yes     HDL Cholesterol: 33 mg/dL     Total Cholesterol: 150 mg/dL   Patient has failed these meds in past: pravastatin/rosuvastatin Patient is currently {CHL Controlled/[phone]} on the following medications:  . ***  We discussed:  {CHL HP Upstream Pharmacy discussion:[phone]}  Plan  Continue {CHL HP Upstream Pharmacy Plans:[phone]}     Medication Management   Pt uses *** pharmacy for all medications Uses pill box? {Yes or If no, why not?:20788} Pt endorses ***% compliance  We discussed: {Pharmacy options:24294}  Plan  {US Pharmacy TFTD:32202}    Follow up: *** month phone visit  ***

## 2020-03-24 ENCOUNTER — Telehealth: Payer: Self-pay | Admitting: Pharmacist

## 2020-03-24 NOTE — Chronic Care Management (AMB) (Signed)
Chronic Care Management Pharmacy Assistant   Name: Ryan [name]  [name]: [id number] DOB: July 22, 1950  Reason for Encounter: DM Adherence Call  Patient Questions:  1.  Have you seen any other providers since your last visit? No  2.  Any changes in your medicines or health? No    PCP : Venita Lick, NP  Allergies:   Allergies  Allergen Reactions  . Ace Inhibitors     Muscle aches   . Crestor [Rosuvastatin Calcium] Other (See Comments)    Muscle aches    Medications: Outpatient Encounter Medications as of 03/24/2020  Medication Sig  . canagliflozin (INVOKANA) 100 MG TABS tablet Take 1 tablet (100 mg total) by mouth daily before breakfast.  . carvedilol (COREG) 12.5 MG tablet Take 1 tablet (12.5 mg total) by mouth 2 (two) times daily.  . Cholecalciferol (VITAMIN D3 SUPER STRENGTH) 50 MCG (2000 UT) CAPS Take 2,000 Units by mouth daily.  Marland Kitchen dexlansoprazole (DEXILANT) 60 MG capsule Take 1 capsule (60 mg total) by mouth daily.  Marland Kitchen ENTRESTO 24-26 MG Take 1 tablet by mouth 2 (two) times daily.  . febuxostat (ULORIC) 40 MG tablet Take 1 tablet (40 mg total) by mouth daily.  Marland Kitchen levothyroxine (SYNTHROID) 50 MCG tablet Take 1 tablet (50 mcg total) by mouth daily.  Marland Kitchen spironolactone (ALDACTONE) 25 MG tablet Take 1 tablet (25 mg total) by mouth daily.   No facility-administered encounter medications on file as of 03/24/2020.    Current Diagnosis: Patient Active Problem List   Diagnosis Date Noted  . CKD stage 3 due to type 2 diabetes mellitus ([place]) 08/30/2019  . Type 2 diabetes mellitus with cardiac complication ([place]) 68/34/1962  . Hypothyroidism 05/26/2019  . Advanced care planning/counseling discussion 04/27/2017  . Systolic CHF ([place]) 22/97/9892  . BPH (benign prostatic hyperplasia) 04/14/2016  . Hypertension associated with diabetes ([place]) 12/16/2014  . Gout 12/16/2014  . Hyperlipidemia associated with type 2 diabetes mellitus ([place]) 06/13/2012  . Biventricular implantable  cardioverter-defibrillator in situ 09/28/2010  . Idiopathic cardiomyopathy ([place]) 01/27/2009  . Myalgia due to statin 01/27/2009   Recent Relevant Labs: Lab Results  Component Value Date/Time   HGBA1C 6.9 02/28/2020 09:13 AM   HGBA1C 7.2 (H) 11/28/2019 10:51 AM   HGBA1C 6.9 11/08/2017 12:00 AM   HGBA1C 6.9 11/08/2017 12:00 AM   MICROALBUR 10 05/29/2019 09:16 AM   MICROALBUR 10 02/09/2016 10:50 AM    Kidney Function Lab Results  Component Value Date/Time   CREATININE 1.35 (H) 02/28/2020 09:17 AM   CREATININE 1.37 (H) 11/28/2019 10:56 AM   GFRNONAA 53 (L) 02/28/2020 09:17 AM   GFRAA 61 02/28/2020 09:17 AM    . Current antihyperglycemic regimen:  o Invokana 100mg -Take 1 tablet by mouth daily before breakfast.  . What recent interventions/DTPs have been made to improve glycemic control:  o 02/28/20-[facility]-[name] Cannady, NP- Continue current medication regimen and adjust as needed. Recommend checking blood sugars at home at least a few mornings a week. Focus on a diet heavily over next three months to reach A1C goal <7. May increase Invokana to 300MG  next visit if elevated above goal or add on GLP-1.   . Have there been any recent hospitalizations or ED visits since last visit with CPP? No   . Patient denies hypoglycemic symptoms, including Pale, Sweaty, Shaky, Hungry, Nervous/irritable and Vision changes   . Patient reports hyperglycemic symptoms, including excessive thirst, fatigue and polyuria   How often are you checking your blood sugar? Patient states he  quit checking but when he was checking it less than a month ago; it would run around 220.  Marland Kitchen What are your blood sugars ranging?  o Fasting: N/A o Before meals: N/A o After meals: N/A o Bedtime: N/A  . During the week, how often does your blood glucose drop below 70? Never   . Are you checking your feet daily/regularly? Patient does check his feet and denies any issues with both feet   Adherence Review: Is the patient  currently on a STATIN medication? No Is the patient currently on ACE/ARB medication? No Does the patient have >5 day gap between last estimated fill dates? No    Goals Addressed   None    03/25/20- The patient returned phone call to CPA this morning however, he was unable to coordinate diabetes assessment over the phone at the moment. The patient stated he will give CPA a call tomorrow at 10:30 a.m. CPA communicated with the patient that would be fine. The patient verbalized understanding.  CPA was able to speak with the patient today and discuss his blood sugar monitoring. The patient communicated with CPA he changed his diet and is eating lots of chicken with green vegetables. The patient expressed he does not like fruit; instead, he eats a different mixture of nuts, maybe a slice of apple, cheese eggs in the morning, ham sandwiches on white whole wheat bread, and cheese crackers. The patient stated he is very active; he works on his tractor, cares for his animals, and mows the yard. The patient confirmed he does experience fatigue, frequent urination, and excessive thirst; the patient feels it comes from working all day. The patient also confirmed he does take current antihyperglycemic regimen of Invokana but takes it at a different instead of before breakfast. Patient states he does not monitor blood glucose daily but when he did less than a month ago it was running around 220. Patient states he does check his feet on a daily basis and denies any issues present. CPA did encourage the patient to continue eating well, stay active when possible, and try to keep a track on his blood sugars. Patient verbalized understanding.  CPA asked if the patient had any other issues or concerns regarding his blood glucose and the patient kindly responded he is doing well.     03/24/20- Unsuccessful outreach to the patient to speak about his blood sugar monitoring. CPA left a HIPAA compliant voicemail.  Raynelle Highland, [place] Assistant [phone]   Follow-Up:  Pharmacist Review

## 2020-03-31 ENCOUNTER — Telehealth: Payer: Self-pay | Admitting: Pharmacist

## 2020-03-31 NOTE — Chronic Care Management (AMB) (Addendum)
Chronic Care Management Pharmacy Assistant   Name: Ryan [name]  [name]: [id number] DOB: 01/23/1951  Reason for Encounter: Assistance for Entresto Refill.    PCP : Venita Lick, NP  Allergies:   Allergies  Allergen Reactions   Ace Inhibitors     Muscle aches    Crestor [Rosuvastatin Calcium] Other (See Comments)    Muscle aches    Medications: Outpatient Encounter Medications as of 03/31/2020  Medication Sig   canagliflozin (INVOKANA) 100 MG TABS tablet Take 1 tablet (100 mg total) by mouth daily before breakfast.   carvedilol (COREG) 12.5 MG tablet Take 1 tablet (12.5 mg total) by mouth 2 (two) times daily.   Cholecalciferol (VITAMIN D3 SUPER STRENGTH) 50 MCG (2000 UT) CAPS Take 2,000 Units by mouth daily.   dexlansoprazole (DEXILANT) 60 MG capsule Take 1 capsule (60 mg total) by mouth daily.   ENTRESTO 24-26 MG Take 1 tablet by mouth 2 (two) times daily.   febuxostat (ULORIC) 40 MG tablet Take 1 tablet (40 mg total) by mouth daily.   levothyroxine (SYNTHROID) 50 MCG tablet Take 1 tablet (50 mcg total) by mouth daily.   spironolactone (ALDACTONE) 25 MG tablet Take 1 tablet (25 mg total) by mouth daily.   No facility-administered encounter medications on file as of 03/31/2020.    Current Diagnosis: Patient Active Problem List   Diagnosis Date Noted   CKD stage 3 due to type 2 diabetes mellitus ([place]) 08/30/2019   Type 2 diabetes mellitus with cardiac complication ([place]) 40/98/1191   Hypothyroidism 05/26/2019   Advanced care planning/counseling discussion 47/82/9562   Systolic CHF ([place]) 13/12/6576   BPH (benign prostatic hyperplasia) 04/14/2016   Hypertension associated with diabetes ([place]) 12/16/2014   Gout 12/16/2014   Hyperlipidemia associated with type 2 diabetes mellitus ([place]) 06/13/2012   Biventricular implantable cardioverter-defibrillator in situ 09/28/2010   Idiopathic cardiomyopathy ([place]) 01/27/2009   Myalgia due to statin 01/27/2009    Goals Addressed    None    03/31/20: The patient called explaining his prescription request was not "working" and needed a refill of his Entresto. The patient explained he has had patient assistance for this medication since March 2021. Stated, " I just call the automated phone system give them my prescription number which is [phone] and the medication is sent to my home." "Now the automated system is saying my prescription number is wrong, but I'm more concerned because I only have 4 days left of this medication." Reassured patient we would resolve this matter. I confirmed the prescription number twice with the patient, who confirmed it was correct.  [employer] and explained the situation to the call center representative. [employer] explained the prescription number that the patient was supplied was incorrect. The correct prescription number is [id number]. I read back the given number twice to the representative who confirmed it was correct. I also requested if his refill could be sent overnight due to the low supply of the patient's medications. [employer] explained they would overnight the patient's medication and the patient should receive his refill no later than Wednesday 04-02-20.  Called the patient back with the information above. Gave him the new prescription number and he was able to confirm the refill number correctly back to me.  Made Birdena Crandall, [facility] aware of situation and resolution who verbalized understanding of all information given.    Cloretta Ned, LPN Clinical Pharmacist Assistant  [phone]   Follow-Up:  Instructed patient to call for any more needs, concerns or  questions.  I have reviewed the care management and care coordination activities outlined in this encounter and I am certifying that I agree with the content of this note.  Junita Push. Kenton Kingfisher PharmD, [facility] [phone]

## 2020-04-15 ENCOUNTER — Telehealth: Payer: Medicare Other | Admitting: General Practice

## 2020-04-15 ENCOUNTER — Ambulatory Visit: Payer: Self-pay | Admitting: General Practice

## 2020-04-15 ENCOUNTER — Telehealth: Payer: Self-pay | Admitting: General Practice

## 2020-04-15 DIAGNOSIS — E1169 Type 2 diabetes mellitus with other specified complication: Secondary | ICD-10-CM

## 2020-04-15 DIAGNOSIS — M791 Myalgia, unspecified site: Secondary | ICD-10-CM

## 2020-04-15 DIAGNOSIS — E1159 Type 2 diabetes mellitus with other circulatory complications: Secondary | ICD-10-CM

## 2020-04-15 DIAGNOSIS — I152 Hypertension secondary to endocrine disorders: Secondary | ICD-10-CM

## 2020-04-15 DIAGNOSIS — I5020 Unspecified systolic (congestive) heart failure: Secondary | ICD-10-CM

## 2020-04-15 NOTE — Telephone Encounter (Signed)
  Chronic Care Management   Note  04/15/2020 Name: Ryan [name] [name]: [id number] DOB: 02-10-1951  The patient called back and the RNCM was able to complete the call. See new encounter.  Follow up plan: Face to Face appointment with care management team member scheduled for:  06-04-2020 at 10 am  Blue Ball, MSN, [facility] Mobile: [phone]

## 2020-04-15 NOTE — Patient Instructions (Signed)
Visit Information  Patient Care Plan: RNCM: Heart Failure (Adult)    Problem Identified: Symptom Exacerbation (Heart Failure)     Goal: RNCM: HF Symptom Exacerbation Prevented or Minimized   Start Date: 04/15/2020  Expected End Date: 07/14/2020  Priority: Medium  Note:   Current Barriers:  Marland Kitchen Knowledge deficits related to basic heart failure pathophysiology and self care management . Does not adhere to provider recommendations re: weighing daily . Lack of scale in home- has scales but does not weigh daily . Financial strain . Very active lifestyle, patient states- "I am busy all of the time" Nurse Case Manager Clinical Goal(s):   Over the next 90 days, patient will weigh self daily and record- ask patient to be mindful of weight shifts and call for changes in weight  Over the next 90 days, patient will verbalize understanding of Heart Failure Action Plan and when to call doctor  Over the next 90 days, patient will take all Heart Failure mediations as prescribed Interventions:  . Basic overview and discussion of pathophysiology of Heart Failure . Provided written and verbal education on low sodium diet . Reviewed Heart Failure Action Plan in depth and provided written copy . Assessed for scales in home- has scales- last weight was 195 after Thanksgiving . Discussed importance of daily weight- review with the patient. The patient verbalized he does not weigh daily but does not have issues with fluid shifts. Will monitor for changes. Encouraged the patient to call for weight gain of 3 pounds in one day or 5 pounds in one week.  . Reviewed role of diuretics in prevention of fluid overload . - barriers to lifestyle changes reviewed and addressed . - barriers to treatment reviewed and addressed . - cognitive screening completed and reviewed . - health literacy screening completed or reviewed . - medication-adherence assessment completed . - self-awareness of signs/symptoms of worsening  disease encouraged  Patient Goals/Self-Care Activities . Over the next 90 days, patient will:  - Take Heart Failure Medications as prescribed - Weigh daily and record (notify MD with 3 lb weight gain over night or 5 lb in a week)The patient states he does not weigh daily but he is mindful of any weight gain or changes  - Follow CHF Action Plan - Adhere to low sodium diet Follow Up Plan: Face to Face appointment with care management team member scheduled for:  06-04-2020 at 10 am    Task: RNCM: Identify and Minimize Risk of Heart Failure Exacerbation   Note:   Care Management Activities:    - barriers to lifestyle changes reviewed and addressed - barriers to treatment reviewed and addressed - cognitive screening completed and reviewed - health literacy screening completed or reviewed - medication-adherence assessment completed - self-awareness of signs/symptoms of worsening disease encouraged      Patient Care Plan: RNCM: Diabetes Type 2 (Adult)    Problem Identified: Glycemic Management (Diabetes, Type 2)     Long-Range Goal: RNCM: DM care plan for patient with last hemoglobin A1C of 6.9 on 02-28-2020   Start Date: 04/15/2020  Expected End Date: 08/13/2020  Priority: High  Note:   Objective:  Lab Results  Component Value Date   HGBA1C 6.9 02/28/2020 .   Lab Results  Component Value Date   CREATININE 1.35 (H) 02/28/2020   CREATININE 1.37 (H) 11/28/2019   CREATININE 1.42 (H) 08/29/2019 .   Marland Kitchen No results found for: EGFR Current Barriers:  Marland Kitchen Knowledge Deficits related to basic Diabetes pathophysiology and  self care/management . Knowledge Deficits related to medications used for management of diabetes . Difficulty obtaining or cannot afford medications . Does not use cbg meter  . Unable to independently manage DM as evidence of hemoglobin A1C of 6.9% Case Manager Clinical Goal(s):  Marland Kitchen Over the next 120 days, patient will demonstrate improved adherence to prescribed treatment  plan for diabetes self care/management as evidenced by: monitoring blood sugar per provider recommendations, adhering to a heart healthy/ADA diet and maintaining hemoglobin A1C of 7.0 or less . daily monitoring and recording of CBG- the patient does not check blood sugars daily. Education and support. Will collaborate with pcp to see what expectations of checking blood sugars are . adherence to ADA/ carb modified diet . exercise 4/5 days/week . adherence to prescribed medication regimen Interventions:  . Provided education to patient about basic DM disease process . Reviewed medications with patient and discussed importance of medication adherence . Discussed plans with patient for ongoing care management follow up and provided patient with direct contact information for care management team . Provided patient with written educational materials related to hypo and hyperglycemia and importance of correct treatment. Discussed the importance of recognition of hypo and hyperglycemia and what sx and sx to look for. The patient does not check blood sugars. He denies any episodes of hypo and hyperglycemia.  Will continue to monitor and educate accordingly.  . Reviewed scheduled/upcoming provider appointments including: 06-04-2020 at 10 am- will plan to see the patient face to face this day in the office for his pcp visit . Review of patient status, including review of consultants reports, relevant laboratory and other test results, and medications completed. . - barriers to adherence to treatment plan identified . - blood glucose monitoring encouraged . - mutual A1C goal set or reviewed . - resources required to improve adherence to care identified . - self-awareness of signs/symptoms of hypo or hyperglycemia encouraged Patient Goals/Self-Care Activities . Over the next 120 days, patient will:  - UNABLE to independently manage DM as evidence of hemoglobin A1C elevated Checks blood sugars as prescribed  and utilize hyper and hypoglycemia protocol as needed Adheres to prescribed ADA/carb modified Follow Up Plan: Face to Face appointment with care management team member scheduled for: 06-04-2020 at 10 am    Task: RNCM: Alleviate Barriers to Glycemic Management   Note:   Care Management Activities:    - barriers to adherence to treatment plan identified - blood glucose monitoring encouraged - mutual A1C goal set or reviewed - resources required to improve adherence to care identified - self-awareness of signs/symptoms of hypo or hyperglycemia encouraged      Patient Care Plan: RNCM: Hypertension (Adult)    Problem Identified: Hypertension (Hypertension)     Goal: RNCM: Management of Hypertension/HLD   Expected End Date: 08/13/2020  Note:   Current Barriers:  . Chronic Disease Management support and education needs related to HTN and HLD  Nurse Case Manager Clinical Goal(s):  Marland Kitchen Over the next 120 days, patient will verbalize understanding of plan for HTN and HLD . Over the next 120 days, patient will meet with RN Care Manager to address barriers to managing HTN and HLD . Over the next 120 days, patient will attend all scheduled medical appointments: next appointment with pcp 06-04-2020 at 10 am . Over the next 120 days, patient will demonstrate improved adherence to prescribed treatment plan for HTN and HLD as evidenced bynormal range  Interventions:  . Inter-disciplinary care team collaboration (see longitudinal  plan of care) . Evaluation of current treatment plan related to HTN and HLD and patient's adherence to plan as established by provider. . Advised patient to call for changes in blood pressure or sx/sx of worsening conditions . Provided education to patient re: sx and sx of HTN, following a heart healthy diet, adequate exercise . Reviewed medications with patient and discussed compliance . Discussed plans with patient for ongoing care management follow up and provided patient  with direct contact information for care management team . Reviewed scheduled/upcoming provider appointments including: 06-04-2020 at 10 am  Patient Self Care Activities:  . Patient will self administer medications as prescribed . Patient will attend all scheduled provider appointments . Patient will call pharmacy for medication refills . Patient will call provider office for new concerns or questions . Unable to independently manage HTN and HLD as evidence of unable to take statins to manage HLD  Initial goal documentation      Goals Addressed            This Visit's Progress   . RNCM: Obtain Eye Exam-Diabetes Type 2          - schedule appointment with eye doctor    The patient states today that he is going to try and have an eye exam before his next visit with the pcp on 06-04-2020 at 10 am.       The patient verbalized understanding of instructions, educational materials, and care plan provided today and declined offer to receive copy of patient instructions, educational materials, and care plan.   Face to Face appointment with care management team member scheduled for: 06-04-2020 at 10 am, also has a pcp appointment  Noreene Larsson RN, MSN, [facility] Mobile: [phone]

## 2020-04-15 NOTE — Chronic Care Management (AMB) (Signed)
Chronic Care Management   Follow Up Note   04/15/2020 Name: QAADIR KENT [name]: [id number] DOB: Feb 01, 1951  Referred by: Venita Lick, NP Reason for referral : Chronic Care Management (RNCM Follow up for Chronic Disease Management and Care Coordination Needs)   ROGERICK BALDWIN is a 69 y.o. year old male who is a primary care patient of Cannady, Barbaraann Faster, NP. The CCM team was consulted for assistance with chronic disease management and care coordination needs.    Review of patient status, including review of consultants reports, relevant laboratory and other test results, and collaboration with appropriate care team members and the patient's provider was performed as part of comprehensive patient evaluation and provision of chronic care management services.    SDOH (Social Determinants of Health) assessments performed: Yes See Care Plan activities for detailed interventions related to [facility])     Outpatient Encounter Medications as of 04/15/2020  Medication Sig  . canagliflozin (INVOKANA) 100 MG TABS tablet Take 1 tablet (100 mg total) by mouth daily before breakfast.  . carvedilol (COREG) 12.5 MG tablet Take 1 tablet (12.5 mg total) by mouth 2 (two) times daily.  . Cholecalciferol (VITAMIN D3 SUPER STRENGTH) 50 MCG (2000 UT) CAPS Take 2,000 Units by mouth daily.  Marland Kitchen dexlansoprazole (DEXILANT) 60 MG capsule Take 1 capsule (60 mg total) by mouth daily.  Marland Kitchen ENTRESTO 24-26 MG Take 1 tablet by mouth 2 (two) times daily.  . febuxostat (ULORIC) 40 MG tablet Take 1 tablet (40 mg total) by mouth daily.  Marland Kitchen levothyroxine (SYNTHROID) 50 MCG tablet Take 1 tablet (50 mcg total) by mouth daily.  Marland Kitchen spironolactone (ALDACTONE) 25 MG tablet Take 1 tablet (25 mg total) by mouth daily.   No facility-administered encounter medications on file as of 04/15/2020.     Objective:   Goals Addressed            This Visit's Progress   . RNCM: Obtain Eye Exam-Diabetes Type 2          - schedule appointment  with eye doctor    The patient states today that he is going to try and have an eye exam before his next visit with the pcp on 06-04-2020 at 10 am.       Patient Care Plan: RNCM: Heart Failure (Adult)    Problem Identified: Symptom Exacerbation (Heart Failure)     Goal: RNCM: HF Symptom Exacerbation Prevented or Minimized   Start Date: 04/15/2020  Expected End Date: 07/14/2020  Priority: Medium  Note:   Current Barriers:  Marland Kitchen Knowledge deficits related to basic heart failure pathophysiology and self care management . Does not adhere to provider recommendations re: weighing daily . Lack of scale in home- has scales but does not weigh daily . Financial strain . Very active lifestyle, patient states- "I am busy all of the time" Nurse Case Manager Clinical Goal(s):   Over the next 90 days, patient will weigh self daily and record- ask patient to be mindful of weight shifts and call for changes in weight  Over the next 90 days, patient will verbalize understanding of Heart Failure Action Plan and when to call doctor  Over the next 90 days, patient will take all Heart Failure mediations as prescribed Interventions:  . Basic overview and discussion of pathophysiology of Heart Failure . Provided written and verbal education on low sodium diet . Reviewed Heart Failure Action Plan in depth and provided written copy . Assessed for scales in home- has scales- last  weight was 195 after Thanksgiving . Discussed importance of daily weight- review with the patient. The patient verbalized he does not weigh daily but does not have issues with fluid shifts. Will monitor for changes. Encouraged the patient to call for weight gain of 3 pounds in one day or 5 pounds in one week.  . Reviewed role of diuretics in prevention of fluid overload . - barriers to lifestyle changes reviewed and addressed . - barriers to treatment reviewed and addressed . - cognitive screening completed and reviewed . - health  literacy screening completed or reviewed . - medication-adherence assessment completed . - self-awareness of signs/symptoms of worsening disease encouraged  Patient Goals/Self-Care Activities . Over the next 90 days, patient will:  - Take Heart Failure Medications as prescribed - Weigh daily and record (notify MD with 3 lb weight gain over night or 5 lb in a week)The patient states he does not weigh daily but he is mindful of any weight gain or changes  - Follow CHF Action Plan - Adhere to low sodium diet Follow Up Plan: Face to Face appointment with care management team member scheduled for:  06-04-2020 at 10 am    Task: RNCM: Identify and Minimize Risk of Heart Failure Exacerbation   Note:   Care Management Activities:    - barriers to lifestyle changes reviewed and addressed - barriers to treatment reviewed and addressed - cognitive screening completed and reviewed - health literacy screening completed or reviewed - medication-adherence assessment completed - self-awareness of signs/symptoms of worsening disease encouraged      Patient Care Plan: RNCM: Diabetes Type 2 (Adult)    Problem Identified: Glycemic Management (Diabetes, Type 2)     Long-Range Goal: RNCM: DM care plan for patient with last hemoglobin A1C of 6.9 on 02-28-2020   Start Date: 04/15/2020  Expected End Date: 08/13/2020  Priority: High  Note:   Objective:  Lab Results  Component Value Date   HGBA1C 6.9 02/28/2020 .   Lab Results  Component Value Date   CREATININE 1.35 (H) 02/28/2020   CREATININE 1.37 (H) 11/28/2019   CREATININE 1.42 (H) 08/29/2019 .   Marland Kitchen No results found for: EGFR Current Barriers:  Marland Kitchen Knowledge Deficits related to basic Diabetes pathophysiology and self care/management . Knowledge Deficits related to medications used for management of diabetes . Difficulty obtaining or cannot afford medications . Does not use cbg meter  . Unable to independently manage DM as evidence of hemoglobin A1C  of 6.9% Case Manager Clinical Goal(s):  Marland Kitchen Over the next 120 days, patient will demonstrate improved adherence to prescribed treatment plan for diabetes self care/management as evidenced by: monitoring blood sugar per provider recommendations, adhering to a heart healthy/ADA diet and maintaining hemoglobin A1C of 7.0 or less . daily monitoring and recording of CBG- the patient does not check blood sugars daily. Education and support. Will collaborate with pcp to see what expectations of checking blood sugars are . adherence to ADA/ carb modified diet . exercise 4/5 days/week . adherence to prescribed medication regimen Interventions:  . Provided education to patient about basic DM disease process . Reviewed medications with patient and discussed importance of medication adherence . Discussed plans with patient for ongoing care management follow up and provided patient with direct contact information for care management team . Provided patient with written educational materials related to hypo and hyperglycemia and importance of correct treatment. Discussed the importance of recognition of hypo and hyperglycemia and what sx and sx to look  for. The patient does not check blood sugars. He denies any episodes of hypo and hyperglycemia.  Will continue to monitor and educate accordingly.  . Reviewed scheduled/upcoming provider appointments including: 06-04-2020 at 10 am- will plan to see the patient face to face this day in the office for his pcp visit . Review of patient status, including review of consultants reports, relevant laboratory and other test results, and medications completed. . - barriers to adherence to treatment plan identified . - blood glucose monitoring encouraged . - mutual A1C goal set or reviewed . - resources required to improve adherence to care identified . - self-awareness of signs/symptoms of hypo or hyperglycemia encouraged Patient Goals/Self-Care Activities . Over the next  120 days, patient will:  - UNABLE to independently manage DM as evidence of hemoglobin A1C elevated Checks blood sugars as prescribed and utilize hyper and hypoglycemia protocol as needed Adheres to prescribed ADA/carb modified Follow Up Plan: Face to Face appointment with care management team member scheduled for: 06-04-2020 at 10 am    Task: RNCM: Alleviate Barriers to Glycemic Management   Note:   Care Management Activities:    - barriers to adherence to treatment plan identified - blood glucose monitoring encouraged - mutual A1C goal set or reviewed - resources required to improve adherence to care identified - self-awareness of signs/symptoms of hypo or hyperglycemia encouraged      Patient Care Plan: RNCM: Hypertension (Adult)    Problem Identified: Hypertension (Hypertension)     Goal: RNCM: Management of Hypertension/HLD   Expected End Date: 08/13/2020  Note:   Current Barriers:  . Chronic Disease Management support and education needs related to HTN and HLD  Nurse Case Manager Clinical Goal(s):  Marland Kitchen Over the next 120 days, patient will verbalize understanding of plan for HTN and HLD . Over the next 120 days, patient will meet with RN Care Manager to address barriers to managing HTN and HLD . Over the next 120 days, patient will attend all scheduled medical appointments: next appointment with pcp 06-04-2020 at 10 am . Over the next 120 days, patient will demonstrate improved adherence to prescribed treatment plan for HTN and HLD as evidenced bynormal range  Interventions:  . Inter-disciplinary care team collaboration (see longitudinal plan of care) . Evaluation of current treatment plan related to HTN and HLD and patient's adherence to plan as established by provider. . Advised patient to call for changes in blood pressure or sx/sx of worsening conditions . Provided education to patient re: sx and sx of HTN, following a heart healthy diet, adequate exercise . Reviewed  medications with patient and discussed compliance . Discussed plans with patient for ongoing care management follow up and provided patient with direct contact information for care management team . Reviewed scheduled/upcoming provider appointments including: 06-04-2020 at 10 am  Patient Self Care Activities:  . Patient will self administer medications as prescribed . Patient will attend all scheduled provider appointments . Patient will call pharmacy for medication refills . Patient will call provider office for new concerns or questions . Unable to independently manage HTN and HLD as evidence of unable to take statins to manage HLD  Initial goal documentation      Plan:   Face to Face appointment with care management team member scheduled for:  06-04-2020 at 10 am. Patient has an appointment with the pcp.    Noreene Larsson RN, MSN, [facility] Mobile: [phone]

## 2020-04-23 ENCOUNTER — Other Ambulatory Visit: Payer: Self-pay

## 2020-04-23 ENCOUNTER — Ambulatory Visit (INDEPENDENT_AMBULATORY_CARE_PROVIDER_SITE_OTHER): Payer: Medicare Other | Admitting: Dermatology

## 2020-04-23 DIAGNOSIS — L578 Other skin changes due to chronic exposure to nonionizing radiation: Secondary | ICD-10-CM | POA: Diagnosis not present

## 2020-04-23 DIAGNOSIS — L820 Inflamed seborrheic keratosis: Secondary | ICD-10-CM | POA: Diagnosis not present

## 2020-04-23 DIAGNOSIS — Z1283 Encounter for screening for malignant neoplasm of skin: Secondary | ICD-10-CM | POA: Diagnosis not present

## 2020-04-23 DIAGNOSIS — L570 Actinic keratosis: Secondary | ICD-10-CM | POA: Diagnosis not present

## 2020-04-23 DIAGNOSIS — L821 Other seborrheic keratosis: Secondary | ICD-10-CM

## 2020-04-23 DIAGNOSIS — L814 Other melanin hyperpigmentation: Secondary | ICD-10-CM

## 2020-04-23 DIAGNOSIS — D1800 Hemangioma unspecified site: Secondary | ICD-10-CM | POA: Diagnosis not present

## 2020-04-23 DIAGNOSIS — L720 Epidermal cyst: Secondary | ICD-10-CM | POA: Diagnosis not present

## 2020-04-23 DIAGNOSIS — D229 Melanocytic nevi, unspecified: Secondary | ICD-10-CM

## 2020-04-23 NOTE — Patient Instructions (Signed)
Cryotherapy Aftercare  . Wash gently with soap and water everyday.   . Apply Vaseline and Band-Aid daily until healed.  

## 2020-04-23 NOTE — Progress Notes (Signed)
   Follow-Up Visit   Subjective  Ryan [name] is a 69 y.o. male who presents for the following: Skin Problem (Mole check, pt concerned about irritated moles on his back and shoulders) and Cyst (Cyst on the back for several years growing and changing, pt would like to have this cyst removed ). The patient presents for Upper Body Skin Exam (UBSE) for skin cancer screening and mole check.  The following portions of the chart were reviewed this encounter and updated as appropriate:   Tobacco  Allergies  Meds  Problems  Med Hx  Surg Hx  Fam Hx     Review of Systems:  No other skin or systemic complaints except as noted in HPI or Assessment and Plan.  Objective  Well appearing patient in no apparent distress; mood and affect are within normal limits.  All skin waist up examined.  Objective  Left infrascapular: 2.5 cm Subcutaneous nodule.   Objective  back, L shoulder (20): Erythematous keratotic or waxy stuck-on papule or plaque.   Objective  R zygoma: Erythematous thin papules/macules with gritty scale.    Assessment & Plan  Epidermal inclusion cyst Left infrascapular  Symptomatic, recommend surgery removal   Inflamed seborrheic keratosis (20) back, L shoulder  Destruction of lesion - back, L shoulder Complexity: simple   Destruction method: cryotherapy   Informed consent: discussed and consent obtained   Timeout:  patient name, date of birth, surgical site, and procedure verified Lesion destroyed using liquid nitrogen: Yes   Region frozen until ice ball extended beyond lesion: Yes   Outcome: patient tolerated procedure well with no complications   Post-procedure details: wound care instructions given    AK (actinic keratosis) R zygoma  Destruction of lesion - R zygoma Complexity: simple   Destruction method: cryotherapy   Informed consent: discussed and consent obtained   Timeout:  patient name, date of birth, surgical site, and procedure verified Lesion  destroyed using liquid nitrogen: Yes   Region frozen until ice ball extended beyond lesion: Yes   Outcome: patient tolerated procedure well with no complications   Post-procedure details: wound care instructions given    Skin cancer screening  Lentigines - Scattered tan macules - Discussed due to sun exposure - Benign, observe - Call for any changes  Seborrheic Keratoses - Stuck-on, waxy, tan-brown papules and plaques  - Discussed benign etiology and prognosis. - Observe - Call for any changes  Melanocytic Nevi - Tan-brown and/or pink-flesh-colored symmetric macules and papules - Benign appearing on exam today - Observation - Call clinic for new or changing moles - Recommend daily use of broad spectrum spf 30+ sunscreen to sun-exposed areas.   Hemangiomas - Red papules - Discussed benign nature - Observe - Call for any changes  Actinic Damage - Chronic, secondary to cumulative UV/sun exposure - diffuse scaly erythematous macules with underlying dyspigmentation - Recommend daily broad spectrum sunscreen SPF 30+ to sun-exposed areas, reapply every 2 hours as needed.  - Call for new or changing lesions.  Skin cancer screening performed today.  Return in about 1 year (around 04/23/2021) for TBSE, Cyst surgery .  IMarye Round, CMA, am acting as scribe for Sarina Ser, MD .  Documentation: I have reviewed the above documentation for accuracy and completeness, and I agree with the above.  Sarina Ser, MD

## 2020-04-29 ENCOUNTER — Encounter: Payer: Self-pay | Admitting: Dermatology

## 2020-04-29 ENCOUNTER — Telehealth: Payer: Self-pay | Admitting: Pharmacist

## 2020-04-29 NOTE — Progress Notes (Signed)
    Chronic Care Management Pharmacy Assistant   Name: Ryan [name]  [name]: [id number] DOB: Jun 25, 1950  Reason for Encounter: Patient Assistance Application     PCP : Venita Lick, NP  Allergies:   Allergies  Allergen Reactions  . Ace Inhibitors     Muscle aches   . Crestor [Rosuvastatin Calcium] Other (See Comments)    Muscle aches    Medications: Outpatient Encounter Medications as of 04/29/2020  Medication Sig  . canagliflozin (INVOKANA) 100 MG TABS tablet Take 1 tablet (100 mg total) by mouth daily before breakfast.  . carvedilol (COREG) 12.5 MG tablet Take 1 tablet (12.5 mg total) by mouth 2 (two) times daily.  . Cholecalciferol (VITAMIN D3 SUPER STRENGTH) 50 MCG (2000 UT) CAPS Take 2,000 Units by mouth daily.  Marland Kitchen dexlansoprazole (DEXILANT) 60 MG capsule Take 1 capsule (60 mg total) by mouth daily.  Marland Kitchen ENTRESTO 24-26 MG Take 1 tablet by mouth 2 (two) times daily.  . febuxostat (ULORIC) 40 MG tablet Take 1 tablet (40 mg total) by mouth daily.  Marland Kitchen levothyroxine (SYNTHROID) 50 MCG tablet Take 1 tablet (50 mcg total) by mouth daily.  Marland Kitchen spironolactone (ALDACTONE) 25 MG tablet Take 1 tablet (25 mg total) by mouth daily.   No facility-administered encounter medications on file as of 04/29/2020.    Current Diagnosis: Patient Active Problem List   Diagnosis Date Noted  . CKD stage 3 due to type 2 diabetes mellitus ([place]) 08/30/2019  . Type 2 diabetes mellitus with cardiac complication ([place]) 23/55/7322  . Hypothyroidism 05/26/2019  . Advanced care planning/counseling discussion 04/27/2017  . Systolic CHF ([place]) 02/54/2706  . BPH (benign prostatic hyperplasia) 04/14/2016  . Hypertension associated with diabetes ([place]) 12/16/2014  . Gout 12/16/2014  . Hyperlipidemia associated with type 2 diabetes mellitus ([place]) 06/13/2012  . Biventricular implantable cardioverter-defibrillator in situ 09/28/2010  . Idiopathic cardiomyopathy ([place]) 01/27/2009  . Myalgia due to statin 01/27/2009     Goals Addressed   None    04-29-2020: Spoke to the patient to explain that his patient assistance applications were needing his signature and proof of income before the provider could complete the application. Explained to the patient that he could stop by the PCP on 04-30-20 after 9 am. The patient verbalized understanding of information.   Cloretta Ned, LPN Clinical Pharmacist Assistant  [phone]   Follow-Up:  Patient Assistance Coordination

## 2020-04-30 ENCOUNTER — Ambulatory Visit (INDEPENDENT_AMBULATORY_CARE_PROVIDER_SITE_OTHER): Payer: Medicare Other

## 2020-04-30 DIAGNOSIS — I428 Other cardiomyopathies: Secondary | ICD-10-CM | POA: Diagnosis not present

## 2020-04-30 DIAGNOSIS — I5022 Chronic systolic (congestive) heart failure: Secondary | ICD-10-CM

## 2020-04-30 LAB — CUP PACEART REMOTE DEVICE CHECK
Battery Remaining Longevity: 33 mo
Battery Voltage: 2.96 V
Brady Statistic AP VP Percent: 11.8 %
Brady Statistic AP VS Percent: 0.01 %
Brady Statistic AS VP Percent: 88.19 %
Brady Statistic AS VS Percent: 0 %
Brady Statistic RA Percent Paced: 11.79 %
Brady Statistic RV Percent Paced: 99.98 %
Date Time Interrogation Session: 20211222033524
HighPow Impedance: 49 Ohm
HighPow Impedance: 61 Ohm
Implantable Lead Implant Date: 20060505
Implantable Lead Implant Date: 20120111
Implantable Lead Implant Date: 20180314
Implantable Lead Location: 753858
Implantable Lead Location: 753859
Implantable Lead Location: 753860
Implantable Lead Model: 5076
Implantable Lead Model: 6947
Implantable Pulse Generator Implant Date: 20180314
Lead Channel Impedance Value: 241.412
Lead Channel Impedance Value: 245.538
Lead Channel Impedance Value: 256.5 Ohm
Lead Channel Impedance Value: 261.164
Lead Channel Impedance Value: 261.164
Lead Channel Impedance Value: 399 Ohm
Lead Channel Impedance Value: 456 Ohm
Lead Channel Impedance Value: 513 Ohm
Lead Channel Impedance Value: 513 Ohm
Lead Channel Impedance Value: 532 Ohm
Lead Channel Impedance Value: 532 Ohm
Lead Channel Impedance Value: 589 Ohm
Lead Channel Impedance Value: 760 Ohm
Lead Channel Impedance Value: 760 Ohm
Lead Channel Impedance Value: 817 Ohm
Lead Channel Impedance Value: 817 Ohm
Lead Channel Impedance Value: 874 Ohm
Lead Channel Impedance Value: 893 Ohm
Lead Channel Pacing Threshold Amplitude: 0.625 V
Lead Channel Pacing Threshold Amplitude: 0.625 V
Lead Channel Pacing Threshold Amplitude: 1.5 V
Lead Channel Pacing Threshold Pulse Width: 0.4 ms
Lead Channel Pacing Threshold Pulse Width: 0.4 ms
Lead Channel Pacing Threshold Pulse Width: 0.4 ms
Lead Channel Sensing Intrinsic Amplitude: 1.625 mV
Lead Channel Sensing Intrinsic Amplitude: 1.625 mV
Lead Channel Setting Pacing Amplitude: 1.25 V
Lead Channel Setting Pacing Amplitude: 1.5 V
Lead Channel Setting Pacing Amplitude: 2 V
Lead Channel Setting Pacing Pulse Width: 0.4 ms
Lead Channel Setting Pacing Pulse Width: 0.4 ms
Lead Channel Setting Sensing Sensitivity: 0.3 mV

## 2020-05-05 ENCOUNTER — Telehealth: Payer: Self-pay

## 2020-05-05 NOTE — Telephone Encounter (Signed)
**Note De-Identified Ashna Dorough Obfuscation** We received a fax from [facility] that included the pts completed [employer] Pt Asst application for Delene Loll and his 2019 proof of income(?).  Per their request I have completed the provider page of the application and emailed all to Dr Aquilla Hacker nurse so she can obtain his signature, date it, and to fax all to [employer] pt asst foundation at the fax number written on the cover letter included.

## 2020-05-08 NOTE — Telephone Encounter (Signed)
Application has been printed, signed and faxed.

## 2020-05-12 ENCOUNTER — Other Ambulatory Visit: Payer: Self-pay | Admitting: Internal Medicine

## 2020-05-14 NOTE — Progress Notes (Signed)
Remote ICD transmission.   

## 2020-05-22 ENCOUNTER — Telehealth: Payer: Self-pay

## 2020-05-22 NOTE — Telephone Encounter (Signed)
We received a completed [employer] pt asst application and 3734 and 2021 proof of income from Margarita Sermons at [facility] with a request to [facility] the proider page of the application, have Dr Caryl Comes sign and date it and to fax to [employer] Pt [place].  Dr Caryl Comes has already signed and dated the provider page on 05/20/2020. I have completed the provider page and emailed all to Dr Aquilla Hacker nurse so she can fax to [place] at fax number written on cover letter included.

## 2020-05-23 ENCOUNTER — Telehealth: Payer: Self-pay | Admitting: Internal Medicine

## 2020-05-23 NOTE — Telephone Encounter (Signed)
Fax received to the [place] office today (dated 05/23/20) from [employer] stating they received the application  For the patient, but were unable to process this due to the application being incomplete.  - Missing physician signature - Missing physician signature date  Will forward to Lynn/ [facility] as they were involved with the application process and I don't have access to the physical application that was sent at this time.

## 2020-05-23 NOTE — Telephone Encounter (Signed)
I went back to check the pts application that was emailed to me and now notice that the pts page is missing. I will call the pt and ask him to bring [place] his part of the application as I feel that maybe the issue here.

## 2020-05-23 NOTE — Telephone Encounter (Signed)
The pt emailed me his part of the [employer] pt asst application and a copy of his ins card. I have added with the rest of the pts application and emailed all to Dr Aquilla Hacker nurse so she or Medical Records can fax all to [employer] at fax number written on cover letter included.

## 2020-05-23 NOTE — Telephone Encounter (Signed)
Placed with Medical Records to be faxed

## 2020-05-23 NOTE — Telephone Encounter (Signed)
Patient returning Lynn's call, connected call to [facility].

## 2020-05-23 NOTE — Telephone Encounter (Addendum)
**Note De-Identified Kirti Carl Obfuscation** The pt states that he gave his entire application to [place] at [facility] and he does not know how I got his application at the [facility] office in [place]. I advised him that Dr Caryl Comes as his cardiologist handles his Delene Loll and that is why Dr Hermenia Bers office faxed it to [place] so Dr Caryl Comes can sign it.   He states that he will print a [employer] application and will complete the patient page of the application and will email to me so we can fax to [employer] along with the rest of his application.  He thanked me for calling him to discuss.

## 2020-06-03 DIAGNOSIS — H43811 Vitreous degeneration, right eye: Secondary | ICD-10-CM | POA: Diagnosis not present

## 2020-06-03 DIAGNOSIS — E119 Type 2 diabetes mellitus without complications: Secondary | ICD-10-CM | POA: Diagnosis not present

## 2020-06-03 LAB — HM DIABETES EYE EXAM

## 2020-06-04 ENCOUNTER — Ambulatory Visit (INDEPENDENT_AMBULATORY_CARE_PROVIDER_SITE_OTHER): Payer: Medicare Other | Admitting: Nurse Practitioner

## 2020-06-04 ENCOUNTER — Encounter: Payer: Self-pay | Admitting: Nurse Practitioner

## 2020-06-04 ENCOUNTER — Other Ambulatory Visit: Payer: Self-pay

## 2020-06-04 ENCOUNTER — Ambulatory Visit: Payer: PRIVATE HEALTH INSURANCE

## 2020-06-04 VITALS — BP 132/85 | HR 71 | Temp 98.3°F | Ht 72.0 in | Wt 198.0 lb

## 2020-06-04 DIAGNOSIS — I429 Cardiomyopathy, unspecified: Secondary | ICD-10-CM

## 2020-06-04 DIAGNOSIS — E1169 Type 2 diabetes mellitus with other specified complication: Secondary | ICD-10-CM

## 2020-06-04 DIAGNOSIS — I152 Hypertension secondary to endocrine disorders: Secondary | ICD-10-CM | POA: Diagnosis not present

## 2020-06-04 DIAGNOSIS — E1122 Type 2 diabetes mellitus with diabetic chronic kidney disease: Secondary | ICD-10-CM

## 2020-06-04 DIAGNOSIS — Z0000 Encounter for general adult medical examination without abnormal findings: Secondary | ICD-10-CM

## 2020-06-04 DIAGNOSIS — E039 Hypothyroidism, unspecified: Secondary | ICD-10-CM

## 2020-06-04 DIAGNOSIS — E1159 Type 2 diabetes mellitus with other circulatory complications: Secondary | ICD-10-CM | POA: Diagnosis not present

## 2020-06-04 DIAGNOSIS — M791 Myalgia, unspecified site: Secondary | ICD-10-CM

## 2020-06-04 DIAGNOSIS — E785 Hyperlipidemia, unspecified: Secondary | ICD-10-CM

## 2020-06-04 DIAGNOSIS — I428 Other cardiomyopathies: Secondary | ICD-10-CM

## 2020-06-04 DIAGNOSIS — I5022 Chronic systolic (congestive) heart failure: Secondary | ICD-10-CM | POA: Diagnosis not present

## 2020-06-04 DIAGNOSIS — N400 Enlarged prostate without lower urinary tract symptoms: Secondary | ICD-10-CM | POA: Diagnosis not present

## 2020-06-04 DIAGNOSIS — N183 Chronic kidney disease, stage 3 unspecified: Secondary | ICD-10-CM

## 2020-06-04 DIAGNOSIS — T466X5A Adverse effect of antihyperlipidemic and antiarteriosclerotic drugs, initial encounter: Secondary | ICD-10-CM

## 2020-06-04 LAB — MICROALBUMIN, URINE WAIVED
Creatinine, Urine Waived: 50 mg/dL (ref 10–300)
Microalb, Ur Waived: 10 mg/L (ref 0–19)
Microalb/Creat Ratio: <30 mg/g (ref <30)

## 2020-06-04 LAB — BAYER DCA HB A1C WAIVED: HB A1C (BAYER DCA - WAIVED): 7 % — ABNORMAL HIGH (ref <7.0)

## 2020-06-04 NOTE — Assessment & Plan Note (Signed)
Stable, check PSA today. ?

## 2020-06-04 NOTE — Progress Notes (Signed)
BP 132/85   Pulse 71   Temp 98.3 F (36.8 C) (Oral)   Ht 6' (1.829 m)   Wt 198 lb (89.8 kg)   SpO2 98%   BMI 26.85 kg/m    Subjective:    Patient ID: Ryan [name], male    DOB: 10/03/1950, 70 y.o.   [name]: [id number]  HPI: [name] is a 70 y.o. male presenting on 06/04/2020 for comprehensive medical examination. Current medical complaints include:none  He currently lives with: wife Interim Problems from his last visit: no   DIABETES Last A1C 6.9% in October.  Takes Invokana.  Had intolerance to Metformin. Hypoglycemic episodes:no Polydipsia/polyuria: no Visual disturbance: no Chest pain: no Paresthesias: no Glucose Monitoring: no  Accucheck frequency: Not Checking  Fasting glucose:  Post prandial:  Evening:  Before meals: Taking Insulin?: no  Long acting insulin:  Short acting insulin: Blood Pressure Monitoring: daily Retinal Examination: Up to Date -- yesterday, [place] Foot Exam: Up to Date Pneumovax: refuses Influenza: refused Aspirin: no   HYPERTENSION / HYPERLIPIDEMIA/HF Is followed by cardiology, Dr. Caryl Comes, last saw him 02/12/20 and had cardio-defib implantable (replacement last done in January 2012) last check 04/30/20.  Continues on Aldactone, Entresto, Coreg.  No statin, reports cardiology took him off of this.  Tried Pravastatin in past with poor response -- myalgia.  Last LDL 86. Satisfied with current treatment? yes Duration of hypertension: chronic BP monitoring frequency: a few times a week BP range: 117/78 range BP medication side effects: no Duration of hyperlipidemia: chronic Medication compliance: good compliance Aspirin: no Recent stressors: no Recurrent headaches: no Visual changes: no Palpitations: no Dyspnea: no Chest pain: no Lower extremity edema: no Dizzy/lightheaded: no  The 10-year ASCVD risk score Mikey Bussing DC Jr., et al., 2013) is: 37%   Values used to calculate the score:     Age: 68 years     Sex: Male     Is Non-Hispanic  African American: No     Diabetic: Yes     Tobacco smoker: No     Systolic Blood Pressure: [id number] mmHg     Is BP treated: Yes     HDL Cholesterol: 33 mg/dL     Total Cholesterol: 150 mg/dL   HYPOTHYROIDISM Continues Levothyroxine 50 MCG. Thyroid control status:stable Satisfied with current treatment? yes Medication side effects: no Medication compliance: good compliance Etiology of hypothyroidism:  Recent dose adjustment:no Fatigue: no Cold intolerance: no Heat intolerance: no Weight gain: no Weight loss: no Constipation: no Diarrhea/loose stools: no Palpitations: no Lower extremity edema: no Anxiety/depressed mood: no  Functional Status Survey: Is the patient deaf or have difficulty hearing?: No Does the patient have difficulty seeing, even when wearing glasses/contacts?: No Does the patient have difficulty concentrating, remembering, or making decisions?: No Does the patient have difficulty walking or climbing stairs?: No Does the patient have difficulty dressing or bathing?: No Does the patient have difficulty doing errands alone such as visiting a doctor's office or shopping?: No  FALL RISK: Fall Risk  06/04/2020 02/28/2020 01/17/2019 08/07/2018 05/24/2018  Falls in the past year? 0 0 0 0 0  Number falls in past yr: - 0 - - -  Injury with Fall? - 0 - - -  Risk for fall due to : - No Fall Risks - - -  Follow up - Falls evaluation completed - Falls evaluation completed Falls evaluation completed    Depression Screen Depression screen [facility] 2/9 06/04/2020 05/29/2019 01/17/2019 05/24/2018 11/08/2017  Decreased Interest 0  0 0 0 0  Down, Depressed, Hopeless 0 0 0 0 0  PHQ - 2 Score 0 0 0 0 0  Altered sleeping 0 0 - 0 -  Tired, decreased energy 0 0 - 0 -  Change in appetite 0 0 - 0 -  Feeling bad or failure about yourself  0 0 - 0 -  Trouble concentrating 0 0 - 0 -  Moving slowly or fidgety/restless 0 0 - 0 -  Suicidal thoughts 0 0 - 0 -  PHQ-9 Score 0 0 - 0 -  Difficult doing  work/chores - Not difficult at all - - -    Advanced Directives <no information>  Past Medical History:  Past Medical History:  Diagnosis Date  . AICD (automatic cardioverter/defibrillator) present   . Anxiety   . Complete heart block ([place])   . Depression   . Diabetes mellitus, type 2 ([place])   . Dual ICD (implantable cardiac defibrillator-Medtronic    a. [id number] implanted originally s/p [phone] lead & generator change out 05/2010; b. upgraded to MDT CRT-D in 07/2016  . GERD (gastroesophageal reflux disease)   . Gout   . History of kidney stones   . Hypertension   . IBS (irritable bowel syndrome)   . Kidney stones   . Nonischemic cardiomyopathy ([place])    a. Echo 07/2012 EF 35-40%, severe HK of the mid-distalanterior myocardium w/ AK of the distalinferior myocardium, GR1DD, mild MR, mild to mod dilated LA; b/ echo 06/2016 EF 35-40%, severe HK of the mid-apicalanteroseptal and anterior myocardium and severe HK of the apicalinferior myocardium along with AK of the apical myocardium, GR1DD, calcified mitral annulus, mildly dilated left atrium  . Paroxysmal atrial fibrillation ([place])    a. CHADS2VASc => 3 (CHF, HTN, age x 1); b. on ASA only  . Presence of permanent cardiac pacemaker   . Syncope     Surgical History:  Past Surgical History:  Procedure Laterality Date  . BIV UPGRADE N/A 07/21/2016   Procedure: BiV Upgrade;  Surgeon: Deboraha Sprang, MD;  Location: [facility];  Service: Cardiovascular;  Laterality: N/A;  . CARDIAC DEFIBRILLATOR PLACEMENT    . DIRECT LARYNGOSCOPY Right 07/14/2017   Procedure: MICRODIRECT LARYNGOSCOPY WITH EXCISION OF RIGHT VOCAL CORD LESION WITH MICRO FLAP TECHNIQUE;  Surgeon: Carloyn Manner, MD;  Location: [facility];  Service: ENT;  Laterality: Right;  . LARYNGOSCOPY Right 01/06/2017   Procedure: LARYNGOSCOPY WITH EXCISION OF RIGHT VOCAL CORD LESION;  Surgeon: Carloyn Manner, MD;  Location: [facility];  Service: ENT;  Laterality: Right;  . LITHOTRIPSY    .  UPPER ENDOSCOPY W/ ESOPHAGEAL MANOMETRY      Medications:  Current Outpatient Medications on File Prior to Visit  Medication Sig  . canagliflozin (INVOKANA) 100 MG TABS tablet Take 1 tablet (100 mg total) by mouth daily before breakfast.  . carvedilol (COREG) 12.5 MG tablet Take 1 tablet (12.5 mg total) by mouth 2 (two) times daily.  . Cholecalciferol (VITAMIN D3 SUPER STRENGTH) 50 MCG (2000 UT) CAPS Take 2,000 Units by mouth daily.  Marland Kitchen dexlansoprazole (DEXILANT) 60 MG capsule Take 1 capsule (60 mg total) by mouth daily.  Marland Kitchen ENTRESTO 24-26 MG Take 1 tablet by mouth 2 (two) times daily.  . febuxostat (ULORIC) 40 MG tablet Take 1 tablet (40 mg total) by mouth daily.  Marland Kitchen levothyroxine (SYNTHROID) 50 MCG tablet Take 1 tablet (50 mcg total) by mouth daily.  Marland Kitchen spironolactone (ALDACTONE) 25 MG tablet Take 1 tablet (25 mg  total) by mouth daily.   No current facility-administered medications on file prior to visit.    Allergies:  Allergies  Allergen Reactions  . Ace Inhibitors     Muscle aches   . Crestor [Rosuvastatin Calcium] Other (See Comments)    Muscle aches    Social History:  Social History   Socioeconomic History  . Marital status: Married    Spouse name: Not on file  . Number of children: 1  . Years of education: 76  . Highest education level: 12th grade  Occupational History  . Occupation: Self Employed    Employer: [employer]  Tobacco Use  . Smoking status: Former Smoker    Types: Cigarettes    Quit date: 12/30/1989    Years since quitting: 30.4  . Smokeless tobacco: Never Used  Vaping Use  . Vaping Use: Never used  Substance and Sexual Activity  . Alcohol use: Yes    Alcohol/week: 7.0 standard drinks    Types: 7 Cans of beer per week  . Drug use: No  . Sexual activity: Yes  Other Topics Concern  . Not on file  Social History Narrative  . Not on file   Social Determinants of Health   Financial Resource Strain: Medium Risk  . Difficulty of Paying  Living Expenses: Somewhat hard  Food Insecurity: Not on file  Transportation Needs: Not on file  Physical Activity: Not on file  Stress: Not on file  Social Connections: Not on file  Intimate Partner Violence: Not on file   Social History   Tobacco Use  Smoking Status Former Smoker  . Types: Cigarettes  . Quit date: 12/30/1989  . Years since quitting: 30.4  Smokeless Tobacco Never Used   Social History   Substance and Sexual Activity  Alcohol Use Yes  . Alcohol/week: 7.0 standard drinks  . Types: 7 Cans of beer per week    Family History:  Family History  Problem Relation Age of Onset  . Breast cancer Mother   . Diabetes Mother   . Heart disease Mother   . Cancer Mother        breast  . Lung cancer Father   . Diabetes Father   . Cancer Father        lung  . Diabetes Sister     Past medical history, surgical history, medications, allergies, family history and social history reviewed with patient today and changes made to appropriate areas of the chart.   Review of Systems - negative All other ROS negative except what is listed above and in the HPI.      Objective:    BP 132/85   Pulse 71   Temp 98.3 F (36.8 C) (Oral)   Ht 6' (1.829 m)   Wt 198 lb (89.8 kg)   SpO2 98%   BMI 26.85 kg/m   Wt Readings from Last 3 Encounters:  06/04/20 198 lb (89.8 kg)  02/28/20 196 lb (88.9 kg)  02/12/20 198 lb (89.8 kg)    Physical Exam Vitals and nursing note reviewed.  Constitutional:      General: He is awake. He is not in acute distress.    Appearance: He is well-developed. He is overweight. He is not ill-appearing.  HENT:     Head: Normocephalic and atraumatic.     Right Ear: Hearing, tympanic membrane, ear canal and external ear normal. No drainage.     Left Ear: Hearing, tympanic membrane, ear canal and external ear normal. No drainage.  Nose: Nose normal.     Mouth/Throat:     Pharynx: Uvula midline.  Eyes:     General: Lids are normal.        Right  eye: No discharge.        Left eye: No discharge.     Extraocular Movements: Extraocular movements intact.     Conjunctiva/sclera: Conjunctivae normal.     Pupils: Pupils are equal, round, and reactive to light.     Visual Fields: Right eye visual fields normal and left eye visual fields normal.  Neck:     Thyroid: No thyromegaly.     Vascular: No carotid bruit or JVD.     Trachea: Trachea normal.  Cardiovascular:     Rate and Rhythm: Normal rate and regular rhythm.     Heart sounds: Normal heart sounds, S1 normal and S2 normal. No murmur. No gallop.   Pulmonary:     Effort: Pulmonary effort is normal. No accessory muscle usage or respiratory distress.     Breath sounds: Normal breath sounds.  Abdominal:     General: Bowel sounds are normal.     Palpations: Abdomen is soft. There is no hepatomegaly or splenomegaly.     Tenderness: There is no abdominal tenderness.  Genitourinary:    Comments: Deferred per patient request Musculoskeletal:        General: Normal range of motion.     Cervical back: Normal range of motion and neck supple.     Right lower leg: No edema.     Left lower leg: No edema.  Lymphadenopathy:     Head:     Right side of head: No submental, submandibular, tonsillar, preauricular or posterior auricular adenopathy.     Left side of head: No submental, submandibular, tonsillar, preauricular or posterior auricular adenopathy.     Cervical: No cervical adenopathy.  Skin:    General: Skin is warm and dry.     Capillary Refill: Capillary refill takes less than 2 seconds.     Findings: No rash.  Neurological:     Mental Status: He is alert and oriented to person, place, and time.     Cranial Nerves: Cranial nerves are intact.     Gait: Gait is intact.     Deep Tendon Reflexes: Reflexes are normal and symmetric.     Reflex Scores:      Brachioradialis reflexes are 2+ on the right side and 2+ on the left side.      Patellar reflexes are 2+ on the right side and 2+  on the left side. Psychiatric:        Attention and Perception: Attention normal.        Mood and Affect: Mood normal.        Speech: Speech normal.        Behavior: Behavior normal. Behavior is cooperative.        Thought Content: Thought content normal.        Cognition and Memory: Cognition normal.        Judgment: Judgment normal.   Diabetic Foot Exam - Simple   Simple Foot Form Visual Inspection No deformities, no ulcerations, no other skin breakdown bilaterally: Yes Sensation Testing Intact to touch and monofilament testing bilaterally: Yes Pulse Check Posterior Tibialis and Dorsalis pulse intact bilaterally: Yes Comments Good hair pattern bilaterally.     Results for orders placed or performed in visit on 06/03/20  HM DIABETES EYE EXAM  Result Value Ref Range   HM  Diabetic Eye Exam No Retinopathy No Retinopathy      Assessment & Plan:   Problem List Items Addressed This Visit      Cardiovascular and Mediastinum   Idiopathic cardiomyopathy ([place])    Ongoing, stable, followed by cardiology.  Continue this collaboration.      Hypertension associated with diabetes ([place])    Chronic, stable with BP at goal today and on home readings.  Continue current medication regimen and collaboration with cardiology.  Recommend checking BP at home at least a few mornings a week + focus on DASH diet.  BMP today.  Return in 3 months.      Relevant Orders   Bayer DCA Hb A1c Waived   Microalbumin, Urine Waived   Systolic CHF (HCC)    Chronic, stable.  Continue current medication regimen and collaboration with cardiology.  BMP today.   Recommend: - Reminded to call for an overnight weight gain of >2 pounds or a weekly weight weight of >5 pounds - not adding salt to his food and has been reading food labels. Reviewed the importance of keeping daily sodium intake to 2000mg  daily       Type 2 diabetes mellitus with cardiac complication (HCC) - Primary    Chronic, ongoing with HF.  A1C  today 7%, Christmas time, and urine ALB 30.  Continue current medication regimen and adjust as needed + continue collaboration with cardiology.  Recommend he check BS at home at least a few mornings a week.  He is to focus on diet heavily over next three months to reach A1C goal <7.  Continue to collaborate with CCM crew.  May increase Invokana to 300 MG next visit if elevated above goal OR add on GLP1.  At this time he wishes to continue current regimen.  Return to office in 3 months.      Relevant Orders   Bayer DCA Hb A1c Waived   Basic metabolic panel   Microalbumin, Urine Waived     Endocrine   Hyperlipidemia associated with type 2 diabetes mellitus (HCC)    Chronic, ongoing.  No current medication due to poor tolerance.   Would benefit from statin use with diabetes and heart health or consider injectable, discussed with him today.  LDL last visit was 82.  Lipid panel today.  He is agreeable to trial of Rosuvastatin on 3 day or 1 day a week schedule if LDL >70 this check, as current ASCVD 37%.      Relevant Orders   Bayer DCA Hb A1c Waived   Lipid Panel w/o Chol/HDL Ratio   Hypothyroidism    Chronic, ongoing.  Continue current medication regimen and adjust as needed based on labs.  TSH and Free T4 today.      Relevant Orders   TSH   T4, free   CKD stage 3 due to type 2 diabetes mellitus (HCC)    Ongoing, 3a.  Recheck BMP today and monitor closely.  Consider referral to nephrology if worsening.        Genitourinary   BPH (benign prostatic hyperplasia)    Stable, check PSA today.      Relevant Orders   PSA     Other   Myalgia due to statin    Due to statin therapy, history of on low doses.  Would benefit from statin use with diabetes and heart health or consider injectable, discussed with him today.  LDL last visit was 82.  Lipid panel today.  Other Visit Diagnoses    Encounter for annual physical exam       Annual physical labs today to include CBC, BMP, TSH,  lipid, PSA      Discussed aspirin prophylaxis for myocardial infarction prevention and decision was made to continue ASA  LABORATORY TESTING:  Health maintenance labs ordered today as discussed above.   The natural history of prostate cancer and ongoing controversy regarding screening and potential treatment outcomes of prostate cancer has been discussed with the patient. The meaning of a false positive PSA and a false negative PSA has been discussed. He indicates understanding of the limitations of this screening test and wishes  to proceed with screening PSA testing.   IMMUNIZATIONS:   - Tdap: Tetanus vaccination status reviewed: last tetanus booster within 10 years. - Influenza: Refused - Pneumovax: Up to date - Prevnar: Not applicable - Zostavax vaccine: Refused  SCREENING: - Colonoscopy: Refused  Discussed with patient purpose of the colonoscopy is to detect colon cancer at curable precancerous or early stages   - AAA Screening: Not applicable  -Hearing Test: Not applicable  -Spirometry: Not applicable   PATIENT COUNSELING:    Sexuality: Discussed sexually transmitted diseases, partner selection, use of condoms, avoidance of unintended pregnancy  and contraceptive alternatives.   Advised to avoid cigarette smoking.  I discussed with the patient that most people either abstain from alcohol or drink within safe limits (<=14/week and <=4 drinks/occasion for males, <=7/weeks and <= 3 drinks/occasion for females) and that the risk for alcohol disorders and other health effects rises proportionally with the number of drinks per week and how often a drinker exceeds daily limits.  Discussed cessation/primary prevention of drug use and availability of treatment for abuse.   Diet: Encouraged to adjust caloric intake to maintain  or achieve ideal body weight, to reduce intake of dietary saturated fat and total fat, to limit sodium intake by avoiding high sodium foods and not adding  table salt, and to maintain adequate dietary potassium and calcium preferably from fresh fruits, vegetables, and low-fat dairy products.    Stressed the importance of regular exercise  Injury prevention: Discussed safety belts, safety helmets, smoke detector, smoking near bedding or upholstery.   Dental health: Discussed importance of regular tooth brushing, flossing, and dental visits.   Follow up plan: NEXT PREVENTATIVE PHYSICAL DUE IN 1 YEAR. Return in about 3 months (around 09/02/2020) for T2DM, HTN/HLD.

## 2020-06-04 NOTE — Assessment & Plan Note (Signed)
Ongoing, 3a.  Recheck BMP today and monitor closely.  Consider referral to nephrology if worsening. 

## 2020-06-04 NOTE — Assessment & Plan Note (Signed)
Ongoing, stable, followed by cardiology.  Continue this collaboration. 

## 2020-06-04 NOTE — Assessment & Plan Note (Signed)
Chronic, ongoing.  No current medication due to poor tolerance.   Would benefit from statin use with diabetes and heart health or consider injectable, discussed with him today.  LDL last visit was 82.  Lipid panel today.  He is agreeable to trial of Rosuvastatin on 3 day or 1 day a week schedule if LDL >70 this check, as current ASCVD 37%.

## 2020-06-04 NOTE — Patient Instructions (Signed)
Diabetes Mellitus and Nutrition, Adult When you have diabetes, or diabetes mellitus, it is very important to have healthy eating habits because your blood sugar (glucose) levels are greatly affected by what you eat and drink. Eating healthy foods in the right amounts, at about the same times every day, can help you:  Control your blood glucose.  Lower your risk of heart disease.  Improve your blood pressure.  Reach or maintain a healthy weight. What can affect my meal plan? Every person with diabetes is different, and each person has different needs for a meal plan. Your health care provider may recommend that you work with a dietitian to make a meal plan that is best for you. Your meal plan may vary depending on factors such as:  The calories you need.  The medicines you take.  Your weight.  Your blood glucose, blood pressure, and cholesterol levels.  Your activity level.  Other health conditions you have, such as heart or kidney disease. How do carbohydrates affect me? Carbohydrates, also called carbs, affect your blood glucose level more than any other type of food. Eating carbs naturally raises the amount of glucose in your blood. Carb counting is a method for keeping track of how many carbs you eat. Counting carbs is important to keep your blood glucose at a healthy level, especially if you use insulin or take certain oral diabetes medicines. It is important to know how many carbs you can safely have in each meal. This is different for every person. Your dietitian can help you calculate how many carbs you should have at each meal and for each snack. How does alcohol affect me? Alcohol can cause a sudden decrease in blood glucose (hypoglycemia), especially if you use insulin or take certain oral diabetes medicines. Hypoglycemia can be a life-threatening condition. Symptoms of hypoglycemia, such as sleepiness, dizziness, and confusion, are similar to symptoms of having too much  alcohol.  Do not drink alcohol if: ? Your health care provider tells you not to drink. ? You are pregnant, may be pregnant, or are planning to become pregnant.  If you drink alcohol: ? Do not drink on an empty stomach. ? Limit how much you use to:  0-1 drink a day for women.  0-2 drinks a day for men. ? Be aware of how much alcohol is in your drink. In the [place] one drink equals one 12 oz bottle of beer (355 mL), one 5 oz glass of wine (148 mL), or one 1 oz glass of hard liquor (44 mL). ? Keep yourself hydrated with water, diet soda, or unsweetened iced tea.  Keep in mind that regular soda, juice, and other mixers may contain a lot of sugar and must be counted as carbs. What are tips for following this plan? Reading food labels  Start by checking the serving size on the "Nutrition Facts" label of packaged foods and drinks. The amount of calories, carbs, fats, and other nutrients listed on the label is based on one serving of the item. Many items contain more than one serving per package.  Check the total grams (g) of carbs in one serving. You can calculate the number of servings of carbs in one serving by dividing the total carbs by 15. For example, if a food has 30 g of total carbs per serving, it would be equal to 2 servings of carbs.  Check the number of grams (g) of saturated fats and trans fats in one serving. Choose foods that have   a low amount or none of these fats.  Check the number of milligrams (mg) of salt (sodium) in one serving. Most people should limit total sodium intake to less than 2,300 mg per day.  Always check the nutrition information of foods labeled as "low-fat" or "nonfat." These foods may be higher in added sugar or refined carbs and should be avoided.  Talk to your dietitian to identify your daily goals for nutrients listed on the label. Shopping  Avoid buying canned, pre-made, or processed foods. These foods tend to be high in fat, sodium, and added  sugar.  Shop around the outside edge of the grocery store. This is where you will most often find fresh fruits and vegetables, bulk grains, fresh meats, and fresh dairy. Cooking  Use low-heat cooking methods, such as baking, instead of high-heat cooking methods like deep frying.  Cook using healthy oils, such as olive, canola, or sunflower oil.  Avoid cooking with butter, cream, or high-fat meats. Meal planning  Eat meals and snacks regularly, preferably at the same times every day. Avoid going long periods of time without eating.  Eat foods that are high in fiber, such as fresh fruits, vegetables, beans, and whole grains. Talk with your dietitian about how many servings of carbs you can eat at each meal.  Eat 4-6 oz (112-168 g) of lean protein each day, such as lean meat, chicken, fish, eggs, or tofu. One ounce (oz) of lean protein is equal to: ? 1 oz (28 g) of meat, chicken, or fish. ? 1 egg. ?  cup (62 g) of tofu.  Eat some foods each day that contain healthy fats, such as avocado, nuts, seeds, and fish.   What foods should I eat? Fruits Berries. Apples. Oranges. Peaches. Apricots. Plums. Grapes. Mango. Papaya. Pomegranate. Kiwi. Cherries. Vegetables Lettuce. Spinach. Leafy greens, including kale, chard, collard greens, and mustard greens. Beets. Cauliflower. Cabbage. Broccoli. Carrots. Green beans. Tomatoes. Peppers. Onions. Cucumbers. Brussels sprouts. Grains Whole grains, such as whole-wheat or whole-grain bread, crackers, tortillas, cereal, and pasta. Unsweetened oatmeal. Quinoa. Brown or wild rice. Meats and other proteins Seafood. Poultry without skin. Lean cuts of poultry and beef. Tofu. Nuts. Seeds. Dairy Low-fat or fat-free dairy products such as milk, yogurt, and cheese. The items listed above may not be a complete list of foods and beverages you can eat. Contact a dietitian for more information. What foods should I avoid? Fruits Fruits canned with  syrup. Vegetables Canned vegetables. Frozen vegetables with butter or cream sauce. Grains Refined white flour and flour products such as bread, pasta, snack foods, and cereals. Avoid all processed foods. Meats and other proteins Fatty cuts of meat. Poultry with skin. Breaded or fried meats. Processed meat. Avoid saturated fats. Dairy Full-fat yogurt, cheese, or milk. Beverages Sweetened drinks, such as soda or iced tea. The items listed above may not be a complete list of foods and beverages you should avoid. Contact a dietitian for more information. Questions to ask a health care provider  Do I need to meet with a diabetes educator?  Do I need to meet with a dietitian?  What number can I call if I have questions?  When are the best times to check my blood glucose? Where to find more information:  American Diabetes Association: diabetes.org  Academy of Nutrition and Dietetics: www.eatright.org  National Institute of Diabetes and Digestive and Kidney Diseases: www.niddk.nih.gov  Association of Diabetes Care and Education Specialists: www.diabeteseducator.org Summary  It is important to have healthy eating   habits because your blood sugar (glucose) levels are greatly affected by what you eat and drink.  A healthy meal plan will help you control your blood glucose and maintain a healthy lifestyle.  Your health care provider may recommend that you work with a dietitian to make a meal plan that is best for you.  Keep in mind that carbohydrates (carbs) and alcohol have immediate effects on your blood glucose levels. It is important to count carbs and to use alcohol carefully. This information is not intended to replace advice given to you by your health care provider. Make sure you discuss any questions you have with your health care provider. Document Revised: 04/03/2019 Document Reviewed: 04/03/2019 Elsevier Patient Education  2021 Elsevier Inc.  

## 2020-06-04 NOTE — Assessment & Plan Note (Signed)
Due to statin therapy, history of on low doses.  Would benefit from statin use with diabetes and heart health or consider injectable, discussed with him today.  LDL last visit was 82.  Lipid panel today. °

## 2020-06-04 NOTE — Assessment & Plan Note (Signed)
Chronic, ongoing with HF.  A1C today 7%, Christmas time, and urine ALB 30.  Continue current medication regimen and adjust as needed + continue collaboration with cardiology.  Recommend he check BS at home at least a few mornings a week.  He is to focus on diet heavily over next three months to reach A1C goal <7.  Continue to collaborate with CCM crew.  May increase Invokana to 300 MG next visit if elevated above goal OR add on GLP1.  At this time he wishes to continue current regimen.  Return to office in 3 months.

## 2020-06-04 NOTE — Assessment & Plan Note (Signed)
Chronic, ongoing. Continue current medication regimen and adjust as needed based on labs.  TSH and Free T4 today. 

## 2020-06-04 NOTE — Assessment & Plan Note (Signed)
Chronic, stable with BP at goal today and on home readings.  Continue current medication regimen and collaboration with cardiology.  Recommend checking BP at home at least a few mornings a week + focus on DASH diet.  BMP today.  Return in 3 months.

## 2020-06-04 NOTE — Assessment & Plan Note (Addendum)
Chronic, stable.  Continue current medication regimen and collaboration with cardiology.  BMP today.   Recommend: - Reminded to call for an overnight weight gain of >2 pounds or a weekly weight weight of >5 pounds - not adding salt to his food and has been reading food labels. Reviewed the importance of keeping daily sodium intake to 2000mg  daily

## 2020-06-05 LAB — BASIC METABOLIC PANEL
BUN/Creatinine Ratio: 14 (ref 10–24)
BUN: 20 mg/dL (ref 8–27)
CO2: 21 mmol/L (ref 20–29)
Calcium: 9.6 mg/dL (ref 8.6–10.2)
Chloride: 102 mmol/L (ref 96–106)
Creatinine, Ser: 1.39 mg/dL — ABNORMAL HIGH (ref 0.76–1.27)
GFR calc Af Amer: 59 mL/min/{1.73_m2} — ABNORMAL LOW (ref >59)
GFR calc non Af Amer: 51 mL/min/{1.73_m2} — ABNORMAL LOW (ref >59)
Glucose: 290 mg/dL — ABNORMAL HIGH (ref 65–99)
Potassium: 4.8 mmol/L (ref 3.5–5.2)
Sodium: 137 mmol/L (ref 134–144)

## 2020-06-05 LAB — LIPID PANEL W/O CHOL/HDL RATIO
Cholesterol, Total: 147 mg/dL (ref 100–199)
HDL: 33 mg/dL — ABNORMAL LOW (ref >39)
LDL Chol Calc (NIH): 74 mg/dL (ref 0–99)
Triglycerides: 242 mg/dL — ABNORMAL HIGH (ref 0–149)
VLDL Cholesterol Cal: 40 mg/dL (ref 5–40)

## 2020-06-05 LAB — T4, FREE: Free T4: 1.41 ng/dL (ref 0.82–1.77)

## 2020-06-05 LAB — PSA: Prostate Specific Ag, Serum: 1.6 ng/mL (ref 0.0–4.0)

## 2020-06-05 LAB — TSH: TSH: 3.64 u[IU]/mL (ref 0.450–4.500)

## 2020-06-05 NOTE — Progress Notes (Signed)
Contacted via MyChart   Good afternoon Ryan [name], your labs have returned.  Glucose, sugar, was slightly elevated.  Kidney function continues to show mild kidney disease, but no decline so we will continue to monitor.  Thyroid labs normal, you can continue current Levothyroxine dosing.  Prostate lab normal.  Cholesterol levels continue to show triglycerides elevated at 242 and LDL just slightly above 70.  I think it would be good to trial Rosuvastatin a few days a week or weekly -- if muscle aches present then you would stop this.  Would you like to try this as we discussed?  Let me know. Keep being awesome!!  Thank you for allowing me to participate in your care. Kindest regards, Jahanna Raether

## 2020-06-09 ENCOUNTER — Telehealth: Payer: Self-pay

## 2020-06-09 NOTE — Addendum Note (Signed)
Addended by: [name] T on: 06/09/2020 04:43 PM   Modules accepted: Level of Service

## 2020-06-09 NOTE — Telephone Encounter (Signed)
Copied from [place] [id number]. Topic: General - Call Back - No Documentation >> Jun 09, 2020  1:21 PM Erick Blinks wrote: Reason for CRM: Pt wants a call back from PCP regarding medication for cholesterol. Please advise, has questions   Best contact: [phone]

## 2020-06-09 NOTE — Telephone Encounter (Signed)
Called patient, he would like a call to discuss his his cholesterol and the medications.

## 2020-06-09 NOTE — Telephone Encounter (Signed)
We discussed statin therapy and current labs, discussed diet options to lower levels.  At this time he wishes to concentrate on diet changes for 3 months and if ongoing elevated trigs or LDL above goal next visit will consider low dose statin a few days a week.  Agree with this plan and will monitor.

## 2020-06-19 ENCOUNTER — Other Ambulatory Visit: Payer: Self-pay | Admitting: Nurse Practitioner

## 2020-06-19 DIAGNOSIS — M1000 Idiopathic gout, unspecified site: Secondary | ICD-10-CM

## 2020-06-19 DIAGNOSIS — E039 Hypothyroidism, unspecified: Secondary | ICD-10-CM

## 2020-06-24 ENCOUNTER — Encounter: Payer: Self-pay | Admitting: Dermatology

## 2020-06-24 ENCOUNTER — Other Ambulatory Visit: Payer: Self-pay | Admitting: Dermatology

## 2020-06-24 ENCOUNTER — Ambulatory Visit (INDEPENDENT_AMBULATORY_CARE_PROVIDER_SITE_OTHER): Payer: Medicare Other | Admitting: Dermatology

## 2020-06-24 ENCOUNTER — Other Ambulatory Visit: Payer: Self-pay

## 2020-06-24 DIAGNOSIS — L720 Epidermal cyst: Secondary | ICD-10-CM

## 2020-06-24 MED ORDER — MUPIROCIN 2 % EX OINT: 1.0000 "application " | TOPICAL_OINTMENT | Freq: Every day | CUTANEOUS | 0 refills | Status: DC

## 2020-06-24 NOTE — Patient Instructions (Signed)

## 2020-06-24 NOTE — Progress Notes (Signed)
   Follow-Up Visit   Subjective  Ryan [name] is a 70 y.o. male who presents for the following: Cyst (Vs other of left infrascapular - Excise today).  The following portions of the chart were reviewed this encounter and updated as appropriate:   Tobacco  Allergies  Meds  Problems  Med Hx  Surg Hx  Fam Hx     Review of Systems:  No other skin or systemic complaints except as noted in HPI or Assessment and Plan.  Objective  Well appearing patient in no apparent distress; mood and affect are within normal limits.  A focused examination was performed including back. Relevant physical exam findings are noted in the Assessment and Plan.  Objective  Left infrascapular: 3.2 x 1.0 cm subcutaneous nodule.    Assessment & Plan  Epidermal inclusion cyst Left infrascapular  Skin excision - Left infrascapular  Lesion length (cm):  3.2 Lesion width (cm):  1 Margin per side (cm):  0 Total excision diameter (cm):  3.2 Informed consent: discussed and consent obtained   Timeout: patient name, date of birth, surgical site, and procedure verified   Procedure prep:  Patient was prepped and draped in usual sterile fashion Prep type:  Isopropyl alcohol and povidone-iodine Anesthesia: the lesion was anesthetized in a standard fashion   Anesthetic:  1% lidocaine w/ epinephrine 1-100,000 buffered w/ 8.4% NaHCO3 Instrument used: #15 blade   Hemostasis achieved with: pressure   Hemostasis achieved with comment:  Electrocautery Outcome: patient tolerated procedure well with no complications   Post-procedure details: sterile dressing applied and wound care instructions given   Dressing type: bandage and pressure dressing (mupirocin)    Skin repair - Left infrascapular Complexity:  Complex Final length (cm):  5 Reason for type of repair: reduce tension to allow closure, reduce the risk of dehiscence, infection, and necrosis, reduce subcutaneous dead space and avoid a hematoma, allow closure of  the large defect, preserve normal anatomy, preserve normal anatomical and functional relationships and enhance both functionality and cosmetic results   Undermining: area extensively undermined   Undermining comment:  Undermining defect 1.0 cm Subcutaneous layers (deep stitches):  Suture size:  2-0 Suture type: Vicryl (polyglactin 910)   Subcutaneous suture technique: inverted dermal. Fine/surface layer approximation (top stitches):  Suture size:  2-0 Suture type: nylon   Stitches: simple running   Suture removal (days):  7 Hemostasis achieved with: suture and pressure Outcome: patient tolerated procedure well with no complications   Post-procedure details: sterile dressing applied and wound care instructions given   Dressing type: bandage and pressure dressing (mupirocin)    mupirocin ointment (BACTROBAN) 2 % - Left infrascapular  Specimen 1 - Surgical pathology Differential Diagnosis: Cyst vs other  Check Margins: No 3.2 x 1.0 cm subcutaneous nodule.  Return in about 1 week (around 07/01/2020) for suture removal. I, Ashok Cordia, CMA, am acting as scribe for Sarina Ser, MD .  Documentation: I have reviewed the above documentation for accuracy and completeness, and I agree with the above.  Sarina Ser, MD

## 2020-07-01 ENCOUNTER — Other Ambulatory Visit: Payer: Self-pay

## 2020-07-01 ENCOUNTER — Ambulatory Visit (INDEPENDENT_AMBULATORY_CARE_PROVIDER_SITE_OTHER): Payer: Medicare Other | Admitting: Dermatology

## 2020-07-01 ENCOUNTER — Encounter: Payer: Self-pay | Admitting: Dermatology

## 2020-07-01 DIAGNOSIS — L720 Epidermal cyst: Secondary | ICD-10-CM | POA: Diagnosis not present

## 2020-07-01 DIAGNOSIS — L820 Inflamed seborrheic keratosis: Secondary | ICD-10-CM

## 2020-07-01 DIAGNOSIS — L578 Other skin changes due to chronic exposure to nonionizing radiation: Secondary | ICD-10-CM

## 2020-07-01 DIAGNOSIS — L570 Actinic keratosis: Secondary | ICD-10-CM

## 2020-07-01 NOTE — Progress Notes (Signed)
   Follow-Up Visit   Subjective  Ryan [name] is a 70 y.o. male who presents for the following: Suture / Staple Removal (Biopsy proven cyst L infrascapular ) and Skin Problem (Check irritated it growths at the L shoulder ).  The following portions of the chart were reviewed this encounter and updated as appropriate:   Tobacco  Allergies  Meds  Problems  Med Hx  Surg Hx  Fam Hx     Review of Systems:  No other skin or systemic complaints except as noted in HPI or Assessment and Plan.  Objective  Well appearing patient in no apparent distress; mood and affect are within normal limits.  A focused examination was performed including face,back,neck . Relevant physical exam findings are noted in the Assessment and Plan.  Objective  Left infrascapular: Well healed scar   Objective  L ear x 2, R zygoma x 1, R lateral canthus x 1 (4): Erythematous thin papules/macules with gritty scale.   Objective  L shoulder x 3 (3): Erythematous keratotic or waxy stuck-on papule or plaque.    Assessment & Plan  Epidermal cyst Left infrascapular  Biopsy proven cyst discussed   Encounter for Removal of Sutures - Incision site at the Left infrascapular  is clean, dry and intact - Wound cleansed, sutures removed, wound cleansed and steri strips applied.  - Discussed pathology results showing Epidermal cyst  - Patient advised to keep steri-strips dry until they fall off. - Scars remodel for a full year. - Once steri-strips fall off, patient can apply over-the-counter silicone scar cream each night to help with scar remodeling if desired. - Patient advised to call with any concerns or if they notice any new or changing lesions.   AK (actinic keratosis) (4) L ear x 2, R zygoma x 1, R lateral canthus x 1  Destruction of lesion - L ear x 2, R zygoma x 1, R lateral canthus x 1 Complexity: simple   Destruction method: cryotherapy   Informed consent: discussed and consent obtained   Timeout:   patient name, date of birth, surgical site, and procedure verified Lesion destroyed using liquid nitrogen: Yes   Region frozen until ice ball extended beyond lesion: Yes   Outcome: patient tolerated procedure well with no complications   Post-procedure details: wound care instructions given    Inflamed seborrheic keratosis (3) L shoulder x 3  Destruction of lesion - L shoulder x 3 Complexity: simple   Destruction method: cryotherapy   Informed consent: discussed and consent obtained   Timeout:  patient name, date of birth, surgical site, and procedure verified Lesion destroyed using liquid nitrogen: Yes   Region frozen until ice ball extended beyond lesion: Yes   Outcome: patient tolerated procedure well with no complications   Post-procedure details: wound care instructions given    Actinic Damage - chronic, secondary to cumulative UV radiation exposure/sun exposure over time - diffuse scaly erythematous macules with underlying dyspigmentation - Recommend daily broad spectrum sunscreen SPF 30+ to sun-exposed areas, reapply every 2 hours as needed.  - Call for new or changing lesions.  Return in about 10 months (around 04/23/2021).  IMarye Round, CMA, am acting as scribe for Sarina Ser, MD .  Documentation: I have reviewed the above documentation for accuracy and completeness, and I agree with the above.  Sarina Ser, MD

## 2020-07-09 ENCOUNTER — Ambulatory Visit: Payer: PRIVATE HEALTH INSURANCE

## 2020-07-10 ENCOUNTER — Other Ambulatory Visit: Payer: Self-pay | Admitting: Internal Medicine

## 2020-07-30 ENCOUNTER — Ambulatory Visit (INDEPENDENT_AMBULATORY_CARE_PROVIDER_SITE_OTHER): Payer: Medicare Other

## 2020-07-30 DIAGNOSIS — I428 Other cardiomyopathies: Secondary | ICD-10-CM | POA: Diagnosis not present

## 2020-07-30 LAB — CUP PACEART REMOTE DEVICE CHECK
Battery Remaining Longevity: 32 mo
Battery Voltage: 2.95 V
Brady Statistic AP VP Percent: 7.49 %
Brady Statistic AP VS Percent: 0.01 %
Brady Statistic AS VP Percent: 92.5 %
Brady Statistic AS VS Percent: 0 %
Brady Statistic RA Percent Paced: 7.49 %
Brady Statistic RV Percent Paced: 99.98 %
Date Time Interrogation Session: 20220323033525
HighPow Impedance: 47 Ohm
HighPow Impedance: 57 Ohm
Implantable Lead Implant Date: 20060505
Implantable Lead Implant Date: 20120111
Implantable Lead Implant Date: 20180314
Implantable Lead Location: 753858
Implantable Lead Location: 753859
Implantable Lead Location: 753860
Implantable Lead Model: 5076
Implantable Lead Model: 6947
Implantable Pulse Generator Implant Date: 20180314
Lead Channel Impedance Value: 1007 Ohm
Lead Channel Impedance Value: 232.653
Lead Channel Impedance Value: 237.5 Ohm
Lead Channel Impedance Value: 260.571
Lead Channel Impedance Value: 266.667
Lead Channel Impedance Value: 266.667
Lead Channel Impedance Value: 399 Ohm
Lead Channel Impedance Value: 456 Ohm
Lead Channel Impedance Value: 475 Ohm
Lead Channel Impedance Value: 475 Ohm
Lead Channel Impedance Value: 551 Ohm
Lead Channel Impedance Value: 608 Ohm
Lead Channel Impedance Value: 608 Ohm
Lead Channel Impedance Value: 760 Ohm
Lead Channel Impedance Value: 779 Ohm
Lead Channel Impedance Value: 836 Ohm
Lead Channel Impedance Value: 931 Ohm
Lead Channel Impedance Value: 950 Ohm
Lead Channel Pacing Threshold Amplitude: 0.625 V
Lead Channel Pacing Threshold Amplitude: 0.625 V
Lead Channel Pacing Threshold Amplitude: 1.375 V
Lead Channel Pacing Threshold Pulse Width: 0.4 ms
Lead Channel Pacing Threshold Pulse Width: 0.4 ms
Lead Channel Pacing Threshold Pulse Width: 0.4 ms
Lead Channel Sensing Intrinsic Amplitude: 0.75 mV
Lead Channel Sensing Intrinsic Amplitude: 0.75 mV
Lead Channel Setting Pacing Amplitude: 1.25 V
Lead Channel Setting Pacing Amplitude: 1.5 V
Lead Channel Setting Pacing Amplitude: 2 V
Lead Channel Setting Pacing Pulse Width: 0.4 ms
Lead Channel Setting Pacing Pulse Width: 0.4 ms
Lead Channel Setting Sensing Sensitivity: 0.3 mV

## 2020-08-05 ENCOUNTER — Telehealth: Payer: Self-pay | Admitting: Pharmacist

## 2020-08-05 NOTE — Chronic Care Management (AMB) (Addendum)
    Chronic Care Management Pharmacy Assistant   Name: Ryan [name]  [name]: [id number] DOB: 15-Sep-1950  Reason for Encounter: Disease State/General Adherence Call  Recent office visits:  06/04/2020 [facility] PCP [name], NP;  I think it would be good to trial Rosuvastatin a few days a week or weekly -- if muscle aches present then you would stop this.  Would you like to try this as we discussed?  Let me know.  Recent consult visits:  07/01/2020 [facility] (dermatology) Sarina Ser, MD; no medication changes indicated.  06/24/2020 [facility] (dermatology) Sarina Ser, MD; no medication changes indicated.  Hospital visits:  None in previous 6 months  Medications: Outpatient Encounter Medications as of 08/05/2020  Medication Sig   canagliflozin (INVOKANA) 100 MG TABS tablet Take 1 tablet (100 mg total) by mouth daily before breakfast.   carvedilol (COREG) 12.5 MG tablet Take 1 tablet (12.5 mg total) by mouth 2 (two) times daily.   Cholecalciferol (VITAMIN D3 SUPER STRENGTH) 50 MCG (2000 UT) CAPS Take 2,000 Units by mouth daily.   dexlansoprazole (DEXILANT) 60 MG capsule Take 1 capsule (60 mg total) by mouth daily.   ENTRESTO 24-26 MG Take 1 tablet by mouth 2 (two) times daily.   febuxostat (ULORIC) 40 MG tablet Take 1 tablet (40 mg total) by mouth daily.   levothyroxine (SYNTHROID) 50 MCG tablet Take 1 tablet (50 mcg total) by mouth daily.   mupirocin ointment (BACTROBAN) 2 % Apply 1 application topically daily. With dressing changes   spironolactone (ALDACTONE) 25 MG tablet Take 1 tablet (25 mg total) by mouth daily.   No facility-administered encounter medications on file as of 08/05/2020.    Have you had any problems recently with your health? Patient states he is doing well other than having laryngitis. He states he is improving.  Have you had any problems with your pharmacy? Patient states he has not had any problems recently with his pharmacy.  What issues or side effects are you having  with your medications? Patient states he is not currently having any side effects or issues with any of his medications.  What would you like me to pass along to Birdena Crandall, CPP for her to help you with?  Patient states he does not have anything to pass along at this time.  What can we do to take care of you better? Patient states he is doing well and doesn't need anything at this time.  Future Appointments  Date Time Provider [place]  09/03/2020  9:40 AM [name] T, NP [facility]  09/03/2020  9:45 AM [facility]FP CCM CASE MANAGER [facility]  10/29/2020  7:20 AM [facility] DEVICE REMOTES [facility]  01/28/2021  7:20 AM [facility] DEVICE REMOTES [facility]  04/23/2021  2:30 PM Ralene Bathe, MD [facility] None    Star Rating Drugs: n/a  April D Calhoun, [place] Pharmacist Assistant [phone]   I have reviewed the care management and care coordination activities outlined in this encounter and I am certifying that I agree with the content of this note.  Junita Push. Kenton Kingfisher PharmD, [place] [facility] [phone]

## 2020-08-06 ENCOUNTER — Other Ambulatory Visit: Payer: Self-pay | Admitting: Internal Medicine

## 2020-08-07 ENCOUNTER — Telehealth: Payer: Self-pay | Admitting: *Deleted

## 2020-08-07 NOTE — Telephone Encounter (Signed)
Pt requiring PA for Entresto 24-26 mg tablet. PA submitted via Covermymeds. Pt has been approved for Entresto 24-26 mg tablet.  Ryan [name] Key: [id number] - PA Case ID: [id number]  - Rx #: [id number] Need help? Call us at [phone]  Approved PA Case: [id number], Status: Approved, Coverage Starts on: 08/07/2020 12:00:00 AM, Coverage Ends on: 08/07/2021 12:00:00 AM

## 2020-08-11 ENCOUNTER — Other Ambulatory Visit: Payer: Self-pay | Admitting: Nurse Practitioner

## 2020-08-11 NOTE — Telephone Encounter (Signed)
Requested Prescriptions  Pending Prescriptions Disp Refills  . INVOKANA 100 MG TABS tablet [Pharmacy Med Name: INVOKANA 100 MG TABLET] 90 tablet 0    Sig: Take 1 tablet (100 mg total) by mouth daily before breakfast.     Endocrinology: Diabetes - SGLT2 Inhibitors - canagliflozin Failed - 08/11/2020  2:16 PM      Failed - Cr in normal range and within 360 days    Creatinine, Ser  Date Value Ref Range Status  06/04/2020 1.39 (H) 0.76 - 1.27 mg/dL Final         Failed - LDL in normal range and within 360 days    LDL Chol Calc (NIH)  Date Value Ref Range Status  06/04/2020 74 0 - 99 mg/dL Final         Failed - eGFR in normal range and within 360 days    GFR calc Af Amer  Date Value Ref Range Status  06/04/2020 59 (L) >59 mL/min/1.73 Final    Comment:    **In accordance with recommendations from the NKF-ASN Task force,**   [facility] is in the process of updating its eGFR calculation to the   2021 CKD-EPI creatinine equation that estimates kidney function   without a race variable.    GFR calc non Af Amer  Date Value Ref Range Status  06/04/2020 51 (L) >59 mL/min/1.73 Final         Passed - HBA1C is between 0 and 7.9 and within 180 days    Hemoglobin A1C  Date Value Ref Range Status  11/08/2017 6.9  Final  11/08/2017 6.9  Final   HB A1C (BAYER DCA - WAIVED)  Date Value Ref Range Status  06/04/2020 7.0 (H) <7.0 % Final    Comment:                                          Diabetic Adult            <7.0                                       Healthy Adult        4.3 - 5.7                                                           (DCCT/NGSP) American Diabetes Association's Summary of Glycemic Recommendations for Adults with Diabetes: Hemoglobin A1c <7.0%. More stringent glycemic goals (A1c <6.0%) may further reduce complications at the cost of increased risk of hypoglycemia.          Passed - Valid encounter within last 6 months    Recent Outpatient Visits          2  months ago Type 2 diabetes mellitus with cardiac complication ([place])   [place], [name] T, NP   5 months ago Type 2 diabetes mellitus with cardiac complication ([place])   [place], [name] T, NP   8 months ago Type 2 diabetes mellitus with cardiac complication ([place])   [place], [name] T, NP   11 months ago Type  2 diabetes mellitus with cardiac complication [facility])   [place], Barbaraann Faster, NP   1 year ago Annual physical exam   [place], Barbaraann Faster, NP      Future Appointments            In 3 weeks [place], Barbaraann Faster, NP [employer], [facility]

## 2020-08-11 NOTE — Progress Notes (Signed)
Remote ICD transmission.   

## 2020-08-15 ENCOUNTER — Other Ambulatory Visit: Payer: Self-pay | Admitting: Nurse Practitioner

## 2020-08-15 DIAGNOSIS — I429 Cardiomyopathy, unspecified: Secondary | ICD-10-CM

## 2020-08-15 DIAGNOSIS — I428 Other cardiomyopathies: Secondary | ICD-10-CM

## 2020-09-03 ENCOUNTER — Telehealth: Payer: Self-pay | Admitting: Pharmacist

## 2020-09-03 ENCOUNTER — Other Ambulatory Visit: Payer: Self-pay

## 2020-09-03 ENCOUNTER — Ambulatory Visit (INDEPENDENT_AMBULATORY_CARE_PROVIDER_SITE_OTHER): Payer: Medicare Other | Admitting: Nurse Practitioner

## 2020-09-03 ENCOUNTER — Encounter: Payer: Self-pay | Admitting: Nurse Practitioner

## 2020-09-03 ENCOUNTER — Ambulatory Visit (INDEPENDENT_AMBULATORY_CARE_PROVIDER_SITE_OTHER): Payer: Medicare Other | Admitting: General Practice

## 2020-09-03 ENCOUNTER — Ambulatory Visit: Payer: Medicare Other | Admitting: General Practice

## 2020-09-03 VITALS — BP 135/84 | HR 67 | Temp 97.9°F | Wt 200.2 lb

## 2020-09-03 DIAGNOSIS — E785 Hyperlipidemia, unspecified: Secondary | ICD-10-CM

## 2020-09-03 DIAGNOSIS — E1159 Type 2 diabetes mellitus with other circulatory complications: Secondary | ICD-10-CM | POA: Diagnosis not present

## 2020-09-03 DIAGNOSIS — N183 Chronic kidney disease, stage 3 unspecified: Secondary | ICD-10-CM

## 2020-09-03 DIAGNOSIS — I5022 Chronic systolic (congestive) heart failure: Secondary | ICD-10-CM

## 2020-09-03 DIAGNOSIS — I152 Hypertension secondary to endocrine disorders: Secondary | ICD-10-CM | POA: Diagnosis not present

## 2020-09-03 DIAGNOSIS — I428 Other cardiomyopathies: Secondary | ICD-10-CM

## 2020-09-03 DIAGNOSIS — T466X5A Adverse effect of antihyperlipidemic and antiarteriosclerotic drugs, initial encounter: Secondary | ICD-10-CM

## 2020-09-03 DIAGNOSIS — E1122 Type 2 diabetes mellitus with diabetic chronic kidney disease: Secondary | ICD-10-CM

## 2020-09-03 DIAGNOSIS — E039 Hypothyroidism, unspecified: Secondary | ICD-10-CM | POA: Diagnosis not present

## 2020-09-03 DIAGNOSIS — M791 Myalgia, unspecified site: Secondary | ICD-10-CM | POA: Diagnosis not present

## 2020-09-03 DIAGNOSIS — E1169 Type 2 diabetes mellitus with other specified complication: Secondary | ICD-10-CM

## 2020-09-03 DIAGNOSIS — I429 Cardiomyopathy, unspecified: Secondary | ICD-10-CM

## 2020-09-03 DIAGNOSIS — M1000 Idiopathic gout, unspecified site: Secondary | ICD-10-CM | POA: Diagnosis not present

## 2020-09-03 LAB — BAYER DCA HB A1C WAIVED: HB A1C (BAYER DCA - WAIVED): 7.8 % — ABNORMAL HIGH (ref <7.0)

## 2020-09-03 MED ORDER — ENTRESTO 24-26 MG PO TABS: 1.0000 | ORAL_TABLET | Freq: Two times a day (BID) | ORAL | 4 refills | Status: DC

## 2020-09-03 MED ORDER — DEXLANSOPRAZOLE 60 MG PO CPDR: 60.0000 mg | DELAYED_RELEASE_CAPSULE | Freq: Every day | ORAL | 4 refills | Status: DC

## 2020-09-03 MED ORDER — RYBELSUS 3 MG PO TABS: 3.0000 mg | ORAL_TABLET | Freq: Every day | ORAL | 3 refills | Status: DC

## 2020-09-03 MED ORDER — FEBUXOSTAT 40 MG PO TABS: 40.0000 mg | ORAL_TABLET | Freq: Every day | ORAL | 4 refills | Status: DC

## 2020-09-03 MED ORDER — CARVEDILOL 12.5 MG PO TABS: 12.5000 mg | ORAL_TABLET | Freq: Two times a day (BID) | ORAL | 4 refills | Status: DC

## 2020-09-03 MED ORDER — CANAGLIFLOZIN 100 MG PO TABS: 100.0000 mg | ORAL_TABLET | Freq: Every day | ORAL | 4 refills | Status: DC

## 2020-09-03 MED ORDER — SPIRONOLACTONE 25 MG PO TABS: 1.0000 | ORAL_TABLET | Freq: Every day | ORAL | 4 refills | Status: DC

## 2020-09-03 MED ORDER — LEVOTHYROXINE SODIUM 50 MCG PO TABS: 50.0000 ug | ORAL_TABLET | Freq: Every day | ORAL | 4 refills | Status: DC

## 2020-09-03 MED ORDER — SITAGLIPTIN PHOSPHATE 100 MG PO TABS: 100.0000 mg | ORAL_TABLET | Freq: Every day | ORAL | 4 refills | Status: DC

## 2020-09-03 NOTE — Assessment & Plan Note (Signed)
Refills sent in

## 2020-09-03 NOTE — Assessment & Plan Note (Signed)
Due to statin therapy, history of on low doses.  Would benefit from statin use with diabetes and heart health or consider injectable, discussed with him today.  LDL last visit was 74.  Lipid panel today.

## 2020-09-03 NOTE — Telephone Encounter (Signed)
Spoke with Ryan [name] regarding current A1c and copays for Rybelsus ($700) and Januvia ($400).  Patient should qualify for assistance from [employer] for Rybelsus. Will complete paperwork ASAP. Will also check on PAP for Invokana as patient has not received any notification from [employer].  If approval denied could apply for Farxiga assitance through [place].

## 2020-09-03 NOTE — Progress Notes (Signed)
Patient notified

## 2020-09-03 NOTE — Assessment & Plan Note (Signed)
Ongoing, stable, followed by cardiology.  Continue this collaboration. 

## 2020-09-03 NOTE — Assessment & Plan Note (Signed)
Chronic, ongoing with HF.  A1C today 7.8% and urine ALB 30 last visit.  Continue current medication regimen with addition of Rybelsus 3 MG daily (he would prefer to avoid injectable and can not increase Invokana further due to current eGFR) + continue collaboration with cardiology.  Recommend he check BS at home at least a few mornings a week with goal fasting <130, bring to next visit.  Continue to collaborate with CCM crew.  Return to office in 4 weeks and will adjust medications further as needed.

## 2020-09-03 NOTE — Addendum Note (Signed)
Addended by: [name] T on: 09/03/2020 10:55 AM   Modules accepted: Orders

## 2020-09-03 NOTE — Telephone Encounter (Signed)
Wonderful!!  Thank you Almyra Free, let me know what I need to do in regard to sending scripts, etc. When ready.  Thanks!!

## 2020-09-03 NOTE — Chronic Care Management (AMB) (Signed)
Chronic Care Management   CCM RN Visit Note  09/03/2020 Name: Ryan [name] [name]: [id number] DOB: Feb 17, 1951  Subjective: Ryan [name] is a 70 y.o. year old male who is a primary care patient of Cannady, Barbaraann Faster, NP. The care management team was consulted for assistance with disease management and care coordination needs.    Engaged with patient by telephone for follow up visit in response to provider referral for case management and/or care coordination services.   Consent to Services:  The patient was given information about Chronic Care Management services, agreed to services, and gave verbal consent prior to initiation of services.  Please see initial visit note for detailed documentation.   Patient agreed to services and verbal consent obtained.   Assessment: Review of patient past medical history, allergies, medications, health status, including review of consultants reports, laboratory and other test data, was performed as part of comprehensive evaluation and provision of chronic care management services.   SDOH (Social Determinants of Health) assessments and interventions performed:    CCM Care Plan  Allergies  Allergen Reactions  . Ace Inhibitors     Muscle aches   . Crestor [Rosuvastatin Calcium] Other (See Comments)    Muscle aches    Outpatient Encounter Medications as of 09/03/2020  Medication Sig  . canagliflozin (INVOKANA) 100 MG TABS tablet Take 1 tablet (100 mg total) by mouth daily before breakfast.  . carvedilol (COREG) 12.5 MG tablet Take 1 tablet (12.5 mg total) by mouth 2 (two) times daily.  . Cholecalciferol (VITAMIN D3 SUPER STRENGTH) 50 MCG (2000 UT) CAPS Take 2,000 Units by mouth daily.  Marland Kitchen dexlansoprazole (DEXILANT) 60 MG capsule Take 1 capsule (60 mg total) by mouth daily.  . febuxostat (ULORIC) 40 MG tablet Take 1 tablet (40 mg total) by mouth daily.  Marland Kitchen levothyroxine (SYNTHROID) 50 MCG tablet Take 1 tablet (50 mcg total) by mouth daily.  . mupirocin  ointment (BACTROBAN) 2 % Apply 1 application topically daily. With dressing changes  . sacubitril-valsartan (ENTRESTO) 24-26 MG Take 1 tablet by mouth 2 (two) times daily.  . sitaGLIPtin (JANUVIA) 100 MG tablet Take 1 tablet (100 mg total) by mouth daily.  Marland Kitchen spironolactone (ALDACTONE) 25 MG tablet Take 1 tablet (25 mg total) by mouth daily.   No facility-administered encounter medications on file as of 09/03/2020.    Patient Active Problem List   Diagnosis Date Noted  . CKD stage 3 due to type 2 diabetes mellitus ([place]) 08/30/2019  . Type 2 diabetes mellitus with cardiac complication ([place]) 93/79/0240  . Hypothyroidism 05/26/2019  . Advanced care planning/counseling discussion 04/27/2017  . Systolic CHF ([place]) 97/35/3299  . BPH (benign prostatic hyperplasia) 04/14/2016  . Hypertension associated with diabetes ([place]) 12/16/2014  . Gout 12/16/2014  . Hyperlipidemia associated with type 2 diabetes mellitus ([place]) 06/13/2012  . Biventricular implantable cardioverter-defibrillator in situ 09/28/2010  . Idiopathic cardiomyopathy ([place]) 01/27/2009  . Myalgia due to statin 01/27/2009    Conditions to be addressed/monitored:CHF, HTN, HLD and DMII  Care Plan : RNCM: Heart Failure (Adult)  Updates made by Vanita Ingles since 09/03/2020 12:00 AM    Problem: Symptom Exacerbation (Heart Failure)     Long-Range Goal: RNCM: HF Symptom Exacerbation Prevented or Minimized   Start Date: 04/15/2020  Expected End Date: 08/05/2021  Priority: Medium  Note:   Current Barriers:  Marland Kitchen Knowledge deficits related to basic heart failure pathophysiology and self care management . Does not adhere to provider recommendations re: weighing daily .  Lack of scale in home- has scales but does not weigh daily . Financial strain . Very active lifestyle, patient states- "I am busy all of the time" Nurse Case Manager Clinical Goal(s):   Over the next 90 days, patient will weigh self daily and record- ask patient to be  mindful of weight shifts and call for changes in weight  Over the next 90 days, patient will verbalize understanding of Heart Failure Action Plan and when to call doctor  Over the next 90 days, patient will take all Heart Failure mediations as prescribed Interventions:  . Basic overview and discussion of pathophysiology of Heart Failure . Provided written and verbal education on low sodium diet . Reviewed Heart Failure Action Plan in depth and provided written copy . Assessed for scales in home- has scales- last weight today 09-03-2020 is 200.  Marland Kitchen Discussed importance of daily weight- review with the patient. The patient verbalized he does not weigh daily but does not have issues with fluid shifts. Will monitor for changes. Encouraged the patient to call for weight gain of 3 pounds in one day or 5 pounds in one week. 09-03-2020: The patient states that he and his wife just got back from the beach and he is upset with himself about his eating habits. He is euvolemic today but knows he needs to work on his diet and make some changes. Empathetic listening and support given.  . Reviewed role of diuretics in prevention of fluid overload . - barriers to lifestyle changes reviewed and addressed . - barriers to treatment reviewed and addressed . - cognitive screening completed and reviewed . - health literacy screening completed or reviewed . - medication-adherence assessment completed . - self-awareness of signs/symptoms of worsening disease encouraged  Patient Goals/Self-Care Activities . Over the next 90 days, patient will:  - Take Heart Failure Medications as prescribed - Weigh daily and record (notify MD with 3 lb weight gain over night or 5 lb in a week)The patient states he does not weigh daily but he is mindful of any weight gain or changes  - Follow CHF Action Plan - Adhere to low sodium diet Follow Up Plan: Telephone follow up appointment with care management team member scheduled for:  11-12-2020 at 0900 am    Care Plan : RNCM: Diabetes Type 2 (Adult)  Updates made by Vanita Ingles since 09/03/2020 12:00 AM    Problem: Glycemic Management (Diabetes, Type 2)     Long-Range Goal: RNCM: DM care plan for patient with last hemoglobin A1C of 7.8 on 09-03-2020   Start Date: 04/15/2020  Expected End Date: 09/29/2021  Priority: High  Note:   Objective:  . Lab Results .  Component . Value . Date .   Marland Kitchen HGBA1C . 7.0 (H) . 06/04/2020 .   In the office at today's visit 09-03-2020: HGBA1C was 7.8% Lab Results  Component Value Date   CREATININE 1.35 (H) 02/28/2020   CREATININE 1.37 (H) 11/28/2019   CREATININE 1.42 (H) 08/29/2019 .   Marland Kitchen No results found for: EGFR Current Barriers:  Marland Kitchen Knowledge Deficits related to basic Diabetes pathophysiology and self care/management . Knowledge Deficits related to medications used for management of diabetes . Difficulty obtaining or cannot afford medications . Does not use cbg meter  . Unable to independently manage DM as evidence of hemoglobin A1C of 7.8% (09-03-2020)  Case Manager Clinical Goal(s):  Marland Kitchen Over the next 120 days, patient will demonstrate improved adherence to prescribed treatment plan for diabetes  self care/management as evidenced by: monitoring blood sugar per provider recommendations, adhering to a heart healthy/ADA diet and maintaining hemoglobin A1C of 7.0 or less . daily monitoring and recording of CBG- the patient does not check blood sugars daily. Education and support. Will collaborate with pcp to see what expectations of checking blood sugars are . adherence to ADA/ carb modified diet- 09-03-2020: Extensive education given to the patient today about the need to monitor dietary intake due to steady incline in hemoglobin A1C. The patient is upset with himself for poor dietary choices. States he and his wife just got back from the beach. He wants to do better. Empathetic listening and support.  Marland Kitchen exercise 4/5 days/week . adherence to  prescribed medication regimen.  . Interventions:  . Provided education to patient about basic DM disease process. 09-03-2020: Reviewed with the patient the importance of checking blood sugars regularly and writing down. Encouraged the patient to bring readings to the pcp appointment for review and recommendations.  . -Reviewed medications with patient and discussed importance of medication adherence. 09-03-2020: Will collaborate with pcp and pharm D on the patient not being able to afford the Rybelsus . Discussed plans with patient for ongoing care management follow up and provided patient with direct contact information for care management team . Provided patient with written educational materials related to hypo and hyperglycemia and importance of correct treatment. Discussed the importance of recognition of hypo and hyperglycemia and what sx and sx to look for. The patient does not check blood sugars. He denies any episodes of hypo and hyperglycemia.  Will continue to monitor and educate accordingly.  . Reviewed scheduled/upcoming provider appointments including: - . Review of patient status, including review of consultants reports, relevant laboratory and other test results, and medications completed. . - barriers to adherence to treatment plan  am identified . - blood glucose monitoring encouraged at  . - mutual A1C goal set or reviewed- 09-03-2020: Goal of hemoglobin A1C is 7 . - resources required to improve adherence to care identified. 09-03-2020: New medication started today Rybelsus. The patient states he went to the pharmacy after his visit and after insurance his cost is $700.00. The patient states he can not afford this and will need other options. He told the pcp this. Working with the pharm D to see if there is financial assistance for the patient.  Will continue to monitor.  . - self-awareness of signs/symptoms of hypo or hyperglycemia encouraged-- Review of sx and sx to monitor for with  hypo  and hyperglycemia  -Patient Goals/Self-Care Activities . Over the next 120 days, patient will:  - UNABLE to independently manage DM as evidence of hemoglobin A1C elevated Checks blood sugars as prescribed and utilize hyper and hypoglycemia protocol as needed Adheres to prescribed ADA/carb modified Follow Up Plan: Telephone follow up appointment with care management team member scheduled for:11-12-2020 at 0900 am    Care Plan : RNCM: Hypertension (Adult) and HLD  Updates made by Vanita Ingles since 09/03/2020 12:00 AM    Problem: Hypertension (Hypertension)     Long-Range Goal: RNCM: Management of Hypertension/HLD   Expected End Date: 10/05/2021  Note:   BP Readings from Last 3 Encounters:  09/03/20 135/84  06/04/20 132/85  02/28/20 132/84   Lab Results  Component Value Date   CHOL 147 06/04/2020   CHOL 150 02/28/2020   CHOL 156 11/28/2019   Lab Results  Component Value Date   HDL 33 (L) 06/04/2020   HDL  33 (L) 02/28/2020   HDL 35 (L) 11/28/2019   Lab Results  Component Value Date   LDLCALC 74 06/04/2020   LDLCALC 86 02/28/2020   LDLCALC 82 11/28/2019   Lab Results  Component Value Date   TRIG 242 (H) 06/04/2020   TRIG 180 (H) 02/28/2020   TRIG 232 (H) 11/28/2019   Lab Results  Component Value Date   CHOLHDL 4.0 05/24/2018   CHOLHDL 4.5 04/14/2016   No results found for: LDLDIRECT  Current Barriers:  . Chronic Disease Management support and education needs related to HTN and HLD  Nurse Case Manager Clinical Goal(s):  Marland Kitchen Over the next 120 days, patient will verbalize understanding of plan for HTN and HLD . Over the next 120 days, patient will meet with RN Care Manager to address barriers to managing HTN and HLD . Over the next 120 days, patient will attend all scheduled medical appointments: next appointment with pcp 10-01-2020 at 0840 am . Over the next 120 days, patient will demonstrate improved adherence to prescribed treatment plan for HTN and HLD as  evidenced bynormal range  Interventions:  . Inter-disciplinary care team collaboration (see longitudinal plan of care) . Evaluation of current treatment plan related to HTN and HLD and patient's adherence to plan as established by provider. 09-03-2020: The patient saw pcp today. Labwork done. The pcp wants the patient to start Rosuvastatin 3 mg daily due to intolerance of statins in the past. The patient agrees to this. The patient is hopeful that with dietary changes his Triglycerides are better; however is willing to try the medications.  . Advised patient to call for changes in blood pressure or sx/sx of worsening conditions. 09-03-2020: Advised the patient of increase risk of heart attack and stroke with elevated cholesterol levels and blood pressure. . Provided education to patient re: sx and sx of HTN, following a heart healthy diet, adequate exercise. 09-03-2020: Review and education given on dietary restrictions. Will send educational material by my chart system on diabetic and DASH diets.  . Reviewed medications with patient and discussed compliance. 09-03-2020: The patient is compliant with medications. Low tolerance in the past to statins but is willing to try low dose of Rosuvastatin.  . Discussed plans with patient for ongoing care management follow up and provided patient with direct contact information for care management team . Reviewed scheduled/upcoming provider appointments including: 10-01-2020 at [place] am  Patient Self Care Activities:  . Patient will self administer medications as prescribed . Patient will attend all scheduled provider appointments . Patient will call pharmacy for medication refills . Patient will call provider office for new concerns or questions . Unable to independently manage HTN and HLD as evidence of unable to take statins to manage HLD  Follow up with the patient on 11-12-2020 at 0900 am     Plan:Telephone follow up appointment with care management team  member scheduled for:  11-12-2020 at 0900 am  Noreene Larsson RN, MSN, [facility] Mobile: [phone]

## 2020-09-03 NOTE — Assessment & Plan Note (Signed)
Chronic, ongoing.  No current medication due to poor tolerance.   Would benefit from statin use with diabetes and heart health or consider injectable, discussed with him today.  LDL last visit was 74.  Lipid panel today.  He is agreeable to trial of Rosuvastatin on 3 day or 1 day a week schedule if LDL >70 this check, as current ASCVD 37%.

## 2020-09-03 NOTE — Assessment & Plan Note (Signed)
Chronic, stable.  Euvolemic today.  Continue current medication regimen and collaboration with cardiology.  BMP today.   Recommend: - Reminded to call for an overnight weight gain of >2 pounds or a weekly weight weight of >5 pounds - not adding salt to his food and has been reading food labels. Reviewed the importance of keeping daily sodium intake to 2000mg  daily

## 2020-09-03 NOTE — Patient Instructions (Signed)
Visit Information  PATIENT GOALS: Patient Care Plan: RNCM: Heart Failure (Adult)    Problem Identified: Symptom Exacerbation (Heart Failure)     Long-Range Goal: RNCM: HF Symptom Exacerbation Prevented or Minimized   Start Date: 04/15/2020  Expected End Date: 08/05/2021  Priority: Medium  Note:   Current Barriers:  Marland Kitchen Knowledge deficits related to basic heart failure pathophysiology and self care management . Does not adhere to provider recommendations re: weighing daily . Lack of scale in home- has scales but does not weigh daily . Financial strain . Very active lifestyle, patient states- "I am busy all of the time" Nurse Case Manager Clinical Goal(s):   Over the next 90 days, patient will weigh self daily and record- ask patient to be mindful of weight shifts and call for changes in weight  Over the next 90 days, patient will verbalize understanding of Heart Failure Action Plan and when to call doctor  Over the next 90 days, patient will take all Heart Failure mediations as prescribed Interventions:  . Basic overview and discussion of pathophysiology of Heart Failure . Provided written and verbal education on low sodium diet . Reviewed Heart Failure Action Plan in depth and provided written copy . Assessed for scales in home- has scales- last weight today 09-03-2020 is 200.  Marland Kitchen Discussed importance of daily weight- review with the patient. The patient verbalized he does not weigh daily but does not have issues with fluid shifts. Will monitor for changes. Encouraged the patient to call for weight gain of 3 pounds in one day or 5 pounds in one week. 09-03-2020: The patient states that he and his wife just got back from the beach and he is upset with himself about his eating habits. He is euvolemic today but knows he needs to work on his diet and make some changes. Empathetic listening and support given.  . Reviewed role of diuretics in prevention of fluid overload . - barriers to lifestyle  changes reviewed and addressed . - barriers to treatment reviewed and addressed . - cognitive screening completed and reviewed . - health literacy screening completed or reviewed . - medication-adherence assessment completed . - self-awareness of signs/symptoms of worsening disease encouraged  Patient Goals/Self-Care Activities . Over the next 90 days, patient will:  - Take Heart Failure Medications as prescribed - Weigh daily and record (notify MD with 3 lb weight gain over night or 5 lb in a week)The patient states he does not weigh daily but he is mindful of any weight gain or changes  - Follow CHF Action Plan - Adhere to low sodium diet Follow Up Plan: Telephone follow up appointment with care management team member scheduled for: 11-12-2020 at 0900 am    Task: RNCM: Identify and Minimize Risk of Heart Failure Exacerbation   Note:   Care Management Activities:    - barriers to lifestyle changes reviewed and addressed - barriers to treatment reviewed and addressed - cognitive screening completed and reviewed - health literacy screening completed or reviewed - medication-adherence assessment completed - self-awareness of signs/symptoms of worsening disease encouraged      Patient Care Plan: RNCM: Diabetes Type 2 (Adult)    Problem Identified: Glycemic Management (Diabetes, Type 2)     Long-Range Goal: RNCM: DM care plan for patient with last hemoglobin A1C of 7.8 on 09-03-2020   Start Date: 04/15/2020  Expected End Date: 09/29/2021  Priority: High  Note:   Objective:  . Lab Results .  Component . Value .  Date .   Marland Kitchen HGBA1C . 7.0 (H) . 06/04/2020 .   In the office at today's visit 09-03-2020: HGBA1C was 7.8% Lab Results  Component Value Date   CREATININE 1.35 (H) 02/28/2020   CREATININE 1.37 (H) 11/28/2019   CREATININE 1.42 (H) 08/29/2019 .   Marland Kitchen No results found for: EGFR Current Barriers:  Marland Kitchen Knowledge Deficits related to basic Diabetes pathophysiology and self  care/management . Knowledge Deficits related to medications used for management of diabetes . Difficulty obtaining or cannot afford medications . Does not use cbg meter  . Unable to independently manage DM as evidence of hemoglobin A1C of 7.8% (09-03-2020)  Case Manager Clinical Goal(s):  Marland Kitchen Over the next 120 days, patient will demonstrate improved adherence to prescribed treatment plan for diabetes self care/management as evidenced by: monitoring blood sugar per provider recommendations, adhering to a heart healthy/ADA diet and maintaining hemoglobin A1C of 7.0 or less . daily monitoring and recording of CBG- the patient does not check blood sugars daily. Education and support. Will collaborate with pcp to see what expectations of checking blood sugars are . adherence to ADA/ carb modified diet- 09-03-2020: Extensive education given to the patient today about the need to monitor dietary intake due to steady incline in hemoglobin A1C. The patient is upset with himself for poor dietary choices. States he and his wife just got back from the beach. He wants to do better. Empathetic listening and support.  Marland Kitchen exercise 4/5 days/week . adherence to prescribed medication regimen.  . Interventions:  . Provided education to patient about basic DM disease process. 09-03-2020: Reviewed with the patient the importance of checking blood sugars regularly and writing down. Encouraged the patient to bring readings to the pcp appointment for review and recommendations.  . -Reviewed medications with patient and discussed importance of medication adherence. 09-03-2020: Will collaborate with pcp and pharm D on the patient not being able to afford the Rybelsus . Discussed plans with patient for ongoing care management follow up and provided patient with direct contact information for care management team . Provided patient with written educational materials related to hypo and hyperglycemia and importance of correct treatment.  Discussed the importance of recognition of hypo and hyperglycemia and what sx and sx to look for. The patient does not check blood sugars. He denies any episodes of hypo and hyperglycemia.  Will continue to monitor and educate accordingly.  . Reviewed scheduled/upcoming provider appointments including: - . Review of patient status, including review of consultants reports, relevant laboratory and other test results, and medications completed. . - barriers to adherence to treatment plan  am identified . - blood glucose monitoring encouraged at  . - mutual A1C goal set or reviewed- 09-03-2020: Goal of hemoglobin A1C is 7 . - resources required to improve adherence to care identified. 09-03-2020: New medication started today Rybelsus. The patient states he went to the pharmacy after his visit and after insurance his cost is $700.00. The patient states he can not afford this and will need other options. He told the pcp this. Working with the pharm D to see if there is financial assistance for the patient.  Will continue to monitor.  . - self-awareness of signs/symptoms of hypo or hyperglycemia encouraged-- Review of sx and sx to monitor for with hypo  and hyperglycemia  -Patient Goals/Self-Care Activities . Over the next 120 days, patient will:  - UNABLE to independently manage DM as evidence of hemoglobin A1C elevated Checks blood sugars as prescribed  and utilize hyper and hypoglycemia protocol as needed Adheres to prescribed ADA/carb modified Follow Up Plan: Telephone follow up appointment with care management team member scheduled for:11-12-2020 at 0900 am    Task: RNCM: Alleviate Barriers to Glycemic Management   Note:   Care Management Activities:    - barriers to adherence to treatment plan identified - blood glucose monitoring encouraged - mutual A1C goal set or reviewed - resources required to improve adherence to care identified - self-awareness of signs/symptoms of hypo or hyperglycemia  encouraged      Patient Care Plan: RNCM: Hypertension (Adult) and HLD    Problem Identified: Hypertension (Hypertension)     Long-Range Goal: RNCM: Management of Hypertension/HLD   Expected End Date: 10/05/2021  Note:   BP Readings from Last 3 Encounters:  09/03/20 135/84  06/04/20 132/85  02/28/20 132/84   Lab Results  Component Value Date   CHOL 147 06/04/2020   CHOL 150 02/28/2020   CHOL 156 11/28/2019   Lab Results  Component Value Date   HDL 33 (L) 06/04/2020   HDL 33 (L) 02/28/2020   HDL 35 (L) 11/28/2019   Lab Results  Component Value Date   LDLCALC 74 06/04/2020   LDLCALC 86 02/28/2020   LDLCALC 82 11/28/2019   Lab Results  Component Value Date   TRIG 242 (H) 06/04/2020   TRIG 180 (H) 02/28/2020   TRIG 232 (H) 11/28/2019   Lab Results  Component Value Date   CHOLHDL 4.0 05/24/2018   CHOLHDL 4.5 04/14/2016   No results found for: LDLDIRECT  Current Barriers:  . Chronic Disease Management support and education needs related to HTN and HLD  Nurse Case Manager Clinical Goal(s):  Marland Kitchen Over the next 120 days, patient will verbalize understanding of plan for HTN and HLD . Over the next 120 days, patient will meet with RN Care Manager to address barriers to managing HTN and HLD . Over the next 120 days, patient will attend all scheduled medical appointments: next appointment with pcp 10-01-2020 at 0840 am . Over the next 120 days, patient will demonstrate improved adherence to prescribed treatment plan for HTN and HLD as evidenced bynormal range  Interventions:  . Inter-disciplinary care team collaboration (see longitudinal plan of care) . Evaluation of current treatment plan related to HTN and HLD and patient's adherence to plan as established by provider. 09-03-2020: The patient saw pcp today. Labwork done. The pcp wants the patient to start Rosuvastatin 3 mg daily due to intolerance of statins in the past. The patient agrees to this. The patient is hopeful that  with dietary changes his Triglycerides are better; however is willing to try the medications.  . Advised patient to call for changes in blood pressure or sx/sx of worsening conditions. 09-03-2020: Advised the patient of increase risk of heart attack and stroke with elevated cholesterol levels and blood pressure. . Provided education to patient re: sx and sx of HTN, following a heart healthy diet, adequate exercise. 09-03-2020: Review and education given on dietary restrictions. Will send educational material by my chart system on diabetic and DASH diets.  . Reviewed medications with patient and discussed compliance. 09-03-2020: The patient is compliant with medications. Low tolerance in the past to statins but is willing to try low dose of Rosuvastatin.  . Discussed plans with patient for ongoing care management follow up and provided patient with direct contact information for care management team . Reviewed scheduled/upcoming provider appointments including: 10-01-2020 at [place] am  Patient Self  Care Activities:  . Patient will self administer medications as prescribed . Patient will attend all scheduled provider appointments . Patient will call pharmacy for medication refills . Patient will call provider office for new concerns or questions . Unable to independently manage HTN and HLD as evidence of unable to take statins to manage HLD  Follow up with the patient on 11-12-2020 at 0900 am     Patient verbalizes understanding of instructions provided today and agrees to view in [place].   Telephone follow up appointment with care management team member scheduled for: 11-12-2020 at 0900 am  Noreene Larsson RN, MSN, [facility] Mobile: [phone]   [email or web address]">  DASH Eating Plan DASH stands for Dietary Approaches to Stop Hypertension. The DASH eating plan is a  healthy eating plan that has been shown to:  Reduce high blood pressure (hypertension).  Reduce your risk for type 2 diabetes, heart disease, and stroke.  Help with weight loss. What are tips for following this plan? Reading food labels  Check food labels for the amount of salt (sodium) per serving. Choose foods with less than 5 percent of the Daily Value of sodium. Generally, foods with less than 300 milligrams (mg) of sodium per serving fit into this eating plan.  To find whole grains, look for the word "whole" as the first word in the ingredient list. Shopping  Buy products labeled as "low-sodium" or "no salt added."  Buy fresh foods. Avoid canned foods and pre-made or frozen meals. Cooking  Avoid adding salt when cooking. Use salt-free seasonings or herbs instead of table salt or sea salt. Check with your health care provider or pharmacist before using salt substitutes.  Do not fry foods. Cook foods using healthy methods such as baking, boiling, grilling, roasting, and broiling instead.  Cook with heart-healthy oils, such as olive, canola, avocado, soybean, or sunflower oil. Meal planning  Eat a balanced diet that includes: ? 4 or more servings of fruits and 4 or more servings of vegetables each day. Try to fill one-half of your plate with fruits and vegetables. ? 6-8 servings of whole grains each day. ? Less than 6 oz (170 g) of lean meat, poultry, or fish each day. A 3-oz (85-g) serving of meat is about the same size as a deck of cards. One egg equals 1 oz (28 g). ? 2-3 servings of low-fat dairy each day. One serving is 1 cup (237 mL). ? 1 serving of nuts, seeds, or beans 5 times each week. ? 2-3 servings of heart-healthy fats. Healthy fats called omega-3 fatty acids are found in foods such as walnuts, flaxseeds, fortified milks, and eggs. These fats are also found in cold-water fish, such as sardines, salmon, and mackerel.  Limit how much you eat of: ? Canned or prepackaged  foods. ? Food that is high in trans fat, such as some fried foods. ? Food that is high in saturated fat, such as fatty meat. ? Desserts and other sweets, sugary drinks, and other foods with added sugar. ? Full-fat dairy products.  Do not salt foods before eating.  Do not eat more than 4 egg yolks a week.  Try to eat at least 2 vegetarian meals a week.  Eat more home-cooked food and less restaurant, buffet, and fast food.   Lifestyle  When eating at a restaurant, ask that your food be prepared with less salt or no salt, if possible.  If you  drink alcohol: ? Limit how much you use to:  0-1 drink a day for women who are not pregnant.  0-2 drinks a day for men. ? Be aware of how much alcohol is in your drink. In the [place] one drink equals one 12 oz bottle of beer (355 mL), one 5 oz glass of wine (148 mL), or one 1 oz glass of hard liquor (44 mL). General information  Avoid eating more than 2,300 mg of salt a day. If you have hypertension, you may need to reduce your sodium intake to 1,500 mg a day.  Work with your health care provider to maintain a healthy body weight or to lose weight. Ask what an ideal weight is for you.  Get at least 30 minutes of exercise that causes your heart to beat faster (aerobic exercise) most days of the week. Activities may include walking, swimming, or biking.  Work with your health care provider or dietitian to adjust your eating plan to your individual calorie needs. What foods should I eat? Fruits All fresh, dried, or frozen fruit. Canned fruit in natural juice (without added sugar). Vegetables Fresh or frozen vegetables (raw, steamed, roasted, or grilled). Low-sodium or reduced-sodium tomato and vegetable juice. Low-sodium or reduced-sodium tomato sauce and tomato paste. Low-sodium or reduced-sodium canned vegetables. Grains Whole-grain or whole-wheat bread. Whole-grain or whole-wheat pasta. Brown rice. [place]. Bulgur. Whole-grain and  low-sodium cereals. Pita bread. Low-fat, low-sodium crackers. Whole-wheat flour tortillas. Meats and other proteins Skinless chicken or [place]. Ground chicken or [place]. Pork with fat trimmed off. Fish and seafood. Egg whites. Dried beans, peas, or lentils. Unsalted nuts, nut butters, and seeds. Unsalted canned beans. Lean cuts of beef with fat trimmed off. Low-sodium, lean precooked or cured meat, such as sausages or meat loaves. Dairy Low-fat (1%) or fat-free (skim) milk. Reduced-fat, low-fat, or fat-free cheeses. Nonfat, low-sodium ricotta or cottage cheese. Low-fat or nonfat yogurt. Low-fat, low-sodium cheese. Fats and oils Soft margarine without trans fats. Vegetable oil. Reduced-fat, low-fat, or light mayonnaise and salad dressings (reduced-sodium). Canola, safflower, olive, avocado, soybean, and sunflower oils. Avocado. Seasonings and condiments Herbs. Spices. Seasoning mixes without salt. Other foods Unsalted popcorn and pretzels. Fat-free sweets. The items listed above may not be a complete list of foods and beverages you can eat. Contact a dietitian for more information. What foods should I avoid? Fruits Canned fruit in a light or heavy syrup. Fried fruit. Fruit in cream or butter sauce. Vegetables Creamed or fried vegetables. Vegetables in a cheese sauce. Regular canned vegetables (not low-sodium or reduced-sodium). Regular canned tomato sauce and paste (not low-sodium or reduced-sodium). Regular tomato and vegetable juice (not low-sodium or reduced-sodium). Angie Fava. Olives. Grains Baked goods made with fat, such as croissants, muffins, or some breads. Dry pasta or rice meal packs. Meats and other proteins Fatty cuts of meat. Ribs. Fried meat. Berniece Salines. Bologna, salami, and other precooked or cured meats, such as sausages or meat loaves. Fat from the back of a pig (fatback). Bratwurst. Salted nuts and seeds. Canned beans with added salt. Canned or smoked fish. Whole eggs or egg yolks.  Chicken or [place] with skin. Dairy Whole or 2% milk, cream, and half-and-half. Whole or full-fat cream cheese. Whole-fat or sweetened yogurt. Full-fat cheese. Nondairy creamers. Whipped toppings. Processed cheese and cheese spreads. Fats and oils Butter. Stick margarine. Lard. Shortening. Ghee. Bacon fat. Tropical oils, such as coconut, palm kernel, or palm oil. Seasonings and condiments Onion salt, garlic salt, seasoned salt, table salt, and sea salt.  Worcestershire sauce. Tartar sauce. Barbecue sauce. Teriyaki sauce. Soy sauce, including reduced-sodium. Steak sauce. Canned and packaged gravies. Fish sauce. Oyster sauce. Cocktail sauce. Store-bought horseradish. Ketchup. Mustard. Meat flavorings and tenderizers. Bouillon cubes. Hot sauces. Pre-made or packaged marinades. Pre-made or packaged taco seasonings. Relishes. Regular salad dressings. Other foods Salted popcorn and pretzels. The items listed above may not be a complete list of foods and beverages you should avoid. Contact a dietitian for more information. Where to find more information  National Heart, Lung, and Blood Institute: https://wilson-eaton.com/  American Heart Association: www.heart.org  Academy of Nutrition and Dietetics: www.eatright.[place]: www.kidney.org Summary  The DASH eating plan is a healthy eating plan that has been shown to reduce high blood pressure (hypertension). It may also reduce your risk for type 2 diabetes, heart disease, and stroke.  When on the DASH eating plan, aim to eat more fresh fruits and vegetables, whole grains, lean proteins, low-fat dairy, and heart-healthy fats.  With the DASH eating plan, you should limit salt (sodium) intake to 2,300 mg a day. If you have hypertension, you may need to reduce your sodium intake to 1,500 mg a day.  Work with your health care provider or dietitian to adjust your eating plan to your individual calorie needs. This information is not intended  to replace advice given to you by your health care provider. Make sure you discuss any questions you have with your health care provider. Document Revised: 03/30/2019 Document Reviewed: 03/30/2019 Elsevier Patient Education  2021 [place].  Diabetes Mellitus and Nutrition, Adult When you have diabetes, or diabetes mellitus, it is very important to have healthy eating habits because your blood sugar (glucose) levels are greatly affected by what you eat and drink. Eating healthy foods in the right amounts, at about the same times every day, can help you:  Control your blood glucose.  Lower your risk of heart disease.  Improve your blood pressure.  Reach or maintain a healthy weight. What can affect my meal plan? Every person with diabetes is different, and each person has different needs for a meal plan. Your health care provider may recommend that you work with a dietitian to make a meal plan that is best for you. Your meal plan may vary depending on factors such as:  The calories you need.  The medicines you take.  Your weight.  Your blood glucose, blood pressure, and cholesterol levels.  Your activity level.  Other health conditions you have, such as heart or kidney disease. How do carbohydrates affect me? Carbohydrates, also called carbs, affect your blood glucose level more than any other type of food. Eating carbs naturally raises the amount of glucose in your blood. Carb counting is a method for keeping track of how many carbs you eat. Counting carbs is important to keep your blood glucose at a healthy level, especially if you use insulin or take certain oral diabetes medicines. It is important to know how many carbs you can safely have in each meal. This is different for every person. Your dietitian can help you calculate how many carbs you should have at each meal and for each snack. How does alcohol affect me? Alcohol can cause a sudden decrease in blood glucose  (hypoglycemia), especially if you use insulin or take certain oral diabetes medicines. Hypoglycemia can be a life-threatening condition. Symptoms of hypoglycemia, such as sleepiness, dizziness, and confusion, are similar to symptoms of having too much alcohol.  Do not drink alcohol if: ?  Your health care provider tells you not to drink. ? You are pregnant, may be pregnant, or are planning to become pregnant.  If you drink alcohol: ? Do not drink on an empty stomach. ? Limit how much you use to:  0-1 drink a day for women.  0-2 drinks a day for men. ? Be aware of how much alcohol is in your drink. In the [place] one drink equals one 12 oz bottle of beer (355 mL), one 5 oz glass of wine (148 mL), or one 1 oz glass of hard liquor (44 mL). ? Keep yourself hydrated with water, diet soda, or unsweetened iced tea.  Keep in mind that regular soda, juice, and other mixers may contain a lot of sugar and must be counted as carbs. What are tips for following this plan? Reading food labels  Start by checking the serving size on the "Nutrition Facts" label of packaged foods and drinks. The amount of calories, carbs, fats, and other nutrients listed on the label is based on one serving of the item. Many items contain more than one serving per package.  Check the total grams (g) of carbs in one serving. You can calculate the number of servings of carbs in one serving by dividing the total carbs by 15. For example, if a food has 30 g of total carbs per serving, it would be equal to 2 servings of carbs.  Check the number of grams (g) of saturated fats and trans fats in one serving. Choose foods that have a low amount or none of these fats.  Check the number of milligrams (mg) of salt (sodium) in one serving. Most people should limit total sodium intake to less than 2,300 mg per day.  Always check the nutrition information of foods labeled as "low-fat" or "nonfat." These foods may be higher in added sugar or  refined carbs and should be avoided.  Talk to your dietitian to identify your daily goals for nutrients listed on the label. Shopping  Avoid buying canned, pre-made, or processed foods. These foods tend to be high in fat, sodium, and added sugar.  Shop around the outside edge of the grocery store. This is where you will most often find fresh fruits and vegetables, bulk grains, fresh meats, and fresh dairy. Cooking  Use low-heat cooking methods, such as baking, instead of high-heat cooking methods like deep frying.  Cook using healthy oils, such as olive, canola, or sunflower oil.  Avoid cooking with butter, cream, or high-fat meats. Meal planning  Eat meals and snacks regularly, preferably at the same times every day. Avoid going long periods of time without eating.  Eat foods that are high in fiber, such as fresh fruits, vegetables, beans, and whole grains. Talk with your dietitian about how many servings of carbs you can eat at each meal.  Eat 4-6 oz (112-168 g) of lean protein each day, such as lean meat, chicken, fish, eggs, or tofu. One ounce (oz) of lean protein is equal to: ? 1 oz (28 g) of meat, chicken, or fish. ? 1 egg. ?  cup (62 g) of tofu.  Eat some foods each day that contain healthy fats, such as avocado, nuts, seeds, and fish.   What foods should I eat? Fruits Berries. Apples. Oranges. Peaches. Apricots. Plums. Grapes. Mango. Papaya. Pomegranate. Kiwi. Cherries. Vegetables Lettuce. Spinach. Leafy greens, including kale, chard, collard greens, and mustard greens. Beets. Cauliflower. Cabbage. Broccoli. Carrots. Green beans. Tomatoes. Peppers. Onions. Cucumbers. Brussels sprouts. Grains  Whole grains, such as whole-wheat or whole-grain bread, crackers, tortillas, cereal, and pasta. Unsweetened oatmeal. Quinoa. Brown or wild rice. Meats and other proteins Seafood. Poultry without skin. Lean cuts of poultry and beef. Tofu. Nuts. Seeds. Dairy Low-fat or fat-free dairy  products such as milk, yogurt, and cheese. The items listed above may not be a complete list of foods and beverages you can eat. Contact a dietitian for more information. What foods should I avoid? Fruits Fruits canned with syrup. Vegetables Canned vegetables. Frozen vegetables with butter or cream sauce. Grains Refined white flour and flour products such as bread, pasta, snack foods, and cereals. Avoid all processed foods. Meats and other proteins Fatty cuts of meat. Poultry with skin. Breaded or fried meats. Processed meat. Avoid saturated fats. Dairy Full-fat yogurt, cheese, or milk. Beverages Sweetened drinks, such as soda or iced tea. The items listed above may not be a complete list of foods and beverages you should avoid. Contact a dietitian for more information. Questions to ask a health care provider  Do I need to meet with a diabetes educator?  Do I need to meet with a dietitian?  What number can I call if I have questions?  When are the best times to check my blood glucose? Where to find more information:  American Diabetes Association: diabetes.org  Academy of Nutrition and Dietetics: www.eatright.[employer] of Diabetes and Digestive and Kidney Diseases: [employer]  Association of Diabetes Care and Education Specialists: www.diabeteseducator.org Summary  It is important to have healthy eating habits because your blood sugar (glucose) levels are greatly affected by what you eat and drink.  A healthy meal plan will help you control your blood glucose and maintain a healthy lifestyle.  Your health care provider may recommend that you work with a dietitian to make a meal plan that is best for you.  Keep in mind that carbohydrates (carbs) and alcohol have immediate effects on your blood glucose levels. It is important to count carbs and to use alcohol carefully. This information is not intended to replace advice given to you by your health care  provider. Make sure you discuss any questions you have with your health care provider. Document Revised: 04/03/2019 Document Reviewed: 04/03/2019 Elsevier Patient Education  2021 [employer].

## 2020-09-03 NOTE — Assessment & Plan Note (Signed)
Chronic, ongoing.  Continue current medication regimen and adjust as needed based on labs.  TSH and Free T4 up to date.

## 2020-09-03 NOTE — Assessment & Plan Note (Signed)
Ongoing, 3a.  Recheck BMP today and monitor closely.  Consider referral to nephrology if worsening. 

## 2020-09-03 NOTE — Assessment & Plan Note (Signed)
Chronic, stable with BP at goal today and on home readings.  Continue current medication regimen and collaboration with cardiology.  Recommend checking BP at home at least a few mornings a week + focus on DASH diet.  BMP today.  Return in 3 months.

## 2020-09-03 NOTE — Patient Instructions (Signed)
Semaglutide Oral Tablets What is this medicine? SEMAGLUTIDE (Sem a GLOO tide) controls blood sugar in people with type 2 diabetes. It is used with lifestyle changes like diet and exercise. This medicine may be used for other purposes; ask your health care provider or pharmacist if you have questions. COMMON BRAND NAME(S): Rybelsus What should I tell my health care provider before I take this medicine? They need to know if you have any of these conditions:  endocrine tumors (MEN 2) or if someone in your family had these tumors  eye disease  history of pancreatitis  kidney disease  stomach or intestine problems  thyroid cancer or if someone in your family had thyroid cancer  vision problems  an unusual or allergic reaction to semaglutide, other medicines, foods, dyes, or preservatives  pregnant or trying to get pregnant  breast-feeding How should I use this medicine? Take this medicine by mouth. Take it as directed on the prescription label at the same time every day. Take the dose right after waking up. Do not eat or drink anything before taking it. Do not take it with any other drink except a glass of plain water that is less than 4 ounces (less than 120 mL). Do not cut, crush or chew this medicine. Swallow the tablets whole. After taking it, do not eat breakfast, drink, or take any other medicines or vitamins for at least 30 minutes. Keep taking it unless your health care provider tells you to stop. A special MedGuide will be given to you by the pharmacist with each prescription and refill. Be sure to read this information carefully each time. Talk to your health care provider about the use of this medicine in children. Special care may be needed. Overdosage: If you think you have taken too much of this medicine contact a poison control center or emergency room at once. NOTE: This medicine is only for you. Do not share this medicine with others. What if I miss a dose? If you miss a  dose, skip it. Take your next dose at the normal time. Do not take extra or 2 doses at the same time to make up for the missed dose. What may interact with this medicine? What may interact with this medicine?  aminophylline  carbamazepine  cyclosporine  digoxin  levothyroxine  other medicines for diabetes  phenytoin  tacrolimus  theophylline  warfarin Many medications may cause changes in blood sugar, these include:  alcohol containing beverages  antiviral medicines for HIV or AIDS  aspirin and aspirin-like drugs  certain medicines for blood pressure, heart disease, irregular heart beat  chromium  diuretics  male hormones, such as estrogens or progestins, birth control pills  fenofibrate  gemfibrozil  isoniazid  lanreotide  male hormones or anabolic steroids  MAOIs like Carbex, Eldepryl, Marplan, Nardil, and Parnate  medicines for weight loss  medicines for allergies, asthma, cold, or cough  medicines for depression, anxiety, or psychotic disturbances  niacin  nicotine  NSAIDs, medicines for pain and inflammation, like ibuprofen or naproxen  octreotide  pasireotide  pentamidine  phenytoin  probenecid  quinolone antibiotics such as ciprofloxacin, levofloxacin, ofloxacin  some herbal dietary supplements  steroid medicines such as prednisone or cortisone  sulfamethoxazole; trimethoprim  thyroid hormones Some medications can hide the warning symptoms of low blood sugar (hypoglycemia). You may need to monitor your blood sugar more closely if you are taking one of these medications. These include:  beta-blockers, often used for high blood pressure or heart problems (  examples include atenolol, metoprolol, propranolol)  clonidine  guanethidine  reserpine This list may not describe all possible interactions. Give your health care provider a list of all the medicines, herbs, non-prescription drugs, or dietary supplements you use. Also  tell them if you smoke, drink alcohol, or use illegal drugs. Some items may interact with your medicine. What should I watch for while using this medicine? Visit your health care provider for regular checks on your progress. Check with your health care provider if you have severe diarrhea, nausea, and vomiting, or if you sweat a lot. The loss of too much body fluid may make it dangerous for you to take this medicine. A test called the HbA1C (A1C) will be monitored. This is a simple blood test. It measures your blood sugar control over the last 2 to 3 months. You will receive this test every 3 to 6 months. Learn how to check your blood sugar. Learn the symptoms of low and high blood sugar and how to manage them. Always carry a quick-source of sugar with you in case you have symptoms of low blood sugar. Examples include hard sugar candy or glucose tablets. Make sure others know that you can choke if you eat or drink when you develop serious symptoms of low blood sugar, such as seizures or unconsciousness. Get medical help at once. Tell your health care provider if you have high blood sugar. You might need to change the dose of your medicine. If you are sick or exercising more than usual, you might need to change the dose of your medicine. Do not skip meals. Ask your health care provider if you should avoid alcohol. Many nonprescription cough and cold products contain sugar or alcohol. These can affect blood sugar. Wear a medical ID bracelet or chain. Carry a card that describes your condition. List the medicines and doses you take on the card. Do not become pregnant while taking this medicine. Women should inform their health care provider if they wish to become pregnant or think they might be pregnant. There is a potential for serious side effects to an unborn child. Talk to your health care provider for more information. Do not breast-feed an infant while taking this medicine. What side effects may I  notice from receiving this medicine? Side effects that you should report to your doctor or health care provider as soon as possible:  allergic reactions (skin rash, itching or hives; swelling of the face, lips, or tongue)  changes in vision  diarrhea that continues or is severe  infection (fever, chills, cough, sore throat, pain or trouble passing urine)  kidney injury (trouble passing urine or change in the amount of urine)  low blood sugar (feeling anxious; confusion; dizziness; increased hunger; unusually weak or tired; increased sweating; shakiness; cold, clammy skin; irritable; headache; blurred vision; fast heartbeat; loss of consciousness)  lump or swelling on the neck  painful or difficulty swallowing  severe nausea  severe or unusual stomach pain  trouble breathing  vomiting Side effects that usually do not require medical attention (report these to your doctor or health care provider if they continue or are bothersome):  constipation  diarrhea  nausea  upset stomach This list may not describe all possible side effects. Call your doctor for medical advice about side effects. You may report side effects to FDA at 1-800-FDA-1088. Where should I keep my medicine? Keep out of the reach of children and pets. Store at room temperature between 20 and 25 degrees C (  68 and 77 degrees F). Keep this medicine in the original container. Protect from moisture. Keep the container tightly closed. Get rid of any unused medicine after the expiration date. To get rid of medicines that are no longer needed or have expired:  Take the medicine to a medicine take-back program. Check with your pharmacy or law enforcement to find a location.  If you cannot return the medicine, check the label or package insert to see if the medicine should be thrown out in the garbage or flushed down the toilet. If you are not sure, ask your health care provider. If it is safe to put it in the trash, take  the medicine out of the container. Mix the medicine with cat litter, dirt, coffee grounds, or other unwanted substance. Seal the mixture in a bag or container. Put it in the trash. NOTE: This sheet is a summary. It may not cover all possible information. If you have questions about this medicine, talk to your doctor, pharmacist, or health care provider.  2021 Elsevier/Gold Standard (2020-03-24 15:08:33)

## 2020-09-03 NOTE — Progress Notes (Signed)
BP 135/84 (BP Location: Left Wrist, Cuff Size: Large)   Pulse 67   Temp 97.9 F (36.6 C) (Oral)   Wt 200 lb 3.2 oz (90.8 kg)   SpO2 98%   BMI 27.15 kg/m    Subjective:    Patient ID: Ryan [name], male    DOB: 06-Jan-1951, 70 y.o.   [name]: [id number]  HPI: Ryan [name] is a 70 y.o. male  Chief Complaint  Patient presents with  . Diabetes  . Hyperlipidemia  . Hypertension   DIABETES Last A1C 7%. Takes Invokana. Had intolerance to Metformin. Hypoglycemic episodes:no Polydipsia/polyuria:no Visual disturbance:no Chest pain:no Paresthesias:no Glucose Monitoring:no Accucheck frequency: Not Checking Fasting glucose: Post prandial: Evening: Before meals: Taking Insulin?:no Long acting insulin: Short acting insulin: Blood Pressure Monitoring:daily Retinal Examination:Not Up to Date - reports eye exam in June -- [facility] Exam:Up to Date Pneumovax:took several years Influenza:refused Aspirin:no  CHRONIC KIDNEY DISEASE Last labs CRT 1.39 and GFR 51. CKD status: controlled Medications renally dose: yes Previous renal evaluation: no Pneumovax: Up To Date Influenza Vaccine:  Up to Date   HYPOTHYROIDISM Continues Levothyroxine 50 MCG and TSH 3.640. Thyroid control status:stable Satisfied with current treatment? yes Medication side effects: no Medication compliance: good compliance Etiology of hypothyroidism:  Recent dose adjustment:no Fatigue: no Cold intolerance: no Heat intolerance: no Weight gain: no Weight loss: no Constipation: no Diarrhea/loose stools: no Palpitations: no Lower extremity edema: no Anxiety/depressed mood: no  HYPERTENSION / HYPERLIPIDEMIA/HF Is followed by cardiology, Dr. Caryl Comes, last saw 02/12/20. Continues on Aldactone, Entresto, Coreg.  No current statin as reported cardiology took him off this.  Tried Pravastatin in past with poor  response, with terrible joint pain.  Has 2 years and 9 months on battery life pacemaker at this time per patient report. Satisfied with current treatment?yes Duration of hypertension:chronic BP monitoring frequency:a few times a week BP range:110/80 on average BP medication side effects:no Duration of hyperlipidemia:chronic Medication compliance:good compliance Aspirin:no Recent stressors:no Recurrent headaches:no Visual changes:no Palpitations:no Dyspnea:no Chest pain:no Lower extremity edema:no Dizzy/lightheaded:no The 10-year ASCVD risk score Mikey Bussing DC Jr., et al., 2013) is: 37.9%   Values used to calculate the score:     Age: 30 years     Sex: Male     Is Non-Hispanic African American: No     Diabetic: Yes     Tobacco smoker: No     Systolic Blood Pressure: 025 mmHg     Is BP treated: Yes     HDL Cholesterol: 33 mg/dL     Total Cholesterol: 147 mg/dL  Relevant past medical, surgical, family and social history reviewed and updated as indicated. Interim medical history since our last visit reviewed. Allergies and medications reviewed and updated.  Review of Systems  Constitutional: Negative for activity change, diaphoresis, fatigue and fever.  Respiratory: Negative for cough, chest tightness, shortness of breath and wheezing.   Cardiovascular: Negative for chest pain, palpitations and leg swelling.  Gastrointestinal: Negative.   Endocrine: Negative for polydipsia, polyphagia and polyuria.  Neurological: Negative.   Psychiatric/Behavioral: Negative.     Per HPI unless specifically indicated above     Objective:    BP 135/84 (BP Location: Left Wrist, Cuff Size: Large)   Pulse 67   Temp 97.9 F (36.6 C) (Oral)   Wt 200 lb 3.2 oz (90.8 kg)   SpO2 98%   BMI 27.15 kg/m   Wt Readings from Last 3 Encounters:  09/03/20 200 lb 3.2 oz (90.8 kg)  06/04/20 198  lb (89.8 kg)  02/28/20 196 lb (88.9 kg)    Physical Exam Vitals and nursing note reviewed.   Constitutional:      General: He is awake. He is not in acute distress.    Appearance: He is well-developed and well-groomed. He is not ill-appearing.  HENT:     Head: Normocephalic and atraumatic.     Right Ear: Hearing normal. No drainage.     Left Ear: Hearing normal. No drainage.  Eyes:     General: Lids are normal.        Right eye: No discharge.        Left eye: No discharge.     Conjunctiva/sclera: Conjunctivae normal.     Pupils: Pupils are equal, round, and reactive to light.  Neck:     Vascular: No carotid bruit.  Cardiovascular:     Rate and Rhythm: Normal rate and regular rhythm.     Heart sounds: Normal heart sounds, S1 normal and S2 normal. No murmur heard. No gallop.   Pulmonary:     Effort: Pulmonary effort is normal. No accessory muscle usage or respiratory distress.     Breath sounds: Normal breath sounds.  Abdominal:     General: Bowel sounds are normal.     Palpations: Abdomen is soft.  Musculoskeletal:        General: Normal range of motion.     Cervical back: Normal range of motion and neck supple.     Right lower leg: No edema.     Left lower leg: No edema.  Skin:    General: Skin is warm and dry.     Capillary Refill: Capillary refill takes less than 2 seconds.  Neurological:     Mental Status: He is alert and oriented to person, place, and time.  Psychiatric:        Attention and Perception: Attention normal.        Mood and Affect: Mood normal.        Speech: Speech normal.        Behavior: Behavior normal. Behavior is cooperative.        Thought Content: Thought content normal.    Results for orders placed or performed in visit on 07/30/20  CUP PACEART REMOTE DEVICE CHECK  Result Value Ref Range   Date Time Interrogation Session [id number]    Pulse Generator Manufacturer [employer]    Pulse Gen Model [place] MRI Quad CRT-D    Pulse Gen Serial Number [id number] [facility]    Implantable Pulse Generator Type Cardiac  Resynch Therapy Defibulator    Implantable Pulse Generator Implant Date 34356861    Implantable Lead Manufacturer [place] Serial Number [id number]    Implantable Lead Implant Date 68372902    Implantable Lead Location Detail 1 UNKNOWN    Implantable Lead Location [id number]    Implantable Lead Manufacturer [facility]    Implantable Lead Model [id number] Sprint Quattro Secure    Implantable Lead Serial Number [id number]    Implantable Lead Implant Date [id number]    Implantable Lead Location [id number]    Implantable Lead Manufacturer [facility]    Implantable Lead Model 5076 CapSureFix Novus    Implantable Lead Serial Number [id number]    Implantable Lead Implant Date [id number]    Implantable Lead Location [id number]    Lead Channel Setting Sensing Sensitivity 0.3 mV   Lead Channel Setting Pacing Amplitude 1.25 V  Lead Channel Setting Pacing Pulse Width 0.4 ms   Lead Channel Setting Pacing Amplitude 1.5 V   Lead Channel Setting Pacing Pulse Width 0.4 ms   Lead Channel Setting Pacing Amplitude 2 V   Lead Channel Setting Pacing Capture Mode Monitor Capture    Lead Channel Impedance Value 399 ohm   Lead Channel Sensing Intrinsic Amplitude 0.75 mV   Lead Channel Sensing Intrinsic Amplitude 0.75 mV   Lead Channel Pacing Threshold Amplitude 0.625 V   Lead Channel Pacing Threshold Pulse Width 0.4 ms   Lead Channel Impedance Value 608 ohm   Lead Channel Impedance Value 551 ohm   Lead Channel Pacing Threshold Amplitude 0.625 V   Lead Channel Pacing Threshold Pulse Width 0.4 ms   HighPow Impedance 47 ohm   HighPow Impedance 57 ohm   Lead Channel Impedance Value 931 ohm   Lead Channel Impedance Value 950 ohm   Lead Channel Impedance Value 1,007 ohm   Lead Channel Impedance Value 760 ohm   Lead Channel Impedance Value 779 ohm   Lead Channel Impedance Value 836 ohm   Lead Channel Impedance Value 608 ohm   Lead Channel Impedance Value 456 ohm   Lead Channel  Impedance Value 475 ohm   Lead Channel Impedance Value 475 ohm   Lead Channel Impedance Value 260.571    Lead Channel Impedance Value 266.667    Lead Channel Impedance Value 266.667    Lead Channel Impedance Value 232.653    Lead Channel Impedance Value 237.5 ohm   Lead Channel Pacing Threshold Amplitude 1.375 V   Lead Channel Pacing Threshold Pulse Width 0.4 ms   Battery Status OK    Battery Remaining Longevity 32 mo   Battery Voltage 2.95 V   Brady Statistic RA Percent Paced 7.49 %   Brady Statistic RV Percent Paced 99.98 %   Brady Statistic AP VP Percent 7.49 %   Brady Statistic AS VP Percent 92.5 %   Brady Statistic AP VS Percent 0.01 %   Brady Statistic AS VS Percent 0 %      Assessment & Plan:   Problem List Items Addressed This Visit      Cardiovascular and Mediastinum   Idiopathic cardiomyopathy (HCC)    Ongoing, stable, followed by cardiology.  Continue this collaboration.      Relevant Medications   dexlansoprazole (DEXILANT) 60 MG capsule   spironolactone (ALDACTONE) 25 MG tablet   sacubitril-valsartan (ENTRESTO) 24-26 MG   carvedilol (COREG) 12.5 MG tablet   Hypertension associated with diabetes ([place])    Chronic, stable with BP at goal today and on home readings.  Continue current medication regimen and collaboration with cardiology.  Recommend checking BP at home at least a few mornings a week + focus on DASH diet.  BMP today.  Return in 3 months.      Relevant Medications   spironolactone (ALDACTONE) 25 MG tablet   sacubitril-valsartan (ENTRESTO) 24-26 MG   carvedilol (COREG) 12.5 MG tablet   Semaglutide (RYBELSUS) 3 MG TABS   canagliflozin (INVOKANA) 100 MG TABS tablet   Other Relevant Orders   Bayer DCA Hb A1c Waived   Basic metabolic panel   Systolic CHF (HCC)    Chronic, stable.  Euvolemic today.  Continue current medication regimen and collaboration with cardiology.  BMP today.   Recommend: - Reminded to call for an overnight weight gain of >2  pounds or a weekly weight weight of >5 pounds - not adding salt to his food and has  been reading food labels. Reviewed the importance of keeping daily sodium intake to <205m daily       Relevant Medications   spironolactone (ALDACTONE) 25 MG tablet   sacubitril-valsartan (ENTRESTO) 24-26 MG   carvedilol (COREG) 12.5 MG tablet   Type 2 diabetes mellitus with cardiac complication (HCC) - Primary    Chronic, ongoing with HF.  A1C today 7.8% and urine ALB 30 last visit.  Continue current medication regimen with addition of Rybelsus 3 MG daily (he would prefer to avoid injectable and can not increase Invokana further due to current eGFR) + continue collaboration with cardiology.  Recommend he check BS at home at least a few mornings a week with goal fasting <130, bring to next visit.  Continue to collaborate with CCM crew.  Return to office in 4 weeks and will adjust medications further as needed.      Relevant Medications   spironolactone (ALDACTONE) 25 MG tablet   sacubitril-valsartan (ENTRESTO) 24-26 MG   carvedilol (COREG) 12.5 MG tablet   Semaglutide (RYBELSUS) 3 MG TABS   canagliflozin (INVOKANA) 100 MG TABS tablet   Other Relevant Orders   Bayer DCA Hb A1c Waived   Basic metabolic panel     Endocrine   Hyperlipidemia associated with type 2 diabetes mellitus (HCC)    Chronic, ongoing.  No current medication due to poor tolerance.   Would benefit from statin use with diabetes and heart health or consider injectable, discussed with him today.  LDL last visit was 74.  Lipid panel today.  He is agreeable to trial of Rosuvastatin on 3 day or 1 day a week schedule if LDL >70 this check, as current ASCVD 37%.      Relevant Medications   Semaglutide (RYBELSUS) 3 MG TABS   canagliflozin (INVOKANA) 100 MG TABS tablet   Other Relevant Orders   Bayer DCA Hb A1c Waived   Lipid Panel w/o Chol/HDL Ratio   Hypothyroidism    Chronic, ongoing.  Continue current medication regimen and adjust as  needed based on labs.  TSH and Free T4 up to date.      Relevant Medications   levothyroxine (SYNTHROID) 50 MCG tablet   carvedilol (COREG) 12.5 MG tablet   CKD stage 3 due to type 2 diabetes mellitus (HCC)    Ongoing, 3a.  Recheck BMP today and monitor closely.  Consider referral to nephrology if worsening.      Relevant Medications   Semaglutide (RYBELSUS) 3 MG TABS   canagliflozin (INVOKANA) 100 MG TABS tablet     Other   Myalgia due to statin    Due to statin therapy, history of on low doses.  Would benefit from statin use with diabetes and heart health or consider injectable, discussed with him today.  LDL last visit was 74.  Lipid panel today.      Gout    Refills sent in.      Relevant Medications   febuxostat (ULORIC) 40 MG tablet       Follow up plan: Return in about 4 weeks (around 10/01/2020) for T2DM.

## 2020-09-04 ENCOUNTER — Telehealth: Payer: Self-pay

## 2020-09-04 LAB — LIPID PANEL W/O CHOL/HDL RATIO
Cholesterol, Total: 140 mg/dL (ref 100–199)
HDL: 30 mg/dL — ABNORMAL LOW (ref >39)
LDL Chol Calc (NIH): 68 mg/dL (ref 0–99)
Triglycerides: 262 mg/dL — ABNORMAL HIGH (ref 0–149)
VLDL Cholesterol Cal: 42 mg/dL — ABNORMAL HIGH (ref 5–40)

## 2020-09-04 LAB — BASIC METABOLIC PANEL
BUN/Creatinine Ratio: 12 (ref 10–24)
BUN: 15 mg/dL (ref 8–27)
CO2: 21 mmol/L (ref 20–29)
Calcium: 9.5 mg/dL (ref 8.6–10.2)
Chloride: 101 mmol/L (ref 96–106)
Creatinine, Ser: 1.26 mg/dL (ref 0.76–1.27)
Glucose: 205 mg/dL — ABNORMAL HIGH (ref 65–99)
Potassium: 4.4 mmol/L (ref 3.5–5.2)
Sodium: 140 mmol/L (ref 134–144)
eGFR: 62 mL/min/{1.73_m2} (ref >59)

## 2020-09-04 NOTE — Chronic Care Management (AMB) (Signed)
    Chronic Care Management Pharmacy Assistant   Name: Ryan [name]  [name]: [id number] DOB: 09-20-1950   Reason for Encounter: Patient Assistance Application     Medications: Outpatient Encounter Medications as of 09/04/2020  Medication Sig  . canagliflozin (INVOKANA) 100 MG TABS tablet Take 1 tablet (100 mg total) by mouth daily before breakfast.  . carvedilol (COREG) 12.5 MG tablet Take 1 tablet (12.5 mg total) by mouth 2 (two) times daily.  . Cholecalciferol (VITAMIN D3 SUPER STRENGTH) 50 MCG (2000 UT) CAPS Take 2,000 Units by mouth daily.  Marland Kitchen dexlansoprazole (DEXILANT) 60 MG capsule Take 1 capsule (60 mg total) by mouth daily.  . febuxostat (ULORIC) 40 MG tablet Take 1 tablet (40 mg total) by mouth daily.  Marland Kitchen levothyroxine (SYNTHROID) 50 MCG tablet Take 1 tablet (50 mcg total) by mouth daily.  . mupirocin ointment (BACTROBAN) 2 % Apply 1 application topically daily. With dressing changes  . sacubitril-valsartan (ENTRESTO) 24-26 MG Take 1 tablet by mouth 2 (two) times daily.  . sitaGLIPtin (JANUVIA) 100 MG tablet Take 1 tablet (100 mg total) by mouth daily.  Marland Kitchen spironolactone (ALDACTONE) 25 MG tablet Take 1 tablet (25 mg total) by mouth daily.   No facility-administered encounter medications on file as of 09/04/2020.    Completed Patient Assistanc e Application for Rybelsus. Sent to application to [employer], CPP.    Ryan [name] Clinical Pharmacist Assistant [phone]

## 2020-09-04 NOTE — Chronic Care Management (AMB) (Signed)
Completed new Invokana patient assistance application. Sent to Birdena Crandall, [place] Clinical Pharmacist Assistant [phone]

## 2020-09-04 NOTE — Progress Notes (Signed)
Contacted via [place] morning Jefrey, your labs have returned!!  I am excited to see we are going to work on getting Rybelsus on board with assist, I spoke to [place] about this!!  Glucose was a bit high on labs this check at 205.  Kidney function is improved this check at 1.26 creatinine and eGFR 62.  Great news!!  Cholesterol levels continue to show some elevation in triglycerides -- continue taking fish oil daily.  Your LDL is at goal of <70.  Any questions?  Hope today is amazing!! Keep being awesome!!  Thank you for allowing me to participate in your care. Kindest regards, Sirena Riddle

## 2020-09-05 ENCOUNTER — Telehealth: Payer: Self-pay | Admitting: Pharmacist

## 2020-09-05 NOTE — Telephone Encounter (Signed)
Will sign them now

## 2020-09-05 NOTE — Telephone Encounter (Signed)
Patient assistance applications signed, completed and faxed to [employer] today. Will ask CPA to follow up in 10-14 days.

## 2020-09-05 NOTE — Telephone Encounter (Signed)
Thank you! Forms completed and faxed to [employer] and [employer].

## 2020-09-05 NOTE — Telephone Encounter (Signed)
Placed applications in your folder for signature. Mr. Ryan [name] is coming by to sign and provide proof of income today. Thank you:)

## 2020-09-12 ENCOUNTER — Telehealth: Payer: Self-pay | Admitting: Pharmacist

## 2020-09-12 NOTE — Chronic Care Management (AMB) (Addendum)
Chronic Care Management Pharmacy Assistant   Name: Ryan [name]  [name]: [id number] DOB: 1951/01/22  Reason for Encounter: Disease State-DM    Recent office visits:  09/03/20- Ryan Guarneri, NP(PCP)  Recent consult visits:  None noted  Hospital visits:  None in previous 6 months  Medications: Outpatient Encounter Medications as of 09/12/2020  Medication Sig   canagliflozin (INVOKANA) 100 MG TABS tablet Take 1 tablet (100 mg total) by mouth daily before breakfast.   carvedilol (COREG) 12.5 MG tablet Take 1 tablet (12.5 mg total) by mouth 2 (two) times daily.   Cholecalciferol (VITAMIN D3 SUPER STRENGTH) 50 MCG (2000 UT) CAPS Take 2,000 Units by mouth daily.   dexlansoprazole (DEXILANT) 60 MG capsule Take 1 capsule (60 mg total) by mouth daily.   febuxostat (ULORIC) 40 MG tablet Take 1 tablet (40 mg total) by mouth daily.   levothyroxine (SYNTHROID) 50 MCG tablet Take 1 tablet (50 mcg total) by mouth daily.   mupirocin ointment (BACTROBAN) 2 % Apply 1 application topically daily. With dressing changes   sacubitril-valsartan (ENTRESTO) 24-26 MG Take 1 tablet by mouth 2 (two) times daily.   sitaGLIPtin (JANUVIA) 100 MG tablet Take 1 tablet (100 mg total) by mouth daily.   spironolactone (ALDACTONE) 25 MG tablet Take 1 tablet (25 mg total) by mouth daily.   No facility-administered encounter medications on file as of 09/12/2020.   Recent Relevant Labs: Lab Results  Component Value Date/Time   HGBA1C 7.8 (H) 09/03/2020 09:33 AM   HGBA1C 7.0 (H) 06/04/2020 09:35 AM   HGBA1C 6.9 11/08/2017 12:00 AM   HGBA1C 6.9 11/08/2017 12:00 AM   MICROALBUR 10 06/04/2020 09:35 AM   MICROALBUR 10 05/29/2019 09:16 AM    Kidney Function Lab Results  Component Value Date/Time   CREATININE 1.26 09/03/2020 10:36 AM   CREATININE 1.39 (H) 06/04/2020 09:37 AM   GFRNONAA 51 (L) 06/04/2020 09:37 AM   GFRAA 59 (L) 06/04/2020 09:37 AM    Current antihyperglycemic regimen:  Invokana 100 mg  daily Januvia 100 mg daily  What recent interventions/DTPs have been made to improve glycemic control:  None noted  Have there been any recent hospitalizations or ED visits since last visit with CPP? No   Patient denies hypoglycemic symptoms, including Pale, Sweaty, Shaky, Hungry, Nervous/irritable and Vision changes   Patient denies hyperglycemic symptoms, including blurry vision, excessive thirst, fatigue, polyuria and weakness   How often are you checking your blood sugar? 2-3 times week  What are your blood sugars ranging?  Fasting: 240 Before meals:  After meals:  Bedtime:   During the week, how often does your blood glucose drop below 70? Never   Are you checking your feet daily/regularly? Patient states he checks his feet daily.   Adherence Review: Is the patient currently on a STATIN medication? No Is the patient currently on ACE/ARB medication? Yes Does the patient have >5 day gap between last estimated fill dates? Yes   Called [employer]regarding patients Rybelsus patient assistance application. Patient is approved 09/12/20-05/09/21. New rx will need to be sent for 7 mg 120 DS. Patient will receive 3 mg for 30 DS and 7 mg for 30 DS.   [place] Patient Assistance Program regarding [place] assistance application. Patient is denied. Patient will need to spend 4% out of pocket prior to approval. Out of pocket to be met will need to be $2132. [employer] did not receive pharmacy recipes or EOBs.  Patient notified and states  he will have [facility] bring his out of pocket expenses to [facility] to fax it to [employer].  Star [facility] Drugs: Invokana 100 mg  Januvia 100 mg Entresto 24-26 mg last filled 07/10/20 30 DS  [facility] Clinical Pharmacist Assistant [phone]  Addendum: [facility] 09/16/20 13:37. Will discuss changing Invokana to [place] with PCP and patient. [place] does not have out of pocket  requirements and also provides a one month free voucher.  I have reviewed the care management and care coordination activities outlined in this encounter and I am certifying that I agree with the content of this note.  Junita Push. Kenton Kingfisher PharmD, [place] [facility] [phone]

## 2020-09-15 ENCOUNTER — Telehealth: Payer: Self-pay | Admitting: Pharmacist

## 2020-09-15 NOTE — Chronic Care Management (AMB) (Signed)
    Chronic Care Management Pharmacy Assistant   Name: Ryan [name]  [name]: [id number] DOB: 1950-06-28  Reason for Encounter: Rybelsus Patient Assistance  Medications: Outpatient Encounter Medications as of 09/15/2020  Medication Sig  . canagliflozin (INVOKANA) 100 MG TABS tablet Take 1 tablet (100 mg total) by mouth daily before breakfast.  . carvedilol (COREG) 12.5 MG tablet Take 1 tablet (12.5 mg total) by mouth 2 (two) times daily.  . Cholecalciferol (VITAMIN D3 SUPER STRENGTH) 50 MCG (2000 UT) CAPS Take 2,000 Units by mouth daily.  Marland Kitchen dexlansoprazole (DEXILANT) 60 MG capsule Take 1 capsule (60 mg total) by mouth daily.  . febuxostat (ULORIC) 40 MG tablet Take 1 tablet (40 mg total) by mouth daily.  Marland Kitchen levothyroxine (SYNTHROID) 50 MCG tablet Take 1 tablet (50 mcg total) by mouth daily.  . mupirocin ointment (BACTROBAN) 2 % Apply 1 application topically daily. With dressing changes  . sacubitril-valsartan (ENTRESTO) 24-26 MG Take 1 tablet by mouth 2 (two) times daily.  . sitaGLIPtin (JANUVIA) 100 MG tablet Take 1 tablet (100 mg total) by mouth daily.  Marland Kitchen spironolactone (ALDACTONE) 25 MG tablet Take 1 tablet (25 mg total) by mouth daily.   No facility-administered encounter medications on file as of 09/15/2020.   Patient called with concerns with rybelsus patient assistance. He previously was told that he was accepted to the program, but recently received a letter stating his enrollment was denied. I spoke with Shondell at [employer] and confirmed that Mr. Fare is enrolled with the patient assistance program until the end of the year. She stated Mr. Mendizabal medications will be sent to the office between 10-14DS Mr. Siebenaler is aware that his enrollment is confirmed and he will be receiving his medication in a few weeks from his PCP.   Wilford Sports CPA, CMA

## 2020-09-16 ENCOUNTER — Other Ambulatory Visit: Payer: Self-pay | Admitting: Nurse Practitioner

## 2020-09-16 ENCOUNTER — Telehealth: Payer: Self-pay | Admitting: Pharmacist

## 2020-09-16 MED ORDER — DAPAGLIFLOZIN PROPANEDIOL 10 MG PO TABS: 10.0000 mg | ORAL_TABLET | Freq: Every day | ORAL | 4 refills | Status: DC

## 2020-09-16 NOTE — Chronic Care Management (AMB) (Signed)
Completed application for Farxiga patient assistance. Mailed application to patient.  Lizbeth Bark Clinical Pharmacist Assistant [phone]

## 2020-09-16 NOTE — Telephone Encounter (Signed)
Collaborated with PCP to switch patient from [place] to [place] as Invokana requires patient to spend >$2000 out of pocket for approval. [place] has no out of pocket requirements this year. Patient in in agreement. He recently filled a 90 day supply of Invokana and will continue taking until gone. CPA will mail [place] application to patient per his request.

## 2020-09-16 NOTE — Telephone Encounter (Signed)
Fantastic, thank you!

## 2020-09-29 ENCOUNTER — Telehealth: Payer: Self-pay | Admitting: Pharmacist

## 2020-09-29 MED ORDER — RYBELSUS 3 MG PO TABS: 3.0000 mg | ORAL_TABLET | Freq: Every day | ORAL | 12 refills | Status: DC

## 2020-09-29 NOTE — Addendum Note (Signed)
Addended by: [name] T on: 09/29/2020 05:18 PM   Modules accepted: Orders

## 2020-09-29 NOTE — Telephone Encounter (Signed)
Thanks [name]! Patient notiifed. Would you send 30 day prescription of Rybelsus to [facility]? I have a voucher for a free month for Mr. Dahan while his patient assistance shipment is being located.

## 2020-09-29 NOTE — Telephone Encounter (Signed)
Received message from patient requesting call back.  Patient reports he has not started new medications (Farxiga or Rybelsus) as patient assistance applications are still in processing. Mr. Pardo would like to know if he needs to be seen as scheduled on 10/01/20 or if he can reschedule after he starts new medications. He reports checking his blood glucose every morning and provided the following readings from his log: 157,177,181,267,217,157,160,171,178 and 180. Mr. Hankerson is currently taking Invokana 300mg  qd supply on hand.   [employer] and spoke with representative, Roderic Ovens, she confirmed his approval and enrollment through 04/08/21 for Rybelsus. However, [employer] has PCP's address listed as [address] and package has shipped. Will contact [facility] clinic to see if maybe they received erroneously.

## 2020-09-29 NOTE — Telephone Encounter (Signed)
Sent this in

## 2020-09-30 NOTE — Telephone Encounter (Signed)
Thank you. I dropped off voucher at [place] yesterday.

## 2020-10-01 ENCOUNTER — Ambulatory Visit: Payer: Medicare Other | Admitting: Nurse Practitioner

## 2020-10-03 ENCOUNTER — Telehealth: Payer: Self-pay

## 2020-10-03 NOTE — Telephone Encounter (Signed)
1 box of 3 mg Rybelsus and 1 box of 7 mg Rybelsus received for the patient. Called and notified him that these were ready to be picked up.

## 2020-10-03 NOTE — Telephone Encounter (Signed)
Copied from [place] [id number]. Topic: General - Other >> Oct 03, 2020 12:31 PM Tessa Lerner A wrote: Reason for CRM: Alford Highland with [facility] has called to notify Almyra Free that medication for the patient was received by their office today 10/03/20  [name] has called to address additional concerns Almyra Free shared previously and will be available to discuss via phone anytime after 1:30 PM  Please contact to further advsie when possible

## 2020-10-03 NOTE — Telephone Encounter (Signed)
Medication picked up by the patient's wife.

## 2020-10-09 NOTE — Telephone Encounter (Signed)
I spoke with [name] at [facility]. Rybelsus patient assistance medications for Ryan [name] were shipped erroneously to the clinic.  Address has been updated to [facility] per [employer] representative and patient has already picked up replacement shipment at our office.

## 2020-10-09 NOTE — Telephone Encounter (Signed)
[name] from the med management clinic requested a call back from [place] regarding some medications for the pt, Please call back.

## 2020-10-09 NOTE — Telephone Encounter (Signed)
Thank you. Did she leave a number by chance?

## 2020-10-09 NOTE — Telephone Encounter (Signed)
[facility] took this call they did not leave a number

## 2020-10-29 ENCOUNTER — Ambulatory Visit (INDEPENDENT_AMBULATORY_CARE_PROVIDER_SITE_OTHER): Payer: Medicare Other

## 2020-10-29 DIAGNOSIS — I428 Other cardiomyopathies: Secondary | ICD-10-CM

## 2020-10-29 LAB — CUP PACEART REMOTE DEVICE CHECK
Battery Remaining Longevity: 31 mo
Battery Voltage: 2.95 V
Brady Statistic AP VP Percent: 6.11 %
Brady Statistic AP VS Percent: 0.01 %
Brady Statistic AS VP Percent: 93.88 %
Brady Statistic AS VS Percent: 0 %
Brady Statistic RA Percent Paced: 6.12 %
Brady Statistic RV Percent Paced: 99.98 %
Date Time Interrogation Session: 20220622012404
HighPow Impedance: 48 Ohm
HighPow Impedance: 61 Ohm
Implantable Lead Implant Date: 20060505
Implantable Lead Implant Date: 20120111
Implantable Lead Implant Date: 20180314
Implantable Lead Location: 753858
Implantable Lead Location: 753859
Implantable Lead Location: 753860
Implantable Lead Model: 5076
Implantable Lead Model: 6947
Implantable Pulse Generator Implant Date: 20180314
Lead Channel Impedance Value: 241.412
Lead Channel Impedance Value: 241.412
Lead Channel Impedance Value: 241.412
Lead Channel Impedance Value: 241.412
Lead Channel Impedance Value: 256.5 Ohm
Lead Channel Impedance Value: 418 Ohm
Lead Channel Impedance Value: 456 Ohm
Lead Channel Impedance Value: 456 Ohm
Lead Channel Impedance Value: 513 Ohm
Lead Channel Impedance Value: 513 Ohm
Lead Channel Impedance Value: 513 Ohm
Lead Channel Impedance Value: 589 Ohm
Lead Channel Impedance Value: 760 Ohm
Lead Channel Impedance Value: 817 Ohm
Lead Channel Impedance Value: 817 Ohm
Lead Channel Impedance Value: 817 Ohm
Lead Channel Impedance Value: 836 Ohm
Lead Channel Impedance Value: 874 Ohm
Lead Channel Pacing Threshold Amplitude: 0.625 V
Lead Channel Pacing Threshold Amplitude: 0.625 V
Lead Channel Pacing Threshold Amplitude: 1 V
Lead Channel Pacing Threshold Pulse Width: 0.4 ms
Lead Channel Pacing Threshold Pulse Width: 0.4 ms
Lead Channel Pacing Threshold Pulse Width: 0.4 ms
Lead Channel Sensing Intrinsic Amplitude: 1 mV
Lead Channel Sensing Intrinsic Amplitude: 1 mV
Lead Channel Setting Pacing Amplitude: 1.25 V
Lead Channel Setting Pacing Amplitude: 1.5 V
Lead Channel Setting Pacing Amplitude: 2 V
Lead Channel Setting Pacing Pulse Width: 0.4 ms
Lead Channel Setting Pacing Pulse Width: 0.4 ms
Lead Channel Setting Sensing Sensitivity: 0.3 mV

## 2020-11-05 ENCOUNTER — Telehealth: Payer: Self-pay | Admitting: Pharmacist

## 2020-11-05 NOTE — Chronic Care Management (AMB) (Signed)
    Chronic Care Management Pharmacy Assistant   Name: Ryan [name]  [name]: [id number] DOB: Aug 01, 1950   Reason for Encounter: Chart Review   Medications: Outpatient Encounter Medications as of 11/05/2020  Medication Sig   carvedilol (COREG) 12.5 MG tablet Take 1 tablet (12.5 mg total) by mouth 2 (two) times daily.   Cholecalciferol (VITAMIN D3 SUPER STRENGTH) 50 MCG (2000 UT) CAPS Take 2,000 Units by mouth daily.   dapagliflozin propanediol (FARXIGA) 10 MG TABS tablet Take 1 tablet (10 mg total) by mouth daily before breakfast.   dexlansoprazole (DEXILANT) 60 MG capsule Take 1 capsule (60 mg total) by mouth daily.   febuxostat (ULORIC) 40 MG tablet Take 1 tablet (40 mg total) by mouth daily.   levothyroxine (SYNTHROID) 50 MCG tablet Take 1 tablet (50 mcg total) by mouth daily.   mupirocin ointment (BACTROBAN) 2 % Apply 1 application topically daily. With dressing changes   sacubitril-valsartan (ENTRESTO) 24-26 MG Take 1 tablet by mouth 2 (two) times daily.   Semaglutide (RYBELSUS) 3 MG TABS Take 3 mg by mouth daily.   sitaGLIPtin (JANUVIA) 100 MG tablet Take 1 tablet (100 mg total) by mouth daily.   spironolactone (ALDACTONE) 25 MG tablet Take 1 tablet (25 mg total) by mouth daily.   No facility-administered encounter medications on file as of 11/05/2020.    Reviewed chart for medication changes and adherence.  No OVs, Consults, or hospital visits since last care coordination call / Pharmacist visit. No medication changes indicated  No gaps in adherence identified. Patient has follow up scheduled with pharmacy team. No further action required.   Corrie Mckusick, [place]

## 2020-11-12 ENCOUNTER — Telehealth: Payer: PRIVATE HEALTH INSURANCE

## 2020-11-18 NOTE — Progress Notes (Signed)
Remote ICD transmission.   

## 2020-12-02 ENCOUNTER — Telehealth: Payer: PRIVATE HEALTH INSURANCE

## 2020-12-02 ENCOUNTER — Telehealth: Payer: Medicare Other | Admitting: General Practice

## 2020-12-02 ENCOUNTER — Ambulatory Visit (INDEPENDENT_AMBULATORY_CARE_PROVIDER_SITE_OTHER): Payer: Medicare Other | Admitting: General Practice

## 2020-12-02 DIAGNOSIS — E1169 Type 2 diabetes mellitus with other specified complication: Secondary | ICD-10-CM

## 2020-12-02 DIAGNOSIS — I5022 Chronic systolic (congestive) heart failure: Secondary | ICD-10-CM | POA: Diagnosis not present

## 2020-12-02 DIAGNOSIS — E785 Hyperlipidemia, unspecified: Secondary | ICD-10-CM | POA: Diagnosis not present

## 2020-12-02 DIAGNOSIS — E1159 Type 2 diabetes mellitus with other circulatory complications: Secondary | ICD-10-CM

## 2020-12-02 DIAGNOSIS — I152 Hypertension secondary to endocrine disorders: Secondary | ICD-10-CM | POA: Diagnosis not present

## 2020-12-02 NOTE — Progress Notes (Signed)
Pt scheduled for 01/09/2021 per pt this date.

## 2020-12-02 NOTE — Patient Instructions (Signed)
Visit Information  PATIENT GOALS:  Goals Addressed             This Visit's Progress    RNCM: Obtain Eye Exam-Diabetes Type 2          - schedule appointment with eye doctor    The patient states today that he is going to try and have an eye exam before his next visit with the pcp on 06-04-2020 at 10 am. 12-02-2020: Patient is compliant with eye exam. Will continue to monitor for changes.         Patient verbalizes understanding of instructions provided today and agrees to view in [place].   Telephone follow up appointment with care management team member scheduled for: 02-04-2021 at 98 am  Noreene Larsson RN, MSN, [facility] Mobile: [phone]

## 2020-12-02 NOTE — Chronic Care Management (AMB) (Signed)
Chronic Care Management   CCM RN Visit Note  12/02/2020 Name: Ryan [name] [name]: [id number] DOB: [birth year]  Subjective: Ryan [name] is a 70 y.o. year old male who is a primary care patient of Cannady, Barbaraann Faster, NP. The care management team was consulted for assistance with disease management and care coordination needs.    Engaged with patient by telephone for follow up visit in response to provider referral for case management and/or care coordination services.   Consent to Services:  The patient was given information about Chronic Care Management services, agreed to services, and gave verbal consent prior to initiation of services.  Please see initial visit note for detailed documentation.   Patient agreed to services and verbal consent obtained.   Assessment: Review of patient past medical history, allergies, medications, health status, including review of consultants reports, laboratory and other test data, was performed as part of comprehensive evaluation and provision of chronic care management services.   SDOH (Social Determinants of Health) assessments and interventions performed:    CCM Care Plan  Allergies  Allergen Reactions   Ace Inhibitors     Muscle aches    Crestor [Rosuvastatin Calcium] Other (See Comments)    Muscle aches    Outpatient Encounter Medications as of 12/02/2020  Medication Sig   carvedilol (COREG) 12.5 MG tablet Take 1 tablet (12.5 mg total) by mouth 2 (two) times daily.   Cholecalciferol (VITAMIN D3 SUPER STRENGTH) 50 MCG (2000 UT) CAPS Take 2,000 Units by mouth daily.   dapagliflozin propanediol (FARXIGA) 10 MG TABS tablet Take 1 tablet (10 mg total) by mouth daily before breakfast.   dexlansoprazole (DEXILANT) 60 MG capsule Take 1 capsule (60 mg total) by mouth daily.   febuxostat (ULORIC) 40 MG tablet Take 1 tablet (40 mg total) by mouth daily.   levothyroxine (SYNTHROID) 50 MCG tablet Take 1 tablet (50 mcg total) by mouth daily.   mupirocin  ointment (BACTROBAN) 2 % Apply 1 application topically daily. With dressing changes   sacubitril-valsartan (ENTRESTO) 24-26 MG Take 1 tablet by mouth 2 (two) times daily.   Semaglutide (RYBELSUS) 3 MG TABS Take 3 mg by mouth daily.   sitaGLIPtin (JANUVIA) 100 MG tablet Take 1 tablet (100 mg total) by mouth daily. (Patient not taking: Reported on 12/02/2020)   spironolactone (ALDACTONE) 25 MG tablet Take 1 tablet (25 mg total) by mouth daily.   No facility-administered encounter medications on file as of 12/02/2020.    Patient Active Problem List   Diagnosis Date Noted   CKD stage 3 due to type 2 diabetes mellitus ([place]) 08/30/2019   Type 2 diabetes mellitus with cardiac complication ([place]) 77/41/2878   Hypothyroidism 05/26/2019   Advanced care planning/counseling discussion 67/67/2094   Systolic CHF ([place]) 70/96/2836   BPH (benign prostatic hyperplasia) 04/14/2016   Hypertension associated with diabetes ([place]) 12/16/2014   Gout 12/16/2014   Hyperlipidemia associated with type 2 diabetes mellitus ([place]) 06/13/2012   Biventricular implantable cardioverter-defibrillator in situ 09/28/2010   Idiopathic cardiomyopathy ([place]) 01/27/2009   Myalgia due to statin 01/27/2009    Conditions to be addressed/monitored:CHF, HTN, HLD, and DMII  Care Plan : RNCM: Heart Failure (Adult)  Updates made by Vanita Ingles since 12/02/2020 12:00 AM  Completed 12/02/2020   Problem: Symptom Exacerbation (Heart Failure) Resolved 12/02/2020     Long-Range Goal: RNCM: HF Symptom Exacerbation Prevented or Minimized Completed 12/02/2020  Start Date: 04/15/2020  Expected End Date: 08/05/2021  This Visit's Progress: On track  Priority: Medium  Note:   Current Barriers: Closing this goal. Patient HF is stable. Knowledge deficits related to basic heart failure pathophysiology and self care management Does not adhere to provider recommendations re: weighing daily Lack of scale in home- has scales but does not weigh  daily Financial strain Very active lifestyle, patient states- "I am busy all of the time" Nurse Case Manager Clinical Goal(s):  Over the next 240 days, patient will weigh self daily and record- ask patient to be mindful of weight shifts and call for changes in weight Over the next 240 days, patient will verbalize understanding of Heart Failure Action Plan and when to call doctor Over the next 90 days, patient will take all Heart Failure mediations as prescribed Interventions:  Basic overview and discussion of pathophysiology of Heart Failure Provided written and verbal education on low sodium diet Reviewed Heart Failure Action Plan in depth and provided written copy Assessed for scales in home- has scales- last weight today 09-03-2020 is 200.  Discussed importance of daily weight- review with the patient. The patient verbalized he does not weigh daily but does not have issues with fluid shifts. Will monitor for changes. Encouraged the patient to call for weight gain of 3 pounds in one day or 5 pounds in one week. 09-03-2020: The patient states that he and his wife just got back from the beach and he is upset with himself about his eating habits. He is euvolemic today but knows he needs to work on his diet and make some changes. Empathetic listening and support given.  Reviewed role of diuretics in prevention of fluid overload - barriers to lifestyle changes reviewed and addressed - barriers to treatment reviewed and addressed - cognitive screening completed and reviewed - health literacy screening completed or reviewed - medication-adherence assessment completed - self-awareness of signs/symptoms of worsening disease encouraged  Patient Goals/Self-Care Activities Over the next 90 days, patient will:  - Take Heart Failure Medications as prescribed - Weigh daily and record (notify MD with 3 lb weight gain over night or 5 lb in a week)The patient states he does not weigh daily but he is mindful of  any weight gain or changes  - Follow CHF Action Plan - Adhere to low sodium diet Follow Up Plan: Telephone follow up appointment with care management team member scheduled for: 11-12-2020 at 0900 am     Task: RNCM: Identify and Minimize Risk of Heart Failure Exacerbation Completed 12/02/2020  Outcome: Positive  Note:   Care Management Activities:    - barriers to lifestyle changes reviewed and addressed - barriers to treatment reviewed and addressed - cognitive screening completed and reviewed - health literacy screening completed or reviewed - medication-adherence assessment completed - self-awareness of signs/symptoms of worsening disease encouraged       Care Plan : RNCM: Diabetes Type 2 (Adult)  Updates made by Vanita Ingles since 12/02/2020 12:00 AM     Problem: Glycemic Management (Diabetes, Type 2)      Long-Range Goal: RNCM: DM care plan for patient with last hemoglobin A1C of 7.8 on 09-03-2020   Start Date: 04/15/2020  Expected End Date: 09/29/2021  This Visit's Progress: On track  Priority: High  Note:   Objective:  Lab Results  Component Value Date   HGBA1C 7.8 (H) 09/03/2020    In the office at today's visit 09-03-2020: HGBA1C was 7.8% Lab Results  Component Value Date   CREATININE 1.35 (H) 02/28/2020   CREATININE 1.37 (H) 11/28/2019   CREATININE 1.42 (  H) 08/29/2019   No results found for: EGFR Current Barriers:  Knowledge Deficits related to basic Diabetes pathophysiology and self care/management Knowledge Deficits related to medications used for management of diabetes Difficulty obtaining or cannot afford medications Does not use cbg meter  Unable to independently manage DM as evidence of hemoglobin A1C of 7.8% (09-03-2020)  Case Manager Clinical Goal(s):  Over the next 120 days, patient will demonstrate improved adherence to prescribed treatment plan for diabetes self care/management as evidenced by: monitoring blood sugar per provider recommendations,  adhering to a heart healthy/ADA diet and maintaining hemoglobin A1C of 7.0 or less daily monitoring and recording of CBG- the patient does not check blood sugars daily. Education and support. Will collaborate with pcp to see what expectations of checking blood sugars are adherence to ADA/ carb modified diet- 09-03-2020: Extensive education given to the patient today about the need to monitor dietary intake due to steady incline in hemoglobin A1C. The patient is upset with himself for poor dietary choices. States he and his wife just got back from the beach. He wants to do better. Empathetic listening and support.  exercise 4/5 days/week adherence to prescribed medication regimen.   Interventions:  Provided education to patient about basic DM disease process. 12-02-2020: Reviewed with the patient the importance of checking blood sugars regularly and writing down. Encouraged the patient to bring readings to the pcp appointment for review and recommendations. Praised the patient for consistently taking his blood sugars. Ask the patient to continue to check blood sugars on a consistent basis and ask pcp recommendations on how often he should check his blood sugars.  -Reviewed medications with patient and discussed importance of medication adherence. 09-03-2020: Will collaborate with pcp and pharm D on the patient not being able to afford the Rybelsus. 12-02-2020: Patient states that he is compliant with his medications and taking as directed.  Discussed plans with patient for ongoing care management follow up and provided patient with direct contact information for care management team Provided patient with written educational materials related to hypo and hyperglycemia and importance of correct treatment. Discussed the importance of recognition of hypo and hyperglycemia and what sx and sx to look for. The patient does not check blood sugars. He denies any episodes of hypo and hyperglycemia.  Will continue to  monitor and educate accordingly. 12-02-2020: The patient states that he has not had any lows. Gave several readings. 129-217 range.  Reviewed scheduled/upcoming provider appointments including:  No upcoming appointments. Will call and get an appointment for follow up.  Review of patient status, including review of consultants reports, relevant laboratory and other test results, and medications completed. - barriers to adherence to treatment plan  am identified - blood glucose monitoring encouraged at  - mutual A1C goal set or reviewed- 09-03-2020: Goal of hemoglobin A1C is 7 - resources required to improve adherence to care identified. 09-03-2020: New medication started today Rybelsus. The patient states he went to the pharmacy after his visit and after insurance his cost is $700.00. The patient states he can not afford this and will need other options. He told the pcp this. Working with the pharm D to see if there is financial assistance for the patient.  Will continue to monitor. 12-02-2020: The patient is happy that he got his medications and is taking his medications as directed.  - self-awareness of signs/symptoms of hypo or hyperglycemia encouraged-- Review of sx and sx to monitor for with hypo  and hyperglycemia. 12-02-2020: the patient  states he has not had any lows. Recommended if he had any sx and sx of low blood sugar to check his blood sugars for a reference point. Agrees to this.  Reviewed of goal of hemoglobin A1C of 7.0 or less and that the average blood sugar to achieve a 7.0 is 154. The patient is confident that his A1C will be less than 7.0 on next MD visit. Praised the patient for progress and changes in DM management. Will continue to monitor.  -Patient Goals/Self-Care Activities Over the next 120 days, patient will:  - UNABLE to independently manage DM as evidence of hemoglobin A1C elevated Checks blood sugars as prescribed and utilize hyper and hypoglycemia protocol as needed Adheres to  prescribed ADA/carb modified Follow Up Plan: Telephone follow up appointment with care management team member scheduled for:02-04-2021 at 64 am     Task: RNCM: Alleviate Barriers to Glycemic Management Completed 12/02/2020  Outcome: Positive  Note:   Care Management Activities:    - barriers to adherence to treatment plan identified - blood glucose monitoring encouraged - mutual A1C goal set or reviewed - resources required to improve adherence to care identified - self-awareness of signs/symptoms of hypo or hyperglycemia encouraged       Care Plan : RNCM: Hypertension (Adult) and HLD  Updates made by Vanita Ingles since 12/02/2020 12:00 AM     Problem: Hypertension (Hypertension)      Long-Range Goal: RNCM: Management of Hypertension/HLD   Start Date: 09/03/2020  Expected End Date: 10/05/2021  This Visit's Progress: On track  Priority: Medium  Note:   BP Readings from Last 3 Encounters:  09/03/20 135/84  06/04/20 132/85  02/28/20 132/84    Lab Results  Component Value Date   CHOL 147 06/04/2020   CHOL 150 02/28/2020   CHOL 156 11/28/2019   Lab Results  Component Value Date   HDL 33 (L) 06/04/2020   HDL 33 (L) 02/28/2020   HDL 35 (L) 11/28/2019   Lab Results  Component Value Date   LDLCALC 74 06/04/2020   LDLCALC 86 02/28/2020   LDLCALC 82 11/28/2019   Lab Results  Component Value Date   TRIG 242 (H) 06/04/2020   TRIG 180 (H) 02/28/2020   TRIG 232 (H) 11/28/2019   Lab Results  Component Value Date   CHOLHDL 4.0 05/24/2018   CHOLHDL 4.5 04/14/2016   No results found for: LDLDIRECT  Current Barriers:  Chronic Disease Management support and education needs related to HTN and HLD  Nurse Case Manager Clinical Goal(s):  Over the next 120 days, patient will verbalize understanding of plan for HTN and HLD Over the next 120 days, patient will meet with RN Care Manager to address barriers to managing HTN and HLD Over the next 120 days, patient will attend all  scheduled medical appointments: next appointment with pcp 10-01-2020 at 0840 am Over the next 120 days, patient will demonstrate improved adherence to prescribed treatment plan for HTN and HLD as evidenced bynormal range  Interventions:  Inter-disciplinary care team collaboration (see longitudinal plan of care) Evaluation of current treatment plan related to HTN and HLD and patient's adherence to plan as established by provider. 09-03-2020: The patient saw pcp today. Labwork done. The pcp wants the patient to start Rosuvastatin 3 mg daily due to intolerance of statins in the past. The patient agrees to this. The patient is hopeful that with dietary changes his Triglycerides are better; however is willing to try the medications. 12-02-2020: The patient is  doing well with management of his HTN and HLD. His blood pressure reading today is 132/75 and HR 69. The patient is taking his blood pressure on a consistent basis. States that he is monitoring what he eats and being health minded. Will continue to monitor.  Advised patient to call for changes in blood pressure or sx/sx of worsening conditions. 12-02-2020: Advised the patient of increase risk of heart attack and stroke with elevated cholesterol levels and blood pressure. Provided education to patient re: sx and sx of HTN, following a heart healthy diet, adequate exercise. 09-03-2020: Review and education given on dietary restrictions. Will send educational material by my chart system on diabetic and DASH diets. 12-02-2020: Review of heart health and will continue to work with the patient for changes in his condition. Denies any acute concerns at this time.  Reviewed medications with patient and discussed compliance. 09-03-2020: The patient is compliant with medications. Low tolerance in the past to statins but is willing to try low dose of Rosuvastatin. 12-02-2020: States compliance with medications, no new needs at this time.  Discussed plans with patient for  ongoing care management follow up and provided patient with direct contact information for care management team Reviewed scheduled/upcoming provider appointments including: No upcoming appointments will continue to monitor. Knows to call the office to schedule a follow up.   Patient Self Care Activities:  Patient will self administer medications as prescribed Patient will attend all scheduled provider appointments Patient will call pharmacy for medication refills Patient will call provider office for new concerns or questions Unable to independently manage HTN and HLD as evidence of unable to take statins to manage HLD  Follow up with the patient on 02-04-2021 at 1030 am    Task: Identify and Monitor Blood Pressure Elevation Completed 12/02/2020  Outcome: Positive  Note:   Care Management Activities:    - blood pressure trends reviewed - depression screen reviewed - home or ambulatory blood pressure monitoring encouraged    Notes:      Plan:Telephone follow up appointment with care management team member scheduled for:  02-04-2021 at 5 am  [place], MSN, [facility] Mobile: [phone]

## 2020-12-29 ENCOUNTER — Telehealth: Payer: Self-pay

## 2020-12-29 MED ORDER — RYBELSUS 7 MG PO TABS: 7.0000 mg | ORAL_TABLET | Freq: Every day | ORAL | 1 refills | Status: DC

## 2020-12-29 NOTE — Chronic Care Management (AMB) (Signed)
    Chronic Care Management Pharmacy Assistant   Name: KEYONNE FORSHA  [name]: [id number] DOB: 04-May-1951   Reason for Encounter: Rybelsus Patient assistance application    Medications: Outpatient Encounter Medications as of 12/29/2020  Medication Sig   carvedilol (COREG) 12.5 MG tablet Take 1 tablet (12.5 mg total) by mouth 2 (two) times daily.   Cholecalciferol (VITAMIN D3 SUPER STRENGTH) 50 MCG (2000 UT) CAPS Take 2,000 Units by mouth daily.   dapagliflozin propanediol (FARXIGA) 10 MG TABS tablet Take 1 tablet (10 mg total) by mouth daily before breakfast.   dexlansoprazole (DEXILANT) 60 MG capsule Take 1 capsule (60 mg total) by mouth daily.   febuxostat (ULORIC) 40 MG tablet Take 1 tablet (40 mg total) by mouth daily.   levothyroxine (SYNTHROID) 50 MCG tablet Take 1 tablet (50 mcg total) by mouth daily.   mupirocin ointment (BACTROBAN) 2 % Apply 1 application topically daily. With dressing changes   sacubitril-valsartan (ENTRESTO) 24-26 MG Take 1 tablet by mouth 2 (two) times daily.   Semaglutide (RYBELSUS) 3 MG TABS Take 3 mg by mouth daily.   sitaGLIPtin (JANUVIA) 100 MG tablet Take 1 tablet (100 mg total) by mouth daily. (Patient not taking: Reported on 12/02/2020)   spironolactone (ALDACTONE) 25 MG tablet Take 1 tablet (25 mg total) by mouth daily.   No facility-administered encounter medications on file as of 12/29/2020.    Contacted [employer] I contacted [employer] disk for Mr. Izsak Hartunian it looks like we have to send a new correct PAP form due to the last form that they received states that it was requested for both the '3mg'$  and 7 mg Ryblesus it looks like he's on 3 mg & I confirmed that with them and they said they will need a medical necessity form faxed as well stating why not to titrate up on the medication after that is completed they will be able to refill his Rybelsus. Per CPP I will send new application of 7 mg Rybelsus so it can be signed by the prescriber.  Corrie Mckusick,  [place]

## 2020-12-29 NOTE — Addendum Note (Signed)
Addended by: Madelin Rear on: 12/29/2020 04:48 PM   Modules accepted: Orders

## 2020-12-29 NOTE — Telephone Encounter (Signed)
  Chronic Care Management  Outreach Note   Name: KHALI WESTFAHL [name]: [id number] DOB: 11-20-1950  Referred by: Venita Lick, NP Reason for referral: medication assistance   Spoke with patient - wanting to check on status of patient assistance order for Rybellsus 7 mg and wants to ensure delivery is set up for his home address. Currently has #7 on hand.   Madelin Rear, PharmD, CPP Clinical Pharmacist Practitioner  [phone]

## 2021-01-05 ENCOUNTER — Telehealth: Payer: Self-pay

## 2021-01-05 NOTE — Telephone Encounter (Signed)
Copied from [place] [id number]. Topic: General - Other >> Jan 05, 2021 10:02 AM Loma Boston wrote: Reason for CRM: Just spoke to [facility], pt is out of meds and was with a special RX program with [facility].  pt needs a subscriber form signed, per [place]  pls contact pt as urgent med need. [phone] Pt stated that he called a week ago Monday, filled out a form 3 way,  was supposed to be shipped, pt is now out of Semaglutide (RYBELSUS) 7 MG TABS 120 tablet 1 12/29/2020    Sig - Route: Take 7 mg by mouth daily. - Oral  Pt is states that he has a 3 mg RYBELSUS that was initial started on and got an extra packet in the beginning,  Pt is also wanting to know if in the intermit he can take the '6mg'$  till this is resolved. Pt states would like a cb at [phone]   Routing to [place] to advise. Patient assistance form was faxed back last week, just awaiting shipment. Please advise if patient can take 6 mg until the 7 mg comes in.

## 2021-01-05 NOTE — Telephone Encounter (Signed)
Patient notified that Ryan [name] said he could take 6 mg until his shipment comes in. Patient verbalized understanding and thanked me for the call back.

## 2021-01-09 ENCOUNTER — Ambulatory Visit (INDEPENDENT_AMBULATORY_CARE_PROVIDER_SITE_OTHER): Payer: Medicare Other | Admitting: Nurse Practitioner

## 2021-01-09 ENCOUNTER — Encounter: Payer: Self-pay | Admitting: Nurse Practitioner

## 2021-01-09 ENCOUNTER — Other Ambulatory Visit: Payer: Self-pay

## 2021-01-09 VITALS — BP 130/82 | HR 80 | Temp 98.2°F | Wt 189.2 lb

## 2021-01-09 DIAGNOSIS — I152 Hypertension secondary to endocrine disorders: Secondary | ICD-10-CM | POA: Diagnosis not present

## 2021-01-09 DIAGNOSIS — E1159 Type 2 diabetes mellitus with other circulatory complications: Secondary | ICD-10-CM

## 2021-01-09 DIAGNOSIS — N183 Chronic kidney disease, stage 3 unspecified: Secondary | ICD-10-CM | POA: Diagnosis not present

## 2021-01-09 DIAGNOSIS — E039 Hypothyroidism, unspecified: Secondary | ICD-10-CM

## 2021-01-09 DIAGNOSIS — E785 Hyperlipidemia, unspecified: Secondary | ICD-10-CM | POA: Diagnosis not present

## 2021-01-09 DIAGNOSIS — Z95810 Presence of automatic (implantable) cardiac defibrillator: Secondary | ICD-10-CM | POA: Diagnosis not present

## 2021-01-09 DIAGNOSIS — I5022 Chronic systolic (congestive) heart failure: Secondary | ICD-10-CM

## 2021-01-09 DIAGNOSIS — E1169 Type 2 diabetes mellitus with other specified complication: Secondary | ICD-10-CM

## 2021-01-09 DIAGNOSIS — E1122 Type 2 diabetes mellitus with diabetic chronic kidney disease: Secondary | ICD-10-CM | POA: Diagnosis not present

## 2021-01-09 NOTE — Assessment & Plan Note (Signed)
Chronic, ongoing.  Continue current medication regimen and adjust as needed based on labs.  TSH and Free T4 up to date.

## 2021-01-09 NOTE — Assessment & Plan Note (Signed)
Chronic, ongoing with HF.  A1C today 6.2%, downward trend from 7.8% last visit and has lost some weight, and urine ALB 30 in January 2022.  Continue current medication regimen with of Rybelsus 7 MG daily (he would prefer to avoid injectable and can not increase Invokana further due to current eGFR) + continue collaboration with cardiology.  Recommend he check BS at home at least a few mornings a week with goal fasting <130, bring to next visit.  Continue to collaborate with CCM crew.  Return to office in 3 months.

## 2021-01-09 NOTE — Assessment & Plan Note (Signed)
Chronic, stable with BP at goal today and on home readings.  Continue current medication regimen and collaboration with cardiology.  Recommend checking BP at home at least a few mornings a week + focus on DASH diet.  BMP today.  Return in 3 months.

## 2021-01-09 NOTE — Assessment & Plan Note (Addendum)
Chronic, stable.  Euvolemic today.  Continue current medication regimen and collaboration with cardiology.  BMP today.   Recommend: - Reminded to call for an overnight weight gain of >2 pounds or a weekly weight weight of >5 pounds - not adding salt to his food and has been reading food labels. Reviewed the importance of keeping daily sodium intake to 2000mg  daily  - Avoid NSAIDS

## 2021-01-09 NOTE — Assessment & Plan Note (Signed)
Chronic, ongoing.  No current medication due to poor tolerance.   Would benefit from statin use with diabetes and heart health or consider injectable, discussed with him today.  LDL last visit was 74.  Lipid panel today.  He is agreeable to trial of Rosuvastatin on 3 day or 1 day a week schedule if LDL >70 this check, as current ASCVD 38.7%.

## 2021-01-09 NOTE — Patient Instructions (Signed)
Diabetes Mellitus and Nutrition, Adult When you have diabetes, or diabetes mellitus, it is very important to have healthy eating habits because your blood sugar (glucose) levels are greatly affected by what you eat and drink. Eating healthy foods in the right amounts, at about the same times every day, can help you:  Control your blood glucose.  Lower your risk of heart disease.  Improve your blood pressure.  Reach or maintain a healthy weight. What can affect my meal plan? Every person with diabetes is different, and each person has different needs for a meal plan. Your health care provider may recommend that you work with a dietitian to make a meal plan that is best for you. Your meal plan may vary depending on factors such as:  The calories you need.  The medicines you take.  Your weight.  Your blood glucose, blood pressure, and cholesterol levels.  Your activity level.  Other health conditions you have, such as heart or kidney disease. How do carbohydrates affect me? Carbohydrates, also called carbs, affect your blood glucose level more than any other type of food. Eating carbs naturally raises the amount of glucose in your blood. Carb counting is a method for keeping track of how many carbs you eat. Counting carbs is important to keep your blood glucose at a healthy level, especially if you use insulin or take certain oral diabetes medicines. It is important to know how many carbs you can safely have in each meal. This is different for every person. Your dietitian can help you calculate how many carbs you should have at each meal and for each snack. How does alcohol affect me? Alcohol can cause a sudden decrease in blood glucose (hypoglycemia), especially if you use insulin or take certain oral diabetes medicines. Hypoglycemia can be a life-threatening condition. Symptoms of hypoglycemia, such as sleepiness, dizziness, and confusion, are similar to symptoms of having too much  alcohol.  Do not drink alcohol if: ? Your health care provider tells you not to drink. ? You are pregnant, may be pregnant, or are planning to become pregnant.  If you drink alcohol: ? Do not drink on an empty stomach. ? Limit how much you use to:  0-1 drink a day for women.  0-2 drinks a day for men. ? Be aware of how much alcohol is in your drink. In the [place] one drink equals one 12 oz bottle of beer (355 mL), one 5 oz glass of wine (148 mL), or one 1 oz glass of hard liquor (44 mL). ? Keep yourself hydrated with water, diet soda, or unsweetened iced tea.  Keep in mind that regular soda, juice, and other mixers may contain a lot of sugar and must be counted as carbs. What are tips for following this plan? Reading food labels  Start by checking the serving size on the "Nutrition Facts" label of packaged foods and drinks. The amount of calories, carbs, fats, and other nutrients listed on the label is based on one serving of the item. Many items contain more than one serving per package.  Check the total grams (g) of carbs in one serving. You can calculate the number of servings of carbs in one serving by dividing the total carbs by 15. For example, if a food has 30 g of total carbs per serving, it would be equal to 2 servings of carbs.  Check the number of grams (g) of saturated fats and trans fats in one serving. Choose foods that have   a low amount or none of these fats.  Check the number of milligrams (mg) of salt (sodium) in one serving. Most people should limit total sodium intake to less than 2,300 mg per day.  Always check the nutrition information of foods labeled as "low-fat" or "nonfat." These foods may be higher in added sugar or refined carbs and should be avoided.  Talk to your dietitian to identify your daily goals for nutrients listed on the label. Shopping  Avoid buying canned, pre-made, or processed foods. These foods tend to be high in fat, sodium, and added  sugar.  Shop around the outside edge of the grocery store. This is where you will most often find fresh fruits and vegetables, bulk grains, fresh meats, and fresh dairy. Cooking  Use low-heat cooking methods, such as baking, instead of high-heat cooking methods like deep frying.  Cook using healthy oils, such as olive, canola, or sunflower oil.  Avoid cooking with butter, cream, or high-fat meats. Meal planning  Eat meals and snacks regularly, preferably at the same times every day. Avoid going long periods of time without eating.  Eat foods that are high in fiber, such as fresh fruits, vegetables, beans, and whole grains. Talk with your dietitian about how many servings of carbs you can eat at each meal.  Eat 4-6 oz (112-168 g) of lean protein each day, such as lean meat, chicken, fish, eggs, or tofu. One ounce (oz) of lean protein is equal to: ? 1 oz (28 g) of meat, chicken, or fish. ? 1 egg. ?  cup (62 g) of tofu.  Eat some foods each day that contain healthy fats, such as avocado, nuts, seeds, and fish.   What foods should I eat? Fruits Berries. Apples. Oranges. Peaches. Apricots. Plums. Grapes. Mango. Papaya. Pomegranate. Kiwi. Cherries. Vegetables Lettuce. Spinach. Leafy greens, including kale, chard, collard greens, and mustard greens. Beets. Cauliflower. Cabbage. Broccoli. Carrots. Green beans. Tomatoes. Peppers. Onions. Cucumbers. Brussels sprouts. Grains Whole grains, such as whole-wheat or whole-grain bread, crackers, tortillas, cereal, and pasta. Unsweetened oatmeal. Quinoa. Brown or wild rice. Meats and other proteins Seafood. Poultry without skin. Lean cuts of poultry and beef. Tofu. Nuts. Seeds. Dairy Low-fat or fat-free dairy products such as milk, yogurt, and cheese. The items listed above may not be a complete list of foods and beverages you can eat. Contact a dietitian for more information. What foods should I avoid? Fruits Fruits canned with  syrup. Vegetables Canned vegetables. Frozen vegetables with butter or cream sauce. Grains Refined white flour and flour products such as bread, pasta, snack foods, and cereals. Avoid all processed foods. Meats and other proteins Fatty cuts of meat. Poultry with skin. Breaded or fried meats. Processed meat. Avoid saturated fats. Dairy Full-fat yogurt, cheese, or milk. Beverages Sweetened drinks, such as soda or iced tea. The items listed above may not be a complete list of foods and beverages you should avoid. Contact a dietitian for more information. Questions to ask a health care provider  Do I need to meet with a diabetes educator?  Do I need to meet with a dietitian?  What number can I call if I have questions?  When are the best times to check my blood glucose? Where to find more information:  American Diabetes Association: diabetes.org  Academy of Nutrition and Dietetics: www.eatright.org  National Institute of Diabetes and Digestive and Kidney Diseases: www.niddk.nih.gov  Association of Diabetes Care and Education Specialists: www.diabeteseducator.org Summary  It is important to have healthy eating   habits because your blood sugar (glucose) levels are greatly affected by what you eat and drink.  A healthy meal plan will help you control your blood glucose and maintain a healthy lifestyle.  Your health care provider may recommend that you work with a dietitian to make a meal plan that is best for you.  Keep in mind that carbohydrates (carbs) and alcohol have immediate effects on your blood glucose levels. It is important to count carbs and to use alcohol carefully. This information is not intended to replace advice given to you by your health care provider. Make sure you discuss any questions you have with your health care provider. Document Revised: 04/03/2019 Document Reviewed: 04/03/2019 Elsevier Patient Education  2021 Elsevier Inc.  

## 2021-01-09 NOTE — Progress Notes (Signed)
BP 130/82   Pulse 80   Temp 98.2 F (36.8 C) (Oral)   Wt 189 lb 3.2 oz (85.8 kg)   PF 98 L/min   BMI 25.66 kg/m    Subjective:    Patient ID: Ryan [name], male    DOB: 1951/04/08, 70 y.o.   [name]: [id number]  HPI: Ryan [name] is a 70 y.o. male  Chief Complaint  Patient presents with   Diabetes    Patient states he is here for a follow up on his Diabetes as he has a change in medication. Patient states he is feel as he doing well with new medication.    DIABETES Last A1C 7.8%.  Takes Invokana and Rybelsus started last visit, obtaining this through assistance -- is working with CCM team.  Had intolerance to Metformin. Hypoglycemic episodes:no Polydipsia/polyuria: no Visual disturbance: no Chest pain: no Paresthesias: no Glucose Monitoring: no             Accucheck frequency: daily             Fasting glucose: 117 to 189             Post prandial:             Evening:             Before meals: Taking Insulin?: no             Long acting insulin:             Short acting insulin: Blood Pressure Monitoring: daily Retinal Examination: Up To Date Foot Exam: Up to Date Pneumovax: took several years, refuses Influenza: refused Aspirin: no   CHRONIC KIDNEY DISEASE Last labs CRT 1.26 and GFR 62. CKD status: controlled Medications renally dose: yes Previous renal evaluation: no Pneumovax: Up To Date Influenza Vaccine:  Up to Date   HYPOTHYROIDISM Continues Levothyroxine 50 MCG and TSH 3.640. Thyroid control status:stable Satisfied with current treatment? yes Medication side effects: no Medication compliance: good compliance Etiology of hypothyroidism:  Recent dose adjustment:no Fatigue: no Cold intolerance: no Heat intolerance: no Weight gain: no Weight loss: no Constipation: no Diarrhea/loose stools: no Palpitations: no Lower extremity edema: no Anxiety/depressed mood: no   HYPERTENSION / HYPERLIPIDEMIA/HF Is followed by cardiology, Dr. Caryl Comes, last saw  02/12/20.  Continues on Aldactone, Entresto, Coreg.   No current statin as reports cardiology took him off this.  Tried Pravastatin in past with poor response, with terrible joint pain.  Has 2 years on battery life for pacemaker.  Had pacer check 10/29/20. Satisfied with current treatment? yes Duration of hypertension: chronic BP monitoring frequency: a few times a week BP range: 110-120/80 on average BP medication side effects: no Duration of hyperlipidemia: chronic Medication compliance: good compliance Aspirin: no Recent stressors: no Recurrent headaches: no Visual changes: no Palpitations: no Dyspnea: no Chest pain: no Lower extremity edema: no Dizzy/lightheaded: no  The 10-year ASCVD risk score Mikey Bussing DC Jr., et al., 2013) is: 38.7%   Values used to calculate the score:     Age: 35 years     Sex: Male     Is Non-Hispanic African American: No     Diabetic: Yes     Tobacco smoker: No     Systolic Blood Pressure: 536 mmHg     Is BP treated: Yes     HDL Cholesterol: 30 mg/dL     Total Cholesterol: 140 mg/dL  Relevant past medical, surgical, family and social history reviewed and updated  as indicated. Interim medical history since our last visit reviewed. Allergies and medications reviewed and updated.  Review of Systems  Constitutional:  Negative for activity change, diaphoresis, fatigue and fever.  Respiratory:  Negative for cough, chest tightness, shortness of breath and wheezing.   Cardiovascular:  Negative for chest pain, palpitations and leg swelling.  Gastrointestinal: Negative.   Endocrine: Negative for polydipsia, polyphagia and polyuria.  Neurological: Negative.   Psychiatric/Behavioral: Negative.     Per HPI unless specifically indicated above     Objective:    BP 130/82   Pulse 80   Temp 98.2 F (36.8 C) (Oral)   Wt 189 lb 3.2 oz (85.8 kg)   PF 98 L/min   BMI 25.66 kg/m   Wt Readings from Last 3 Encounters:  01/09/21 189 lb 3.2 oz (85.8 kg)  09/03/20  200 lb 3.2 oz (90.8 kg)  06/04/20 198 lb (89.8 kg)    Physical Exam Vitals and nursing note reviewed.  Constitutional:      General: He is awake. He is not in acute distress.    Appearance: He is well-developed and well-groomed. He is not ill-appearing.  HENT:     Head: Normocephalic and atraumatic.     Right Ear: Hearing normal. No drainage.     Left Ear: Hearing normal. No drainage.  Eyes:     General: Lids are normal.        Right eye: No discharge.        Left eye: No discharge.     Conjunctiva/sclera: Conjunctivae normal.     Pupils: Pupils are equal, round, and reactive to light.  Neck:     Vascular: No carotid bruit.  Cardiovascular:     Rate and Rhythm: Normal rate and regular rhythm.     Heart sounds: Normal heart sounds, S1 normal and S2 normal. No murmur heard.   No gallop.  Pulmonary:     Effort: Pulmonary effort is normal. No accessory muscle usage or respiratory distress.     Breath sounds: Normal breath sounds.  Abdominal:     General: Bowel sounds are normal.     Palpations: Abdomen is soft.  Musculoskeletal:        General: Normal range of motion.     Cervical back: Normal range of motion and neck supple.     Right lower leg: No edema.     Left lower leg: No edema.  Skin:    General: Skin is warm and dry.     Capillary Refill: Capillary refill takes less than 2 seconds.  Neurological:     Mental Status: He is alert and oriented to person, place, and time.  Psychiatric:        Attention and Perception: Attention normal.        Mood and Affect: Mood normal.        Speech: Speech normal.        Behavior: Behavior normal. Behavior is cooperative.        Thought Content: Thought content normal.   Results for orders placed or performed in visit on 10/29/20  CUP PACEART REMOTE DEVICE CHECK  Result Value Ref Range   Date Time Interrogation Session [id number]    Pulse Generator Manufacturer MERM    Pulse Gen Model Cypress MRI Quad CRT-D    Pulse  Gen Serial Number [id number]    Clinic Name [facility]    Implantable Pulse Generator Type Cardiac Resynch Therapy Defibulator    Implantable Pulse Generator Implant  Date [id number]    Implantable Lead Manufacturer [facility]    Implantable Lead Model [id number] Quartet    Implantable Lead Serial Number [id number]    Implantable Lead Implant Date 07121975    Implantable Lead Location Detail 1 UNKNOWN    Implantable Lead Location [id number]    Implantable Lead Manufacturer [facility]    Implantable Lead Model [id number] Sprint Quattro Secure    Implantable Lead Serial Number [id number]    Implantable Lead Implant Date 54982641    Implantable Lead Location [id number]    Implantable Lead Manufacturer MERM    Implantable Lead Model 5076 CapSureFix Novus    Implantable Lead Serial Number [id number]    Implantable Lead Implant Date 58309407    Implantable Lead Location [id number]    Lead Channel Setting Sensing Sensitivity 0.3 mV   Lead Channel Setting Pacing Amplitude 1.25 V   Lead Channel Setting Pacing Pulse Width 0.4 ms   Lead Channel Setting Pacing Amplitude 1.5 V   Lead Channel Setting Pacing Pulse Width 0.4 ms   Lead Channel Setting Pacing Amplitude 2 V   Lead Channel Setting Pacing Capture Mode Monitor Capture    Lead Channel Impedance Value 418 ohm   Lead Channel Sensing Intrinsic Amplitude 1 mV   Lead Channel Sensing Intrinsic Amplitude 1 mV   Lead Channel Pacing Threshold Amplitude 0.625 V   Lead Channel Pacing Threshold Pulse Width 0.4 ms   Lead Channel Impedance Value 589 ohm   Lead Channel Impedance Value 513 ohm   Lead Channel Pacing Threshold Amplitude 0.625 V   Lead Channel Pacing Threshold Pulse Width 0.4 ms   HighPow Impedance 48 ohm   HighPow Impedance 61 ohm   Lead Channel Impedance Value 817 ohm   Lead Channel Impedance Value 836 ohm   Lead Channel Impedance Value 874 ohm   Lead Channel Impedance Value 760 ohm   Lead Channel Impedance Value 817 ohm   Lead Channel Impedance Value 817 ohm    Lead Channel Impedance Value 513 ohm   Lead Channel Impedance Value 456 ohm   Lead Channel Impedance Value 456 ohm   Lead Channel Impedance Value 513 ohm   Lead Channel Impedance Value 241.412    Lead Channel Impedance Value 241.412    Lead Channel Impedance Value 256.5 ohm   Lead Channel Impedance Value 241.412    Lead Channel Impedance Value 241.412    Lead Channel Pacing Threshold Amplitude 1 V   Lead Channel Pacing Threshold Pulse Width 0.4 ms   Battery Status OK    Battery Remaining Longevity 31 mo   Battery Voltage 2.95 V   Brady Statistic RA Percent Paced 6.12 %   Brady Statistic RV Percent Paced 99.98 %   Brady Statistic AP VP Percent 6.11 %   Brady Statistic AS VP Percent 93.88 %   Brady Statistic AP VS Percent 0.01 %   Brady Statistic AS VS Percent 0 %      Assessment & Plan:   Problem List Items Addressed This Visit       Cardiovascular and Mediastinum   Hypertension associated with diabetes ([place])    Chronic, stable with BP at goal today and on home readings.  Continue current medication regimen and collaboration with cardiology.  Recommend checking BP at home at least a few mornings a week + focus on DASH diet.  BMP today.  Return in 3 months.      Relevant Orders   Bayer DCA Hb A1c Waived   Basic metabolic  panel   Systolic CHF (HCC)    Chronic, stable.  Euvolemic today.  Continue current medication regimen and collaboration with cardiology.  BMP today.   Recommend: - Reminded to call for an overnight weight gain of >2 pounds or a weekly weight weight of >5 pounds - not adding salt to his food and has been reading food labels. Reviewed the importance of keeping daily sodium intake to <203m daily  - Avoid NSAIDS      Type 2 diabetes mellitus with cardiac complication (HCC) - Primary    Chronic, ongoing with HF.  A1C today 6.2%, downward trend from 7.8% last visit and has lost some weight, and urine ALB 30 in January 2022.  Continue current medication regimen  with of Rybelsus 7 MG daily (he would prefer to avoid injectable and can not increase Invokana further due to current eGFR) + continue collaboration with cardiology.  Recommend he check BS at home at least a few mornings a week with goal fasting <130, bring to next visit.  Continue to collaborate with CCM crew.  Return to office in 3 months.      Relevant Orders   Bayer DCA Hb A1c Waived     Endocrine   Hyperlipidemia associated with type 2 diabetes mellitus (HCC)    Chronic, ongoing.  No current medication due to poor tolerance.   Would benefit from statin use with diabetes and heart health or consider injectable, discussed with him today.  LDL last visit was 74.  Lipid panel today.  He is agreeable to trial of Rosuvastatin on 3 day or 1 day a week schedule if LDL >70 this check, as current ASCVD 38.7%.      Relevant Orders   Bayer DCA Hb A1c Waived   Lipid Panel w/o Chol/HDL Ratio   Hypothyroidism    Chronic, ongoing.  Continue current medication regimen and adjust as needed based on labs.  TSH and Free T4 up to date.      CKD stage 3 due to type 2 diabetes mellitus (HCC)    Ongoing, 3a, improved last visit.  Recheck BMP today and monitor closely.  Consider referral to nephrology if worsening.        Other   Biventricular implantable cardioverter-defibrillator in situ    Continue collaboration with cardiology and regular checks.        Follow up plan: Return in about 3 months (around 04/10/2021) for T2DM, HTN/HLD, THYROID.

## 2021-01-09 NOTE — Assessment & Plan Note (Addendum)
Ongoing, 3a, improved last visit.  Recheck BMP today and monitor closely.  Consider referral to nephrology if worsening.

## 2021-01-09 NOTE — Assessment & Plan Note (Signed)
Continue collaboration with cardiology and regular checks. 

## 2021-01-10 LAB — LIPID PANEL W/O CHOL/HDL RATIO
Cholesterol, Total: 128 mg/dL (ref 100–199)
HDL: 34 mg/dL — ABNORMAL LOW (ref >39)
LDL Chol Calc (NIH): 68 mg/dL (ref 0–99)
Triglycerides: 146 mg/dL (ref 0–149)
VLDL Cholesterol Cal: 26 mg/dL (ref 5–40)

## 2021-01-10 LAB — BASIC METABOLIC PANEL
BUN/Creatinine Ratio: 16 (ref 10–24)
BUN: 20 mg/dL (ref 8–27)
CO2: 25 mmol/L (ref 20–29)
Calcium: 9.8 mg/dL (ref 8.6–10.2)
Chloride: 101 mmol/L (ref 96–106)
Creatinine, Ser: 1.24 mg/dL (ref 0.76–1.27)
Glucose: 147 mg/dL — ABNORMAL HIGH (ref 65–99)
Potassium: 4.3 mmol/L (ref 3.5–5.2)
Sodium: 140 mmol/L (ref 134–144)
eGFR: 63 mL/min/{1.73_m2} (ref >59)

## 2021-01-10 NOTE — Progress Notes (Signed)
Contacted via MyChart   Good evening Ryan [name], your labs have returned.  Kidney function, creatinine and eGFR, remains in normal range.  GREAT NEWS!!  Sugar level, glucose, has trended down.  Cholesterol levels continue to look great. Fantastic!!  Any questions? Keep being stellar!!  Thank you for allowing me to participate in your care.  I appreciate you. Kindest regards, [name]

## 2021-01-27 LAB — BAYER DCA HB A1C WAIVED: HB A1C (BAYER DCA - WAIVED): 6.2 % — ABNORMAL HIGH (ref 4.8–5.6)

## 2021-01-28 ENCOUNTER — Ambulatory Visit (INDEPENDENT_AMBULATORY_CARE_PROVIDER_SITE_OTHER): Payer: Medicare Other

## 2021-01-28 DIAGNOSIS — I428 Other cardiomyopathies: Secondary | ICD-10-CM | POA: Diagnosis not present

## 2021-01-28 LAB — CUP PACEART REMOTE DEVICE CHECK
Battery Remaining Longevity: 28 mo
Battery Voltage: 2.94 V
Brady Statistic AP VP Percent: 0.69 %
Brady Statistic AP VS Percent: 0.01 %
Brady Statistic AS VP Percent: 99.3 %
Brady Statistic AS VS Percent: 0 %
Brady Statistic RA Percent Paced: 0.7 %
Brady Statistic RV Percent Paced: 99.98 %
Date Time Interrogation Session: 20220921033527
HighPow Impedance: 48 Ohm
HighPow Impedance: 59 Ohm
Implantable Lead Implant Date: 20060505
Implantable Lead Implant Date: 20120111
Implantable Lead Implant Date: 20180314
Implantable Lead Location: 753858
Implantable Lead Location: 753859
Implantable Lead Location: 753860
Implantable Lead Model: 5076
Implantable Lead Model: 6947
Implantable Pulse Generator Implant Date: 20180314
Lead Channel Impedance Value: 228 Ohm
Lead Channel Impedance Value: 228 Ohm
Lead Channel Impedance Value: 245.538
Lead Channel Impedance Value: 245.538
Lead Channel Impedance Value: 245.538
Lead Channel Impedance Value: 399 Ohm
Lead Channel Impedance Value: 456 Ohm
Lead Channel Impedance Value: 456 Ohm
Lead Channel Impedance Value: 456 Ohm
Lead Channel Impedance Value: 475 Ohm
Lead Channel Impedance Value: 532 Ohm
Lead Channel Impedance Value: 551 Ohm
Lead Channel Impedance Value: 722 Ohm
Lead Channel Impedance Value: 722 Ohm
Lead Channel Impedance Value: 760 Ohm
Lead Channel Impedance Value: 779 Ohm
Lead Channel Impedance Value: 817 Ohm
Lead Channel Impedance Value: 836 Ohm
Lead Channel Pacing Threshold Amplitude: 0.625 V
Lead Channel Pacing Threshold Amplitude: 0.625 V
Lead Channel Pacing Threshold Amplitude: 1.625 V
Lead Channel Pacing Threshold Pulse Width: 0.4 ms
Lead Channel Pacing Threshold Pulse Width: 0.4 ms
Lead Channel Pacing Threshold Pulse Width: 0.4 ms
Lead Channel Sensing Intrinsic Amplitude: 1 mV
Lead Channel Sensing Intrinsic Amplitude: 1 mV
Lead Channel Setting Pacing Amplitude: 1.25 V
Lead Channel Setting Pacing Amplitude: 1.5 V
Lead Channel Setting Pacing Amplitude: 2 V
Lead Channel Setting Pacing Pulse Width: 0.4 ms
Lead Channel Setting Pacing Pulse Width: 0.4 ms
Lead Channel Setting Sensing Sensitivity: 0.3 mV

## 2021-01-29 ENCOUNTER — Telehealth: Payer: Self-pay

## 2021-01-29 ENCOUNTER — Telehealth: Payer: Self-pay | Admitting: Nurse Practitioner

## 2021-01-29 NOTE — Telephone Encounter (Signed)
Received 4 boxes of Rybelus 7 mg today. Called and notified patient that they were ready to be picked up.

## 2021-02-04 ENCOUNTER — Ambulatory Visit (INDEPENDENT_AMBULATORY_CARE_PROVIDER_SITE_OTHER): Payer: Medicare Other

## 2021-02-04 ENCOUNTER — Telehealth: Payer: Medicare Other | Admitting: General Practice

## 2021-02-04 DIAGNOSIS — E785 Hyperlipidemia, unspecified: Secondary | ICD-10-CM

## 2021-02-04 DIAGNOSIS — E1159 Type 2 diabetes mellitus with other circulatory complications: Secondary | ICD-10-CM

## 2021-02-04 DIAGNOSIS — Z0000 Encounter for general adult medical examination without abnormal findings: Secondary | ICD-10-CM

## 2021-02-04 DIAGNOSIS — I152 Hypertension secondary to endocrine disorders: Secondary | ICD-10-CM

## 2021-02-04 DIAGNOSIS — E1169 Type 2 diabetes mellitus with other specified complication: Secondary | ICD-10-CM

## 2021-02-04 NOTE — Progress Notes (Addendum)
Subjective:    Ryan [name] is a 70 y.o. male who presents for Medicare Annual/Subsequent preventive examination.   Preventive Screening-Counseling & Management  Tobacco Social History   Tobacco Use  Smoking Status Former   Types: Cigarettes   Quit date: 12/30/1989   Years since quitting: 31.1  Smokeless Tobacco Never    Problems Prior to Visit 1.   Current Problems (verified) Patient Active Problem List   Diagnosis Date Noted   CKD stage 3 due to type 2 diabetes mellitus ([place]) 08/30/2019   Type 2 diabetes mellitus with cardiac complication ([place]) 76/14/7092   Hypothyroidism 05/26/2019   Advanced care planning/counseling discussion 95/74/7340   Systolic CHF ([place]) 37/01/6437   BPH (benign prostatic hyperplasia) 04/14/2016   Hypertension associated with diabetes ([place]) 12/16/2014   Gout 12/16/2014   Hyperlipidemia associated with type 2 diabetes mellitus ([place]) 06/13/2012   Biventricular implantable cardioverter-defibrillator in situ 09/28/2010   Idiopathic cardiomyopathy ([place]) 01/27/2009   Myalgia due to statin 01/27/2009    Medications Prior to Visit Current Outpatient Medications on File Prior to Visit  Medication Sig Dispense Refill   carvedilol (COREG) 12.5 MG tablet Take 1 tablet (12.5 mg total) by mouth 2 (two) times daily. 180 tablet 4   Cholecalciferol (VITAMIN D3 SUPER STRENGTH) 50 MCG (2000 UT) CAPS Take 2,000 Units by mouth daily.     dapagliflozin propanediol (FARXIGA) 10 MG TABS tablet Take 1 tablet (10 mg total) by mouth daily before breakfast. 90 tablet 4   dexlansoprazole (DEXILANT) 60 MG capsule Take 1 capsule (60 mg total) by mouth daily. 90 capsule 4   febuxostat (ULORIC) 40 MG tablet Take 1 tablet (40 mg total) by mouth daily. 90 tablet 4   levothyroxine (SYNTHROID) 50 MCG tablet Take 1 tablet (50 mcg total) by mouth daily. 90 tablet 4   mupirocin ointment (BACTROBAN) 2 % Apply 1 application topically daily. With dressing changes 22 g 0    sacubitril-valsartan (ENTRESTO) 24-26 MG Take 1 tablet by mouth 2 (two) times daily. 180 tablet 4   Semaglutide (RYBELSUS) 7 MG TABS Take 7 mg by mouth daily. 120 tablet 1   spironolactone (ALDACTONE) 25 MG tablet Take 1 tablet (25 mg total) by mouth daily. 90 tablet 4   No current facility-administered medications on file prior to visit.    Current Medications (verified) Current Outpatient Medications  Medication Sig Dispense Refill   carvedilol (COREG) 12.5 MG tablet Take 1 tablet (12.5 mg total) by mouth 2 (two) times daily. 180 tablet 4   Cholecalciferol (VITAMIN D3 SUPER STRENGTH) 50 MCG (2000 UT) CAPS Take 2,000 Units by mouth daily.     dapagliflozin propanediol (FARXIGA) 10 MG TABS tablet Take 1 tablet (10 mg total) by mouth daily before breakfast. 90 tablet 4   dexlansoprazole (DEXILANT) 60 MG capsule Take 1 capsule (60 mg total) by mouth daily. 90 capsule 4   febuxostat (ULORIC) 40 MG tablet Take 1 tablet (40 mg total) by mouth daily. 90 tablet 4   levothyroxine (SYNTHROID) 50 MCG tablet Take 1 tablet (50 mcg total) by mouth daily. 90 tablet 4   mupirocin ointment (BACTROBAN) 2 % Apply 1 application topically daily. With dressing changes 22 g 0   sacubitril-valsartan (ENTRESTO) 24-26 MG Take 1 tablet by mouth 2 (two) times daily. 180 tablet 4   Semaglutide (RYBELSUS) 7 MG TABS Take 7 mg by mouth daily. 120 tablet 1   spironolactone (ALDACTONE) 25 MG tablet Take 1 tablet (25 mg total) by mouth daily. [place]  tablet 4   No current facility-administered medications for this visit.     Allergies (verified) Ace inhibitors and Crestor [rosuvastatin calcium]   PAST HISTORY  Family History Family History  Problem Relation Age of Onset   Breast cancer Mother    Diabetes Mother    Heart disease Mother    Cancer Mother        breast   Lung cancer Father    Diabetes Father    Cancer Father        lung   Diabetes Sister     Social History Social History   Tobacco Use   Smoking  status: Former    Types: Cigarettes    Quit date: 12/30/1989    Years since quitting: 31.1   Smokeless tobacco: Never  Substance Use Topics   Alcohol use: Yes    Alcohol/week: 7.0 standard drinks    Types: 7 Cans of beer per week    Are there smokers in your home (other than you)?  No  Risk Factors Current exercise habits: Home exercise routine includes walking 5 hrs per day.    Cardiac risk factors: advanced age (older than 69 for men, 45 for women), diabetes mellitus, hypertension, and male gender.  Depression Screen (Note: if answer to either of the following is "Yes", a more complete depression screening is indicated)   Q1: Over the past two weeks, have you felt down, depressed or hopeless? No  Q2: Over the past two weeks, have you felt little interest or pleasure in doing things? No  Have you lost interest or pleasure in daily life? No  Do you often feel hopeless? No  Do you cry easily over simple problems? No  Activities of Daily Living In your present state of health, do you have any difficulty performing the following activities?:  Driving? No Managing money?  No Feeding yourself? No Getting from bed to chair? No Climbing a flight of stairs? No Preparing food and eating?: No Bathing or showering? No Getting dressed: No Getting to the toilet? No Using the toilet:No Moving around from place to place: No In the past year have you fallen or had a near fall?:No   Are you sexually active?  No  Do you have more than one partner?  No  Hearing Difficulties: No Do you often ask people to speak up or repeat themselves? No Do you experience ringing or noises in your ears? No Do you have difficulty understanding soft or whispered voices? No   Do you feel that you have a problem with memory? No  Do you often misplace items? No  Do you feel safe at home?  Yes  Cognitive Testing  Alert? Yes  Normal Appearance?Yes  Oriented to person? No  Place? Yes   Time? Y   Recall  of three objects?  Yes  Can perform simple calculations? Yes  Displays appropriate judgment?Yes  Can read the correct time from a watch face?Yes   Advanced Directives have been discussed with the patient? Yes   List the Names of Other Physician/Practitioners you currently use: 1.    Indicate any recent Medical Services you may have received from other than [facility] providers in the past year (date may be approximate).  Immunization History  Administered Date(s) Administered   Pneumococcal Polysaccharide-23 04/21/2005   Td 06/10/1996, 05/20/2010   Zoster, Live 11/04/2011    Screening Tests Health Maintenance  Topic Date Due   COVID-19 Vaccine (1) Never done   Zoster Vaccines- Shingrix (  1 of 2) 04/10/2021 (Originally 10/21/1969)   INFLUENZA VACCINE  08/07/2021 (Originally 12/08/2020)   TETANUS/TDAP  09/03/2021 (Originally 05/20/2020)   COLONOSCOPY (Pts 45-17yr Insurance coverage will need to be confirmed)  01/09/2022 (Originally 10/22/1995)   OPHTHALMOLOGY EXAM  06/03/2021   FOOT EXAM  06/04/2021   HEMOGLOBIN A1C  07/09/2021   Hepatitis C Screening  Completed   HPV VACCINES  Aged Out    All answers were reviewed with the patient and necessary referrals were made:  TJerelene Redden CRamsey  02/04/2021   History reviewed: allergies, current medications, past family history, past medical history, past social history, past surgical history, and problem list  Review of Systems Defer to PCP    Objective:     Vision by Snellen chart: right ePPI:RJJOACto measure, left eZYS:AYTKZSto measure There were no vitals taken for this visit. There is no height or weight on file to calculate BMI.  AWV     Assessment:          Plan:     During the course of the visit the patient was educated and counseled about appropriate screening and preventive services including:   Influenza vaccine Td vaccine Colorectal cancer screening  Diet review for nutrition referral? Yes ____  Not  Indicated ____   Patient Instructions (the written plan) was given to the patient.  Medicare Attestation I have personally reviewed: The patient's medical and social history Their use of alcohol, tobacco or illicit drugs Their current medications and supplements The patient's functional ability including ADLs,fall risks, home safety risks, cognitive, and hearing and visual impairment Diet and physical activities Evidence for depression or mood disorders  The patient's weight, height, BMI, and visual acuity have been recorded in the chart.  I have made referrals, counseling, and provided education to the patient based on review of the above and I have provided the patient with a written personalized care plan for preventive services.     TJerelene Redden CMA   02/04/2021   Non Face to face 60 minutes  Mr. Carew , Thank you for taking time to come for your Medicare Wellness Visit. I appreciate your ongoing commitment to your health goals. Please review the following plan we discussed and let me know if I can assist you in the future.   These are the goals we discussed:  Goals       PharmD "I can't afford this medication" (pt-stated)      CARE PLAN ENTRY (see longtitudinal plan of care for additional care plan information)  Current Barriers:  Polypharmacy; complex patient with multiple comorbidities including hx HFrEF, T2DM, HTN, HLD Receiving patient assistance for SGLT2. Patient submitted requested 2020 tax forms and has received medication.. Cost concerns w/ Entresto - relieved; receiving Entresto through patient assistance Most recent eGFR: 52 mL/min HFrEF: Entresto 24/26 mg BID, carvedilol 12.5 mg BID, spironolactone 12.5 mg daily T2DM/HFrEF: Invokana 100 mg daily; A1c 7.2%.  ASCVD risk reduction: 10 year ASCVD risk score 27%. Hx statin intolerance to pravastatin and rosuvastatin w/ significant joint pain. No hx ezetimibe Gout: febuxostat 40 mg daily, last uric acid at goal  <6 Hypothyroidism: levothyroxine 50 mcg daily GERD: Dexlansoprazole 60 mg daily  Pharmacist Clinical Goal(s):  Over the next 90 days, patient will work with PharmD and provider towards optimized medication management  Interventions: Comprehensive medication review performed, medication list updated in electronic medical record Inter-disciplinary care team collaboration (see longitudinal plan of care)  Patient Self Care Activities:  Patient will take medications as prescribed  Please see past updates related to this goal by clicking on the "Past Updates" button in the selected goal        RNCM: Obtain Eye Exam-Diabetes Type 2         - schedule appointment with eye doctor    The patient states today that he is going to try and have an eye exam before his next visit with the pcp on 06-04-2020 at 10 am. 02-04-2021: Patient is compliant with eye exam. Will continue to monitor for changes.         This is a list of the screening recommended for you and due dates:  Health Maintenance  Topic Date Due   COVID-19 Vaccine (1) Never done   Zoster (Shingles) Vaccine (1 of 2) 04/10/2021*   Flu Shot  08/07/2021*   Tetanus Vaccine  09/03/2021*   Colon Cancer Screening  01/09/2022*   Eye exam for diabetics  06/03/2021   Complete foot exam   06/04/2021   Hemoglobin A1C  07/09/2021   Hepatitis C Screening: USPSTF Recommendation to screen - Ages 48-79 yo.  Completed   HPV Vaccine  Aged Out  *Topic was postponed. The date shown is not the original due date.

## 2021-02-04 NOTE — Progress Notes (Signed)
Remote ICD transmission.   

## 2021-02-04 NOTE — Chronic Care Management (AMB) (Signed)
Chronic Care Management   CCM RN Visit Note  02/04/2021 Name: Ryan [name] [name]: [id number] DOB: 04-26-1951  Subjective: Ryan [name] is a 70 y.o. year old male who is a primary care patient of Cannady, Barbaraann Faster, NP. The care management team was consulted for assistance with disease management and care coordination needs.    Engaged with patient by telephone for follow up visit in response to provider referral for case management and/or care coordination services.   Consent to Services:  The patient was given information about Chronic Care Management services, agreed to services, and gave verbal consent prior to initiation of services.  Please see initial visit note for detailed documentation.   Patient agreed to services and verbal consent obtained.   Assessment: Review of patient past medical history, allergies, medications, health status, including review of consultants reports, laboratory and other test data, was performed as part of comprehensive evaluation and provision of chronic care management services.   SDOH (Social Determinants of Health) assessments and interventions performed:  SDOH Interventions    Flowsheet Row Most Recent Value  SDOH Interventions   Food Insecurity Interventions Intervention Not Indicated  Financial Strain Interventions Other (Comment)  [gets patient assistance for medications]  Housing Interventions Intervention Not Indicated  Intimate Partner Violence Interventions Intervention Not Indicated  Physical Activity Interventions Intervention Not Indicated  Stress Interventions Intervention Not Indicated  Social Connections Interventions Other (Comment), Intervention Not Indicated  [Has a good support system and likes to travel with his wife]  Transportation Interventions Intervention Not Indicated        CCM Care Plan  Allergies  Allergen Reactions   Ace Inhibitors     Muscle aches    Crestor [Rosuvastatin Calcium] Other (See Comments)     Muscle aches    Outpatient Encounter Medications as of 02/04/2021  Medication Sig   carvedilol (COREG) 12.5 MG tablet Take 1 tablet (12.5 mg total) by mouth 2 (two) times daily.   Cholecalciferol (VITAMIN D3 SUPER STRENGTH) 50 MCG (2000 UT) CAPS Take 2,000 Units by mouth daily.   dapagliflozin propanediol (FARXIGA) 10 MG TABS tablet Take 1 tablet (10 mg total) by mouth daily before breakfast.   dexlansoprazole (DEXILANT) 60 MG capsule Take 1 capsule (60 mg total) by mouth daily.   febuxostat (ULORIC) 40 MG tablet Take 1 tablet (40 mg total) by mouth daily.   levothyroxine (SYNTHROID) 50 MCG tablet Take 1 tablet (50 mcg total) by mouth daily.   mupirocin ointment (BACTROBAN) 2 % Apply 1 application topically daily. With dressing changes   sacubitril-valsartan (ENTRESTO) 24-26 MG Take 1 tablet by mouth 2 (two) times daily.   Semaglutide (RYBELSUS) 7 MG TABS Take 7 mg by mouth daily.   spironolactone (ALDACTONE) 25 MG tablet Take 1 tablet (25 mg total) by mouth daily.   No facility-administered encounter medications on file as of 02/04/2021.    Patient Active Problem List   Diagnosis Date Noted   CKD stage 3 due to type 2 diabetes mellitus ([place]) 08/30/2019   Type 2 diabetes mellitus with cardiac complication ([place]) 07/29/2246   Hypothyroidism 05/26/2019   Advanced care planning/counseling discussion 25/00/3704   Systolic CHF ([place]) 88/89/1694   BPH (benign prostatic hyperplasia) 04/14/2016   Hypertension associated with diabetes ([place]) 12/16/2014   Gout 12/16/2014   Hyperlipidemia associated with type 2 diabetes mellitus ([place]) 06/13/2012   Biventricular implantable cardioverter-defibrillator in situ 09/28/2010   Idiopathic cardiomyopathy ([place]) 01/27/2009   Myalgia due to statin 01/27/2009    Conditions  to be addressed/monitored:HTN, HLD, and DMII  Care Plan : RNCM: Diabetes Type 2 (Adult)  Updates made by Vanita Ingles, RN since 02/04/2021 12:00 AM     Problem: Glycemic Management  (Diabetes, Type 2)      Long-Range Goal: RNCM: DM care plan   Start Date: 04/15/2020  Expected End Date: 09/29/2021  This Visit's Progress: On track  Recent Progress: On track  Priority: High  Note:   Objective:  Lab Results  Component Value Date   HGBA1C 6.2 (H) 01/09/2021    In the office at today's visit 09-03-2020: HGBA1C was 7.8% Lab Results  Component Value Date   CREATININE 1.24 01/09/2021   CREATININE 1.26 09/03/2020   CREATININE 1.39 (H) 06/04/2020   No results found for: EGFR Current Barriers:  Knowledge Deficits related to basic Diabetes pathophysiology and self care/management Knowledge Deficits related to medications used for management of diabetes Difficulty obtaining or cannot afford medications Does not use cbg meter  Unable to independently manage DM as evidence of hemoglobin A1C of 7.8% (09-03-2020), newest A1C 6.2 on 01-09-2021 Case Manager Clinical Goal(s):  patient will demonstrate improved adherence to prescribed treatment plan for diabetes self care/management as evidenced by: monitoring blood sugar per provider recommendations, adhering to a heart healthy/ADA diet and maintaining hemoglobin A1C of 7.0 or less daily monitoring and recording of CBG- the patient does not check blood sugars daily. Education and support. Will collaborate with pcp to see what expectations of checking blood sugars are adherence to ADA/ carb modified diet- 09-03-2020: Extensive education given to the patient today about the need to monitor dietary intake due to steady incline in hemoglobin A1C. The patient is upset with himself for poor dietary choices. States he and his wife just got back from the beach. He wants to do better. Empathetic listening and support.  exercise 4/5 days/week adherence to prescribed medication regimen.   Interventions:  Provided education to patient about basic DM disease process. 12-02-2020: Reviewed with the patient the importance of checking blood sugars  regularly and writing down. Encouraged the patient to bring readings to the pcp appointment for review and recommendations. Praised the patient for consistently taking his blood sugars. Ask the patient to continue to check blood sugars on a consistent basis and ask pcp recommendations on how often he should check his blood sugars.  -Reviewed medications with patient and discussed importance of medication adherence. 09-03-2020: Will collaborate with pcp and pharm D on the patient not being able to afford the Rybelsus. 02-04-2021: Patient states that he is compliant with his medications and taking as directed.  Discussed plans with patient for ongoing care management follow up and provided patient with direct contact information for care management team Provided patient with written educational materials related to hypo and hyperglycemia and importance of correct treatment. Discussed the importance of recognition of hypo and hyperglycemia and what sx and sx to look for. The patient does not check blood sugars. He denies any episodes of hypo and hyperglycemia.  Will continue to monitor and educate accordingly. 02-04-2021: The patient states that he has not had any lows. States blood sugars have been in good range.  Reviewed scheduled/upcoming provider appointments including:  04-10-2021 at 0920 am Review of patient status, including review of consultants reports, relevant laboratory and other test results, and medications completed. - barriers to adherence to treatment plan  am identified - blood glucose monitoring encouraged at  - mutual A1C goal set or reviewed- 09-03-2020: Goal of hemoglobin A1C  is 7 - resources required to improve adherence to care identified. 09-03-2020: New medication started today Rybelsus. The patient states he went to the pharmacy after his visit and after insurance his cost is $700.00. The patient states he can not afford this and will need other options. He told the pcp this. Working with  the pharm D to see if there is financial assistance for the patient.  Will continue to monitor. 02-04-2021: The patient is happy that he got his medications and is taking his medications as directed.  - self-awareness of signs/symptoms of hypo or hyperglycemia encouraged-- Review of sx and sx to monitor for with hypo  and hyperglycemia. 12-02-2020: the patient states he has not had any lows. Recommended if he had any sx and sx of low blood sugar to check his blood sugars for a reference point. Agrees to this.  Reviewed of goal of hemoglobin A1C of 7.0 or less and that the average blood sugar to achieve a 7.0 is 154. The patient is confident that his A1C will be less than 7.0 on next MD visit. Praised the patient for progress and changes in DM management. Will continue to monitor. 02-04-2021: The patient praised for getting A1C down to 6.2 at recent check on 01-09-2021: The patient states that he is hoping to get it lower. The patient is going on a trip to [place] soon with his wife and he is excited. Discussed taking all medications, adherence to dietary restrictions and monitoring for changes in conditions. The patient denies any acute needs. Will continue to monitor.  -Patient Goals/Self-Care Activities  patient will:  - UNABLE to independently manage DM as evidence of hemoglobin A1C elevated Checks blood sugars as prescribed and utilize hyper and hypoglycemia protocol as needed Adheres to prescribed ADA/carb modified Follow Up Plan: Telephone follow up appointment with care management team member scheduled for:04-15-2021 at 0930 am     Care Plan : RNCM: Hypertension (Adult) and HLD  Updates made by Vanita Ingles, RN since 02/04/2021 12:00 AM     Problem: Hypertension (Hypertension)      Long-Range Goal: RNCM: Management of Hypertension/HLD   Start Date: 09/03/2020  Expected End Date: 10/05/2021  This Visit's Progress: On track  Recent Progress: On track  Priority: Medium  Note:   BP Readings  from Last 3 Encounters:  01/09/21 130/82  09/03/20 135/84  06/04/20 132/85    Lab Results  Component Value Date   CHOL 128 01/09/2021   CHOL 140 09/03/2020   CHOL 147 06/04/2020   Lab Results  Component Value Date   HDL 34 (L) 01/09/2021   HDL 30 (L) 09/03/2020   HDL 33 (L) 06/04/2020   Lab Results  Component Value Date   LDLCALC 68 01/09/2021   LDLCALC 68 09/03/2020   LDLCALC 74 06/04/2020   Lab Results  Component Value Date   TRIG 146 01/09/2021   TRIG 262 (H) 09/03/2020   TRIG 242 (H) 06/04/2020   Lab Results  Component Value Date   CHOLHDL 4.0 05/24/2018   CHOLHDL 4.5 04/14/2016   No results found for: LDLDIRECT  Current Barriers:  Chronic Disease Management support and education needs related to HTN and HLD  Nurse Case Manager Clinical Goal(s):   patient will verbalize understanding of plan for HTN and HLD  patient will meet with RN Care Manager to address barriers to managing HTN and HLD  patient will attend all scheduled medical appointments: next appointment with pcp 04-10-2021 at 0920 am patient will  demonstrate improved adherence to prescribed treatment plan for HTN and HLD as evidenced bynormal range  Interventions:  Inter-disciplinary care team collaboration (see longitudinal plan of care) Evaluation of current treatment plan related to HTN and HLD and patient's adherence to plan as established by provider. 09-03-2020: The patient saw pcp today. Labwork done. The pcp wants the patient to start Rosuvastatin 3 mg daily due to intolerance of statins in the past. The patient agrees to this. The patient is hopeful that with dietary changes his Triglycerides are better; however is willing to try the medications. 12-02-2020: The patient is doing well with management of his HTN and HLD. His blood pressure reading today is 132/75 and HR 69. The patient is taking his blood pressure on a consistent basis. States that he is monitoring what he eats and being health minded.  Will continue to monitor. 02-04-2021: The patient is doing well with management of his HTN and HLD. The patient seen in the office recently and labs were at goal. The patient denies any acute findings. States that he is feeling great. Preparing for an upcoming trip to [place] with his wife. Will continue to monitor.  Advised patient to call for changes in blood pressure or sx/sx of worsening conditions. 02-04-2021: Advised the patient of increase risk of heart attack and stroke with elevated cholesterol levels and blood pressure. Provided education to patient re: sx and sx of HTN, following a heart healthy diet, adequate exercise. 09-03-2020: Review and education given on dietary restrictions. Will send educational material by my chart system on diabetic and DASH diets. 02-04-2021: Review of heart health and will continue to work with the patient for changes in his condition. Denies any acute concerns at this time. Discussed the importance of following heart health/ADA diet especially when he is on vacation and is subject to eat foods that are not healthy.  Reviewed medications with patient and discussed compliance. 09-03-2020: The patient is compliant with medications. Low tolerance in the past to statins but is willing to try low dose of Rosuvastatin. 02-04-2021: States compliance with medications, no new needs at this time.  Discussed plans with patient for ongoing care management follow up and provided patient with direct contact information for care management team Reviewed scheduled/upcoming provider appointments including: 04-10-2021 at 0920 am. Reminded the patient about AWV with nurse today by telephone. Will continue to monitor.   Patient Self Care Activities:  Patient will self administer medications as prescribed Patient will attend all scheduled provider appointments Patient will call pharmacy for medication refills Patient will call provider office for new concerns or questions Unable to  independently manage HTN and HLD as evidence of unable to take statins to manage HLD  Follow up with the patient on 04-15-2021 at 930 am     Plan:Telephone follow up appointment with care management team member scheduled for:  04-15-2021 at 0930 am  [place], MSN, [facility] Mobile: [phone]

## 2021-02-04 NOTE — Patient Instructions (Signed)
Visit Information  PATIENT GOALS:  Goals Addressed             This Visit's Progress    RNCM: Obtain Eye Exam-Diabetes Type 2          - schedule appointment with eye doctor    The patient states today that he is going to try and have an eye exam before his next visit with the pcp on 06-04-2020 at 10 am. 02-04-2021: Patient is compliant with eye exam. Will continue to monitor for changes.         Patient verbalizes understanding of instructions provided today and agrees to view in [place].   Telephone follow up appointment with care management team member scheduled for: 04-15-2021 at 0930 am  Noreene Larsson RN, MSN, [facility] Mobile: [phone]

## 2021-02-06 DIAGNOSIS — E1169 Type 2 diabetes mellitus with other specified complication: Secondary | ICD-10-CM

## 2021-02-06 DIAGNOSIS — I152 Hypertension secondary to endocrine disorders: Secondary | ICD-10-CM | POA: Diagnosis not present

## 2021-02-06 DIAGNOSIS — E1159 Type 2 diabetes mellitus with other circulatory complications: Secondary | ICD-10-CM

## 2021-02-06 DIAGNOSIS — E785 Hyperlipidemia, unspecified: Secondary | ICD-10-CM

## 2021-02-16 ENCOUNTER — Telehealth: Payer: Self-pay
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
## 2021-02-18 NOTE — Telephone Encounter (Signed)
Encounter entered in error.  Nothing further needed.

## 2021-03-27 ENCOUNTER — Telehealth: Payer: Self-pay

## 2021-03-27 NOTE — Chronic Care Management (AMB) (Signed)
    Chronic Care Management Pharmacy Assistant   Name: BANYAN GOODCHILD  [name]: [id number] DOB: 1951/02/08  Reason for Encounter:    Patient called and informed me that he received a patient assistance form for his Entresto. He states that the form is blank and that he needed some assistance.   I spoke with patient and informed him that I will send out a prefilled form for him. I sent out forms with attached instructions and my contact information if needed.   Andee Poles, CMA

## 2021-04-01 ENCOUNTER — Telehealth: Payer: Self-pay | Admitting: Nurse Practitioner

## 2021-04-01 NOTE — Telephone Encounter (Signed)
Pt dropped off [employer] assistance paperwork to be completed by provider.  Once completed, please contact patient at [phone].  Paperwork needs to be faxed to [phone].  Placed in provider's folder.

## 2021-04-09 NOTE — Telephone Encounter (Signed)
Paperwork placed in provider's bin for review

## 2021-04-09 NOTE — Telephone Encounter (Signed)
Paperwork completed and given to [facility] staff.

## 2021-04-10 ENCOUNTER — Other Ambulatory Visit: Payer: Self-pay

## 2021-04-10 ENCOUNTER — Ambulatory Visit (INDEPENDENT_AMBULATORY_CARE_PROVIDER_SITE_OTHER): Payer: Medicare Other | Admitting: Nurse Practitioner

## 2021-04-10 ENCOUNTER — Encounter: Payer: Self-pay | Admitting: Nurse Practitioner

## 2021-04-10 VITALS — BP 127/77 | HR 77 | Temp 98.0°F | Wt 191.0 lb

## 2021-04-10 DIAGNOSIS — E1122 Type 2 diabetes mellitus with diabetic chronic kidney disease: Secondary | ICD-10-CM | POA: Diagnosis not present

## 2021-04-10 DIAGNOSIS — M1A0720 Idiopathic chronic gout, left ankle and foot, without tophus (tophi): Secondary | ICD-10-CM

## 2021-04-10 DIAGNOSIS — N400 Enlarged prostate without lower urinary tract symptoms: Secondary | ICD-10-CM

## 2021-04-10 DIAGNOSIS — E1169 Type 2 diabetes mellitus with other specified complication: Secondary | ICD-10-CM | POA: Diagnosis not present

## 2021-04-10 DIAGNOSIS — E039 Hypothyroidism, unspecified: Secondary | ICD-10-CM | POA: Diagnosis not present

## 2021-04-10 DIAGNOSIS — I152 Hypertension secondary to endocrine disorders: Secondary | ICD-10-CM | POA: Diagnosis not present

## 2021-04-10 DIAGNOSIS — E785 Hyperlipidemia, unspecified: Secondary | ICD-10-CM

## 2021-04-10 DIAGNOSIS — T466X5A Adverse effect of antihyperlipidemic and antiarteriosclerotic drugs, initial encounter: Secondary | ICD-10-CM

## 2021-04-10 DIAGNOSIS — M791 Myalgia, unspecified site: Secondary | ICD-10-CM | POA: Diagnosis not present

## 2021-04-10 DIAGNOSIS — I429 Cardiomyopathy, unspecified: Secondary | ICD-10-CM | POA: Diagnosis not present

## 2021-04-10 DIAGNOSIS — Z95810 Presence of automatic (implantable) cardiac defibrillator: Secondary | ICD-10-CM

## 2021-04-10 DIAGNOSIS — E1159 Type 2 diabetes mellitus with other circulatory complications: Secondary | ICD-10-CM

## 2021-04-10 DIAGNOSIS — N183 Chronic kidney disease, stage 3 unspecified: Secondary | ICD-10-CM

## 2021-04-10 DIAGNOSIS — I5022 Chronic systolic (congestive) heart failure: Secondary | ICD-10-CM

## 2021-04-10 LAB — BAYER DCA HB A1C WAIVED: HB A1C (BAYER DCA - WAIVED): 5.6 % (ref 4.8–5.6)

## 2021-04-10 NOTE — Progress Notes (Signed)
BP 127/77   Pulse 77   Temp 98 F (36.7 C) (Oral)   Wt 191 lb (86.6 kg)   SpO2 99%   BMI 25.90 kg/m    Subjective:    Patient ID: Ryan [name], male    DOB: May 09, 1951, 70 y.o.   [name]: [id number]  HPI: Ryan [name] is a 70 y.o. male  Chief Complaint  Patient presents with   Diabetes   Hyperlipidemia   Hypertension   Hypothyroidism   Medication Problem    Patient states he has been taking Rybelsus for the past 6 months and he notices an increase in his acid reflux. Patient states he take Dexilant one a day to help. Patient sates he notices it help as much as night and he states he will have a episode around 2 in the morning and he has to prop himself up to help.    DIABETES A1c last visit was 6.2%.  Takes Wilder Glade and Rybelsus, obtaining this through assistance -- is working with CCM team. Had intolerance to Metformin in past. Rybelsus has helped lower A1c, but he does notice heart burn more frequently with this and is taking Dexilant at home.  Notices more at night, especially after pizza -- 10 years had endoscopy, noticed a little scar tissue and was placed on Zantac.  He does endorse sometimes he lays down too soon. Hypoglycemic episodes:no Polydipsia/polyuria: no Visual disturbance: no Chest pain: no Paresthesias: no Glucose Monitoring: no             Accucheck frequency: occasionally             Fasting glucose: 111 -135             Post prandial:             Evening:             Before meals: Taking Insulin?: no             Long acting insulin:             Short acting insulin: Blood Pressure Monitoring: daily Retinal Examination: Up To Date Foot Exam: Up to Date Pneumovax: took several years, refuses Influenza: refused Aspirin: no   CHRONIC KIDNEY DISEASE Last labs CRT 1.24 and GFR 63 -- stable. CKD status: controlled Medications renally dose: yes Previous renal evaluation: no Pneumovax: Up To Date Influenza Vaccine:  Up to Date    HYPOTHYROIDISM Continues Levothyroxine 50 MCG. Thyroid control status:stable Satisfied with current treatment? yes Medication side effects: no Medication compliance: good compliance Etiology of hypothyroidism:  Recent dose adjustment:no Fatigue: no Cold intolerance: no Heat intolerance: no Weight gain: no Weight loss: no Constipation: no Diarrhea/loose stools: no Palpitations: no Lower extremity edema: no Anxiety/depressed mood: no  GOUT Taking Uloric daily. Has not had a gout flare in 10 years. Duration:chronic Right 1st metatarsophalangeal pain: yes Left 1st metatarsophalangeal pain: yes Trauma: no Recent dietary change or indiscretion: no Fevers: no Nausea/vomiting: no Status:  stable  HYPERTENSION / HYPERLIPIDEMIA/HF Followed by cardiology, Dr. Caryl Comes, last saw 02/12/20 -- returns next week.  Continues on Aldactone, Entresto, Coreg.   No current statin as reports cardiology took him off this.  Tried Pravastatin in past with poor response, terrible joint pain.  Has 2 years on battery life for pacemaker.  Had pacer check 01/28/21. Satisfied with current treatment? yes Duration of hypertension: chronic BP monitoring frequency: a few times a week BP range: 117-125/77-83 BP medication side effects: no Duration  of hyperlipidemia: chronic Medication compliance: good compliance Aspirin: no Recent stressors: no Recurrent headaches: no Visual changes: no Palpitations: no Dyspnea: no Chest pain: no Lower extremity edema: no Dizzy/lightheaded: no  The ASCVD Risk score (Arnett DK, et al., 2019) failed to calculate for the following reasons:   The valid total cholesterol range is 130 to 320 mg/dL  Relevant past medical, surgical, family and social history reviewed and updated as indicated. Interim medical history since our last visit reviewed. Allergies and medications reviewed and updated.  Review of Systems  Constitutional:  Negative for activity change, diaphoresis,  fatigue and fever.  Respiratory:  Negative for cough, chest tightness, shortness of breath and wheezing.   Cardiovascular:  Negative for chest pain, palpitations and leg swelling.  Gastrointestinal: Negative.   Endocrine: Negative for polydipsia, polyphagia and polyuria.  Neurological: Negative.   Psychiatric/Behavioral: Negative.     Per HPI unless specifically indicated above     Objective:    BP 127/77   Pulse 77   Temp 98 F (36.7 C) (Oral)   Wt 191 lb (86.6 kg)   SpO2 99%   BMI 25.90 kg/m   Wt Readings from Last 3 Encounters:  04/10/21 191 lb (86.6 kg)  01/09/21 189 lb 3.2 oz (85.8 kg)  09/03/20 200 lb 3.2 oz (90.8 kg)    Physical Exam Vitals and nursing note reviewed.  Constitutional:      General: He is awake. He is not in acute distress.    Appearance: He is well-developed and well-groomed. He is not ill-appearing.  HENT:     Head: Normocephalic and atraumatic.     Right Ear: Hearing normal. No drainage.     Left Ear: Hearing normal. No drainage.  Eyes:     General: Lids are normal.        Right eye: No discharge.        Left eye: No discharge.     Conjunctiva/sclera: Conjunctivae normal.     Pupils: Pupils are equal, round, and reactive to light.  Neck:     Vascular: No carotid bruit.  Cardiovascular:     Rate and Rhythm: Normal rate and regular rhythm.     Heart sounds: Normal heart sounds, S1 normal and S2 normal. No murmur heard.   No gallop.  Pulmonary:     Effort: Pulmonary effort is normal. No accessory muscle usage or respiratory distress.     Breath sounds: Normal breath sounds.  Abdominal:     General: Bowel sounds are normal.     Palpations: Abdomen is soft.  Musculoskeletal:        General: Normal range of motion.     Cervical back: Normal range of motion and neck supple.     Right lower leg: No edema.     Left lower leg: No edema.  Skin:    General: Skin is warm and dry.     Capillary Refill: Capillary refill takes less than 2 seconds.   Neurological:     Mental Status: He is alert and oriented to person, place, and time.  Psychiatric:        Attention and Perception: Attention normal.        Mood and Affect: Mood normal.        Speech: Speech normal.        Behavior: Behavior normal. Behavior is cooperative.        Thought Content: Thought content normal.   Results for orders placed or performed in visit on 01/28/21  CUP PACEART REMOTE DEVICE CHECK  Result Value Ref Range   Date Time Interrogation Session [id number]    Pulse Generator Manufacturer MERM    Pulse Gen Model [id number] Claria MRI Quad CRT-D    Pulse Gen Serial Number [id number]    Clinic Name [facility]    Implantable Pulse Generator Type Cardiac Resynch Therapy Defibulator    Implantable Pulse Generator Implant Date 56433295    Implantable Lead Manufacturer [facility]    Implantable Lead Model 1458Q Quartet    Implantable Lead Serial Number [id number]    Implantable Lead Implant Date 18841660    Implantable Lead Location Detail 1 UNKNOWN    Implantable Lead Location [id number]    Implantable Lead Manufacturer [facility]    Implantable Lead Model [id number] Sprint Quattro Secure    Implantable Lead Serial Number [id number]    Implantable Lead Implant Date 60109323    Implantable Lead Location [id number]    Implantable Lead Manufacturer [employer]M    Implantable Lead Model 5076 CapSureFix Novus    Implantable Lead Serial Number [id number]    Implantable Lead Implant Date 55732202    Implantable Lead Location [id number]    Lead Channel Setting Sensing Sensitivity 0.3 mV   Lead Channel Setting Pacing Amplitude 1.25 V   Lead Channel Setting Pacing Pulse Width 0.4 ms   Lead Channel Setting Pacing Amplitude 1.5 V   Lead Channel Setting Pacing Pulse Width 0.4 ms   Lead Channel Setting Pacing Amplitude 2 V   Lead Channel Setting Pacing Capture Mode Monitor Capture    Lead Channel Impedance Value 399 ohm   Lead Channel Sensing Intrinsic Amplitude 1 mV   Lead Channel Sensing  Intrinsic Amplitude 1 mV   Lead Channel Pacing Threshold Amplitude 0.625 V   Lead Channel Pacing Threshold Pulse Width 0.4 ms   Lead Channel Impedance Value 551 ohm   Lead Channel Impedance Value 475 ohm   Lead Channel Pacing Threshold Amplitude 0.625 V   Lead Channel Pacing Threshold Pulse Width 0.4 ms   HighPow Impedance 48 ohm   HighPow Impedance 59 ohm   Lead Channel Impedance Value 722 ohm   Lead Channel Impedance Value 817 ohm   Lead Channel Impedance Value 836 ohm   Lead Channel Impedance Value 722 ohm   Lead Channel Impedance Value 779 ohm   Lead Channel Impedance Value 760 ohm   Lead Channel Impedance Value 532 ohm   Lead Channel Impedance Value 456 ohm   Lead Channel Impedance Value 456 ohm   Lead Channel Impedance Value 456 ohm   Lead Channel Impedance Value 245.538    Lead Channel Impedance Value 245.538    Lead Channel Impedance Value 245.538    Lead Channel Impedance Value 228 ohm   Lead Channel Impedance Value 228 ohm   Lead Channel Pacing Threshold Amplitude 1.625 V   Lead Channel Pacing Threshold Pulse Width 0.4 ms   Battery Status OK    Battery Remaining Longevity 28 mo   Battery Voltage 2.94 V   Brady Statistic RA Percent Paced 0.7 %   Brady Statistic RV Percent Paced 99.98 %   Brady Statistic AP VP Percent 0.69 %   Brady Statistic AS VP Percent 99.3 %   Brady Statistic AP VS Percent 0.01 %   Brady Statistic AS VS Percent 0 %      Assessment & Plan:   Problem List Items Addressed This Visit       Cardiovascular and Mediastinum   Hypertension associated with diabetes (  [place])    Chronic, stable with BP at goal today and on home readings.  Continue current medication regimen and collaboration with cardiology.  Recommend checking BP at home at least a few mornings a week + focus on DASH diet.  BMP today.  Return in 3 months.      Relevant Orders   Bayer DCA Hb A1c Waived   Basic metabolic panel   Idiopathic cardiomyopathy (HCC)    Ongoing, stable,  followed by cardiology.  Continue this collaboration.      Relevant Orders   Basic metabolic panel   Systolic CHF (HCC)    Chronic, stable.  Euvolemic today.  Continue current medication regimen and collaboration with cardiology.  BMP today.   Recommend: - Reminded to call for an overnight weight gain of >2 pounds or a weekly weight weight of >5 pounds - not adding salt to his food and has been reading food labels. Reviewed the importance of keeping daily sodium intake to 2000mg  daily  - Avoid NSAIDS      Relevant Orders   Basic metabolic panel   Type 2 diabetes mellitus with cardiac complication (HCC) - Primary    Chronic, ongoing with HF.  A1C today 5.6%, downward trend from 6.2% last visit, urine ALB 30 in January 2022.  Continue current medication regimen with Farxiga & Rybelsus 7 MG daily (could consider reduction of this to 3 MG via CCM assistance next visit) + continue collaboration with cardiology.  Recommend he check BS at home at least a few mornings a week with goal fasting <130, bring to next visit.  Continue to collaborate with CCM crew.  Return to office in 3 months.      Relevant Orders   Bayer DCA Hb A1c Waived   Basic metabolic panel     Endocrine   CKD stage 3 due to type 2 diabetes mellitus ([place])    Ongoing, 3a, improved last visit.  Recheck BMP today and monitor closely.  Consider referral to nephrology if worsening.      Relevant Orders   Bayer DCA Hb A1c Waived   Basic metabolic panel   Hyperlipidemia associated with type 2 diabetes mellitus (HCC)    Chronic, ongoing.  No current medication due to poor tolerance.   Would benefit from statin use with diabetes and heart health or consider injectable, discussed with him today.  Lipid panel up to date.  He is agreeable to trial of Rosuvastatin on 3 day or 1 day a week schedule if LDL >70 in future.      Relevant Orders   Bayer DCA Hb A1c Waived   Hypothyroidism    Chronic, ongoing.  Continue current medication  regimen and adjust as needed based on labs.  TSH + antibody and Free T4 today.      Relevant Orders   TSH   T4, free   Thyroid peroxidase antibody     Other   Biventricular implantable cardioverter-defibrillator in situ    Continue collaboration with cardiology and regular checks.      Gout    Chronic, stable.  Continue Uloric and recheck uric acid today.      Relevant Orders   Uric acid   Myalgia due to statin    Due to statin therapy, history of on low doses.  Would benefit from statin use with diabetes and heart health or consider injectable, discussed with him today.         Follow up plan: Return in about 3  months (around 07/09/2021) for T2DM, HTN/HLD, THYROID.

## 2021-04-10 NOTE — Assessment & Plan Note (Signed)
Chronic, stable with BP at goal today and on home readings.  Continue current medication regimen and collaboration with cardiology.  Recommend checking BP at home at least a few mornings a week + focus on DASH diet.  BMP today.  Return in 3 months.

## 2021-04-10 NOTE — Assessment & Plan Note (Signed)
Due to statin therapy, history of on low doses.  Would benefit from statin use with diabetes and heart health or consider injectable, discussed with him today.  

## 2021-04-10 NOTE — Assessment & Plan Note (Signed)
Chronic, stable.  Continue Uloric and recheck uric acid today.

## 2021-04-10 NOTE — Patient Instructions (Signed)

## 2021-04-10 NOTE — Assessment & Plan Note (Addendum)
Chronic, ongoing.  Continue current medication regimen and adjust as needed based on labs.  TSH + antibody and Free T4 today.

## 2021-04-10 NOTE — Assessment & Plan Note (Signed)
Chronic, ongoing.  No current medication due to poor tolerance.   Would benefit from statin use with diabetes and heart health or consider injectable, discussed with him today.  Lipid panel up to date.  He is agreeable to trial of Rosuvastatin on 3 day or 1 day a week schedule if LDL >70 in future.

## 2021-04-10 NOTE — Assessment & Plan Note (Signed)
Ongoing, 3a, improved last visit.  Recheck BMP today and monitor closely.  Consider referral to nephrology if worsening.

## 2021-04-10 NOTE — Assessment & Plan Note (Signed)
Ongoing, stable, followed by cardiology.  Continue this collaboration. 

## 2021-04-10 NOTE — Assessment & Plan Note (Addendum)
Chronic, ongoing with HF.  A1C today 5.6%, downward trend from 6.2% last visit, urine ALB 30 in January 2022.  Continue current medication regimen with Farxiga & Rybelsus 7 MG daily (could consider reduction of this to 3 MG via CCM assistance next visit) + continue collaboration with cardiology.  Recommend he check BS at home at least a few mornings a week with goal fasting <130, bring to next visit.  Continue to collaborate with CCM crew.  Return to office in 3 months.

## 2021-04-10 NOTE — Assessment & Plan Note (Signed)
Continue collaboration with cardiology and regular checks. 

## 2021-04-10 NOTE — Assessment & Plan Note (Signed)
Chronic, stable.  Euvolemic today.  Continue current medication regimen and collaboration with cardiology.  BMP today.   Recommend: - Reminded to call for an overnight weight gain of >2 pounds or a weekly weight weight of >5 pounds - not adding salt to his food and has been reading food labels. Reviewed the importance of keeping daily sodium intake to 2000mg  daily  - Avoid NSAIDS

## 2021-04-11 LAB — BASIC METABOLIC PANEL
BUN/Creatinine Ratio: 14 (ref 10–24)
BUN: 20 mg/dL (ref 8–27)
CO2: 23 mmol/L (ref 20–29)
Calcium: 9.6 mg/dL (ref 8.6–10.2)
Chloride: 102 mmol/L (ref 96–106)
Creatinine, Ser: 1.38 mg/dL — ABNORMAL HIGH (ref 0.76–1.27)
Glucose: 156 mg/dL — ABNORMAL HIGH (ref 70–99)
Potassium: 4.4 mmol/L (ref 3.5–5.2)
Sodium: 140 mmol/L (ref 134–144)
eGFR: 55 mL/min/{1.73_m2} — ABNORMAL LOW (ref >59)

## 2021-04-11 LAB — T4, FREE: Free T4: 1.21 ng/dL (ref 0.82–1.77)

## 2021-04-11 LAB — URIC ACID: Uric Acid: 2.6 mg/dL — ABNORMAL LOW (ref 3.8–8.4)

## 2021-04-11 LAB — THYROID PEROXIDASE ANTIBODY: Thyroperoxidase Ab SerPl-aCnc: <9 IU/mL (ref 0–34)

## 2021-04-11 LAB — TSH: TSH: 3.58 u[IU]/mL (ref 0.450–4.500)

## 2021-04-11 NOTE — Progress Notes (Signed)
Contacted via MyChart   Good morning Ryan [name], your labs have returned: - Kidney function, creatinine and eGFR, shows some mild kidney disease.  This fluctuates with you and we will continue to monitor.  Ensure plenty of water intake at home and avoid Ibuprofen products.  Tylenol is okay. - Thyroid labs are normal -- continue current Levothyroxine dosing. - Uric acid level nice and low -- continue medication.  Any questions? Keep being amazing!!  Thank you for allowing me to participate in your care.  I appreciate you.  Merry Christmas!! Kindest regards, Fallynn Gravett

## 2021-04-13 NOTE — Telephone Encounter (Signed)
If forms are completed and signed by both patient and provider, they can be faxed to [employer]: [phone]

## 2021-04-13 NOTE — Telephone Encounter (Signed)
Forms faxed as advised.

## 2021-04-14 ENCOUNTER — Encounter: Payer: Self-pay | Admitting: Internal Medicine

## 2021-04-14 ENCOUNTER — Other Ambulatory Visit: Payer: Self-pay

## 2021-04-14 ENCOUNTER — Encounter: Payer: Self-pay | Admitting: Nurse Practitioner

## 2021-04-14 ENCOUNTER — Ambulatory Visit (INDEPENDENT_AMBULATORY_CARE_PROVIDER_SITE_OTHER): Payer: Medicare Other | Admitting: Internal Medicine

## 2021-04-14 VITALS — BP 128/80 | HR 84 | Ht 72.0 in | Wt 190.0 lb

## 2021-04-14 DIAGNOSIS — I442 Atrioventricular block, complete: Secondary | ICD-10-CM | POA: Diagnosis not present

## 2021-04-14 DIAGNOSIS — I428 Other cardiomyopathies: Secondary | ICD-10-CM | POA: Diagnosis not present

## 2021-04-14 DIAGNOSIS — I480 Paroxysmal atrial fibrillation: Secondary | ICD-10-CM | POA: Diagnosis not present

## 2021-04-14 DIAGNOSIS — I5022 Chronic systolic (congestive) heart failure: Secondary | ICD-10-CM | POA: Diagnosis not present

## 2021-04-14 DIAGNOSIS — Z95810 Presence of automatic (implantable) cardiac defibrillator: Secondary | ICD-10-CM | POA: Diagnosis not present

## 2021-04-14 DIAGNOSIS — I10 Essential (primary) hypertension: Secondary | ICD-10-CM | POA: Diagnosis not present

## 2021-04-14 NOTE — Patient Instructions (Signed)
Medication Instructions:  - Your physician recommends that you continue on your current medications as directed. Please refer to the Current Medication list given to you today.  *If you need a refill on your cardiac medications before your next appointment, please call your pharmacy*   Lab Work: - none ordered  If you have labs (blood work) drawn today and your tests are completely normal, you will receive your results only by: [place] (if you have MyChart) OR A paper copy in the mail If you have any lab test that is abnormal or we need to change your treatment, we will call you to review the results.   Testing/Procedures: - none ordered   Follow-Up: At [facility], you and your health needs are our priority.  As part of our continuing mission to provide you with exceptional heart care, we have created designated Provider Care Teams.  These Care Teams include your primary Cardiologist (physician) and Advanced Practice Providers (APPs -  Physician Assistants and Nurse Practitioners) who all work together to provide you with the care you need, when you need it.  We recommend signing up for the patient portal called "MyChart".  Sign up information is provided on this After Visit Summary.  MyChart is used to connect with patients for Virtual Visits (Telemedicine).  Patients are able to view lab/test results, encounter notes, upcoming appointments, etc.  Non-urgent messages can be sent to your provider as well.   To learn more about what you can do with MyChart, go to [email or web address].    Your next appointment:   6 month(s)  The format for your next appointment:   In Person  Provider:   Virl Axe, MD    Other Instructions N/a

## 2021-04-14 NOTE — Progress Notes (Signed)
skf      Patient Care Team: Venita Lick, NP as PCP - General (Nurse Practitioner) Carloyn Manner, MD as Referring Physician (Otolaryngology) Deboraha Sprang, MD as Consulting Physician (Cardiology) Vladimir Faster, [facility] (Pharmacist) Vanita Ingles, RN as Case Manager (General Practice)   HPI  Ryan [name] is a 70 y.o. male Seen in followup for an ICD implanted for syncope in the setting of a nonischemic cardiomyopathy. He is now device dependent. He had 6949 lead in place and underwent generator replacement with a new 6947-lead January 2012   Underwent CRT upgrade 3/18   The patient denies chest pain, shortness of breath, nocturnal dyspnea, orthopnea or peripheral edema.  There have been no palpitations, lightheadedness or syncope.     DATE TEST EF   3/14 Echo 40-45 %   2/18 Echo 35-40 %   3/19 Echo   40-45%       Date Cr K Hgb TSH  3/16  1.31 4.3     12/18 1.34 4.6 16.5 5.01  7/19 1.37 4.1    9/20 1.2 4.2 16.3 (1/20) 3.93  7/21 1.37 4.6 16.6 (1/21)   12/22 1.38 4.7          Past Medical History:  Diagnosis Date   AICD (automatic cardioverter/defibrillator) present    Anxiety    Complete heart block ([place])    Depression    Diabetes mellitus, type 2 ([place])    Dual ICD (implantable cardiac defibrillator-Medtronic    a. 6949 implanted originally s/p 6947 lead & generator change out 05/2010; b. upgraded to MDT CRT-D in 07/2016   GERD (gastroesophageal reflux disease)    Gout    History of kidney stones    Hypertension    IBS (irritable bowel syndrome)    Kidney stones    Nonischemic cardiomyopathy ([facility])    a. Echo 07/2012 EF 35-40%, severe HK of the mid-distalanterior myocardium w/ AK of the distalinferior myocardium, GR1DD, mild MR, mild to mod dilated LA; b/ echo 06/2016 EF 35-40%, severe HK of the mid-apicalanteroseptal and anterior myocardium and severe HK of the apicalinferior myocardium along with AK of the apical myocardium, GR1DD, calcified mitral annulus,  mildly dilated left atrium   Paroxysmal atrial fibrillation ([place])    a. CHADS2VASc => 3 (CHF, HTN, age x 1); b. on ASA only   Presence of permanent cardiac pacemaker    Syncope     Past Surgical History:  Procedure Laterality Date   BIV UPGRADE N/A 07/21/2016   Procedure: BiV Upgrade;  Surgeon: Deboraha Sprang, MD;  Location: [facility];  Service: Cardiovascular;  Laterality: N/A;   CARDIAC DEFIBRILLATOR PLACEMENT     DIRECT LARYNGOSCOPY Right 07/14/2017   Procedure: MICRODIRECT LARYNGOSCOPY WITH EXCISION OF RIGHT VOCAL CORD LESION WITH MICRO FLAP TECHNIQUE;  Surgeon: Carloyn Manner, MD;  Location: [facility];  Service: ENT;  Laterality: Right;   LARYNGOSCOPY Right 01/06/2017   Procedure: LARYNGOSCOPY WITH EXCISION OF RIGHT VOCAL CORD LESION;  Surgeon: Carloyn Manner, MD;  Location: [facility];  Service: ENT;  Laterality: Right;   LITHOTRIPSY     UPPER ENDOSCOPY W/ ESOPHAGEAL MANOMETRY      Current Outpatient Medications  Medication Sig Dispense Refill   carvedilol (COREG) 12.5 MG tablet Take 1 tablet (12.5 mg total) by mouth 2 (two) times daily. 180 tablet 4   Cholecalciferol (VITAMIN D3 SUPER STRENGTH) 50 MCG (2000 UT) CAPS Take 2,000 Units by mouth daily.     dapagliflozin propanediol (FARXIGA)  10 MG TABS tablet Take 1 tablet (10 mg total) by mouth daily before breakfast. 90 tablet 4   dexlansoprazole (DEXILANT) 60 MG capsule Take 1 capsule (60 mg total) by mouth daily. 90 capsule 4   febuxostat (ULORIC) 40 MG tablet Take 1 tablet (40 mg total) by mouth daily. 90 tablet 4   levothyroxine (SYNTHROID) 50 MCG tablet Take 1 tablet (50 mcg total) by mouth daily. 90 tablet 4   sacubitril-valsartan (ENTRESTO) 24-26 MG Take 1 tablet by mouth 2 (two) times daily. 180 tablet 4   spironolactone (ALDACTONE) 25 MG tablet Take 1 tablet (25 mg total) by mouth daily. (Patient taking differently: Take 12.5 mg by mouth daily.) 90 tablet 4   Semaglutide (RYBELSUS) 7 MG TABS Take 7 mg by mouth  daily. (Patient not taking: Reported on 04/14/2021) 120 tablet 1   No current facility-administered medications for this visit.    Allergies  Allergen Reactions   Ace Inhibitors     Muscle aches    Crestor [Rosuvastatin Calcium] Other (See Comments)    Muscle aches    Review of Systems negative except from HPI and PMH  Physical Exam BP 128/80 (BP Location: Left Arm, Patient Position: Sitting, Cuff Size: Normal)   Pulse 84   Ht 6' (1.829 m)   Wt 190 lb (86.2 kg)   SpO2 98%   BMI 25.77 kg/m  Well developed and well nourished in no acute distress HENT normal Neck supple with JVP-flat Clear Device pocket well healed; without hematoma or erythema.  There is no tethering  Regular rate and rhythm, no  murmur Abd-soft with active BS No Clubbing cyanosis   edema Skin-warm and dry A & Oriented  Grossly normal sensory and motor function  ECG sinus at 84 with P synchronous pacing  15/17/45 Negative QRS lead I and RS in lead V1  Assessment and  Plan  Nonischemic cardiomyopathy     Complete heart block   Paroxysmal atrial fibrillation    Status post ICD-Medtronic CRT    Chronic systolic heart failure     Hypertension   Renal insufficiency  Gd 2  Blood pressures well controlled.  Continue on his guideline directed therapy for his cardiomyopathy including carvedilol 12.5 twice daily, Entresto 24/26 and spironolactone 12.5 daily.  Euvolemic.  Continue his diuretics as above.  Continue Farxiga 10 mg daily; functionally class II  Electrocardiogram consistent with good resynchronization  No interval atrial fibrillation

## 2021-04-15 ENCOUNTER — Telehealth: Payer: Medicare Other

## 2021-04-15 ENCOUNTER — Ambulatory Visit: Payer: Self-pay

## 2021-04-15 DIAGNOSIS — I152 Hypertension secondary to endocrine disorders: Secondary | ICD-10-CM

## 2021-04-15 DIAGNOSIS — I5022 Chronic systolic (congestive) heart failure: Secondary | ICD-10-CM

## 2021-04-15 DIAGNOSIS — E1159 Type 2 diabetes mellitus with other circulatory complications: Secondary | ICD-10-CM

## 2021-04-15 DIAGNOSIS — E1169 Type 2 diabetes mellitus with other specified complication: Secondary | ICD-10-CM

## 2021-04-15 DIAGNOSIS — E785 Hyperlipidemia, unspecified: Secondary | ICD-10-CM

## 2021-04-15 NOTE — Patient Instructions (Signed)
Visit Information  Thank you for taking time to visit with me today. Please don't hesitate to contact me if I can be of assistance to you before our next scheduled telephone appointment.  Following are the goals we discussed today:  RNCM Clinical Goal(s):  Patient will verbalize basic understanding of CHF, HTN, HLD, and DMII disease process and self health management plan as evidenced by keeping appointments, following plan of care, calling the office for questions or concerns, and working with the pcp and CCM team to optimize the plan of care and effectively manage health and well being  take all medications exactly as prescribed and will call provider for medication related questions as evidenced by compliance with medications and calling for refills before running out of medications     attend all scheduled medical appointments: 07-10-2021 at 0820 with pcp as evidenced by keeping appointments and calling for schedule change needs         demonstrate improved and ongoing adherence to prescribed treatment plan for CHF, HTN, HLD, and DMII as evidenced by stable conditions, seeing specialist as directed, and calling for changes in condition or questions  demonstrate ongoing self health care management ability for effective management of chronic conditions  as evidenced by working with the CCM team through collaboration with Consulting civil engineer, provider, and care team.    Interventions: 1:1 collaboration with primary care provider regarding development and update of comprehensive plan of care as evidenced by provider attestation and co-signature Inter-disciplinary care team collaboration (see longitudinal plan of care) Evaluation of current treatment plan related to  self management and patient's adherence to plan as established by provider     Heart Failure Interventions:  (Status: Goal on Track (progressing): YES.)  Long Term Goal  Basic overview and discussion of pathophysiology of Heart Failure  reviewed Provided education on low sodium diet. 04-15-2021: Review of a heart healthy/ADA diet  Reviewed Heart Failure Action Plan in depth and provided written copy Assessed need for readable accurate scales in home Provided education about placing scale on hard, flat surface Advised patient to weigh each morning after emptying bladder Discussed importance of daily weight and advised patient to weigh and record daily Reviewed role of diuretics in prevention of fluid overload and management of heart failure Discussed the importance of keeping all appointments with provider. Saw pcp on 04-10-2021 and cardiologist on 04-14-2021, sees dentist on 04-16-2021 and dermatologist the following week.  Provided patient with education about the role of exercise in the management of heart failure Advised patient to discuss acute changes of HF with provider Screening for signs and symptoms of depression related to chronic disease state  Assessed social determinant of health barriers EF% 40-45 %   Diabetes:  (Status: Goal on Track (progressing): YES.) Long Term Goal         Lab Results  Component Value Date    HGBA1C 5.6 04/10/2021  Assessed patient's understanding of A1c goal: <7% Provided education to patient about basic DM disease process; Reviewed medications with patient and discussed importance of medication adherence. 04-15-2021: The patient sent a my chart message to the pcp on 04-14-2021.  The patient has been having increased heartburn and GI symptoms for over a week and he feels like it is caused by the Rybelsus. The patient stopped taking and has not taken for 2 days. He feels much better and was able to sleep last night. Per pcp note monitoring will continue and a new A1C will be obtained at March  visit. Reviewed with the patient to call the office for questions or concerns or consistently low blood sugars or high blood sugars. The patient has a good understanding of effective management of his DM.  Verbalized understanding. Wanted to be able to take the Rybelsus but feels this was causing him to have more issues. Emotional listening and support given. ;        Reviewed prescribed diet with patient heart healthy/ADA diet. 04-16-2021: Review and support given ; Counseled on importance of regular laboratory monitoring as prescribed;        Discussed plans with patient for ongoing care management follow up and provided patient with direct contact information for care management team;      Provided patient with written educational materials related to hypo and hyperglycemia and importance of correct treatment. 04-15-2021: The patient denies any lows at this time.        Reviewed scheduled/upcoming provider appointments including: 07-10-2021 at 0820 am;         Advised patient, providing education and rationale, to check cbg when you have symptoms of low or high blood sugar, before and after exercise, and as directed by the pcp  and record . 04-15-2021: The patient states he is getting readings of 111 to 130, denies and extreme lows or highs. Will continue to monitor.       call provider for findings outside established parameters;       Review of patient status, including review of consultants reports, relevant laboratory and other test results, and medications completed;       Advised patient to discuss questions or concerns  with provider;        Hyperlipidemia:  (Status: Goal on Track (progressing): YES.) Long Term Goal       Lab Results  Component Value Date    CHOL 128 01/09/2021    HDL 34 (L) 01/09/2021    LDLCALC 68 01/09/2021    TRIG 146 01/09/2021    CHOLHDL 4.0 05/24/2018      Medication review performed; medication list updated in electronic medical record.  Provider established cholesterol goals reviewed; Counseled on importance of regular laboratory monitoring as prescribed; Provided HLD educational materials; Reviewed role and benefits of statin for ASCVD risk  reduction; Discussed strategies to manage statin-induced myalgias; Reviewed importance of limiting foods high in cholesterol; Reviewed exercise goals and target of 150 minutes per week;   Hypertension: (Status: Goal on Track (progressing): YES.) Last practice recorded BP readings:     BP Readings from Last 3 Encounters:  04/14/21 128/80  04/10/21 127/77  01/09/21 130/82  Most recent eGFR/CrCl:       Lab Results  Component Value Date    EGFR 55 (L) 04/10/2021    No components found for: CRCL   Evaluation of current treatment plan related to hypertension self management and patient's adherence to plan as established by provider;   Provided education to patient re: stroke prevention, s/s of heart attack and stroke; Reviewed prescribed diet heart healthy/ADA diet  Reviewed medications with patient and discussed importance of compliance;  Discussed plans with patient for ongoing care management follow up and provided patient with direct contact information for care management team; Advised patient, providing education and rationale, to monitor blood pressure daily and record, calling PCP for findings outside established parameters;  Advised patient to discuss blood pressure trends or changes in heart health with provider; Provided education on prescribed diet heart healthy/ADA diet ;  Discussed complications of poorly controlled blood  pressure such as heart disease, stroke, circulatory complications, vision complications, kidney impairment, sexual dysfunction;    Patient Goals/Self-Care Activities: Take medications as prescribed   Attend all scheduled provider appointments Call pharmacy for medication refills 3-7 days in advance of running out of medications Attend church or other social activities Perform all self care activities independently  Perform IADL's (shopping, preparing meals, housekeeping, managing finances) independently Call provider office for new concerns or questions   Work with the social worker to address care coordination needs and will continue to work with the clinical team to address health care and disease management related needs call the Suicide and Crisis Lifeline: 988 call the [place] National Suicide Prevention Lifeline: 623-001-2602 or TTY: 224-383-4497 TTY 916-024-0044) to talk to a trained counselor call 1-800-273-TALK (toll free, 24 hour hotline) if experiencing a Mental Health or [place]  call office if I gain more than 2 pounds in one day or 5 pounds in one week keep legs up while sitting track weight in diary use salt in moderation watch for swelling in feet, ankles and legs every day weigh myself daily develop a rescue plan follow rescue plan if symptoms flare-up eat more whole grains, fruits and vegetables, lean meats and healthy fats know when to call the doctorfor changes in sx and sx of CHF to prevent exacerbation track symptoms and what helps feel better or worse dress right for the weather, hot or cold schedule appointment with eye doctor- Last eye exam January 2022 will call for appointment in new year check blood sugar at prescribed times: when you have symptoms of low or high blood sugar, before and after exercise, and as directed   check feet daily for cuts, sores or redness enter blood sugar readings and medication or insulin into daily log take the blood sugar log to all doctor visits trim toenails straight across drink 6 to 8 glasses of water each day eat fish at least once per week fill half of plate with vegetables limit fast food meals to no more than 1 per week manage portion size prepare main meal at home 3 to 5 days each week read food labels for fat, fiber, carbohydrates and portion size reduce red meat to 2 to 3 times a week set a realistic goal keep feet up while sitting wash and dry feet carefully every day wear comfortable, cotton socks wear comfortable, well-fitting shoes check blood  pressure 3 times per week choose a place to take my blood pressure (home, clinic or office, retail store) write blood pressure results in a log or diary learn about high blood pressure keep a blood pressure log take blood pressure log to all doctor appointments call doctor for signs and symptoms of high blood pressure develop an action plan for high blood pressure keep all doctor appointments take medications for blood pressure exactly as prescribed report new symptoms to your doctor eat more whole grains, fruits and vegetables, lean meats and healthy fats - call for medicine refill 2 or 3 days before it runs out - take all medications exactly as prescribed - call doctor with any symptoms you believe are related to your medicine - call doctor when you experience any new symptoms - go to all doctor appointments as scheduled - adhere to prescribed diet: heart healthy/ADA diet     Our next appointment is by telephone on 06-16-2021 at 0900 am  Please call the care guide team at [phone] if you need to cancel or reschedule your appointment.  If you are experiencing a Mental Health or [place] or need someone to talk to, please call the Suicide and Crisis Lifeline: 988 call the [place] National Suicide Prevention Lifeline: 540-437-4985 or TTY: (647)328-9171 TTY 7016250063) to talk to a trained counselor call 1-800-273-TALK (toll free, 24 hour hotline)   Patient verbalizes understanding of instructions provided today and agrees to view in [place].   Noreene Larsson RN, MSN, [facility] Mobile: [phone]

## 2021-04-15 NOTE — Chronic Care Management (AMB) (Signed)
Chronic Care Management   CCM RN Visit Note  04/15/2021 Name: Ryan [name] [name]: [id number] DOB: Oct 16, 1950  Subjective: Ryan [name] is a 70 y.o. year old male who is a primary care patient of Cannady, Barbaraann Faster, NP. The care management team was consulted for assistance with disease management and care coordination needs.    Engaged with patient by telephone for follow up visit in response to provider referral for case management and/or care coordination services.   Consent to Services:  The patient was given information about Chronic Care Management services, agreed to services, and gave verbal consent prior to initiation of services.  Please see initial visit note for detailed documentation.   Patient agreed to services and verbal consent obtained.   Assessment: Review of patient past medical history, allergies, medications, health status, including review of consultants reports, laboratory and other test data, was performed as part of comprehensive evaluation and provision of chronic care management services.   SDOH (Social Determinants of Health) assessments and interventions performed:  SDOH Interventions    Flowsheet Row Most Recent Value  SDOH Interventions   Transportation Interventions Intervention Not Indicated        CCM Care Plan  Allergies  Allergen Reactions   Ace Inhibitors     Muscle aches    Crestor [Rosuvastatin Calcium] Other (See Comments)    Muscle aches    Outpatient Encounter Medications as of 04/15/2021  Medication Sig   dapagliflozin propanediol (FARXIGA) 10 MG TABS tablet Take 1 tablet (10 mg total) by mouth daily before breakfast.   carvedilol (COREG) 12.5 MG tablet Take 1 tablet (12.5 mg total) by mouth 2 (two) times daily.   Cholecalciferol (VITAMIN D3 SUPER STRENGTH) 50 MCG (2000 UT) CAPS Take 2,000 Units by mouth daily.   dexlansoprazole (DEXILANT) 60 MG capsule Take 1 capsule (60 mg total) by mouth daily.   febuxostat (ULORIC) 40 MG tablet  Take 1 tablet (40 mg total) by mouth daily.   levothyroxine (SYNTHROID) 50 MCG tablet Take 1 tablet (50 mcg total) by mouth daily.   sacubitril-valsartan (ENTRESTO) 24-26 MG Take 1 tablet by mouth 2 (two) times daily.   Semaglutide (RYBELSUS) 7 MG TABS Take 7 mg by mouth daily. (Patient not taking: Reported on 04/14/2021)   spironolactone (ALDACTONE) 25 MG tablet Take 1 tablet (25 mg total) by mouth daily. (Patient taking differently: Take 12.5 mg by mouth daily.)   No facility-administered encounter medications on file as of 04/15/2021.    Patient Active Problem List   Diagnosis Date Noted   CKD stage 3 due to type 2 diabetes mellitus ([place]) 08/30/2019   Type 2 diabetes mellitus with cardiac complication ([place]) 75/91/6384   Hypothyroidism 05/26/2019   Advanced care planning/counseling discussion 66/59/9357   Systolic CHF ([place]) 01/77/9390   BPH (benign prostatic hyperplasia) 04/14/2016   Hypertension associated with diabetes ([place]) 12/16/2014   Gout 12/16/2014   Hyperlipidemia associated with type 2 diabetes mellitus ([place]) 06/13/2012   Biventricular implantable cardioverter-defibrillator in situ 09/28/2010   Idiopathic cardiomyopathy ([place]) 01/27/2009   Myalgia due to statin 01/27/2009    Conditions to be addressed/monitored:CHF, HTN, HLD, and DMII  Care Plan : RNCM: Diabetes Type 2 (Adult)  Updates made by Vanita Ingles, RN since 04/15/2021 12:00 AM  Completed 04/15/2021   Problem: Glycemic Management (Diabetes, Type 2) Resolved 04/15/2021     Long-Range Goal: RNCM: DM care plan Completed 04/15/2021  Start Date: 04/15/2020  Expected End Date: 09/29/2021  Recent Progress: On track  Priority: High  Note:   Objective: Resolving, duplicate goal  Lab Results  Component Value Date   HGBA1C 6.2 (H) 01/09/2021    In the office at today's visit 09-03-2020: HGBA1C was 7.8% Lab Results  Component Value Date   CREATININE 1.24 01/09/2021   CREATININE 1.26 09/03/2020   CREATININE 1.39 (H)  06/04/2020   No results found for: EGFR Current Barriers:  Knowledge Deficits related to basic Diabetes pathophysiology and self care/management Knowledge Deficits related to medications used for management of diabetes Difficulty obtaining or cannot afford medications Does not use cbg meter  Unable to independently manage DM as evidence of hemoglobin A1C of 7.8% (09-03-2020), newest A1C 6.2 on 01-09-2021 Case Manager Clinical Goal(s):  patient will demonstrate improved adherence to prescribed treatment plan for diabetes self care/management as evidenced by: monitoring blood sugar per provider recommendations, adhering to a heart healthy/ADA diet and maintaining hemoglobin A1C of 7.0 or less daily monitoring and recording of CBG- the patient does not check blood sugars daily. Education and support. Will collaborate with pcp to see what expectations of checking blood sugars are adherence to ADA/ carb modified diet- 09-03-2020: Extensive education given to the patient today about the need to monitor dietary intake due to steady incline in hemoglobin A1C. The patient is upset with himself for poor dietary choices. States he and his wife just got back from the beach. He wants to do better. Empathetic listening and support.  exercise 4/5 days/week adherence to prescribed medication regimen.   Interventions:  Provided education to patient about basic DM disease process. 12-02-2020: Reviewed with the patient the importance of checking blood sugars regularly and writing down. Encouraged the patient to bring readings to the pcp appointment for review and recommendations. Praised the patient for consistently taking his blood sugars. Ask the patient to continue to check blood sugars on a consistent basis and ask pcp recommendations on how often he should check his blood sugars.  -Reviewed medications with patient and discussed importance of medication adherence. 09-03-2020: Will collaborate with pcp and pharm D on  the patient not being able to afford the Rybelsus. 02-04-2021: Patient states that he is compliant with his medications and taking as directed.  Discussed plans with patient for ongoing care management follow up and provided patient with direct contact information for care management team Provided patient with written educational materials related to hypo and hyperglycemia and importance of correct treatment. Discussed the importance of recognition of hypo and hyperglycemia and what sx and sx to look for. The patient does not check blood sugars. He denies any episodes of hypo and hyperglycemia.  Will continue to monitor and educate accordingly. 02-04-2021: The patient states that he has not had any lows. States blood sugars have been in good range.  Reviewed scheduled/upcoming provider appointments including:  04-10-2021 at 0920 am Review of patient status, including review of consultants reports, relevant laboratory and other test results, and medications completed. - barriers to adherence to treatment plan  am identified - blood glucose monitoring encouraged at  - mutual A1C goal set or reviewed- 09-03-2020: Goal of hemoglobin A1C is 7 - resources required to improve adherence to care identified. 09-03-2020: New medication started today Rybelsus. The patient states he went to the pharmacy after his visit and after insurance his cost is $700.00. The patient states he can not afford this and will need other options. He told the pcp this. Working with the pharm D to see if there is financial assistance for the  patient.  Will continue to monitor. 02-04-2021: The patient is happy that he got his medications and is taking his medications as directed.  - self-awareness of signs/symptoms of hypo or hyperglycemia encouraged-- Review of sx and sx to monitor for with hypo  and hyperglycemia. 12-02-2020: the patient states he has not had any lows. Recommended if he had any sx and sx of low blood sugar to check his blood  sugars for a reference point. Agrees to this.  Reviewed of goal of hemoglobin A1C of 7.0 or less and that the average blood sugar to achieve a 7.0 is 154. The patient is confident that his A1C will be less than 7.0 on next MD visit. Praised the patient for progress and changes in DM management. Will continue to monitor. 02-04-2021: The patient praised for getting A1C down to 6.2 at recent check on 01-09-2021: The patient states that he is hoping to get it lower. The patient is going on a trip to [place] soon with his wife and he is excited. Discussed taking all medications, adherence to dietary restrictions and monitoring for changes in conditions. The patient denies any acute needs. Will continue to monitor.  -Patient Goals/Self-Care Activities  patient will:  - UNABLE to independently manage DM as evidence of hemoglobin A1C elevated Checks blood sugars as prescribed and utilize hyper and hypoglycemia protocol as needed Adheres to prescribed ADA/carb modified Follow Up Plan: Telephone follow up appointment with care management team member scheduled for:04-15-2021 at 0930 am     Care Plan : RNCM: Hypertension (Adult) and HLD  Updates made by Vanita Ingles, RN since 04/15/2021 12:00 AM  Completed 04/15/2021   Problem: Hypertension (Hypertension) Resolved 04/15/2021     Long-Range Goal: RNCM: Management of Hypertension/HLD Completed 04/15/2021  Start Date: 09/03/2020  Expected End Date: 10/05/2021  Recent Progress: On track  Priority: Medium  Note:   Resolving, duplicate goal   BP Readings from Last 3 Encounters:  01/09/21 130/82  09/03/20 135/84  06/04/20 132/85    Lab Results  Component Value Date   CHOL 128 01/09/2021   CHOL 140 09/03/2020   CHOL 147 06/04/2020   Lab Results  Component Value Date   HDL 34 (L) 01/09/2021   HDL 30 (L) 09/03/2020   HDL 33 (L) 06/04/2020   Lab Results  Component Value Date   LDLCALC 68 01/09/2021   LDLCALC 68 09/03/2020   LDLCALC 74 06/04/2020    Lab Results  Component Value Date   TRIG 146 01/09/2021   TRIG 262 (H) 09/03/2020   TRIG 242 (H) 06/04/2020   Lab Results  Component Value Date   CHOLHDL 4.0 05/24/2018   CHOLHDL 4.5 04/14/2016  No results found for: LDLDIRECT  Current Barriers:  Chronic Disease Management support and education needs related to HTN and HLD  Nurse Case Manager Clinical Goal(s):   patient will verbalize understanding of plan for HTN and HLD  patient will meet with RN Care Manager to address barriers to managing HTN and HLD  patient will attend all scheduled medical appointments: next appointment with pcp 04-10-2021 at 0920 am patient will demonstrate improved adherence to prescribed treatment plan for HTN and HLD as evidenced bynormal range  Interventions:  Inter-disciplinary care team collaboration (see longitudinal plan of care) Evaluation of current treatment plan related to HTN and HLD and patient's adherence to plan as established by provider. 09-03-2020: The patient saw pcp today. Labwork done. The pcp wants the patient to start Rosuvastatin 3 mg daily due  to intolerance of statins in the past. The patient agrees to this. The patient is hopeful that with dietary changes his Triglycerides are better; however is willing to try the medications. 12-02-2020: The patient is doing well with management of his HTN and HLD. His blood pressure reading today is 132/75 and HR 69. The patient is taking his blood pressure on a consistent basis. States that he is monitoring what he eats and being health minded. Will continue to monitor. 02-04-2021: The patient is doing well with management of his HTN and HLD. The patient seen in the office recently and labs were at goal. The patient denies any acute findings. States that he is feeling great. Preparing for an upcoming trip to [place] with his wife. Will continue to monitor.  Advised patient to call for changes in blood pressure or sx/sx of worsening conditions. 02-04-2021:  Advised the patient of increase risk of heart attack and stroke with elevated cholesterol levels and blood pressure. Provided education to patient re: sx and sx of HTN, following a heart healthy diet, adequate exercise. 09-03-2020: Review and education given on dietary restrictions. Will send educational material by my chart system on diabetic and DASH diets. 02-04-2021: Review of heart health and will continue to work with the patient for changes in his condition. Denies any acute concerns at this time. Discussed the importance of following heart health/ADA diet especially when he is on vacation and is subject to eat foods that are not healthy.  Reviewed medications with patient and discussed compliance. 09-03-2020: The patient is compliant with medications. Low tolerance in the past to statins but is willing to try low dose of Rosuvastatin. 02-04-2021: States compliance with medications, no new needs at this time.  Discussed plans with patient for ongoing care management follow up and provided patient with direct contact information for care management team Reviewed scheduled/upcoming provider appointments including: 04-10-2021 at 0920 am. Reminded the patient about AWV with nurse today by telephone. Will continue to monitor.   Patient Self Care Activities:  Patient will self administer medications as prescribed Patient will attend all scheduled provider appointments Patient will call pharmacy for medication refills Patient will call provider office for new concerns or questions Unable to independently manage HTN and HLD as evidence of unable to take statins to manage HLD  Follow up with the patient on 04-15-2021 at 930 am    Care Plan : RNCM: General Plan of Care (Adult) for Chronic Disease Management and Care Coordination Needs  Updates made by Vanita Ingles, RN since 04/15/2021 12:00 AM     Problem: RNCM: Development of Plan of Care for Chronic Disease Management (CHF, HTN, HLD, DM)   Priority:  High     Long-Range Goal: RNCM: Effective Management  of Plan of Care for Chronic Disease Management (CHF, HTN, HLD, DM)   Priority: High  Note:   Current Barriers:  Knowledge Deficits related to plan of care for management of CHF, HTN, HLD, and DMII  Chronic Disease Management support and education needs related to CHF, HTN, HLD, and DMII  RNCM Clinical Goal(s):  Patient will verbalize basic understanding of CHF, HTN, HLD, and DMII disease process and self health management plan as evidenced by keeping appointments, following plan of care, calling the office for questions or concerns, and working with the pcp and CCM team to optimize the plan of care and effectively manage health and well being  take all medications exactly as prescribed and will call provider for medication related  questions as evidenced by compliance with medications and calling for refills before running out of medications     attend all scheduled medical appointments: 07-10-2021 at 0820 with pcp as evidenced by keeping appointments and calling for schedule change needs         demonstrate improved and ongoing adherence to prescribed treatment plan for CHF, HTN, HLD, and DMII as evidenced by stable conditions, seeing specialist as directed, and calling for changes in condition or questions  demonstrate ongoing self health care management ability for effective management of chronic conditions  as evidenced by working with the CCM team through collaboration with Consulting civil engineer, provider, and care team.   Interventions: 1:1 collaboration with primary care provider regarding development and update of comprehensive plan of care as evidenced by provider attestation and co-signature Inter-disciplinary care team collaboration (see longitudinal plan of care) Evaluation of current treatment plan related to  self management and patient's adherence to plan as established by provider   Heart Failure Interventions:  (Status: Goal on  Track (progressing): YES.)  Long Term Goal  Basic overview and discussion of pathophysiology of Heart Failure reviewed Provided education on low sodium diet. 04-15-2021: Review of a heart healthy/ADA diet  Reviewed Heart Failure Action Plan in depth and provided written copy Assessed need for readable accurate scales in home Provided education about placing scale on hard, flat surface Advised patient to weigh each morning after emptying bladder Discussed importance of daily weight and advised patient to weigh and record daily Reviewed role of diuretics in prevention of fluid overload and management of heart failure Discussed the importance of keeping all appointments with provider. Saw pcp on 04-10-2021 and cardiologist on 04-14-2021, sees dentist on 04-16-2021 and dermatologist the following week.  Provided patient with education about the role of exercise in the management of heart failure Advised patient to discuss acute changes of HF with provider Screening for signs and symptoms of depression related to chronic disease state  Assessed social determinant of health barriers EF% 40-45 %  Diabetes:  (Status: Goal on Track (progressing): YES.) Long Term Goal   Lab Results  Component Value Date   HGBA1C 5.6 04/10/2021  Assessed patient's understanding of A1c goal: <7% Provided education to patient about basic DM disease process; Reviewed medications with patient and discussed importance of medication adherence. 04-15-2021: The patient sent a my chart message to the pcp on 04-14-2021.  The patient has been having increased heartburn and GI symptoms for over a week and he feels like it is caused by the Rybelsus. The patient stopped taking and has not taken for 2 days. He feels much better and was able to sleep last night. Per pcp note monitoring will continue and a new A1C will be obtained at March visit. Reviewed with the patient to call the office for questions or concerns or consistently low blood  sugars or high blood sugars. The patient has a good understanding of effective management of his DM. Verbalized understanding. Wanted to be able to take the Rybelsus but feels this was causing him to have more issues. Emotional listening and support given. ;        Reviewed prescribed diet with patient heart healthy/ADA diet. 04-16-2021: Review and support given ; Counseled on importance of regular laboratory monitoring as prescribed;        Discussed plans with patient for ongoing care management follow up and provided patient with direct contact information for care management team;      Provided patient  with written educational materials related to hypo and hyperglycemia and importance of correct treatment. 04-15-2021: The patient denies any lows at this time.        Reviewed scheduled/upcoming provider appointments including: 07-10-2021 at 0820 am;         Advised patient, providing education and rationale, to check cbg when you have symptoms of low or high blood sugar, before and after exercise, and as directed by the pcp  and record . 04-15-2021: The patient states he is getting readings of 111 to 130, denies and extreme lows or highs. Will continue to monitor.       call provider for findings outside established parameters;       Review of patient status, including review of consultants reports, relevant laboratory and other test results, and medications completed;       Advised patient to discuss questions or concerns  with provider;       Hyperlipidemia:  (Status: Goal on Track (progressing): YES.) Long Term Goal  Lab Results  Component Value Date   CHOL 128 01/09/2021   HDL 34 (L) 01/09/2021   LDLCALC 68 01/09/2021   TRIG 146 01/09/2021   CHOLHDL 4.0 05/24/2018     Medication review performed; medication list updated in electronic medical record.  Provider established cholesterol goals reviewed; Counseled on importance of regular laboratory monitoring as prescribed; Provided HLD  educational materials; Reviewed role and benefits of statin for ASCVD risk reduction; Discussed strategies to manage statin-induced myalgias; Reviewed importance of limiting foods high in cholesterol; Reviewed exercise goals and target of 150 minutes per week;  Hypertension: (Status: Goal on Track (progressing): YES.) Last practice recorded BP readings:  BP Readings from Last 3 Encounters:  04/14/21 128/80  04/10/21 127/77  01/09/21 130/82  Most recent eGFR/CrCl:  Lab Results  Component Value Date   EGFR 55 (L) 04/10/2021    No components found for: CRCL  Evaluation of current treatment plan related to hypertension self management and patient's adherence to plan as established by provider;   Provided education to patient re: stroke prevention, s/s of heart attack and stroke; Reviewed prescribed diet heart healthy/ADA diet  Reviewed medications with patient and discussed importance of compliance;  Discussed plans with patient for ongoing care management follow up and provided patient with direct contact information for care management team; Advised patient, providing education and rationale, to monitor blood pressure daily and record, calling PCP for findings outside established parameters;  Advised patient to discuss blood pressure trends or changes in heart health with provider; Provided education on prescribed diet heart healthy/ADA diet ;  Discussed complications of poorly controlled blood pressure such as heart disease, stroke, circulatory complications, vision complications, kidney impairment, sexual dysfunction;   Patient Goals/Self-Care Activities: Take medications as prescribed   Attend all scheduled provider appointments Call pharmacy for medication refills 3-7 days in advance of running out of medications Attend church or other social activities Perform all self care activities independently  Perform IADL's (shopping, preparing meals, housekeeping, managing finances)  independently Call provider office for new concerns or questions  Work with the social worker to address care coordination needs and will continue to work with the clinical team to address health care and disease management related needs call the Suicide and Crisis Lifeline: 988 call the [place] National Suicide Prevention Lifeline: 5178093884 or TTY: 979-631-3126 TTY (684)006-9208) to talk to a trained counselor call 1-800-273-TALK (toll free, 24 hour hotline) if experiencing a Mental Health or [place]  call office  if I gain more than 2 pounds in one day or 5 pounds in one week keep legs up while sitting track weight in diary use salt in moderation watch for swelling in feet, ankles and legs every day weigh myself daily develop a rescue plan follow rescue plan if symptoms flare-up eat more whole grains, fruits and vegetables, lean meats and healthy fats know when to call the doctorfor changes in sx and sx of CHF to prevent exacerbation track symptoms and what helps feel better or worse dress right for the weather, hot or cold schedule appointment with eye doctor- Last eye exam January 2022 will call for appointment in new year check blood sugar at prescribed times: when you have symptoms of low or high blood sugar, before and after exercise, and as directed   check feet daily for cuts, sores or redness enter blood sugar readings and medication or insulin into daily log take the blood sugar log to all doctor visits trim toenails straight across drink 6 to 8 glasses of water each day eat fish at least once per week fill half of plate with vegetables limit fast food meals to no more than 1 per week manage portion size prepare main meal at home 3 to 5 days each week read food labels for fat, fiber, carbohydrates and portion size reduce red meat to 2 to 3 times a week set a realistic goal keep feet up while sitting wash and dry feet carefully every day wear  comfortable, cotton socks wear comfortable, well-fitting shoes check blood pressure 3 times per week choose a place to take my blood pressure (home, clinic or office, retail store) write blood pressure results in a log or diary learn about high blood pressure keep a blood pressure log take blood pressure log to all doctor appointments call doctor for signs and symptoms of high blood pressure develop an action plan for high blood pressure keep all doctor appointments take medications for blood pressure exactly as prescribed report new symptoms to your doctor eat more whole grains, fruits and vegetables, lean meats and healthy fats - call for medicine refill 2 or 3 days before it runs out - take all medications exactly as prescribed - call doctor with any symptoms you believe are related to your medicine - call doctor when you experience any new symptoms - go to all doctor appointments as scheduled - adhere to prescribed diet: heart healthy/ADA diet        Plan:Telephone follow up appointment with care management team member scheduled for:  06-16-2021 at 0900 am   [place], MSN, [facility] Mobile: [phone]

## 2021-04-16 LAB — CUP PACEART INCLINIC DEVICE CHECK
Battery Remaining Longevity: 27 mo
Battery Voltage: 2.93 V
Brady Statistic AP VP Percent: 5.28 %
Brady Statistic AP VS Percent: 0.01 %
Brady Statistic AS VP Percent: 94.72 %
Brady Statistic AS VS Percent: 0 %
Brady Statistic RA Percent Paced: 5.28 %
Brady Statistic RV Percent Paced: 99.98 %
Date Time Interrogation Session: 20221206104000
HighPow Impedance: 50 Ohm
HighPow Impedance: 64 Ohm
Implantable Lead Implant Date: 20060505
Implantable Lead Implant Date: 20120111
Implantable Lead Implant Date: 20180314
Implantable Lead Location: 753858
Implantable Lead Location: 753859
Implantable Lead Location: 753860
Implantable Lead Model: 5076
Implantable Lead Model: 6947
Implantable Pulse Generator Implant Date: 20180314
Lead Channel Impedance Value: 222.34 Ohm
Lead Channel Impedance Value: 232.653
Lead Channel Impedance Value: 234.08 Ohm
Lead Channel Impedance Value: 245.538
Lead Channel Impedance Value: 250.943
Lead Channel Impedance Value: 418 Ohm
Lead Channel Impedance Value: 456 Ohm
Lead Channel Impedance Value: 456 Ohm
Lead Channel Impedance Value: 475 Ohm
Lead Channel Impedance Value: 513 Ohm
Lead Channel Impedance Value: 532 Ohm
Lead Channel Impedance Value: 608 Ohm
Lead Channel Impedance Value: 703 Ohm
Lead Channel Impedance Value: 760 Ohm
Lead Channel Impedance Value: 760 Ohm
Lead Channel Impedance Value: 779 Ohm
Lead Channel Impedance Value: 817 Ohm
Lead Channel Impedance Value: 817 Ohm
Lead Channel Pacing Threshold Amplitude: 0.5 V
Lead Channel Pacing Threshold Amplitude: 0.75 V
Lead Channel Pacing Threshold Amplitude: 1 V
Lead Channel Pacing Threshold Pulse Width: 0.4 ms
Lead Channel Pacing Threshold Pulse Width: 0.4 ms
Lead Channel Pacing Threshold Pulse Width: 0.4 ms
Lead Channel Sensing Intrinsic Amplitude: 1.25 mV
Lead Channel Sensing Intrinsic Amplitude: 2.5 mV
Lead Channel Setting Pacing Amplitude: 1.25 V
Lead Channel Setting Pacing Amplitude: 1.5 V
Lead Channel Setting Pacing Amplitude: 2 V
Lead Channel Setting Pacing Pulse Width: 0.4 ms
Lead Channel Setting Pacing Pulse Width: 0.4 ms
Lead Channel Setting Sensing Sensitivity: 0.3 mV

## 2021-04-23 ENCOUNTER — Other Ambulatory Visit: Payer: Self-pay

## 2021-04-23 ENCOUNTER — Ambulatory Visit (INDEPENDENT_AMBULATORY_CARE_PROVIDER_SITE_OTHER): Payer: Medicare Other | Admitting: Dermatology

## 2021-04-23 DIAGNOSIS — L814 Other melanin hyperpigmentation: Secondary | ICD-10-CM

## 2021-04-23 DIAGNOSIS — Z1283 Encounter for screening for malignant neoplasm of skin: Secondary | ICD-10-CM | POA: Diagnosis not present

## 2021-04-23 DIAGNOSIS — L820 Inflamed seborrheic keratosis: Secondary | ICD-10-CM

## 2021-04-23 DIAGNOSIS — L578 Other skin changes due to chronic exposure to nonionizing radiation: Secondary | ICD-10-CM | POA: Diagnosis not present

## 2021-04-23 DIAGNOSIS — L821 Other seborrheic keratosis: Secondary | ICD-10-CM | POA: Diagnosis not present

## 2021-04-23 DIAGNOSIS — D1800 Hemangioma unspecified site: Secondary | ICD-10-CM

## 2021-04-23 DIAGNOSIS — D229 Melanocytic nevi, unspecified: Secondary | ICD-10-CM

## 2021-04-23 NOTE — Patient Instructions (Addendum)

## 2021-04-23 NOTE — Progress Notes (Signed)
° °  Follow-Up Visit   Subjective  Ryan [name] is a 70 y.o. male who presents for the following: Annual Exam (Mole check ). The patient presents for Total-Body Skin Exam (TBSE) for skin cancer screening and mole check.  The patient has spots, moles and lesions to be evaluated, some may be new or changing and the patient has concerns that these could be cancer.   The following portions of the chart were reviewed this encounter and updated as appropriate:   Tobacco   Allergies   Meds   Problems   Med Hx   Surg Hx   Fam Hx      Review of Systems:  No other skin or systemic complaints except as noted in HPI or Assessment and Plan.  Objective  Well appearing patient in no apparent distress; mood and affect are within normal limits.  A full examination was performed including scalp, head, eyes, ears, nose, lips, neck, chest, axillae, abdomen, back, buttocks, bilateral upper extremities, bilateral lower extremities, hands, feet, fingers, toes, fingernails, and toenails. All findings within normal limits unless otherwise noted below.  Scalp x3, back x 31,   anterior waistline x 5, legs x 9, (48) Stuck-on, waxy, tan-brown papules   Assessment & Plan  Inflamed seborrheic keratosis (48) Scalp x3, back x 31,   anterior waistline x 5, legs x 9,  Destruction of lesion - Scalp x3, back x 31,   anterior waistline x 5, legs x 9, Complexity: simple   Destruction method: cryotherapy   Informed consent: discussed and consent obtained   Timeout:  patient name, date of birth, surgical site, and procedure verified Lesion destroyed using liquid nitrogen: Yes   Region frozen until ice ball extended beyond lesion: Yes   Outcome: patient tolerated procedure well with no complications   Post-procedure details: wound care instructions given    Skin cancer screening   Lentigines - Scattered tan macules - Due to sun exposure - Benign-appearing, observe - Recommend daily broad spectrum sunscreen SPF 30+ to  sun-exposed areas, reapply every 2 hours as needed. - Call for any changes  Seborrheic Keratoses - Stuck-on, waxy, tan-brown papules and/or plaques  - Benign-appearing - Discussed benign etiology and prognosis. - Observe - Call for any changes  Melanocytic Nevi - Tan-brown and/or pink-flesh-colored symmetric macules and papules - Benign appearing on exam today - Observation - Call clinic for new or changing moles - Recommend daily use of broad spectrum spf 30+ sunscreen to sun-exposed areas.   Hemangiomas - Red papules - Discussed benign nature - Observe - Call for any changes  Actinic Damage - Chronic condition, secondary to cumulative UV/sun exposure - diffuse scaly erythematous macules with underlying dyspigmentation - Recommend daily broad spectrum sunscreen SPF 30+ to sun-exposed areas, reapply every 2 hours as needed.  - Staying in the shade or wearing long sleeves, sun glasses (UVA+UVB protection) and wide brim hats (4-inch brim around the entire circumference of the hat) are also recommended for sun protection.  - Call for new or changing lesions.  Skin cancer screening performed today.   Return in about 1 year (around 04/23/2022) for UBSE, Hx of ISKs.  IMarye Round, CMA, am acting as scribe for Sarina Ser, MD .  Documentation: I have reviewed the above documentation for accuracy and completeness, and I agree with the above.  Sarina Ser, MD

## 2021-04-28 ENCOUNTER — Encounter: Payer: Self-pay | Admitting: Dermatology

## 2021-04-29 ENCOUNTER — Telehealth: Payer: Self-pay

## 2021-04-29 ENCOUNTER — Ambulatory Visit (INDEPENDENT_AMBULATORY_CARE_PROVIDER_SITE_OTHER): Payer: Medicare Other

## 2021-04-29 DIAGNOSIS — I428 Other cardiomyopathies: Secondary | ICD-10-CM

## 2021-04-29 DIAGNOSIS — I5022 Chronic systolic (congestive) heart failure: Secondary | ICD-10-CM

## 2021-04-29 LAB — CUP PACEART REMOTE DEVICE CHECK
Battery Remaining Longevity: 27 mo
Battery Voltage: 2.94 V
Brady Statistic AP VP Percent: 0.51 %
Brady Statistic AP VS Percent: 0.01 %
Brady Statistic AS VP Percent: 99.47 %
Brady Statistic AS VS Percent: 0 %
Brady Statistic RA Percent Paced: 0.53 %
Brady Statistic RV Percent Paced: 99.98 %
Date Time Interrogation Session: 20221221023325
HighPow Impedance: 53 Ohm
HighPow Impedance: 67 Ohm
Implantable Lead Implant Date: 20060505
Implantable Lead Implant Date: 20120111
Implantable Lead Implant Date: 20180314
Implantable Lead Location: 753858
Implantable Lead Location: 753859
Implantable Lead Location: 753860
Implantable Lead Model: 5076
Implantable Lead Model: 6947
Implantable Pulse Generator Implant Date: 20180314
Lead Channel Impedance Value: 250.943
Lead Channel Impedance Value: 255.093
Lead Channel Impedance Value: 261.164
Lead Channel Impedance Value: 265.661
Lead Channel Impedance Value: 270.667
Lead Channel Impedance Value: 456 Ohm
Lead Channel Impedance Value: 475 Ohm
Lead Channel Impedance Value: 513 Ohm
Lead Channel Impedance Value: 532 Ohm
Lead Channel Impedance Value: 551 Ohm
Lead Channel Impedance Value: 608 Ohm
Lead Channel Impedance Value: 665 Ohm
Lead Channel Impedance Value: 817 Ohm
Lead Channel Impedance Value: 874 Ohm
Lead Channel Impedance Value: 893 Ohm
Lead Channel Impedance Value: 931 Ohm
Lead Channel Impedance Value: 988 Ohm
Lead Channel Impedance Value: 988 Ohm
Lead Channel Pacing Threshold Amplitude: 0.625 V
Lead Channel Pacing Threshold Amplitude: 0.625 V
Lead Channel Pacing Threshold Amplitude: 1.125 V
Lead Channel Pacing Threshold Pulse Width: 0.4 ms
Lead Channel Pacing Threshold Pulse Width: 0.4 ms
Lead Channel Pacing Threshold Pulse Width: 0.4 ms
Lead Channel Sensing Intrinsic Amplitude: 0.875 mV
Lead Channel Sensing Intrinsic Amplitude: 0.875 mV
Lead Channel Setting Pacing Amplitude: 1.25 V
Lead Channel Setting Pacing Amplitude: 1.5 V
Lead Channel Setting Pacing Amplitude: 2 V
Lead Channel Setting Pacing Pulse Width: 0.4 ms
Lead Channel Setting Pacing Pulse Width: 0.4 ms
Lead Channel Setting Sensing Sensitivity: 0.3 mV

## 2021-04-29 NOTE — Telephone Encounter (Signed)
**Note De-Identified Nyheim Seufert Obfuscation** Letter received from [employer] pt asst foundation stating that they have approved the pt for asst with Delene Loll and requesting that we fax them a [employer] for # 180 with 3 refills.  I printed the letter and have emailed it to Dr Olin Pia nurse so she can print a Entresto 24-26 mg RX for # 180 with 3 refills, have Dr Caryl Comes sign it, and to fax to [employer] at the fax number written on the cover letter attached.

## 2021-04-30 MED ORDER — ENTRESTO 24-26 MG PO TABS
1.0000 | ORAL_TABLET | Freq: Two times a day (BID) | ORAL | 3 refills | Status: DC
Start: 2021-04-30 — End: 2021-04-30

## 2021-04-30 MED ORDER — ENTRESTO 24-26 MG PO TABS
1.0000 | ORAL_TABLET | Freq: Two times a day (BID) | ORAL | 3 refills | Status: DC
Start: 2021-04-30 — End: 2022-01-04

## 2021-04-30 NOTE — Addendum Note (Signed)
Addended by: Janan Halter F on: 04/30/2021 08:43 AM   Modules accepted: Orders

## 2021-04-30 NOTE — Addendum Note (Signed)
Addended by: Thora Lance on: 04/30/2021 08:15 AM   Modules accepted: Orders

## 2021-04-30 NOTE — Telephone Encounter (Signed)
Rx printed and signed.  Placed with medical records for fax and scan.

## 2021-05-08 NOTE — Progress Notes (Signed)
Remote ICD transmission.   

## 2021-05-19 NOTE — Telephone Encounter (Signed)
**Note De-Identified [name] Obfuscation** Letter received Ryan [name] fax from [place] stating that they have approved the pt for asst with Entresto until 05/09/2022. Pt ID: [id number]  The letter states that they have notified the pt of this approval as well.

## 2021-06-12 ENCOUNTER — Telehealth: Payer: Self-pay

## 2021-06-12 NOTE — Telephone Encounter (Signed)
Prior authorization was initiated for Febuxostat prescription.  KEY: [id number]

## 2021-06-16 ENCOUNTER — Telehealth: Payer: Medicare Other

## 2021-06-16 ENCOUNTER — Ambulatory Visit (INDEPENDENT_AMBULATORY_CARE_PROVIDER_SITE_OTHER): Payer: Medicare Other

## 2021-06-16 DIAGNOSIS — E1159 Type 2 diabetes mellitus with other circulatory complications: Secondary | ICD-10-CM

## 2021-06-16 DIAGNOSIS — I152 Hypertension secondary to endocrine disorders: Secondary | ICD-10-CM

## 2021-06-16 DIAGNOSIS — E1169 Type 2 diabetes mellitus with other specified complication: Secondary | ICD-10-CM

## 2021-06-16 DIAGNOSIS — I5022 Chronic systolic (congestive) heart failure: Secondary | ICD-10-CM

## 2021-06-16 DIAGNOSIS — E785 Hyperlipidemia, unspecified: Secondary | ICD-10-CM

## 2021-06-16 NOTE — Chronic Care Management (AMB) (Signed)
Chronic Care Management   CCM RN Visit Note  06/16/2021 Name: ZIDANE RENNER [name]: [id number] DOB: 1950-10-10  Subjective: HJALMER IOVINO is a 71 y.o. year old male who is a primary care patient of Cannady, Barbaraann Faster, NP. The care management team was consulted for assistance with disease management and care coordination needs.    Engaged with patient by telephone for follow up visit in response to provider referral for case management and/or care coordination services.   Consent to Services:  The patient was given information about Chronic Care Management services, agreed to services, and gave verbal consent prior to initiation of services.  Please see initial visit note for detailed documentation.   Patient agreed to services and verbal consent obtained.   Assessment: Review of patient past medical history, allergies, medications, health status, including review of consultants reports, laboratory and other test data, was performed as part of comprehensive evaluation and provision of chronic care management services.   SDOH (Social Determinants of Health) assessments and interventions performed:    CCM Care Plan  Allergies  Allergen Reactions   Ace Inhibitors     Muscle aches    Crestor [Rosuvastatin Calcium] Other (See Comments)    Muscle aches    Outpatient Encounter Medications as of 06/16/2021  Medication Sig   carvedilol (COREG) 12.5 MG tablet Take 1 tablet (12.5 mg total) by mouth 2 (two) times daily.   Cholecalciferol (VITAMIN D3 SUPER STRENGTH) 50 MCG (2000 UT) CAPS Take 2,000 Units by mouth daily.   dapagliflozin propanediol (FARXIGA) 10 MG TABS tablet Take 1 tablet (10 mg total) by mouth daily before breakfast.   dexlansoprazole (DEXILANT) 60 MG capsule Take 1 capsule (60 mg total) by mouth daily.   febuxostat (ULORIC) 40 MG tablet Take 1 tablet (40 mg total) by mouth daily.   levothyroxine (SYNTHROID) 50 MCG tablet Take 1 tablet (50 mcg total) by mouth daily.    sacubitril-valsartan (ENTRESTO) 24-26 MG Take 1 tablet by mouth 2 (two) times daily.   Semaglutide (RYBELSUS) 7 MG TABS Take 7 mg by mouth daily. (Patient not taking: Reported on 04/14/2021)   spironolactone (ALDACTONE) 25 MG tablet Take 1 tablet (25 mg total) by mouth daily. (Patient taking differently: Take 12.5 mg by mouth daily.)   No facility-administered encounter medications on file as of 06/16/2021.    Patient Active Problem List   Diagnosis Date Noted   CKD stage 3 due to type 2 diabetes mellitus ([place]) 08/30/2019   Type 2 diabetes mellitus with cardiac complication ([place]) 69/45/0388   Hypothyroidism 05/26/2019   Advanced care planning/counseling discussion 82/80/0349   Systolic CHF ([place]) 17/91/5056   BPH (benign prostatic hyperplasia) 04/14/2016   Hypertension associated with diabetes ([place]) 12/16/2014   Gout 12/16/2014   Hyperlipidemia associated with type 2 diabetes mellitus ([place]) 06/13/2012   Biventricular implantable cardioverter-defibrillator in situ 09/28/2010   Idiopathic cardiomyopathy ([place]) 01/27/2009   Myalgia due to statin 01/27/2009    Conditions to be addressed/monitored:CHF, HTN, HLD, and DMII  Care Plan : RNCM: General Plan of Care (Adult) for Chronic Disease Management and Care Coordination Needs  Updates made by Vanita Ingles, RN since 06/16/2021 12:00 AM     Problem: RNCM: Development of Plan of Care for Chronic Disease Management (CHF, HTN, HLD, DM)   Priority: High     Long-Range Goal: RNCM: Effective Management  of Plan of Care for Chronic Disease Management (CHF, HTN, HLD, DM)   Start Date: 04/15/2021  Expected End Date: 04/15/2022  Priority: High  Note:   Current Barriers:  Knowledge Deficits related to plan of care for management of CHF, HTN, HLD, and DMII  Chronic Disease Management support and education needs related to CHF, HTN, HLD, and DMII  RNCM Clinical Goal(s):  Patient will verbalize basic understanding of CHF, HTN, HLD, and DMII disease  process and self health management plan as evidenced by keeping appointments, following plan of care, calling the office for questions or concerns, and working with the pcp and CCM team to optimize the plan of care and effectively manage health and well being  take all medications exactly as prescribed and will call provider for medication related questions as evidenced by compliance with medications and calling for refills before running out of medications     attend all scheduled medical appointments: 07-10-2021 at 0820 with pcp as evidenced by keeping appointments and calling for schedule change needs         demonstrate improved and ongoing adherence to prescribed treatment plan for CHF, HTN, HLD, and DMII as evidenced by stable conditions, seeing specialist as directed, and calling for changes in condition or questions  demonstrate ongoing self health care management ability for effective management of chronic conditions  as evidenced by working with the CCM team through collaboration with Consulting civil engineer, provider, and care team.   Interventions: 1:1 collaboration with primary care provider regarding development and update of comprehensive plan of care as evidenced by provider attestation and co-signature Inter-disciplinary care team collaboration (see longitudinal plan of care) Evaluation of current treatment plan related to  self management and patient's adherence to plan as established by provider   Heart Failure Interventions:  (Status: Goal on Track (progressing): YES.)  Long Term Goal  Basic overview and discussion of pathophysiology of Heart Failure reviewed Provided education on low sodium diet. 06-16-2021: Review of a heart healthy/ADA diet  Reviewed Heart Failure Action Plan in depth and provided written copy. 06-16-2021: The patient states that he saw his cardiologist in December and his cardiologist stated he had 2 years and 2 months left on his device. The patient states that he has  actually gained time on it and that makes him happy. He has a recorder at home that he uses. The patient denies any changes in his conditions and has been approved for assistance with getting his Delene Loll until the end of 2023. Education and support given.  Assessed need for readable accurate scales in home Provided education about placing scale on hard, flat surface Advised patient to weigh each morning after emptying bladder Discussed importance of daily weight and advised patient to weigh and record daily Reviewed role of diuretics in prevention of fluid overload and management of heart failure Discussed the importance of keeping all appointments with provider.pcp appointment on 07-10-2021  Provided patient with education about the role of exercise in the management of heart failure Advised patient to discuss acute changes of HF with provider Screening for signs and symptoms of depression related to chronic disease state  Assessed social determinant of health barriers EF% 40-45 %  Diabetes:  (Status: Goal on Track (progressing): YES.) Long Term Goal   Lab Results  Component Value Date   HGBA1C 5.6 04/10/2021  Assessed patient's understanding of A1c goal: <7% Provided education to patient about basic DM disease process; Reviewed medications with patient and discussed importance of medication adherence. 04-15-2021: The patient sent a my chart message to the pcp on 04-14-2021.  The patient has been having increased heartburn and  GI symptoms for over a week and he feels like it is caused by the Rybelsus. The patient stopped taking and has not taken for 2 days. He feels much better and was able to sleep last night. Per pcp note monitoring will continue and a new A1C will be obtained at March visit. Reviewed with the patient to call the office for questions or concerns or consistently low blood sugars or high blood sugars. The patient has a good understanding of effective management of his DM.  Verbalized understanding. Wanted to be able to take the Rybelsus but feels this was causing him to have more issues. Emotional listening and support given. 06-16-2021: The patient states that he had to stop taking the Rybelsus because it caused him to have bad acid reflux. The patient says this was really helping him keep his A1C down. The patient states that he really was hoping he could tolerate the medications but his symptoms just kept getting worse. He has discussed this with [name] and will follow up with her in March. Education and support given ;        Reviewed prescribed diet with patient heart healthy/ADA diet. 06-16-2021: Review and support given ; Counseled on importance of regular laboratory monitoring as prescribed. 06-16-2021: The patient will have a new A1C next month.        Discussed plans with patient for ongoing care management follow up and provided patient with direct contact information for care management team;      Provided patient with written educational materials related to hypo and hyperglycemia and importance of correct treatment. 06-16-2021: The patient denies any lows at this time.   The patient states he has not been checking his blood sugar consistently. He did take it this am and it was 190. Review of sx and sx of hypoglycemia and hyperglycemia.      Reviewed scheduled/upcoming provider appointments including: 07-10-2021 at 0820 am;         Advised patient, providing education and rationale, to check cbg when you have symptoms of low or high blood sugar, before and after exercise, and as directed by the pcp  and record . 04-15-2021: The patient states he is getting readings of 111 to 130, denies and extreme lows or highs. Will continue to monitor.   06-16-2021: The patient is not consistently checking his blood sugars. He states he his blood sugar this am was 190. Education and support given    call provider for findings outside established parameters;       Review of patient status,  including review of consultants reports, relevant laboratory and other test results, and medications completed;       Advised patient to discuss questions or concerns  with provider;      Last eye exam was January 2022. The patient states he needs to call and schedule an appointment for an eye exam and will do this soon  Hyperlipidemia:  (Status: Goal on Track (progressing): YES.) Long Term Goal  Lab Results  Component Value Date   CHOL 128 01/09/2021   HDL 34 (L) 01/09/2021   LDLCALC 68 01/09/2021   TRIG 146 01/09/2021   CHOLHDL 4.0 05/24/2018     Medication review performed; medication list updated in electronic medical record.  Provider established cholesterol goals reviewed; Counseled on importance of regular laboratory monitoring as prescribed. 06-16-2021: The patient has consistent lab work, last in September 2022 Provided HLD educational materials; Reviewed role and benefits of statin for ASCVD risk reduction;  Discussed strategies to manage statin-induced myalgias; Reviewed importance of limiting foods high in cholesterol. 06-16-2021: Review of Heart Healthy/ADA diet Reviewed exercise goals and target of 150 minutes per week;  Hypertension: (Status: Goal on Track (progressing): YES.) Last practice recorded BP readings:  BP Readings from Last 3 Encounters:  04/14/21 128/80  04/10/21 127/77  01/09/21 130/82  Most recent eGFR/CrCl:  Lab Results  Component Value Date   EGFR 55 (L) 04/10/2021    No components found for: CRCL  Evaluation of current treatment plan related to hypertension self management and patient's adherence to plan as established by provider. 06-16-2021: Denies any issues with HTN or heart health. Sees cardiologist and pcp on a consistent basis. Will continue to monitor for changes in conditions.   Provided education to patient re: stroke prevention, s/s of heart attack and stroke; Reviewed prescribed diet heart healthy/ADA diet  Reviewed medications with patient  and discussed importance of compliance;  Discussed plans with patient for ongoing care management follow up and provided patient with direct contact information for care management team; Advised patient, providing education and rationale, to monitor blood pressure daily and record, calling PCP for findings outside established parameters;  Advised patient to discuss blood pressure trends or changes in heart health with provider; Provided education on prescribed diet heart healthy/ADA diet ;  Discussed complications of poorly controlled blood pressure such as heart disease, stroke, circulatory complications, vision complications, kidney impairment, sexual dysfunction;   Patient Goals/Self-Care Activities: Take medications as prescribed   Attend all scheduled provider appointments Call pharmacy for medication refills 3-7 days in advance of running out of medications Attend church or other social activities Perform all self care activities independently  Perform IADL's (shopping, preparing meals, housekeeping, managing finances) independently Call provider office for new concerns or questions  Work with the social worker to address care coordination needs and will continue to work with the clinical team to address health care and disease management related needs call the Suicide and Crisis Lifeline: 988 call the [place] National Suicide Prevention Lifeline: (941)482-9271 or TTY: (907) 683-8436 TTY 302-266-8877) to talk to a trained counselor call 1-800-273-TALK (toll free, 24 hour hotline) if experiencing a Mental Health or [place]  call office if I gain more than 2 pounds in one day or 5 pounds in one week keep legs up while sitting track weight in diary use salt in moderation watch for swelling in feet, ankles and legs every day weigh myself daily develop a rescue plan follow rescue plan if symptoms flare-up eat more whole grains, fruits and vegetables, lean meats and healthy  fats know when to call the doctorfor changes in sx and sx of CHF to prevent exacerbation track symptoms and what helps feel better or worse dress right for the weather, hot or cold schedule appointment with eye doctor- Last eye exam January 2022 will call for appointment in new year. 06-16-2021- reminder to call for an appointment given today.  check blood sugar at prescribed times: when you have symptoms of low or high blood sugar, before and after exercise, and as directed   check feet daily for cuts, sores or redness enter blood sugar readings and medication or insulin into daily log take the blood sugar log to all doctor visits trim toenails straight across drink 6 to 8 glasses of water each day eat fish at least once per week fill half of plate with vegetables limit fast food meals to no more than 1 per week manage portion size  prepare main meal at home 3 to 5 days each week read food labels for fat, fiber, carbohydrates and portion size reduce red meat to 2 to 3 times a week set a realistic goal keep feet up while sitting wash and dry feet carefully every day wear comfortable, cotton socks wear comfortable, well-fitting shoes check blood pressure 3 times per week choose a place to take my blood pressure (home, clinic or office, retail store) write blood pressure results in a log or diary learn about high blood pressure keep a blood pressure log take blood pressure log to all doctor appointments call doctor for signs and symptoms of high blood pressure develop an action plan for high blood pressure keep all doctor appointments take medications for blood pressure exactly as prescribed report new symptoms to your doctor eat more whole grains, fruits and vegetables, lean meats and healthy fats - call for medicine refill 2 or 3 days before it runs out - take all medications exactly as prescribed - call doctor with any symptoms you believe are related to your medicine - call  doctor when you experience any new symptoms - go to all doctor appointments as scheduled - adhere to prescribed diet: heart healthy/ADA diet        Plan:Telephone follow up appointment with care management team member scheduled for:  08-18-2021 at 0900 am  [place], MSN, [facility] Mobile: [phone]

## 2021-06-16 NOTE — Patient Instructions (Signed)
Visit Information  Thank you for taking time to visit with me today. Please don't hesitate to contact me if I can be of assistance to you before our next scheduled telephone appointment.  Following are the goals we discussed today:  RNCM Clinical Goal(s):  Patient will verbalize basic understanding of CHF, HTN, HLD, and DMII disease process and self health management plan as evidenced by keeping appointments, following plan of care, calling the office for questions or concerns, and working with the pcp and CCM team to optimize the plan of care and effectively manage health and well being  take all medications exactly as prescribed and will call provider for medication related questions as evidenced by compliance with medications and calling for refills before running out of medications     attend all scheduled medical appointments: 07-10-2021 at 0820 with pcp as evidenced by keeping appointments and calling for schedule change needs         demonstrate improved and ongoing adherence to prescribed treatment plan for CHF, HTN, HLD, and DMII as evidenced by stable conditions, seeing specialist as directed, and calling for changes in condition or questions  demonstrate ongoing self health care management ability for effective management of chronic conditions  as evidenced by working with the CCM team through collaboration with Consulting civil engineer, provider, and care team.    Interventions: 1:1 collaboration with primary care provider regarding development and update of comprehensive plan of care as evidenced by provider attestation and co-signature Inter-disciplinary care team collaboration (see longitudinal plan of care) Evaluation of current treatment plan related to  self management and patient's adherence to plan as established by provider     Heart Failure Interventions:  (Status: Goal on Track (progressing): YES.)  Long Term Goal  Basic overview and discussion of pathophysiology of Heart Failure  reviewed Provided education on low sodium diet. 06-16-2021: Review of a heart healthy/ADA diet  Reviewed Heart Failure Action Plan in depth and provided written copy. 06-16-2021: The patient states that he saw his cardiologist in December and his cardiologist stated he had 2 years and 2 months left on his device. The patient states that he has actually gained time on it and that makes him happy. He has a recorder at home that he uses. The patient denies any changes in his conditions and has been approved for assistance with getting his Delene Loll until the end of 2023. Education and support given.  Assessed need for readable accurate scales in home Provided education about placing scale on hard, flat surface Advised patient to weigh each morning after emptying bladder Discussed importance of daily weight and advised patient to weigh and record daily Reviewed role of diuretics in prevention of fluid overload and management of heart failure Discussed the importance of keeping all appointments with provider.pcp appointment on 07-10-2021  Provided patient with education about the role of exercise in the management of heart failure Advised patient to discuss acute changes of HF with provider Screening for signs and symptoms of depression related to chronic disease state  Assessed social determinant of health barriers EF% 40-45 %   Diabetes:  (Status: Goal on Track (progressing): YES.) Long Term Goal         Lab Results  Component Value Date    HGBA1C 5.6 04/10/2021  Assessed patient's understanding of A1c goal: <7% Provided education to patient about basic DM disease process; Reviewed medications with patient and discussed importance of medication adherence. 04-15-2021: The patient sent a my chart message to the  pcp on 04-14-2021.  The patient has been having increased heartburn and GI symptoms for over a week and he feels like it is caused by the Rybelsus. The patient stopped taking and has not taken for 2  days. He feels much better and was able to sleep last night. Per pcp note monitoring will continue and a new A1C will be obtained at March visit. Reviewed with the patient to call the office for questions or concerns or consistently low blood sugars or high blood sugars. The patient has a good understanding of effective management of his DM. Verbalized understanding. Wanted to be able to take the Rybelsus but feels this was causing him to have more issues. Emotional listening and support given. 06-16-2021: The patient states that he had to stop taking the Rybelsus because it caused him to have bad acid reflux. The patient says this was really helping him keep his A1C down. The patient states that he really was hoping he could tolerate the medications but his symptoms just kept getting worse. He has discussed this with [name] and will follow up with her in March. Education and support given ;        Reviewed prescribed diet with patient heart healthy/ADA diet. 06-16-2021: Review and support given ; Counseled on importance of regular laboratory monitoring as prescribed. 06-16-2021: The patient will have a new A1C next month.        Discussed plans with patient for ongoing care management follow up and provided patient with direct contact information for care management team;      Provided patient with written educational materials related to hypo and hyperglycemia and importance of correct treatment. 06-16-2021: The patient denies any lows at this time.   The patient states he has not been checking his blood sugar consistently. He did take it this am and it was 190. Review of sx and sx of hypoglycemia and hyperglycemia.      Reviewed scheduled/upcoming provider appointments including: 07-10-2021 at 0820 am;         Advised patient, providing education and rationale, to check cbg when you have symptoms of low or high blood sugar, before and after exercise, and as directed by the pcp  and record . 04-15-2021: The patient  states he is getting readings of 111 to 130, denies and extreme lows or highs. Will continue to monitor.   06-16-2021: The patient is not consistently checking his blood sugars. He states he his blood sugar this am was 190. Education and support given    call provider for findings outside established parameters;       Review of patient status, including review of consultants reports, relevant laboratory and other test results, and medications completed;       Advised patient to discuss questions or concerns  with provider;      Last eye exam was January 2022. The patient states he needs to call and schedule an appointment for an eye exam and will do this soon   Hyperlipidemia:  (Status: Goal on Track (progressing): YES.) Long Term Goal       Lab Results  Component Value Date    CHOL 128 01/09/2021    HDL 34 (L) 01/09/2021    LDLCALC 68 01/09/2021    TRIG 146 01/09/2021    CHOLHDL 4.0 05/24/2018      Medication review performed; medication list updated in electronic medical record.  Provider established cholesterol goals reviewed; Counseled on importance of regular laboratory monitoring as prescribed. 06-16-2021:  The patient has consistent lab work, last in September 2022 Provided HLD educational materials; Reviewed role and benefits of statin for ASCVD risk reduction; Discussed strategies to manage statin-induced myalgias; Reviewed importance of limiting foods high in cholesterol. 06-16-2021: Review of Heart Healthy/ADA diet Reviewed exercise goals and target of 150 minutes per week;   Hypertension: (Status: Goal on Track (progressing): YES.) Last practice recorded BP readings:     BP Readings from Last 3 Encounters:  04/14/21 128/80  04/10/21 127/77  01/09/21 130/82  Most recent eGFR/CrCl:       Lab Results  Component Value Date    EGFR 55 (L) 04/10/2021    No components found for: CRCL   Evaluation of current treatment plan related to hypertension self management and patient's  adherence to plan as established by provider. 06-16-2021: Denies any issues with HTN or heart health. Sees cardiologist and pcp on a consistent basis. Will continue to monitor for changes in conditions.   Provided education to patient re: stroke prevention, s/s of heart attack and stroke; Reviewed prescribed diet heart healthy/ADA diet  Reviewed medications with patient and discussed importance of compliance;  Discussed plans with patient for ongoing care management follow up and provided patient with direct contact information for care management team; Advised patient, providing education and rationale, to monitor blood pressure daily and record, calling PCP for findings outside established parameters;  Advised patient to discuss blood pressure trends or changes in heart health with provider; Provided education on prescribed diet heart healthy/ADA diet ;  Discussed complications of poorly controlled blood pressure such as heart disease, stroke, circulatory complications, vision complications, kidney impairment, sexual dysfunction;    Patient Goals/Self-Care Activities: Take medications as prescribed   Attend all scheduled provider appointments Call pharmacy for medication refills 3-7 days in advance of running out of medications Attend church or other social activities Perform all self care activities independently  Perform IADL's (shopping, preparing meals, housekeeping, managing finances) independently Call provider office for new concerns or questions  Work with the social worker to address care coordination needs and will continue to work with the clinical team to address health care and disease management related needs call the Suicide and Crisis Lifeline: 988 call the [place] National Suicide Prevention Lifeline: 620 410 0163 or TTY: 928-847-6794 TTY 3434587825) to talk to a trained counselor call 1-800-273-TALK (toll free, 24 hour hotline) if experiencing a Mental Health or [place]  call office if I gain more than 2 pounds in one day or 5 pounds in one week keep legs up while sitting track weight in diary use salt in moderation watch for swelling in feet, ankles and legs every day weigh myself daily develop a rescue plan follow rescue plan if symptoms flare-up eat more whole grains, fruits and vegetables, lean meats and healthy fats know when to call the doctorfor changes in sx and sx of CHF to prevent exacerbation track symptoms and what helps feel better or worse dress right for the weather, hot or cold schedule appointment with eye doctor- Last eye exam January 2022 will call for appointment in new year. 06-16-2021- reminder to call for an appointment given today.  check blood sugar at prescribed times: when you have symptoms of low or high blood sugar, before and after exercise, and as directed   check feet daily for cuts, sores or redness enter blood sugar readings and medication or insulin into daily log take the blood sugar log to all doctor visits trim toenails straight across  drink 6 to 8 glasses of water each day eat fish at least once per week fill half of plate with vegetables limit fast food meals to no more than 1 per week manage portion size prepare main meal at home 3 to 5 days each week read food labels for fat, fiber, carbohydrates and portion size reduce red meat to 2 to 3 times a week set a realistic goal keep feet up while sitting wash and dry feet carefully every day wear comfortable, cotton socks wear comfortable, well-fitting shoes check blood pressure 3 times per week choose a place to take my blood pressure (home, clinic or office, retail store) write blood pressure results in a log or diary learn about high blood pressure keep a blood pressure log take blood pressure log to all doctor appointments call doctor for signs and symptoms of high blood pressure develop an action plan for high blood pressure keep all doctor  appointments take medications for blood pressure exactly as prescribed report new symptoms to your doctor eat more whole grains, fruits and vegetables, lean meats and healthy fats - call for medicine refill 2 or 3 days before it runs out - take all medications exactly as prescribed - call doctor with any symptoms you believe are related to your medicine - call doctor when you experience any new symptoms - go to all doctor appointments as scheduled - adhere to prescribed diet: heart healthy/ADA diet       Our next appointment is by telephone on 08-18-2021 at 0900 am  Please call the care guide team at [phone] if you need to cancel or reschedule your appointment.   If you are experiencing a Mental Health or [place] or need someone to talk to, please call the Suicide and Crisis Lifeline: 988 call the [place] National Suicide Prevention Lifeline: 810-004-2672 or TTY: 830-253-6449 TTY 667-765-9116) to talk to a trained counselor call 1-800-273-TALK (toll free, 24 hour hotline)   Patient verbalizes understanding of instructions and care plan provided today and agrees to view in [place]. Active MyChart status confirmed with patient.    Noreene Larsson RN, MSN, [facility] Mobile: [phone]

## 2021-07-05 NOTE — Patient Instructions (Signed)

## 2021-07-07 DIAGNOSIS — E785 Hyperlipidemia, unspecified: Secondary | ICD-10-CM

## 2021-07-07 DIAGNOSIS — I5022 Chronic systolic (congestive) heart failure: Secondary | ICD-10-CM

## 2021-07-07 DIAGNOSIS — I152 Hypertension secondary to endocrine disorders: Secondary | ICD-10-CM

## 2021-07-07 DIAGNOSIS — E1159 Type 2 diabetes mellitus with other circulatory complications: Secondary | ICD-10-CM | POA: Diagnosis not present

## 2021-07-07 DIAGNOSIS — E1169 Type 2 diabetes mellitus with other specified complication: Secondary | ICD-10-CM

## 2021-07-10 ENCOUNTER — Encounter: Payer: Self-pay | Admitting: Nurse Practitioner

## 2021-07-10 ENCOUNTER — Other Ambulatory Visit: Payer: Self-pay

## 2021-07-10 ENCOUNTER — Ambulatory Visit (INDEPENDENT_AMBULATORY_CARE_PROVIDER_SITE_OTHER): Payer: Medicare Other | Admitting: Nurse Practitioner

## 2021-07-10 VITALS — BP 122/81 | HR 76 | Temp 98.4°F | Ht 72.5 in | Wt 197.6 lb

## 2021-07-10 DIAGNOSIS — I429 Cardiomyopathy, unspecified: Secondary | ICD-10-CM

## 2021-07-10 DIAGNOSIS — E1159 Type 2 diabetes mellitus with other circulatory complications: Secondary | ICD-10-CM

## 2021-07-10 DIAGNOSIS — N183 Chronic kidney disease, stage 3 unspecified: Secondary | ICD-10-CM | POA: Diagnosis not present

## 2021-07-10 DIAGNOSIS — M791 Myalgia, unspecified site: Secondary | ICD-10-CM

## 2021-07-10 DIAGNOSIS — Z0000 Encounter for general adult medical examination without abnormal findings: Secondary | ICD-10-CM

## 2021-07-10 DIAGNOSIS — E1169 Type 2 diabetes mellitus with other specified complication: Secondary | ICD-10-CM

## 2021-07-10 DIAGNOSIS — M1A3720 Chronic gout due to renal impairment, left ankle and foot, without tophus (tophi): Secondary | ICD-10-CM

## 2021-07-10 DIAGNOSIS — E039 Hypothyroidism, unspecified: Secondary | ICD-10-CM | POA: Diagnosis not present

## 2021-07-10 DIAGNOSIS — E559 Vitamin D deficiency, unspecified: Secondary | ICD-10-CM

## 2021-07-10 DIAGNOSIS — I5022 Chronic systolic (congestive) heart failure: Secondary | ICD-10-CM

## 2021-07-10 DIAGNOSIS — N400 Enlarged prostate without lower urinary tract symptoms: Secondary | ICD-10-CM

## 2021-07-10 DIAGNOSIS — T466X5A Adverse effect of antihyperlipidemic and antiarteriosclerotic drugs, initial encounter: Secondary | ICD-10-CM

## 2021-07-10 DIAGNOSIS — E785 Hyperlipidemia, unspecified: Secondary | ICD-10-CM | POA: Diagnosis not present

## 2021-07-10 DIAGNOSIS — I152 Hypertension secondary to endocrine disorders: Secondary | ICD-10-CM

## 2021-07-10 DIAGNOSIS — K219 Gastro-esophageal reflux disease without esophagitis: Secondary | ICD-10-CM

## 2021-07-10 DIAGNOSIS — Z95810 Presence of automatic (implantable) cardiac defibrillator: Secondary | ICD-10-CM

## 2021-07-10 DIAGNOSIS — E1122 Type 2 diabetes mellitus with diabetic chronic kidney disease: Secondary | ICD-10-CM | POA: Diagnosis not present

## 2021-07-10 LAB — MICROALBUMIN, URINE WAIVED
Creatinine, Urine Waived: 50 mg/dL (ref 10–300)
Microalb, Ur Waived: 10 mg/L (ref 0–19)
Microalb/Creat Ratio: <30 mg/g (ref <30)

## 2021-07-10 LAB — BAYER DCA HB A1C WAIVED: HB A1C (BAYER DCA - WAIVED): 7.5 % — ABNORMAL HIGH (ref 4.8–5.6)

## 2021-07-10 MED ORDER — FEBUXOSTAT 40 MG PO TABS: 40.0000 mg | ORAL_TABLET | Freq: Every day | ORAL | 4 refills | Status: DC

## 2021-07-10 MED ORDER — SPIRONOLACTONE 25 MG PO TABS: 12.5000 mg | ORAL_TABLET | Freq: Every day | ORAL | 4 refills | Status: DC

## 2021-07-10 MED ORDER — LEVOTHYROXINE SODIUM 50 MCG PO TABS: 50.0000 ug | ORAL_TABLET | Freq: Every day | ORAL | 4 refills | Status: DC

## 2021-07-10 MED ORDER — SITAGLIPTIN PHOSPHATE 50 MG PO TABS: 50.0000 mg | ORAL_TABLET | Freq: Every day | ORAL | 4 refills | Status: DC

## 2021-07-10 MED ORDER — DEXLANSOPRAZOLE 60 MG PO CPDR: 60.0000 mg | DELAYED_RELEASE_CAPSULE | Freq: Every day | ORAL | 4 refills | Status: DC

## 2021-07-10 NOTE — Assessment & Plan Note (Signed)
Chronic, stable.  Euvolemic today.  Continue current medication regimen and collaboration with cardiology.  CMP today.   Recommend: ?- Reminded to call for an overnight weight gain of >2 pounds or a weekly weight weight of >5 pounds ?- not adding salt to his food and has been reading food labels. Reviewed the importance of keeping daily sodium intake to 2000mg  daily  ?- Avoid NSAIDS ?

## 2021-07-10 NOTE — Assessment & Plan Note (Signed)
Due to statin therapy, history of on low doses.  Would benefit from statin use with diabetes and heart health or consider injectable, discussed with him today.  

## 2021-07-10 NOTE — Assessment & Plan Note (Signed)
Chronic, ongoing.  Continue current medication regimen and adjust as needed based on labs.  TSH + Free T4 today. ?

## 2021-07-10 NOTE — Assessment & Plan Note (Signed)
Continue collaboration with cardiology and regular checks. 

## 2021-07-10 NOTE — Assessment & Plan Note (Signed)
Chronic, stable.  Continue Uloric and recheck uric acid today. ?

## 2021-07-10 NOTE — Assessment & Plan Note (Signed)
Chronic, stable with BP at goal today and on home readings.  Continue current medication regimen and collaboration with cardiology.  Recommend checking BP at home at least a few mornings a week + focus on DASH diet.  BMP today.  Return in 3 months. ?

## 2021-07-10 NOTE — Progress Notes (Signed)
BP 122/81    Pulse 76    Temp 98.4 F (36.9 C) (Oral)    Ht 6' 0.5" (1.842 m)    Wt 197 lb 9.6 oz (89.6 kg)    SpO2 98%    BMI 26.43 kg/m    Subjective:    Patient ID: Ryan [name], male    DOB: March 15, 1951, 71 y.o.   [name]: [id number]  HPI: Ryan [name] is a 71 y.o. male presenting on 07/10/2021 for comprehensive medical examination. Current medical complaints include:none  He currently lives with: wife Interim Problems from his last visit: no   DIABETES Last A1c 5.6% in December.  Takes Wilder Glade (gets via assistance), had been on Rybelsus 7 MG but caused GI upset. Had intolerance to Metformin.  Prefers no injectables. Hypoglycemic episodes:no Polydipsia/polyuria: no Visual disturbance: no Chest pain: no Paresthesias: no Glucose Monitoring: no  Accucheck frequency: Not Checking  Fasting glucose:  Post prandial:  Evening:  Before meals: Taking Insulin?: no  Long acting insulin:  Short acting insulin: Blood Pressure Monitoring: daily Retinal Examination: Up to Date is scheduled for March Foot Exam: Up to Date Pneumovax: refuses Influenza:  refused Aspirin: no   HYPERTENSION / HYPERLIPIDEMIA/HF Is followed by cardiology, Dr. Caryl Comes, last saw him 04/14/21 and has cardio-defib implantable (replacement last done in January 2012) last check 04/29/21.  Continues on Aldactone, Entresto (gets via assistance), Coreg.  No statin, reports cardiology took him off of this.  Tried Pravastatin in past with poor response -- myalgia.    Continues on uloric for history of gout, no flare in 7-8 years. Satisfied with current treatment? yes Duration of hypertension: chronic BP monitoring frequency: a few times a week BP range: 120/80 range BP medication side effects: no Duration of hyperlipidemia: chronic Medication compliance: good compliance Aspirin: no Recent stressors: no Recurrent headaches: no Visual changes: no Palpitations: no Dyspnea: no Chest pain: no Lower extremity edema:  no Dizzy/lightheaded: no  The ASCVD Risk score (Arnett DK, et al., 2019) failed to calculate for the following reasons:   The valid total cholesterol range is 130 to 320 mg/dL   HYPOTHYROIDISM Continues Levothyroxine 50 MCG.  Taking Dexilant for heart burn. Thyroid control status:stable Satisfied with current treatment? yes Medication side effects: no Medication compliance: good compliance Etiology of hypothyroidism:  Recent dose adjustment:no Fatigue: no Cold intolerance: no Heat intolerance: no Weight gain: no Weight loss: no Constipation: no Diarrhea/loose stools: no Palpitations: no Lower extremity edema: no Anxiety/depressed mood: no  Functional Status Survey: Is the patient deaf or have difficulty hearing?: No Does the patient have difficulty seeing, even when wearing glasses/contacts?: No Does the patient have difficulty concentrating, remembering, or making decisions?: No Does the patient have difficulty walking or climbing stairs?: No Does the patient have difficulty dressing or bathing?: No Does the patient have difficulty doing errands alone such as visiting a doctor's office or shopping?: No  FALL RISK: Fall Risk  07/10/2021 07/10/2021 02/04/2021 06/04/2020 02/28/2020  Falls in the past year? 0 0 0 0 0  Number falls in past yr: 0 0 0 - 0  Injury with Fall? 0 0 0 - 0  Risk for fall due to : No Fall Risks No Fall Risks No Fall Risks - No Fall Risks  Follow up Falls evaluation completed Falls evaluation completed Falls evaluation completed - Falls evaluation completed    Depression Screen Depression screen [facility] 2/9 07/10/2021 02/04/2021 06/04/2020 05/29/2019 01/17/2019  Decreased Interest 0 0 0 0 0  Down,  Depressed, Hopeless 0 0 0 0 0  PHQ - 2 Score 0 0 0 0 0  Altered sleeping 0 - 0 0 -  Tired, decreased energy 0 - 0 0 -  Change in appetite 0 - 0 0 -  Feeling bad or failure about yourself  0 - 0 0 -  Trouble concentrating 0 - 0 0 -  Moving slowly or fidgety/restless 0 -  0 0 -  Suicidal thoughts 0 - 0 0 -  PHQ-9 Score 0 - 0 0 -  Difficult doing work/chores - - - Not difficult at all -  Some recent data might be hidden    Advanced Directives <no information>  Past Medical History:  Past Medical History:  Diagnosis Date   AICD (automatic cardioverter/defibrillator) present    Anxiety    Complete heart block ([place])    Depression    Diabetes mellitus, type 2 ([place])    Dual ICD (implantable cardiac defibrillator-Medtronic    a. 6949 implanted originally s/p 6947 lead & generator change out 05/2010; b. upgraded to MDT CRT-D in 07/2016   GERD (gastroesophageal reflux disease)    Gout    History of kidney stones    Hypertension    IBS (irritable bowel syndrome)    Kidney stones    Nonischemic cardiomyopathy ([place])    a. Echo 07/2012 EF 35-40%, severe HK of the mid-distalanterior myocardium w/ AK of the distalinferior myocardium, GR1DD, mild MR, mild to mod dilated LA; b/ echo 06/2016 EF 35-40%, severe HK of the mid-apicalanteroseptal and anterior myocardium and severe HK of the apicalinferior myocardium along with AK of the apical myocardium, GR1DD, calcified mitral annulus, mildly dilated left atrium   Paroxysmal atrial fibrillation ([place])    a. CHADS2VASc => 3 (CHF, HTN, age x 1); b. on ASA only   Presence of permanent cardiac pacemaker    Syncope     Surgical History:  Past Surgical History:  Procedure Laterality Date   BIV UPGRADE N/A 07/21/2016   Procedure: BiV Upgrade;  Surgeon: Deboraha Sprang, MD;  Location: [facility];  Service: Cardiovascular;  Laterality: N/A;   CARDIAC DEFIBRILLATOR PLACEMENT     DIRECT LARYNGOSCOPY Right 07/14/2017   Procedure: MICRODIRECT LARYNGOSCOPY WITH EXCISION OF RIGHT VOCAL CORD LESION WITH MICRO FLAP TECHNIQUE;  Surgeon: Carloyn Manner, MD;  Location: [facility];  Service: ENT;  Laterality: Right;   LARYNGOSCOPY Right 01/06/2017   Procedure: LARYNGOSCOPY WITH EXCISION OF RIGHT VOCAL CORD LESION;  Surgeon: Carloyn Manner, MD;  Location: [facility];  Service: ENT;  Laterality: Right;   LITHOTRIPSY     UPPER ENDOSCOPY W/ ESOPHAGEAL MANOMETRY      Medications:  Current Outpatient Medications on File Prior to Visit  Medication Sig   carvedilol (COREG) 12.5 MG tablet Take 1 tablet (12.5 mg total) by mouth 2 (two) times daily.   Cholecalciferol (VITAMIN D3 SUPER STRENGTH) 50 MCG (2000 UT) CAPS Take 2,000 Units by mouth daily.   dapagliflozin propanediol (FARXIGA) 10 MG TABS tablet Take 1 tablet (10 mg total) by mouth daily before breakfast.   sacubitril-valsartan (ENTRESTO) 24-26 MG Take 1 tablet by mouth 2 (two) times daily.   No current facility-administered medications on file prior to visit.    Allergies:  Allergies  Allergen Reactions   Ace Inhibitors     Muscle aches    Crestor [Rosuvastatin Calcium] Other (See Comments)    Muscle aches    Social History:  Social History   Socioeconomic History  Marital status: Married    Spouse name: Not on file   Number of children: 1   Years of education: 19   Highest education level: 12th grade  Occupational History   Occupation: Self Employed    Employer: [employer]  Tobacco Use   Smoking status: Former    Types: Cigarettes    Quit date: 12/30/1989    Years since quitting: 31.5   Smokeless tobacco: Never  Vaping Use   Vaping Use: Never used  Substance and Sexual Activity   Alcohol use: Yes    Alcohol/week: 7.0 standard drinks    Types: 7 Cans of beer per week   Drug use: No   Sexual activity: Yes  Other Topics Concern   Not on file  Social History Narrative   Not on file   Social Determinants of Health   Financial Resource Strain: Medium Risk   Difficulty of Paying Living Expenses: Somewhat hard  Food Insecurity: No Food Insecurity   Worried About Charity fundraiser in the Last Year: Never true   Ran Out of Food in the Last Year: Never true  Transportation Needs: No Transportation Needs   Lack of Transportation  (Medical): No   Lack of Transportation (Non-Medical): No  Physical Activity: Sufficiently Active   Days of Exercise per Week: 7 days   Minutes of Exercise per Session: 30 min  Stress: No Stress Concern Present   Feeling of Stress : Not at all  Social Connections: Moderately Isolated   Frequency of Communication with Friends and Family: More than three times a week   Frequency of Social Gatherings with Friends and Family: More than three times a week   Attends Religious Services: Never   Marine scientist or Organizations: No   Attends Music therapist: Never   Marital Status: Married  Human resources officer Violence: Not At Risk   Fear of Current or Ex-Partner: No   Emotionally Abused: No   Physically Abused: No   Sexually Abused: No   Social History   Tobacco Use  Smoking Status Former   Types: Cigarettes   Quit date: 12/30/1989   Years since quitting: 31.5  Smokeless Tobacco Never   Social History   Substance and Sexual Activity  Alcohol Use Yes   Alcohol/week: 7.0 standard drinks   Types: 7 Cans of beer per week    Family History:  Family History  Problem Relation Age of Onset   Breast cancer Mother    Diabetes Mother    Heart disease Mother    Cancer Mother        breast   Lung cancer Father    Diabetes Father    Cancer Father        lung   Diabetes Sister     Past medical history, surgical history, medications, allergies, family history and social history reviewed with patient today and changes made to appropriate areas of the chart.   Review of Systems - negative All other ROS negative except what is listed above and in the HPI.      Objective:    BP 122/81    Pulse 76    Temp 98.4 F (36.9 C) (Oral)    Ht 6' 0.5" (1.842 m)    Wt 197 lb 9.6 oz (89.6 kg)    SpO2 98%    BMI 26.43 kg/m   Wt Readings from Last 3 Encounters:  07/10/21 197 lb 9.6 oz (89.6 kg)  04/14/21 190 lb (  86.2 kg)  04/10/21 191 lb (86.6 kg)    Physical Exam Vitals  and nursing note reviewed.  Constitutional:      General: He is awake. He is not in acute distress.    Appearance: He is well-developed. He is overweight. He is not ill-appearing.  HENT:     Head: Normocephalic and atraumatic.     Right Ear: Hearing, tympanic membrane, ear canal and external ear normal. No drainage.     Left Ear: Hearing, tympanic membrane, ear canal and external ear normal. No drainage.     Nose: Nose normal.     Mouth/Throat:     Pharynx: Uvula midline.  Eyes:     General: Lids are normal.        Right eye: No discharge.        Left eye: No discharge.     Extraocular Movements: Extraocular movements intact.     Conjunctiva/sclera: Conjunctivae normal.     Pupils: Pupils are equal, round, and reactive to light.     Visual Fields: Right eye visual fields normal and left eye visual fields normal.  Neck:     Thyroid: No thyromegaly.     Vascular: No carotid bruit or JVD.     Trachea: Trachea normal.  Cardiovascular:     Rate and Rhythm: Normal rate and regular rhythm.     Heart sounds: Normal heart sounds, S1 normal and S2 normal. No murmur. No gallop.   Pulmonary:     Effort: Pulmonary effort is normal. No accessory muscle usage or respiratory distress.     Breath sounds: Normal breath sounds.  Abdominal:     General: Bowel sounds are normal.     Palpations: Abdomen is soft. There is no hepatomegaly or splenomegaly.     Tenderness: There is no abdominal tenderness.  Genitourinary:    Comments: Deferred per patient request Musculoskeletal:        General: Normal range of motion.     Cervical back: Normal range of motion and neck supple.     Right lower leg: No edema.     Left lower leg: No edema.  Lymphadenopathy:     Head:     Right side of head: No submental, submandibular, tonsillar, preauricular or posterior auricular adenopathy.     Left side of head: No submental, submandibular, tonsillar, preauricular or posterior auricular adenopathy.     Cervical:  No cervical adenopathy.  Skin:    General: Skin is warm and dry.     Capillary Refill: Capillary refill takes less than 2 seconds.     Findings: No rash.  Neurological:     Mental Status: He is alert and oriented to person, place, and time.     Cranial Nerves: Cranial nerves are intact.     Gait: Gait is intact.     Deep Tendon Reflexes: Reflexes are normal and symmetric.     Reflex Scores:      Brachioradialis reflexes are 2+ on the right side and 2+ on the left side.      Patellar reflexes are 2+ on the right side and 2+ on the left side. Psychiatric:        Attention and Perception: Attention normal.        Mood and Affect: Mood normal.        Speech: Speech normal.        Behavior: Behavior normal. Behavior is cooperative.        Thought Content: Thought content normal.  Cognition and Memory: Cognition normal.        Judgment: Judgment normal.   Diabetic Foot Exam - Simple   Simple Foot Form Visual Inspection No deformities, no ulcerations, no other skin breakdown bilaterally: Yes Sensation Testing Intact to touch and monofilament testing bilaterally: Yes Pulse Check Posterior Tibialis and Dorsalis pulse intact bilaterally: Yes Comments     Results for orders placed or performed in visit on 04/29/21  CUP PACEART REMOTE DEVICE CHECK  Result Value Ref Range   Date Time Interrogation Session [phone]    Pulse Generator Manufacturer MERM    Pulse Gen Model [id number] Claria MRI Quad CRT-D    Pulse Gen Serial Number [id number]    Clinic Name [facility]    Implantable Pulse Generator Type Cardiac Resynch Therapy Defibulator    Implantable Pulse Generator Implant Date 70350093    Implantable Lead Manufacturer [facility]    Implantable Lead Model 1458Q Quartet    Implantable Lead Serial Number [id number]    Implantable Lead Implant Date 81829937    Implantable Lead Location Detail 1 UNKNOWN    Implantable Lead Location [id number]    Implantable Lead Manufacturer  [facility]    Implantable Lead Model [id number] Sprint Quattro Secure    Implantable Lead Serial Number [id number]    Implantable Lead Implant Date 78938101    Implantable Lead Location [id number]    Implantable Lead Manufacturer MERM    Implantable Lead Model 5076 CapSureFix Novus    Implantable Lead Serial Number [id number]    Implantable Lead Implant Date 75102585    Implantable Lead Location [id number]    Lead Channel Setting Sensing Sensitivity 0.3 mV   Lead Channel Setting Pacing Amplitude 1.25 V   Lead Channel Setting Pacing Pulse Width 0.4 ms   Lead Channel Setting Pacing Amplitude 1.5 V   Lead Channel Setting Pacing Pulse Width 0.4 ms   Lead Channel Setting Pacing Amplitude 2 V   Lead Channel Setting Pacing Capture Mode Monitor Capture    Lead Channel Impedance Value 456 ohm   Lead Channel Sensing Intrinsic Amplitude 0.875 mV   Lead Channel Sensing Intrinsic Amplitude 0.875 mV   Lead Channel Pacing Threshold Amplitude 0.625 V   Lead Channel Pacing Threshold Pulse Width 0.4 ms   Lead Channel Impedance Value 665 ohm   Lead Channel Impedance Value 608 ohm   Lead Channel Pacing Threshold Amplitude 0.625 V   Lead Channel Pacing Threshold Pulse Width 0.4 ms   HighPow Impedance 53 ohm   HighPow Impedance 67 ohm   Lead Channel Impedance Value 988 ohm   Lead Channel Impedance Value 931 ohm   Lead Channel Impedance Value 988 ohm   Lead Channel Impedance Value 817 ohm   Lead Channel Impedance Value 893 ohm   Lead Channel Impedance Value 874 ohm   Lead Channel Impedance Value 551 ohm   Lead Channel Impedance Value 475 ohm   Lead Channel Impedance Value 513 ohm   Lead Channel Impedance Value 532 ohm   Lead Channel Impedance Value 255.093    Lead Channel Impedance Value 265.661    Lead Channel Impedance Value 270.667    Lead Channel Impedance Value 250.943    Lead Channel Impedance Value 261.164    Lead Channel Pacing Threshold Amplitude 1.125 V   Lead Channel Pacing Threshold Pulse Width 0.4 ms    Battery Status OK    Battery Remaining Longevity 27 mo   Battery Voltage 2.94 V   Brady Statistic RA Percent Paced 0.53 %  Brady Statistic RV Percent Paced 99.98 %   Brady Statistic AP VP Percent 0.51 %   Brady Statistic AS VP Percent 99.47 %   Brady Statistic AP VS Percent 0.01 %   Brady Statistic AS VS Percent 0 %      Assessment & Plan:   Problem List Items Addressed This Visit       Cardiovascular and Mediastinum   Hypertension associated with diabetes ([place])    Chronic, stable with BP at goal today and on home readings.  Continue current medication regimen and collaboration with cardiology.  Recommend checking BP at home at least a few mornings a week + focus on DASH diet.  BMP today.  Return in 3 months.      Relevant Medications   spironolactone (ALDACTONE) 25 MG tablet   sitaGLIPtin (JANUVIA) 50 MG tablet   Other Relevant Orders   Bayer DCA Hb A1c Waived   Microalbumin, Urine Waived   Comprehensive metabolic panel   Idiopathic cardiomyopathy (HCC)    Ongoing, stable, followed by cardiology.  Continue this collaboration.      Relevant Medications   dexlansoprazole (DEXILANT) 60 MG capsule   spironolactone (ALDACTONE) 25 MG tablet   Other Relevant Orders   CBC with Differential/Platelet   Comprehensive metabolic panel   Lipid Panel w/o Chol/HDL Ratio   PSA   Systolic CHF (HCC)    Chronic, stable.  Euvolemic today.  Continue current medication regimen and collaboration with cardiology.  CMP today.   Recommend: - Reminded to call for an overnight weight gain of >2 pounds or a weekly weight weight of >5 pounds - not adding salt to his food and has been reading food labels. Reviewed the importance of keeping daily sodium intake to <2059m daily  - Avoid NSAIDS      Relevant Medications   spironolactone (ALDACTONE) 25 MG tablet   Other Relevant Orders   CBC with Differential/Platelet   Comprehensive metabolic panel   Lipid Panel w/o Chol/HDL Ratio   PSA    Type 2 diabetes mellitus with cardiac complication (HCC) - Primary    Chronic, ongoing with HF.  A1C today 7.5%, upward trend from 5.6% last visit, urine ALB 10 in March 2023.  Trend up since stopping Rybelsus, which caused GI issues.  Continue current medication regimen with FWilder Glade but add on Januvia starting a 50 MG daily and adjust to 100 MG if kidney function continues with eGFR >45 + continue collaboration with cardiology.  Recommend he check BS at home at least a few mornings a week with goal fasting <130, bring to next visit.  Continue to collaborate with CCM crew.  Return to office in 5 weeks.      Relevant Medications   spironolactone (ALDACTONE) 25 MG tablet   sitaGLIPtin (JANUVIA) 50 MG tablet   Other Relevant Orders   Bayer DCA Hb A1c Waived   Microalbumin, Urine Waived   Comprehensive metabolic panel     Endocrine   CKD stage 3 due to type 2 diabetes mellitus ([place]    Ongoing, 3a, improved last visit.  Recheck CMP today and monitor closely. Urine ALB 10 today on check.  Consider referral to nephrology if worsening.      Relevant Medications   sitaGLIPtin (JANUVIA) 50 MG tablet   Other Relevant Orders   Bayer DCA Hb A1c Waived   Microalbumin, Urine Waived   Comprehensive metabolic panel   Hyperlipidemia associated with type 2 diabetes mellitus (HCC)    Chronic, ongoing.  No current medication due to poor tolerance.   Would benefit from statin use with diabetes and heart health or consider injectable, discussed with him today.  Lipid panel up to date.  He is agreeable to trial of Rosuvastatin on 3 day or 1 day a week schedule if LDL >70 in future.      Relevant Medications   spironolactone (ALDACTONE) 25 MG tablet   sitaGLIPtin (JANUVIA) 50 MG tablet   Other Relevant Orders   Bayer DCA Hb A1c Waived   Comprehensive metabolic panel   Lipid Panel w/o Chol/HDL Ratio   Hypothyroidism    Chronic, ongoing.  Continue current medication regimen and adjust as needed based on  labs.  TSH + Free T4 today.      Relevant Medications   levothyroxine (SYNTHROID) 50 MCG tablet   Other Relevant Orders   T4, free   TSH     Genitourinary   BPH (benign prostatic hyperplasia)    Stable, check PSA today.      Relevant Orders   PSA     Other   Biventricular implantable cardioverter-defibrillator in situ    Continue collaboration with cardiology and regular checks.      Gout    Chronic, stable.  Continue Uloric and recheck uric acid today.      Relevant Medications   febuxostat (ULORIC) 40 MG tablet   Other Relevant Orders   Uric acid   Myalgia due to statin    Due to statin therapy, history of on low doses.  Would benefit from statin use with diabetes and heart health or consider injectable, discussed with him today.       Other Visit Diagnoses     Gastroesophageal reflux disease without esophagitis       Relevant Medications   dexlansoprazole (DEXILANT) 60 MG capsule   Other Relevant Orders   Magnesium   Vitamin D deficiency       History of low levels reported, check today and start supplement as needed.   Relevant Orders   VITAMIN D 25 Hydroxy (Vit-D Deficiency, Fractures)   Encounter for annual physical exam       Annual physical with labs today and health maintenance reviewed.       Discussed aspirin prophylaxis for myocardial infarction prevention and decision was made to continue ASA  LABORATORY TESTING:  Health maintenance labs ordered today as discussed above.   The natural history of prostate cancer and ongoing controversy regarding screening and potential treatment outcomes of prostate cancer has been discussed with the patient. The meaning of a false positive PSA and a false negative PSA has been discussed. He indicates understanding of the limitations of this screening test and wishes  to proceed with screening PSA testing.   IMMUNIZATIONS:   - Tdap: Tetanus vaccination status reviewed: last tetanus booster within 10 years. -  Influenza: Refused - Pneumovax: Up to date - Prevnar: Refuses - Zostavax vaccine: Refused  SCREENING: - Colonoscopy: Refused and refuses Cologuard Discussed with patient purpose of the colonoscopy is to detect colon cancer at curable precancerous or early stages   - AAA Screening: Not applicable  -Hearing Test: Not applicable  -Spirometry: Not applicable   PATIENT COUNSELING:    Sexuality: Discussed sexually transmitted diseases, partner selection, use of condoms, avoidance of unintended pregnancy  and contraceptive alternatives.   Advised to avoid cigarette smoking.  I discussed with the patient that most people either abstain from alcohol or drink within safe limits (<=14/week and <=4 drinks/occasion  for males, <=7/weeks and <= 3 drinks/occasion for females) and that the risk for alcohol disorders and other health effects rises proportionally with the number of drinks per week and how often a drinker exceeds daily limits.  Discussed cessation/primary prevention of drug use and availability of treatment for abuse.   Diet: Encouraged to adjust caloric intake to maintain  or achieve ideal body weight, to reduce intake of dietary saturated fat and total fat, to limit sodium intake by avoiding high sodium foods and not adding table salt, and to maintain adequate dietary potassium and calcium preferably from fresh fruits, vegetables, and low-fat dairy products.    Stressed the importance of regular exercise  Injury prevention: Discussed safety belts, safety helmets, smoke detector, smoking near bedding or upholstery.   Dental health: Discussed importance of regular tooth brushing, flossing, and dental visits.   Follow up plan: NEXT PREVENTATIVE PHYSICAL DUE IN 1 YEAR. Return in about 5 weeks (around 08/14/2021) for T2DM.

## 2021-07-10 NOTE — Assessment & Plan Note (Signed)
Ongoing, stable, followed by cardiology.  Continue this collaboration. 

## 2021-07-10 NOTE — Assessment & Plan Note (Signed)
Chronic, ongoing with HF.  A1C today 7.5%, upward trend from 5.6% last visit, urine ALB 10 in March 2023.  Trend up since stopping Rybelsus, which caused GI issues.  Continue current medication regimen with Wilder Glade, but add on Januvia starting a 50 MG daily and adjust to 100 MG if kidney function continues with eGFR >45 + continue collaboration with cardiology.  Recommend he check BS at home at least a few mornings a week with goal fasting <130, bring to next visit.  Continue to collaborate with CCM crew.  Return to office in 5 weeks. ?

## 2021-07-10 NOTE — Assessment & Plan Note (Signed)
Chronic, ongoing.  No current medication due to poor tolerance.   Would benefit from statin use with diabetes and heart health or consider injectable, discussed with him today.  Lipid panel up to date.  He is agreeable to trial of Rosuvastatin on 3 day or 1 day a week schedule if LDL >70 in future. ?

## 2021-07-10 NOTE — Assessment & Plan Note (Addendum)
Ongoing, 3a, improved last visit.  Recheck CMP today and monitor closely. Urine ALB 10 today on check.  Consider referral to nephrology if worsening. ?

## 2021-07-10 NOTE — Assessment & Plan Note (Signed)
Stable, check PSA today. ?

## 2021-07-11 LAB — CBC WITH DIFFERENTIAL/PLATELET
Basophils Absolute: 0.1 10*3/uL (ref 0.0–0.2)
Basos: 1 %
EOS (ABSOLUTE): 0.2 10*3/uL (ref 0.0–0.4)
Eos: 3 %
Hematocrit: 48.2 % (ref 37.5–51.0)
Hemoglobin: 16.1 g/dL (ref 13.0–17.7)
Immature Grans (Abs): 0 10*3/uL (ref 0.0–0.1)
Immature Granulocytes: 0 %
Lymphocytes Absolute: 1.6 10*3/uL (ref 0.7–3.1)
Lymphs: 27 %
MCH: 30.6 pg (ref 26.6–33.0)
MCHC: 33.4 g/dL (ref 31.5–35.7)
MCV: 92 fL (ref 79–97)
Monocytes Absolute: 0.5 10*3/uL (ref 0.1–0.9)
Monocytes: 8 %
Neutrophils Absolute: 3.6 10*3/uL (ref 1.4–7.0)
Neutrophils: 61 %
Platelets: 158 10*3/uL (ref 150–450)
RBC: 5.27 x10E6/uL (ref 4.14–5.80)
RDW: 12.7 % (ref 11.6–15.4)
WBC: 5.9 10*3/uL (ref 3.4–10.8)

## 2021-07-11 LAB — COMPREHENSIVE METABOLIC PANEL
ALT: 30 IU/L (ref 0–44)
AST: 16 IU/L (ref 0–40)
Albumin/Globulin Ratio: 2.2 (ref 1.2–2.2)
Albumin: 4.8 g/dL (ref 3.8–4.8)
Alkaline Phosphatase: 46 IU/L (ref 44–121)
BUN/Creatinine Ratio: 19 (ref 10–24)
BUN: 24 mg/dL (ref 8–27)
Bilirubin Total: 0.6 mg/dL (ref 0.0–1.2)
CO2: 21 mmol/L (ref 20–29)
Calcium: 9.8 mg/dL (ref 8.6–10.2)
Chloride: 100 mmol/L (ref 96–106)
Creatinine, Ser: 1.27 mg/dL (ref 0.76–1.27)
Globulin, Total: 2.2 g/dL (ref 1.5–4.5)
Glucose: 278 mg/dL — ABNORMAL HIGH (ref 70–99)
Potassium: 4.5 mmol/L (ref 3.5–5.2)
Sodium: 137 mmol/L (ref 134–144)
Total Protein: 7 g/dL (ref 6.0–8.5)
eGFR: 61 mL/min/{1.73_m2} (ref >59)

## 2021-07-11 LAB — MAGNESIUM: Magnesium: 2.2 mg/dL (ref 1.6–2.3)

## 2021-07-11 LAB — T4, FREE: Free T4: 1.19 ng/dL (ref 0.82–1.77)

## 2021-07-11 LAB — PSA: Prostate Specific Ag, Serum: 1.1 ng/mL (ref 0.0–4.0)

## 2021-07-11 LAB — VITAMIN D 25 HYDROXY (VIT D DEFICIENCY, FRACTURES): Vit D, 25-Hydroxy: 41.8 ng/mL (ref 30.0–100.0)

## 2021-07-11 LAB — TSH: TSH: 4.63 u[IU]/mL — ABNORMAL HIGH (ref 0.450–4.500)

## 2021-07-11 LAB — LIPID PANEL W/O CHOL/HDL RATIO
Cholesterol, Total: 143 mg/dL (ref 100–199)
HDL: 33 mg/dL — ABNORMAL LOW
LDL Chol Calc (NIH): 70 mg/dL (ref 0–99)
Triglycerides: 242 mg/dL — ABNORMAL HIGH (ref 0–149)
VLDL Cholesterol Cal: 40 mg/dL (ref 5–40)

## 2021-07-11 LAB — URIC ACID: Uric Acid: 3.3 mg/dL — ABNORMAL LOW (ref 3.8–8.4)

## 2021-07-12 ENCOUNTER — Other Ambulatory Visit: Payer: Self-pay | Admitting: Nurse Practitioner

## 2021-07-12 DIAGNOSIS — E039 Hypothyroidism, unspecified: Secondary | ICD-10-CM

## 2021-07-12 NOTE — Progress Notes (Signed)
Contacted via [place] -- needs lab only visit for 4 weeks ? ? ?

## 2021-07-29 ENCOUNTER — Ambulatory Visit (INDEPENDENT_AMBULATORY_CARE_PROVIDER_SITE_OTHER): Payer: Medicare Other

## 2021-07-29 DIAGNOSIS — I428 Other cardiomyopathies: Secondary | ICD-10-CM | POA: Diagnosis not present

## 2021-07-30 LAB — CUP PACEART REMOTE DEVICE CHECK
Battery Remaining Longevity: 28 mo
Battery Voltage: 2.94 V
Brady Statistic AP VP Percent: 4.89 %
Brady Statistic AP VS Percent: 0.01 %
Brady Statistic AS VP Percent: 95.1 %
Brady Statistic AS VS Percent: 0 %
Brady Statistic RA Percent Paced: 4.9 %
Brady Statistic RV Percent Paced: 99.98 %
Date Time Interrogation Session: 20230322022504
HighPow Impedance: 52 Ohm
HighPow Impedance: 69 Ohm
Implantable Lead Implant Date: 20060505
Implantable Lead Implant Date: 20120111
Implantable Lead Implant Date: 20180314
Implantable Lead Location: 753858
Implantable Lead Location: 753859
Implantable Lead Location: 753860
Implantable Lead Model: 5076
Implantable Lead Model: 6947
Implantable Pulse Generator Implant Date: 20180314
Lead Channel Impedance Value: 245.538
Lead Channel Impedance Value: 245.538
Lead Channel Impedance Value: 250.943
Lead Channel Impedance Value: 250.943
Lead Channel Impedance Value: 266 Ohm
Lead Channel Impedance Value: 418 Ohm
Lead Channel Impedance Value: 456 Ohm
Lead Channel Impedance Value: 475 Ohm
Lead Channel Impedance Value: 532 Ohm
Lead Channel Impedance Value: 532 Ohm
Lead Channel Impedance Value: 551 Ohm
Lead Channel Impedance Value: 646 Ohm
Lead Channel Impedance Value: 760 Ohm
Lead Channel Impedance Value: 817 Ohm
Lead Channel Impedance Value: 817 Ohm
Lead Channel Impedance Value: 874 Ohm
Lead Channel Impedance Value: 874 Ohm
Lead Channel Impedance Value: 874 Ohm
Lead Channel Pacing Threshold Amplitude: 0.625 V
Lead Channel Pacing Threshold Amplitude: 0.625 V
Lead Channel Pacing Threshold Amplitude: 1.375 V
Lead Channel Pacing Threshold Pulse Width: 0.4 ms
Lead Channel Pacing Threshold Pulse Width: 0.4 ms
Lead Channel Pacing Threshold Pulse Width: 0.4 ms
Lead Channel Sensing Intrinsic Amplitude: 1.5 mV
Lead Channel Sensing Intrinsic Amplitude: 1.5 mV
Lead Channel Setting Pacing Amplitude: 1.25 V
Lead Channel Setting Pacing Amplitude: 1.5 V
Lead Channel Setting Pacing Amplitude: 2 V
Lead Channel Setting Pacing Pulse Width: 0.4 ms
Lead Channel Setting Pacing Pulse Width: 0.4 ms
Lead Channel Setting Sensing Sensitivity: 0.3 mV

## 2021-08-08 NOTE — Patient Instructions (Signed)

## 2021-08-11 ENCOUNTER — Telehealth: Payer: Self-pay

## 2021-08-11 NOTE — Chronic Care Management (AMB) (Signed)
? ? ?  Chronic Care Management ?Pharmacy Assistant  ? ?Name: Ryan [name]  [name]: [id number] DOB: 17-Jun-1950 ? ?Reason for Encounter: Disease State General ? ?Recent office visits:  ?07/10/21-Ryan T. Ned Card, NP (PCP) Seen for medical examination. Labs ordered. Start on Januvia 50 mg and adjust to 100 MG if kidney function continues with eGFR >45 + continue collaboration with cardiology. Follow up in 5 weeks. ?04/10/21-Ryan T. Ned Card, NP (PCP) General follow up visit. Labs ordered. Follow up in 3 months. ? ?Recent consult visits:  ?04/23/21-Ryan Isac Caddy, MD (Dermatology) Seen for annual exam. Follow up in 1 year. ?04/14/21-Ryan Peterson [place], MD (Cardiology) Seen in followup for an ICD implanted for syncope in the setting of a nonischemic cardiomyopathy. Follow up in 6 months. ? ?Hospital visits:  ?None in previous 6 months ? ?Medications: ?Outpatient Encounter Medications as of 08/11/2021  ?Medication Sig  ? carvedilol (COREG) 12.5 MG tablet Take 1 tablet (12.5 mg total) by mouth 2 (two) times daily.  ? Cholecalciferol (VITAMIN D3 SUPER STRENGTH) 50 MCG (2000 UT) CAPS Take 2,000 Units by mouth daily.  ? dapagliflozin propanediol (FARXIGA) 10 MG TABS tablet Take 1 tablet (10 mg total) by mouth daily before breakfast.  ? dexlansoprazole (DEXILANT) 60 MG capsule Take 1 capsule (60 mg total) by mouth daily.  ? febuxostat (ULORIC) 40 MG tablet Take 1 tablet (40 mg total) by mouth daily.  ? levothyroxine (SYNTHROID) 50 MCG tablet Take 1 tablet (50 mcg total) by mouth daily.  ? sacubitril-valsartan (ENTRESTO) 24-26 MG Take 1 tablet by mouth 2 (two) times daily.  ? sitaGLIPtin (JANUVIA) 50 MG tablet Take 1 tablet (50 mg total) by mouth daily.  ? spironolactone (ALDACTONE) 25 MG tablet Take 0.5 tablets (12.5 mg total) by mouth daily.  ? ?No facility-administered encounter medications on file as of 08/11/2021.  ? ?Have you had any problems recently with your health? ?Patient states he has no problems with his health at this  time. ? ?Have you had any problems with your pharmacy? ?Patient states he has no problems with his pharmacy. ? ?What issues or side effects are you having with your medications? ?Patient states he has no issues or side effects to any of his medications. ? ?What would you like me to pass along to Ryan [name],CPP for them to help you with?  ?Patient states there is nothing at this time. ? ?What can we do to take care of you better? ?Patient states there is nothing at this time. ? ?Care Gaps: ?Pneumonia Vaccine:Last completed: Apr 21, 2005 ?OPHTHALMOLOGY EXAM:Last completed: Jun 03, 2020 ? ?Star Rating Drugs: ?Sacubitril-valsartan 24-26 mg Last filled:05/05/21 30 DS ?Farxiga 10 mg Last filled:None noted ? ?Ryan [name], RMA ?Health Concierge ? ?

## 2021-08-12 NOTE — Progress Notes (Signed)
Remote ICD transmission.   

## 2021-08-14 ENCOUNTER — Ambulatory Visit (INDEPENDENT_AMBULATORY_CARE_PROVIDER_SITE_OTHER): Payer: Medicare Other | Admitting: Nurse Practitioner

## 2021-08-14 ENCOUNTER — Encounter: Payer: Self-pay | Admitting: Nurse Practitioner

## 2021-08-14 VITALS — BP 114/68 | HR 77 | Temp 98.0°F | Ht 72.5 in | Wt 202.0 lb

## 2021-08-14 DIAGNOSIS — E1159 Type 2 diabetes mellitus with other circulatory complications: Secondary | ICD-10-CM

## 2021-08-14 DIAGNOSIS — E039 Hypothyroidism, unspecified: Secondary | ICD-10-CM

## 2021-08-14 NOTE — Assessment & Plan Note (Signed)
Chronic, ongoing.  Continue current medication regimen and adjust as needed based on labs.  TSH + Free T4 today due to elevation recent check. ?

## 2021-08-14 NOTE — Assessment & Plan Note (Signed)
Chronic, ongoing with HF.  A1c 7.5% recent visit, upward trend from 5.6% last visit, urine ALB 10 in March 2023.  Continue current medication regimen with Farxiga and Januvia 50 MG daily and adjust to 100 MG if kidney function continues with eGFR >45 + continue collaboration with cardiology (is tolerating Januvia well).  Recommend he check BS at home at least a few mornings a week with goal fasting <130, bring to next visit.  Continue to collaborate with CCM crew.  Return to office in June for A1c check. ?

## 2021-08-14 NOTE — Progress Notes (Signed)
? ?BP 114/68 (BP Location: Left Arm)   Pulse 77   Temp 98 ?F (36.7 ?C) (Oral)   Ht 6' 0.5" (1.842 m)   Wt 202 lb (91.6 kg)   SpO2 98%   BMI 27.02 kg/m?   ? ?Subjective:  ? ? Patient ID: Ryan [name], male    DOB: 1950/08/04, 71 y.o.   [name]: [id number] ? ?HPI: ?Ryan [name] is a 71 y.o. male ? ?Chief Complaint  ?Patient presents with  ? Diabetes  ?  Patient is here for follow up Diabetes. Patient states he is doing well with Diabetes. Patient states he has an upcoming appointment in two weeks for Diabetic Eye Exam.   ? ?DIABETES ?Last A1c 7.5% in March, added on Januvia as did not tolerate Rybelsus.  Takes Wilder Glade (gets via assistance).  Has been tolerating Januvia without issues. Had intolerance to Metformin.  Prefers no injectables.  Recent TSH was 4.630.   ?Hypoglycemic episodes:no ?Polydipsia/polyuria: no ?Visual disturbance: no ?Chest pain: no ?Paresthesias: no ?Glucose Monitoring: no ?            Accucheck frequency: Not Checking ?            Fasting glucose: ?            Post prandial: ?            Evening: ?            Before meals: ?Taking Insulin?: no ?            Long acting insulin: ?            Short acting insulin: ?Blood Pressure Monitoring: daily ?Retinal Examination: Up to Date had in March with [facility] ?Foot Exam: Up to Date ?Pneumovax: refuses ?Influenza:  refused ?Aspirin: no  ? ?Relevant past medical, surgical, family and social history reviewed and updated as indicated. Interim medical history since our last visit reviewed. ?Allergies and medications reviewed and updated. ? ?Review of Systems  ?Constitutional:  Negative for activity change, diaphoresis, fatigue and fever.  ?Respiratory:  Negative for cough, chest tightness, shortness of breath and wheezing.   ?Cardiovascular:  Negative for chest pain, palpitations and leg swelling.  ?Gastrointestinal: Negative.   ?Endocrine: Negative for polydipsia, polyphagia and polyuria.  ?Neurological: Negative.   ?Psychiatric/Behavioral: Negative.     ? ?Per HPI unless specifically indicated above ? ?   ?Objective:  ?  ?BP 114/68 (BP Location: Left Arm)   Pulse 77   Temp 98 ?F (36.7 ?C) (Oral)   Ht 6' 0.5" (1.842 m)   Wt 202 lb (91.6 kg)   SpO2 98%   BMI 27.02 kg/m?   ?Wt Readings from Last 3 Encounters:  ?08/14/21 202 lb (91.6 kg)  ?07/10/21 197 lb 9.6 oz (89.6 kg)  ?04/14/21 190 lb (86.2 kg)  ?  ?Physical Exam ?Vitals and nursing note reviewed.  ?Constitutional:   ?   General: He is awake. He is not in acute distress. ?   Appearance: He is well-developed and well-groomed. He is not ill-appearing.  ?HENT:  ?   Head: Normocephalic and atraumatic.  ?   Right Ear: Hearing normal. No drainage.  ?   Left Ear: Hearing normal. No drainage.  ?Eyes:  ?   General: Lids are normal.     ?   Right eye: No discharge.     ?   Left eye: No discharge.  ?   Conjunctiva/sclera: Conjunctivae normal.  ?   Pupils: Pupils are  equal, round, and reactive to light.  ?Neck:  ?   Vascular: No carotid bruit.  ?Cardiovascular:  ?   Rate and Rhythm: Normal rate and regular rhythm.  ?   Heart sounds: Normal heart sounds, S1 normal and S2 normal. No murmur heard. ?  No gallop.  ?Pulmonary:  ?   Effort: Pulmonary effort is normal. No accessory muscle usage or respiratory distress.  ?   Breath sounds: Normal breath sounds.  ?Abdominal:  ?   General: Bowel sounds are normal.  ?   Palpations: Abdomen is soft.  ?Musculoskeletal:     ?   General: Normal range of motion.  ?   Cervical back: Normal range of motion and neck supple.  ?   Right lower leg: No edema.  ?   Left lower leg: No edema.  ?Skin: ?   General: Skin is warm and dry.  ?   Capillary Refill: Capillary refill takes less than 2 seconds.  ?Neurological:  ?   Mental Status: He is alert and oriented to person, place, and time.  ?Psychiatric:     ?   Attention and Perception: Attention normal.     ?   Mood and Affect: Mood normal.     ?   Speech: Speech normal.     ?   Behavior: Behavior normal. Behavior is cooperative.     ?   Thought  Content: Thought content normal.  ? ? ?Results for orders placed or performed in visit on 07/29/21  ?CUP PACEART REMOTE DEVICE CHECK  ?Result Value Ref Range  ? Date Time Interrogation Session [phone]   ? Pulse Insurance claims handler MERM   ? Pulse Gen Model [id number] Claria MRI Quad CRT-D   ? Pulse Gen Serial Number [id number]   ? Clinic Name [facility]   ? Implantable Pulse Generator Type Cardiac Resynch Therapy Defibulator   ? Implantable Pulse Generator Implant Date [id number]   ? Implantable Lead Manufacturer SJCR   ? Implantable Lead Model [place]   ? Implantable Lead Serial Number [id number]   ? Implantable Lead Implant Date [id number]   ? Implantable Lead Location Detail 1 UNKNOWN   ? Implantable Lead Location [id number]   ? Implantable Lead Manufacturer MERM   ? Implantable Lead Model [id number] Sprint Quattro Secure   ? Implantable Lead Serial Number [id number]   ? Implantable Lead Implant Date [id number]   ? Implantable Lead Location [phone]   ? Implantable Lead Manufacturer MERM   ? Implantable Lead Model 5076 CapSureFix Novus   ? Implantable Lead Serial Number [id number]   ? Implantable Lead Implant Date [id number]   ? Implantable Lead Location [id number]   ? Lead Channel Setting Sensing Sensitivity 0.3 mV  ? Lead Channel Setting Pacing Amplitude 1.25 V  ? Lead Channel Setting Pacing Pulse Width 0.4 ms  ? Lead Channel Setting Pacing Amplitude 1.5 V  ? Lead Channel Setting Pacing Pulse Width 0.4 ms  ? Lead Channel Setting Pacing Amplitude 2 V  ? Lead Channel Setting Pacing Capture Mode Monitor Capture   ? Lead Channel Impedance Value 418 ohm  ? Lead Channel Sensing Intrinsic Amplitude 1.5 mV  ? Lead Channel Sensing Intrinsic Amplitude 1.5 mV  ? Lead Channel Pacing Threshold Amplitude 0.625 V  ? Lead Channel Pacing Threshold Pulse Width 0.4 ms  ? Lead Channel Impedance Value 646 ohm  ? Lead Channel Impedance Value 551 ohm  ? Lead Channel Pacing Threshold Amplitude 0.625 V  ? Lead Channel Pacing Threshold  Pulse  Width 0.4 ms  ? HighPow Impedance 52 ohm  ? HighPow Impedance 69 ohm  ? Lead Channel Impedance Value 817 ohm  ? Lead Channel Impedance Value 874 ohm  ? Lead Channel Impedance Value 874 ohm  ? Lead Channel Impedance Value 760 ohm  ? Lead Channel Impedance Value 817 ohm  ? Lead Channel Impedance Value 874 ohm  ? Lead Channel Impedance Value 532 ohm  ? Lead Channel Impedance Value 456 ohm  ? Lead Channel Impedance Value 475 ohm  ? Lead Channel Impedance Value 532 ohm  ? Lead Channel Impedance Value 245.538   ? Lead Channel Impedance Value 250.943   ? Lead Channel Impedance Value 266 ohm  ? Lead Channel Impedance Value 245.538   ? Lead Channel Impedance Value 250.943   ? Lead Channel Pacing Threshold Amplitude 1.375 V  ? Lead Channel Pacing Threshold Pulse Width 0.4 ms  ? Battery Status OK   ? Battery Remaining Longevity 28 mo  ? Battery Voltage 2.94 V  ? Loletha Grayer Statistic RA Percent Paced 4.9 %  ? Brady Statistic RV Percent Paced 99.98 %  ? Brady Statistic AP VP Percent 4.89 %  ? Brady Statistic AS VP Percent 95.1 %  ? Brady Statistic AP VS Percent 0.01 %  ? Brady Statistic AS VS Percent 0 %  ? ?   ?Assessment & Plan:  ? ?Problem List Items Addressed This Visit   ? ?  ? Cardiovascular and Mediastinum  ? Type 2 diabetes mellitus with cardiac complication (HCC) - Primary  ?  Chronic, ongoing with HF.  A1c 7.5% recent visit, upward trend from 5.6% last visit, urine ALB 10 in March 2023.  Continue current medication regimen with Farxiga and Januvia 50 MG daily and adjust to 100 MG if kidney function continues with eGFR >45 + continue collaboration with cardiology (is tolerating Januvia well).  Recommend he check BS at home at least a few mornings a week with goal fasting <130, bring to next visit.  Continue to collaborate with CCM crew.  Return to office in June for A1c check. ?  ?  ?  ? Endocrine  ? Hypothyroidism  ?  Chronic, ongoing.  Continue current medication regimen and adjust as needed based on labs.  TSH + Free T4  today due to elevation recent check. ?  ?  ? Relevant Orders  ? T4, free  ? TSH  ?  ? ?Follow up plan: ?Return in about 2 months (around 10/19/2021) for T2DM, HTN/HLD, THYROID. ? ? ? ? ? ?

## 2021-08-15 LAB — T4, FREE: Free T4: 1.2 ng/dL (ref 0.82–1.77)

## 2021-08-15 LAB — TSH: TSH: 4.4 u[IU]/mL (ref 0.450–4.500)

## 2021-08-15 NOTE — Progress Notes (Signed)
Contacted via [place] ? ? ?Good morning [name], your thyroid labs have returned and are back to normal levels -- continue current Levothyroxine dosing.  Any questions? ?Keep being amazing!!  Thank you for allowing me to participate in your care.  I appreciate you. ?Kindest regards, ?Anaisha Mago ?

## 2021-08-18 ENCOUNTER — Telehealth: Payer: Medicare Other

## 2021-08-18 ENCOUNTER — Ambulatory Visit (INDEPENDENT_AMBULATORY_CARE_PROVIDER_SITE_OTHER): Payer: Medicare Other

## 2021-08-18 DIAGNOSIS — E1159 Type 2 diabetes mellitus with other circulatory complications: Secondary | ICD-10-CM

## 2021-08-18 DIAGNOSIS — I5022 Chronic systolic (congestive) heart failure: Secondary | ICD-10-CM

## 2021-08-18 DIAGNOSIS — E1169 Type 2 diabetes mellitus with other specified complication: Secondary | ICD-10-CM

## 2021-08-18 NOTE — Chronic Care Management (AMB) (Signed)
?Chronic Care Management  ? ?CCM RN Visit Note ? ?08/18/2021 ?Name: Ryan [name] [name]: [id number] DOB: [birth year] ? ?Subjective: ?Ryan [name] is a 71 y.o. year old male who is a primary care patient of Cannady, Barbaraann Faster, NP. The care management team was consulted for assistance with disease management and care coordination needs.   ? ?Engaged with patient by telephone for follow up visit in response to provider referral for case management and/or care coordination services.  ? ?Consent to Services:  ?The patient was given information about Chronic Care Management services, agreed to services, and gave verbal consent prior to initiation of services.  Please see initial visit note for detailed documentation.  ? ?Patient agreed to services and verbal consent obtained.  ? ?Assessment: Review of patient past medical history, allergies, medications, health status, including review of consultants reports, laboratory and other test data, was performed as part of comprehensive evaluation and provision of chronic care management services.  ? ?SDOH (Social Determinants of Health) assessments and interventions performed:   ? ?CCM Care Plan ? ?Allergies  ?Allergen Reactions  ? Ace Inhibitors   ?  Muscle aches   ? Crestor [Rosuvastatin Calcium] Other (See Comments)  ?  Muscle aches  ? ? ?Outpatient Encounter Medications as of 08/18/2021  ?Medication Sig  ? carvedilol (COREG) 12.5 MG tablet Take 1 tablet (12.5 mg total) by mouth 2 (two) times daily.  ? Cholecalciferol (VITAMIN D3 SUPER STRENGTH) 50 MCG (2000 UT) CAPS Take 2,000 Units by mouth daily.  ? dapagliflozin propanediol (FARXIGA) 10 MG TABS tablet Take 1 tablet (10 mg total) by mouth daily before breakfast.  ? dexlansoprazole (DEXILANT) 60 MG capsule Take 1 capsule (60 mg total) by mouth daily.  ? febuxostat (ULORIC) 40 MG tablet Take 1 tablet (40 mg total) by mouth daily.  ? levothyroxine (SYNTHROID) 50 MCG tablet Take 1 tablet (50 mcg total) by mouth daily.  ?  sacubitril-valsartan (ENTRESTO) 24-26 MG Take 1 tablet by mouth 2 (two) times daily.  ? sitaGLIPtin (JANUVIA) 50 MG tablet Take 1 tablet (50 mg total) by mouth daily.  ? spironolactone (ALDACTONE) 25 MG tablet Take 0.5 tablets (12.5 mg total) by mouth daily.  ? ?No facility-administered encounter medications on file as of 08/18/2021.  ? ? ?Patient Active Problem List  ? Diagnosis Date Noted  ? CKD stage 3 due to type 2 diabetes mellitus ([place]) 08/30/2019  ? Type 2 diabetes mellitus with cardiac complication ([place]) 45/85/9292  ? Hypothyroidism 05/26/2019  ? Advanced care planning/counseling discussion 04/27/2017  ? Systolic CHF ([place]) 44/62/8638  ? BPH (benign prostatic hyperplasia) 04/14/2016  ? Hypertension associated with diabetes ([place]) 12/16/2014  ? Gout 12/16/2014  ? Hyperlipidemia associated with type 2 diabetes mellitus ([place]) 06/13/2012  ? Biventricular implantable cardioverter-defibrillator in situ 09/28/2010  ? Idiopathic cardiomyopathy ([place]) 01/27/2009  ? Myalgia due to statin 01/27/2009  ? ? ?Conditions to be addressed/monitored:CHF, HTN, HLD, and DMII ? ?Care Plan : RNCM: General Plan of Care (Adult) for Chronic Disease Management and Care Coordination Needs  ?Updates made by Vanita Ingles, RN since 08/18/2021 12:00 AM  ?  ? ?Problem: RNCM: Development of Plan of Care for Chronic Disease Management (CHF, HTN, HLD, DM)   ?Priority: High  ?  ? ?Long-Range Goal: RNCM: Effective Management  of Plan of Care for Chronic Disease Management (CHF, HTN, HLD, DM)   ?Start Date: 04/15/2021  ?Expected End Date: 04/15/2022  ?Priority: High  ?Note:   ?Current Barriers:  ?Knowledge Deficits related  to plan of care for management of CHF, HTN, HLD, and DMII  ?Chronic Disease Management support and education needs related to CHF, HTN, HLD, and DMII ? ?RNCM Clinical Goal(s):  ?Patient will verbalize basic understanding of CHF, HTN, HLD, and DMII disease process and self health management plan as evidenced by keeping appointments,  following plan of care, calling the office for questions or concerns, and working with the pcp and CCM team to optimize the plan of care and effectively manage health and well being  ?take all medications exactly as prescribed and will call provider for medication related questions as evidenced by compliance with medications and calling for refills before running out of medications     ?attend all scheduled medical appointments: 10-23-2021 at 0900 am with pcp as evidenced by keeping appointments and calling for schedule change needs         ?demonstrate improved and ongoing adherence to prescribed treatment plan for CHF, HTN, HLD, and DMII as evidenced by stable conditions, seeing specialist as directed, and calling for changes in condition or questions  ?demonstrate ongoing self health care management ability for effective management of chronic conditions  as evidenced by working with the CCM team through collaboration with Consulting civil engineer, provider, and care team.  ? ?Interventions: ?1:1 collaboration with primary care provider regarding development and update of comprehensive plan of care as evidenced by provider attestation and co-signature ?Inter-disciplinary care team collaboration (see longitudinal plan of care) ?Evaluation of current treatment plan related to  self management and patient's adherence to plan as established by provider ? ? ?Heart Failure Interventions:  (Status: Goal on Track (progressing): YES.)  Long Term Goal  ?Basic overview and discussion of pathophysiology of Heart Failure reviewed ?Provided education on low sodium diet. 08-18-2021: Review of a heart healthy/ADA diet. The patient states that breakfast is his biggest meal of the day and usually he eats a sandwich at lunch time and a meat with a vegetable at supper. He tries to monitor his intake well but admits sometimes he gets a pizza. Education and support given.  ?Reviewed Heart Failure Action Plan in depth and provided written copy.  06-16-2021: The patient states that he saw his cardiologist in December and his cardiologist stated he had 2 years and 2 months left on his device. The patient states that he has actually gained time on it and that makes him happy. He has a recorder at home that he uses. The patient denies any changes in his conditions and has been approved for assistance with getting his Delene Loll until the end of 2023. Education and support given. 08-18-2021: The patient is compliant with the plan of care for his heart failure. Sees specialist on a regular basis.  ?Assessed need for readable accurate scales in home ?Provided education about placing scale on hard, flat surface ?Advised patient to weigh each morning after emptying bladder ?Discussed importance of daily weight and advised patient to weigh and record daily ?Reviewed role of diuretics in prevention of fluid overload and management of heart failure ?Discussed the importance of keeping all appointments with provider.pcp appointment on 10-23-2021  ?Provided patient with education about the role of exercise in the management of heart failure ?Advised patient to discuss acute changes of HF with provider ?Screening for signs and symptoms of depression related to chronic disease state  ?Assessed social determinant of health barriers ?EF% 40-45 % ? ?Diabetes:  (Status: Goal on Track (progressing): YES.) Long Term Goal  ? ?Lab Results  ?Component  Value Date  ? HGBA1C 7.5 (H) 07/10/2021  ?Previous: 5.6% on 04-10-2021 ?Assessed patient's understanding of A1c goal: <7%. 08-18-2021: Review of goal of A1C being <7.0% ?Provided education to patient about basic DM disease process. 08-18-2021: the patient is aware of how to effectively manage DM. He has not been able to tolerate some of the medications for his DM. He is hopeful that his A1C in June will be under 7.0%. Education and support given. ; ?Reviewed medications with patient and discussed importance of medication adherence. 04-15-2021:  The patient sent a my chart message to the pcp on 04-14-2021.  The patient has been having increased heartburn and GI symptoms for over a week and he feels like it is caused by the Rybelsus. The patient stopped ta

## 2021-08-18 NOTE — Patient Instructions (Signed)
Visit Information ? ?Thank you for taking time to visit with me today. Please don't hesitate to contact me if I can be of assistance to you before our next scheduled telephone appointment. ? ?Following are the goals we discussed today:  ?RNCM Clinical Goal(s):  ?Patient will verbalize basic understanding of CHF, HTN, HLD, and DMII disease process and self health management plan as evidenced by keeping appointments, following plan of care, calling the office for questions or concerns, and working with the pcp and CCM team to optimize the plan of care and effectively manage health and well being  ?take all medications exactly as prescribed and will call provider for medication related questions as evidenced by compliance with medications and calling for refills before running out of medications     ?attend all scheduled medical appointments: 10-23-2021 at 0900 am with pcp as evidenced by keeping appointments and calling for schedule change needs         ?demonstrate improved and ongoing adherence to prescribed treatment plan for CHF, HTN, HLD, and DMII as evidenced by stable conditions, seeing specialist as directed, and calling for changes in condition or questions  ?demonstrate ongoing self health care management ability for effective management of chronic conditions  as evidenced by working with the CCM team through collaboration with Consulting civil engineer, provider, and care team.  ?  ?Interventions: ?1:1 collaboration with primary care provider regarding development and update of comprehensive plan of care as evidenced by provider attestation and co-signature ?Inter-disciplinary care team collaboration (see longitudinal plan of care) ?Evaluation of current treatment plan related to  self management and patient's adherence to plan as established by provider ?  ?  ?Heart Failure Interventions:  (Status: Goal on Track (progressing): YES.)  Long Term Goal  ?Basic overview and discussion of pathophysiology of Heart Failure  reviewed ?Provided education on low sodium diet. 08-18-2021: Review of a heart healthy/ADA diet. The patient states that breakfast is his biggest meal of the day and usually he eats a sandwich at lunch time and a meat with a vegetable at supper. He tries to monitor his intake well but admits sometimes he gets a pizza. Education and support given.  ?Reviewed Heart Failure Action Plan in depth and provided written copy. 06-16-2021: The patient states that he saw his cardiologist in December and his cardiologist stated he had 2 years and 2 months left on his device. The patient states that he has actually gained time on it and that makes him happy. He has a recorder at home that he uses. The patient denies any changes in his conditions and has been approved for assistance with getting his Delene Loll until the end of 2023. Education and support given. 08-18-2021: The patient is compliant with the plan of care for his heart failure. Sees specialist on a regular basis.  ?Assessed need for readable accurate scales in home ?Provided education about placing scale on hard, flat surface ?Advised patient to weigh each morning after emptying bladder ?Discussed importance of daily weight and advised patient to weigh and record daily ?Reviewed role of diuretics in prevention of fluid overload and management of heart failure ?Discussed the importance of keeping all appointments with provider.pcp appointment on 10-23-2021  ?Provided patient with education about the role of exercise in the management of heart failure ?Advised patient to discuss acute changes of HF with provider ?Screening for signs and symptoms of depression related to chronic disease state  ?Assessed social determinant of health barriers ?EF% 40-45 % ?  ?  Diabetes:  (Status: Goal on Track (progressing): YES.) Long Term Goal  ?  ?     ?Lab Results  ?Component Value Date  ?  HGBA1C 7.5 (H) 07/10/2021  ?Previous: 5.6% on 04-10-2021 ?Assessed patient's understanding of A1c  goal: <7%. 08-18-2021: Review of goal of A1C being <7.0% ?Provided education to patient about basic DM disease process. 08-18-2021: the patient is aware of how to effectively manage DM. He has not been able to tolerate some of the medications for his DM. He is hopeful that his A1C in June will be under 7.0%. Education and support given. ; ?Reviewed medications with patient and discussed importance of medication adherence. 04-15-2021: The patient sent a my chart message to the pcp on 04-14-2021.  The patient has been having increased heartburn and GI symptoms for over a week and he feels like it is caused by the Rybelsus. The patient stopped taking and has not taken for 2 days. He feels much better and was able to sleep last night. Per pcp note monitoring will continue and a new A1C will be obtained at March visit. Reviewed with the patient to call the office for questions or concerns or consistently low blood sugars or high blood sugars. The patient has a good understanding of effective management of his DM. Verbalized understanding. Wanted to be able to take the Rybelsus but feels this was causing him to have more issues. Emotional listening and support given. 06-16-2021: The patient states that he had to stop taking the Rybelsus because it caused him to have bad acid reflux. The patient says this was really helping him keep his A1C down. The patient states that he really was hoping he could tolerate the medications but his symptoms just kept getting worse. He has discussed this with [name] and will follow up with her in March. Education and support given. 08-18-2021: The patient is no longer taking the Rybelsus but states he has started on the jardiance. The patient states he has gained weight since coming off of the Rybelsus because it curbed his appetite. He has gone from 189 to 75. He denies this being water weight.  ;        ?Reviewed prescribed diet with patient heart healthy/ADA diet. 08-18-2021: Review and support  given. Review for monitoring carbohydrates and concentrated sweets ; ?Counseled on importance of regular laboratory monitoring as prescribed. 08-18-2021: The patient will have a new A1C in June.        ?Discussed plans with patient for ongoing care management follow up and provided patient with direct contact information for care management team;      ?Provided patient with written educational materials related to hypo and hyperglycemia and importance of correct treatment. 06-16-2021: The patient denies any lows at this time.   The patient states he has not been checking his blood sugar consistently. He did take it this am and it was 190. Review of sx and sx of hypoglycemia and hyperglycemia.  08-18-2021: Denies any lows or highs. Has not been checking on a consistent basis. Did take his reading on Saturday and Sunday.     ?Reviewed scheduled/upcoming provider appointments including: 10-23-2021 at 0820 am;         ?Advised patient, providing education and rationale, to check cbg when you have symptoms of low or high blood sugar, before and after exercise, and as directed by the pcp  and record . 04-15-2021: The patient states he is getting readings of 111 to 130, denies and extreme  lows or highs. Will continue to monitor.   06-16-2021: The patient is not consistently checking his blood sugars. He states he his blood sugar this am was 190. Education and support given. 08-18-2021: The patient has not been checking but after seeing the pcp on Friday he is committed to checking his blood sugars at least 3 times a week. Readings on 08-15-2021 was 170 and 08-16-2021 was 190. Review with the patient the goal of blood sugars <130 fasting and post prandial <180. The patient is thankful for the CCM team helping him with maintaining his health and well being. Will continue to monitor for changes and new needs.   ?call provider for findings outside established parameters;       ?Review of patient status, including review of consultants  reports, relevant laboratory and other test results, and medications completed;       ?Advised patient to discuss questions or concerns  with provider;      ?Last eye exam was January 2022. The patient states

## 2021-09-06 DIAGNOSIS — E785 Hyperlipidemia, unspecified: Secondary | ICD-10-CM | POA: Diagnosis not present

## 2021-09-06 DIAGNOSIS — E1169 Type 2 diabetes mellitus with other specified complication: Secondary | ICD-10-CM

## 2021-09-06 DIAGNOSIS — I152 Hypertension secondary to endocrine disorders: Secondary | ICD-10-CM | POA: Diagnosis not present

## 2021-09-06 DIAGNOSIS — E1159 Type 2 diabetes mellitus with other circulatory complications: Secondary | ICD-10-CM | POA: Diagnosis not present

## 2021-09-06 DIAGNOSIS — I5022 Chronic systolic (congestive) heart failure: Secondary | ICD-10-CM | POA: Diagnosis not present

## 2021-09-21 ENCOUNTER — Ambulatory Visit (INDEPENDENT_AMBULATORY_CARE_PROVIDER_SITE_OTHER): Payer: Medicare Other

## 2021-09-21 ENCOUNTER — Telehealth: Payer: Self-pay

## 2021-09-21 DIAGNOSIS — E1169 Type 2 diabetes mellitus with other specified complication: Secondary | ICD-10-CM

## 2021-09-21 DIAGNOSIS — M791 Myalgia, unspecified site: Secondary | ICD-10-CM

## 2021-09-21 DIAGNOSIS — E1159 Type 2 diabetes mellitus with other circulatory complications: Secondary | ICD-10-CM

## 2021-09-21 NOTE — Progress Notes (Signed)
? ? ?Chronic Care Management ?Pharmacy Note ? ?09/21/2021 ?Name:  Ryan [name] [name]:  [id number] DOB:  March 24, 1951 ? ?Summary: ?-FBGs 160s-170s, staying active outdoors. Review DM diet/provided recommendations ?-Offered to provide financial support on Januvia 100 mg (dose increase) pap if ok w/ pcp, can include hold instructions if needed. Next PCP visit 10/23/21 ? ?Plan: ?-will send above pap as appropriate  ?-HC support prn, 55mtel f/u with CP  ? ?Subjective: ?Ryan WRIGHTSMANis an 71y.o. year old male who is a primary patient of Cannady, JBarbaraann Faster NP.  The CCM team was consulted for assistance with disease management and care coordination needs.   ? ?Engaged with patient by telephone for follow up visit in response to provider referral for pharmacy case management and/or care coordination services.  ? ?Consent to Services:  ?The patient was given information about Chronic Care Management services, agreed to services, and gave verbal consent prior to initiation of services.  Please see initial visit note for detailed documentation.  ? ?Patient Care Team: ?CVenita Lick NP as PCP - General (Nurse Practitioner) ?VCarloyn Manner MD as Referring Physician (Otolaryngology) ?KDeboraha Sprang MD as Consulting Physician (Cardiology) ?TVanita Ingles RN as Case Manager (General Practice) ?PMadelin Rear RMem[facility] Pinecroft[facility] Pharmacist (Pharmacist) ? ?Objective: ? ?Lab Results  ?Component Value Date  ? CREATININE 1.27 07/10/2021  ? CREATININE 1.38 (H) 04/10/2021  ? CREATININE 1.24 01/09/2021  ? ? ?Lab Results  ?Component Value Date  ? HGBA1C 7.5 (H) 07/10/2021  ? ?Last diabetic Eye exam:  ?Lab Results  ?Component Value Date/Time  ? HMDIABEYEEXA No Retinopathy 06/03/2020 12:00 AM  ?  ?Last diabetic Foot exam: No results found for: HMDIABFOOTEX  ? ?   ?Component Value Date/Time  ? CHOL 143 07/10/2021 0816  ? CHOL 134 01/10/2019 1055  ? TRIG 242 (H) 07/10/2021 0816  ? TRIG 260 (H) 01/10/2019 1055  ? HDL 33 (L) 07/10/2021 0816  ? CHOLHDL  4.0 05/24/2018 1114  ? VLDL 52 (H) 01/10/2019 1055  ? LVaiden70 07/10/2021 0816  ? ? ? ?  Latest Ref Rng & Units 07/10/2021  ?  8:16 AM 05/29/2019  ? 10:05 AM 01/10/2019  ? 10:55 AM  ?Hepatic Function  ?Total Protein 6.0 - 8.5 g/dL 7.0   7.2     ?Albumin 3.8 - 4.8 g/dL 4.8   4.8     ?AST 0 - 40 IU/L '16   19   25    ' ?ALT 0 - 44 IU/L 30   24   35    ?Alk Phosphatase 44 - 121 IU/L 46   49     ?Total Bilirubin 0.0 - 1.2 mg/dL 0.6   0.9     ? ? ?Lab Results  ?Component Value Date/Time  ? TSH 4.400 08/14/2021 10:42 AM  ? TSH 4.630 (H) 07/10/2021 08:16 AM  ? FREET4 1.20 08/14/2021 10:42 AM  ? FREET4 1.19 07/10/2021 08:16 AM  ? ? ? ?  Latest Ref Rng & Units 07/10/2021  ?  8:16 AM 05/29/2019  ? 10:05 AM 05/24/2018  ? 11:14 AM  ?CBC  ?WBC 3.4 - 10.8 x10E3/uL 5.9   7.1   7.1    ?Hemoglobin 13.0 - 17.7 g/dL 16.1   16.6   16.3    ?Hematocrit 37.5 - 51.0 % 48.2   47.4   46.4    ?Platelets 150 - 450 x10E3/uL 158   197   186    ? ? ?Lab Results  ?  Component Value Date/Time  ? VD25OH 41.8 07/10/2021 08:16 AM  ? ? ?Clinical ASCVD:  ?The 10-year ASCVD risk score (Arnett DK, et al., 2019) is: 31.2% ?  Values used to calculate the score: ?    Age: 71 years ?    Sex: Male ?    Is Non-Hispanic African American: No ?    Diabetic: Yes ?    Tobacco smoker: No ?    Systolic Blood Pressure: 161 mmHg ?    Is BP treated: Yes ?    HDL Cholesterol: 33 mg/dL ?    Total Cholesterol: 143 mg/dL   ? ?Other: (CHADS2VASc if Afib, PHQ9 if depression, MMRC or CAT for COPD, ACT, DEXA) ? ?Social History  ? ?Tobacco Use  ?Smoking Status Former  ? Types: Cigarettes  ? Quit date: 12/30/1989  ? Years since quitting: 31.7  ?Smokeless Tobacco Never  ? ?BP Readings from Last 3 Encounters:  ?08/14/21 114/68  ?07/10/21 122/81  ?04/14/21 128/80  ? ?Pulse Readings from Last 3 Encounters:  ?08/14/21 77  ?07/10/21 76  ?04/14/21 84  ? ?Wt Readings from Last 3 Encounters:  ?08/14/21 202 lb (91.6 kg)  ?07/10/21 197 lb 9.6 oz (89.6 kg)  ?04/14/21 190 lb (86.2 kg)  ? ? ?Assessment:  Review of patient past medical history, allergies, medications, health status, including review of consultants reports, laboratory and other test data, was performed as part of comprehensive evaluation and provision of chronic care management services.  ? ?SDOH:  (Social Determinants of Health) assessments and interventions performed: Yes ?SDOH Interventions   ? ?Flowsheet Row Most Recent Value  ?SDOH Interventions   ?Food Insecurity Interventions Intervention Not Indicated  ?Transportation Interventions Intervention Not Indicated  ? ?  ? ?SDOH Interventions   ? ?Flowsheet Row Most Recent Value  ?SDOH Interventions   ?Food Insecurity Interventions Intervention Not Indicated  ?Transportation Interventions Intervention Not Indicated  ? ?  ? ? ?Care Gaps: ?Pneumonia Vaccine:Last completed: Apr 21, 2005 ?OPHTHALMOLOGY EXAM:Last completed: Jun 03, 2020 ?  ?Star Rating Drugs: ?Sacubitril-valsartan 24-26 mg Last filled:05/05/21 30 DS ?Farxiga 10 mg Last filled:None noted ? ?CCM Care Plan ? ?Allergies  ?Allergen Reactions  ? Ace Inhibitors   ?  Muscle aches   ? Crestor [Rosuvastatin Calcium] Other (See Comments)  ?  Muscle aches  ? ? ?Medications Reviewed Today   ? ? Reviewed by Madelin Rear, [facility] (Pharmacist) on 09/21/21 at 1441  Med List Status: <None>  ? ?Medication Order Taking? Sig Documenting Provider Last Dose Status Informant  ?carvedilol (COREG) 12.5 MG tablet [id number] Yes Take 1 tablet (12.5 mg total) by mouth 2 (two) times daily. Venita Lick, NP  Active   ?Cholecalciferol (VITAMIN D3 SUPER STRENGTH) 50 MCG (2000 UT) CAPS [id number] Yes Take 2,000 Units by mouth daily. [provider]  Active   ?dapagliflozin propanediol (FARXIGA) 10 MG TABS tablet [id number] Yes Take 1 tablet (10 mg total) by mouth daily before breakfast. [name] T, NP  Active   ?dexlansoprazole (DEXILANT) 60 MG capsule [id number] Yes Take 1 capsule (60 mg total) by mouth daily. [name] T, NP  Active   ?febuxostat  (ULORIC) 40 MG tablet [id number] Yes Take 1 tablet (40 mg total) by mouth daily. [name] T, NP  Active   ?levothyroxine (SYNTHROID) 50 MCG tablet [id number] Yes Take 1 tablet (50 mcg total) by mouth daily. [name] T, NP  Active   ?sacubitril-valsartan (ENTRESTO) 24-26 MG [id number] Yes Take 1 tablet by mouth 2 (two) times  daily. Deboraha Sprang, MD  Active   ?sitaGLIPtin (JANUVIA) 50 MG tablet [id number] Yes Take 1 tablet (50 mg total) by mouth daily. Venita Lick, NP  Active   ?spironolactone (ALDACTONE) 25 MG tablet [id number] Yes Take 0.5 tablets (12.5 mg total) by mouth daily. Venita Lick, NP  Active   ? ?  ?  ? ?  ? ? ?Patient Active Problem List  ? Diagnosis Date Noted  ? CKD stage 3 due to type 2 diabetes mellitus ([place]) 08/30/2019  ? Type 2 diabetes mellitus with cardiac complication ([place]) 02/77/4128  ? Hypothyroidism 05/26/2019  ? Advanced care planning/counseling discussion 04/27/2017  ? Systolic CHF ([place]) 78/67/6720  ? BPH (benign prostatic hyperplasia) 04/14/2016  ? Hypertension associated with diabetes ([place]) 12/16/2014  ? Gout 12/16/2014  ? Hyperlipidemia associated with type 2 diabetes mellitus ([place]) 06/13/2012  ? Biventricular implantable cardioverter-defibrillator in situ 09/28/2010  ? Idiopathic cardiomyopathy ([place]) 01/27/2009  ? Myalgia due to statin 01/27/2009  ? ? ?Immunization History  ?Administered Date(s) Administered  ? Pneumococcal Polysaccharide-23 04/21/2005  ? Td 06/10/1996, 05/20/2010  ? Zoster, Live 11/04/2011  ? ? ?Conditions to be addressed/monitored: ?CHF, HTN, HLD, Hypertriglyceridemia, and DMII ? ?Care Plan : [place]  ?Updates made by Madelin Rear, [facility] since 09/21/2021 12:00 AM  ?  ? ?Problem: CHL AMB "PATIENT-SPECIFIC PROBLEM"   ?  ? ?Long-Range Goal: disease management   ?Start Date: 09/21/2021  ?This Visit's Progress: On track  ?Note:   ?Hyperlipidemia: (LDL goal < 70) ?-Uncontrolled ?High 10-yr ASCVD risk 30+%. DM, HTN.  ?Statin  intolerant-myalgia ?-Current treatment: ?No Rx therapy ?-Medications previously tried: rosuvastatin (myalgia)  ?-Current dietary patterns: see dm ?-Current exercise habits: see dm ?-Educated on Cholesterol goals;  ?Benefits

## 2021-09-21 NOTE — Patient Instructions (Signed)
Mr. Iyer, ? ?Thank you for talking with me today. I have included our care plan/goals in the following pages.  ? ?Please review and call me at [phone] with any questions. ? ?Thanks! ?Edison Nasuti  ? ?Madelin Rear, PharmD ?Clinical Pharmacist  ?[phone] ? ?Care Plan : [place]  ?Updates made by Madelin Rear, [facility] since 09/21/2021 12:00 AM  ?  ? ?Problem: CHL AMB "PATIENT-SPECIFIC PROBLEM"   ?  ? ?Long-Range Goal: disease management   ?Start Date: 09/21/2021  ?This Visit's Progress: On track  ?Note:   ?Hyperlipidemia: (LDL goal < 70) ?-Uncontrolled ?High 10-yr ASCVD risk 30+%. DM, HTN.  ?Statin intolerant-myalgia ?-Current treatment: ?No Rx therapy ?-Medications previously tried: rosuvastatin (myalgia)  ?-Current dietary patterns: see dm ?-Current exercise habits: see dm ?-Educated on Cholesterol goals;  ?Benefits of statin for ASCVD risk reduction; ?-Counseled on diet and exercise extensively ?Agreed to talk about non statin options for ASCVD risk reduction given DM, HTN, peristant TG elevations at next f/u, wanting DM focus before HLD adjustments. ?67mtel f/u  ? ?Diabetes (A1c goal <7%) ?-Controlled ?$160/3 months - ok with staying on this. Would be open to injectables.  ?Farxiga - through PAP. ?-Current medications: ?Farxiga 10 mg  ?Januvia 50 mg daily  ?-Medications previously tried: rybelsus - stomach issues/GERD  ?-Current home glucose readings ?fasting glucose: 170s-180s ?post prandial glucose:  ?-Denies hypoglycemic/hyperglycemic symptoms ?-Current meal patterns:  ?breakfast: 2 cups of coffee, two eggs (+/- bread), strip of bacon or sausage.   ?lunch: sandwich (whole wheat bread, cheese, spread)  ?dinner: salad, meat, vegetable (green beans, rice, onions) ?snacks:  ?drinks: no more than 1 beer/day (miller lite)  ?-Current exercise: active out doors w/ farm work, yard work. Not sedentary ?-Educated on A1c and blood sugar goals; ?Carbohydrate counting and/or plate method ?-Counseled to check feet  daily and get yearly eye exams ?-Recommended Januvia dose increase to 100 mg once daily  ? ?Medication Assistance: Application for Januvia 100 mg once daily  medication assistance program. in process.  Anticipated assistance start date 10/2021.  See plan of care for additional detail. ? ?  ? ? ?The patient verbalized understanding of instructions provided today and agreed to receive a MyChart copy of patient instruction and/or educational materials. ?Telephone follow up appointment with pharmacy team member scheduled for: See next appointment with "Care Management Staff" under "What's Next" below.    ? ? ?

## 2021-09-21 NOTE — Telephone Encounter (Signed)
-----   Message from Venita Lick, NP sent at 09/21/2021  3:35 PM EDT ----- ?Can send for 100 MG, recent GFR 61. ?----- Message ----- ?From: Madelin Rear, [facility] ?Sent: 09/21/2021   2:58 PM EDT ?To: Venita Lick, NP ? ?Ok w/ [place] sending out Januvia 100 mg PAP in advance of PCP visit 10/23/2021? ? ?He's currently on 50 mg daily and from what I understand you wanted repeat labs before increasing d/t dip in GFR. I can tell him to hold until next visit if he receives in advance. Just let me know thanks!  ? ?

## 2021-09-22 DIAGNOSIS — H43811 Vitreous degeneration, right eye: Secondary | ICD-10-CM | POA: Diagnosis not present

## 2021-09-22 DIAGNOSIS — H2513 Age-related nuclear cataract, bilateral: Secondary | ICD-10-CM | POA: Diagnosis not present

## 2021-09-22 DIAGNOSIS — E119 Type 2 diabetes mellitus without complications: Secondary | ICD-10-CM | POA: Diagnosis not present

## 2021-09-22 LAB — HM DIABETES EYE EXAM

## 2021-09-24 NOTE — Progress Notes (Signed)
I have called the patient about increasing his Januvia to 100 mg. Patient understood, patient did mention if he increases his Januvia with his current 50 mg dosage he will only have 7 days left worth of the medication. He asked if a new script could be sent in to his pharmacy since he will be running out soon? I have also prefilled the patient assistance application on Januvia 568 mg for the patient and will be mailing out the application directly to his home address for the patient to sign and date and to bring back to the office for PCP signature. Patient verbally understood everything. I have also given him my contact information in case he needs any assistance on anything.  Corrie Mckusick, [place]

## 2021-09-25 MED ORDER — SITAGLIPTIN PHOSPHATE 100 MG PO TABS: 100.0000 mg | ORAL_TABLET | Freq: Every day | ORAL | 4 refills | Status: DC

## 2021-09-25 NOTE — Addendum Note (Signed)
Addended by: [name] T on: 09/25/2021 02:50 PM   Modules accepted: Orders

## 2021-09-25 NOTE — Chronic Care Management (AMB) (Signed)
Patient might be running out of [place] soon as we pursue PAP. He wants Rx sent to pharmacy. Could you please send 100 mg? I would think it would be same price as 50 mg

## 2021-10-07 DIAGNOSIS — E785 Hyperlipidemia, unspecified: Secondary | ICD-10-CM | POA: Diagnosis not present

## 2021-10-07 DIAGNOSIS — Z7984 Long term (current) use of oral hypoglycemic drugs: Secondary | ICD-10-CM | POA: Diagnosis not present

## 2021-10-07 DIAGNOSIS — E1159 Type 2 diabetes mellitus with other circulatory complications: Secondary | ICD-10-CM | POA: Diagnosis not present

## 2021-10-07 DIAGNOSIS — E1169 Type 2 diabetes mellitus with other specified complication: Secondary | ICD-10-CM | POA: Diagnosis not present

## 2021-10-07 NOTE — Progress Notes (Signed)
Patient has been notified

## 2021-10-18 NOTE — Patient Instructions (Signed)

## 2021-10-23 ENCOUNTER — Ambulatory Visit (INDEPENDENT_AMBULATORY_CARE_PROVIDER_SITE_OTHER): Payer: Medicare Other | Admitting: Nurse Practitioner

## 2021-10-23 ENCOUNTER — Encounter: Payer: Self-pay | Admitting: Nurse Practitioner

## 2021-10-23 ENCOUNTER — Other Ambulatory Visit: Payer: Self-pay | Admitting: Nurse Practitioner

## 2021-10-23 VITALS — BP 118/74 | HR 70 | Temp 97.9°F | Ht 72.52 in | Wt 199.4 lb

## 2021-10-23 DIAGNOSIS — I5022 Chronic systolic (congestive) heart failure: Secondary | ICD-10-CM

## 2021-10-23 DIAGNOSIS — N183 Chronic kidney disease, stage 3 unspecified: Secondary | ICD-10-CM | POA: Diagnosis not present

## 2021-10-23 DIAGNOSIS — I429 Cardiomyopathy, unspecified: Secondary | ICD-10-CM

## 2021-10-23 DIAGNOSIS — Z95810 Presence of automatic (implantable) cardiac defibrillator: Secondary | ICD-10-CM

## 2021-10-23 DIAGNOSIS — T466X5A Adverse effect of antihyperlipidemic and antiarteriosclerotic drugs, initial encounter: Secondary | ICD-10-CM

## 2021-10-23 DIAGNOSIS — G720 Drug-induced myopathy: Secondary | ICD-10-CM

## 2021-10-23 DIAGNOSIS — I152 Hypertension secondary to endocrine disorders: Secondary | ICD-10-CM

## 2021-10-23 DIAGNOSIS — E1169 Type 2 diabetes mellitus with other specified complication: Secondary | ICD-10-CM

## 2021-10-23 DIAGNOSIS — E785 Hyperlipidemia, unspecified: Secondary | ICD-10-CM | POA: Diagnosis not present

## 2021-10-23 DIAGNOSIS — E1159 Type 2 diabetes mellitus with other circulatory complications: Secondary | ICD-10-CM | POA: Diagnosis not present

## 2021-10-23 DIAGNOSIS — E1122 Type 2 diabetes mellitus with diabetic chronic kidney disease: Secondary | ICD-10-CM | POA: Diagnosis not present

## 2021-10-23 DIAGNOSIS — E039 Hypothyroidism, unspecified: Secondary | ICD-10-CM

## 2021-10-23 LAB — BAYER DCA HB A1C WAIVED: HB A1C (BAYER DCA - WAIVED): 6.6 % — ABNORMAL HIGH (ref 4.8–5.6)

## 2021-10-23 NOTE — Assessment & Plan Note (Signed)
Due to statin therapy, history of on low doses.  Would benefit from statin use with diabetes and heart health or consider injectable, discussed with him today.  

## 2021-10-23 NOTE — Assessment & Plan Note (Signed)
Continue collaboration with cardiology and regular checks. 

## 2021-10-23 NOTE — Assessment & Plan Note (Signed)
Chronic, stable.  Euvolemic today.  Continue current medication regimen and collaboration with cardiology.  LABS: up to date.   Recommend: - Reminded to call for an overnight weight gain of >2 pounds or a weekly weight weight of >5 pounds - not adding salt to his food and has been reading food labels. Reviewed the importance of keeping daily sodium intake to <2000mg daily  - Avoid NSAIDS 

## 2021-10-23 NOTE — Assessment & Plan Note (Signed)
Chronic, ongoing with HF.  A1c last visit 7.5%, upward trend from 5.6% previous visit, urine ALB 10 in March 2023.  Will recheck today.  Rybelsus caused GI issues.  Continue current medication regimen with Farxiga and increase Januvia to 100 MG if not taking this dosage + continue collaboration with cardiology.  Could consider Sulfonylurea at low dose if needed, since he prefers no injectables.  Recommend he check BS at home at least a few mornings a week with goal fasting <130, bring to next visit.  Continue to collaborate with CCM crew.  Return to office in 3 months.

## 2021-10-23 NOTE — Progress Notes (Signed)
BP 118/74 (BP Location: Left Arm, Patient Position: Sitting, Cuff Size: Normal)   Pulse 70   Temp 97.9 F (36.6 C) (Oral)   Ht 6' 0.52" (1.842 m)   Wt 199 lb 6.4 oz (90.4 kg)   SpO2 98%   BMI 26.66 kg/m    Subjective:    Patient ID: Ryan [name], male    DOB: March 30, 1951, 71 y.o.   [name]: [id number]  HPI: Ryan [name] is a 71 y.o. male  Chief Complaint  Patient presents with   Diabetes   Hyperlipidemia   Hypertension   Thyroid Problem   DIABETES A1c March 7.5%, had birthday recently and had elevations.  Takes Wilder Glade (gets via assistance), had been on Rybelsus 7 MG but caused GI upset -- we started Januvia 50 MG (there is a 100 MG ordered in chart, but he reports taking 50 MG). Had intolerance to Metformin. Prefers no injectables. Hypoglycemic episodes:no Polydipsia/polyuria: no Visual disturbance: no Chest pain: no Paresthesias: no Glucose Monitoring: no             Accucheck frequency: once and awhile (every 3-4 days)             Fasting glucose: 200 this morning             Post prandial:             Evening:             Before meals: Taking Insulin?: no             Long acting insulin:             Short acting insulin: Blood Pressure Monitoring: daily Retinal Examination: Up to Date  Foot Exam: Up to Date Pneumovax: refuses Influenza:  refused Aspirin: no    HYPERTENSION / HYPERLIPIDEMIA/HF Is followed by cardiology, Dr. Caryl Comes, last saw him 04/14/21 and has cardio-defib implantable (replacement last done in January 2012) last check 07/10/21.  Continues on Aldactone, Entresto (gets via assistance), Coreg.  No statin, reports cardiology took him off of this.  Pravastatin in past with poor response -- myalgia.   Satisfied with current treatment? yes Duration of hypertension: chronic BP monitoring frequency: a few times a week BP range: 120-130/80 range BP medication side effects: no Duration of hyperlipidemia: chronic Medication compliance: good  compliance Aspirin: no Recent stressors: no Recurrent headaches: no Visual changes: no Palpitations: no Dyspnea: no Chest pain: no Lower extremity edema: no Dizzy/lightheaded: no  The 10-year ASCVD risk score (Arnett DK, et al., 2019) is: 34.8%   Values used to calculate the score:     Age: 71 years     Sex: Male     Is Non-Hispanic African American: No     Diabetic: Yes     Tobacco smoker: No     Systolic Blood Pressure: 621 mmHg     Is BP treated: Yes     HDL Cholesterol: 33 mg/dL     Total Cholesterol: 143 mg/dL   HYPOTHYROIDISM Continues on Levothyroxine 50 MCG daily. Thyroid control status:stable Satisfied with current treatment? yes Medication side effects: no Medication compliance: good compliance Etiology of hypothyroidism: unknown Recent dose adjustment:no Fatigue: no Cold intolerance: no Heat intolerance: no Weight gain: no Weight loss: no Constipation: no Diarrhea/loose stools: no Palpitations: no Lower extremity edema: no Anxiety/depressed mood: no   Relevant past medical, surgical, family and social history reviewed and updated as indicated. Interim medical history since our last visit reviewed. Allergies and medications  reviewed and updated.  Review of Systems  Constitutional:  Negative for activity change, diaphoresis, fatigue and fever.  Respiratory:  Negative for cough, chest tightness, shortness of breath and wheezing.   Cardiovascular:  Negative for chest pain, palpitations and leg swelling.  Gastrointestinal: Negative.   Endocrine: Negative for polydipsia, polyphagia and polyuria.  Neurological: Negative.   Psychiatric/Behavioral: Negative.      Per HPI unless specifically indicated above     Objective:    BP 118/74 (BP Location: Left Arm, Patient Position: Sitting, Cuff Size: Normal)   Pulse 70   Temp 97.9 F (36.6 C) (Oral)   Ht 6' 0.52" (1.842 m)   Wt 199 lb 6.4 oz (90.4 kg)   SpO2 98%   BMI 26.66 kg/m   Wt Readings from Last  3 Encounters:  10/23/21 199 lb 6.4 oz (90.4 kg)  08/14/21 202 lb (91.6 kg)  07/10/21 197 lb 9.6 oz (89.6 kg)    Physical Exam Vitals and nursing note reviewed.  Constitutional:      General: He is awake. He is not in acute distress.    Appearance: He is well-developed and well-groomed. He is not ill-appearing.  HENT:     Head: Normocephalic and atraumatic.     Right Ear: Hearing normal. No drainage.     Left Ear: Hearing normal. No drainage.  Eyes:     General: Lids are normal.        Right eye: No discharge.        Left eye: No discharge.     Conjunctiva/sclera: Conjunctivae normal.     Pupils: Pupils are equal, round, and reactive to light.  Neck:     Vascular: No carotid bruit.  Cardiovascular:     Rate and Rhythm: Normal rate and regular rhythm.     Heart sounds: Normal heart sounds, S1 normal and S2 normal. No murmur heard.    No gallop.  Pulmonary:     Effort: Pulmonary effort is normal. No accessory muscle usage or respiratory distress.     Breath sounds: Normal breath sounds.  Abdominal:     General: Bowel sounds are normal.     Palpations: Abdomen is soft.  Musculoskeletal:        General: Normal range of motion.     Cervical back: Normal range of motion and neck supple.     Right lower leg: No edema.     Left lower leg: No edema.  Skin:    General: Skin is warm and dry.     Capillary Refill: Capillary refill takes less than 2 seconds.  Neurological:     Mental Status: He is alert and oriented to person, place, and time.  Psychiatric:        Attention and Perception: Attention normal.        Mood and Affect: Mood normal.        Speech: Speech normal.        Behavior: Behavior normal. Behavior is cooperative.        Thought Content: Thought content normal.     Results for orders placed or performed in visit on 10/13/21  HM DIABETES EYE EXAM  Result Value Ref Range   HM Diabetic Eye Exam No Retinopathy No Retinopathy      Assessment & Plan:   Problem  List Items Addressed This Visit       Cardiovascular and Mediastinum   Hypertension associated with diabetes ([place])    Chronic, stable with BP at goal on  recheck today and on home readings.  Continue current medication regimen and collaboration with cardiology.  Recommend checking BP at home at least a few mornings a week + focus on DASH diet.  LABS: up to date.  Return in 3 months.      Relevant Orders   Bayer DCA Hb A1c Waived   Idiopathic cardiomyopathy ([place])    Ongoing, stable, followed by cardiology.  Continue this collaboration.      Systolic CHF (HCC)    Chronic, stable.  Euvolemic today.  Continue current medication regimen and collaboration with cardiology.  LABS: up to date.   Recommend: - Reminded to call for an overnight weight gain of >2 pounds or a weekly weight weight of >5 pounds - not adding salt to his food and has been reading food labels. Reviewed the importance of keeping daily sodium intake to '2000mg'$  daily  - Avoid NSAIDS      Type 2 diabetes mellitus with cardiac complication (HCC) - Primary    Chronic, ongoing with HF.  A1c last visit 7.5%, upward trend from 5.6% previous visit, urine ALB 10 in March 2023.  Will recheck today.  Rybelsus caused GI issues.  Continue current medication regimen with Farxiga and increase Januvia to 100 MG if not taking this dosage + continue collaboration with cardiology.  Could consider Sulfonylurea at low dose if needed, since he prefers no injectables.  Recommend he check BS at home at least a few mornings a week with goal fasting <130, bring to next visit.  Continue to collaborate with CCM crew.  Return to office in 3 months.      Relevant Orders   Bayer DCA Hb A1c Waived     Endocrine   CKD stage 3 due to type 2 diabetes mellitus ([place])    Ongoing, 3a, improved last visit.  Recheck at next visit. Urine ALB 17 July 2021 on check.  Consider referral to nephrology if worsening.      Relevant Orders   Bayer DCA Hb A1c Waived    Hyperlipidemia associated with type 2 diabetes mellitus (HCC)    Chronic, ongoing.  No current medication due to poor tolerance.   Would benefit from statin use with diabetes and heart health or consider injectable, discussed with him today.  Lipid panel up to date.  He is agreeable to trial of Rosuvastatin on 3 day or 1 day a week schedule if LDL >70 in future if needed.      Relevant Orders   Bayer DCA Hb A1c Waived   Hypothyroidism    Chronic, ongoing.  Continue current medication regimen and adjust as needed based on labs.  TSH + Free T4 up to date.        Musculoskeletal and Integument   Drug-induced myopathy    Due to statin therapy, history of on low doses.  Would benefit from statin use with diabetes and heart health or consider injectable, discussed with him today.         Other   Biventricular implantable cardioverter-defibrillator in situ    Continue collaboration with cardiology and regular checks.        Follow up plan: Return in about 3 months (around 01/23/2022) for T2DM, HTN/HLD, THYROID, GOUT.

## 2021-10-23 NOTE — Assessment & Plan Note (Signed)
Chronic, ongoing.  Continue current medication regimen and adjust as needed based on labs.  TSH + Free T4 up to date. 

## 2021-10-23 NOTE — Telephone Encounter (Signed)
Requested Prescriptions  Pending Prescriptions Disp Refills  . carvedilol (COREG) 12.5 MG tablet [Pharmacy Med Name: CARVEDILOL 12.5 MG TABLET] 180 tablet 0    Sig: Take 1 tablet (12.5 mg total) by mouth 2 (two) times daily.     Cardiovascular: Beta Blockers 3 Passed - 10/23/2021 12:06 PM      Passed - Cr in normal range and within 360 days    Creatinine, Ser  Date Value Ref Range Status  07/10/2021 1.27 0.76 - 1.27 mg/dL Final         Passed - AST in normal range and within 360 days    AST  Date Value Ref Range Status  07/10/2021 16 0 - 40 IU/L Final   AST (SGOT) Piccolo, Waived  Date Value Ref Range Status  01/10/2019 25 11 - 38 U/L Final         Passed - ALT in normal range and within 360 days    ALT  Date Value Ref Range Status  07/10/2021 30 0 - 44 IU/L Final   ALT (SGPT) Piccolo, Waived  Date Value Ref Range Status  01/10/2019 35 10 - 47 U/L Final         Passed - Last BP in normal range    BP Readings from Last 1 Encounters:  10/23/21 118/74         Passed - Last Heart Rate in normal range    Pulse Readings from Last 1 Encounters:  10/23/21 70         Passed - Valid encounter within last 6 months    Recent Outpatient Visits          Today Type 2 diabetes mellitus with cardiac complication ([place])   [place], [name] T, NP   2 months ago Type 2 diabetes mellitus with cardiac complication ([place])   [place], [name] T, NP   3 months ago Type 2 diabetes mellitus with cardiac complication ([place])   [place], [name] T, NP   6 months ago Type 2 diabetes mellitus with cardiac complication ([place])   [place], [name] T, NP   9 months ago Type 2 diabetes mellitus with cardiac complication ([place])   [place], Barbaraann Faster, NP      Future Appointments            In 3 months Cannady, Barbaraann Faster, NP [facility], [facility]   In 6 months Nehemiah Massed, Monia Sabal, MD  [place]

## 2021-10-23 NOTE — Assessment & Plan Note (Signed)
Ongoing, stable, followed by cardiology.  Continue this collaboration. 

## 2021-10-23 NOTE — Assessment & Plan Note (Signed)
Chronic, ongoing.  No current medication due to poor tolerance.   Would benefit from statin use with diabetes and heart health or consider injectable, discussed with him today.  Lipid panel up to date.  He is agreeable to trial of Rosuvastatin on 3 day or 1 day a week schedule if LDL >70 in future if needed. 

## 2021-10-23 NOTE — Assessment & Plan Note (Signed)
Ongoing, 3a, improved last visit.  Recheck at next visit. Urine ALB 17 July 2021 on check.  Consider referral to nephrology if worsening.

## 2021-10-23 NOTE — Assessment & Plan Note (Signed)
Chronic, stable with BP at goal on recheck today and on home readings.  Continue current medication regimen and collaboration with cardiology.  Recommend checking BP at home at least a few mornings a week + focus on DASH diet.  LABS: up to date.  Return in 3 months. 

## 2021-10-26 ENCOUNTER — Other Ambulatory Visit: Payer: Self-pay | Admitting: Nurse Practitioner

## 2021-10-26 DIAGNOSIS — I429 Cardiomyopathy, unspecified: Secondary | ICD-10-CM

## 2021-10-27 ENCOUNTER — Ambulatory Visit (INDEPENDENT_AMBULATORY_CARE_PROVIDER_SITE_OTHER): Payer: Medicare Other

## 2021-10-27 ENCOUNTER — Telehealth: Payer: Medicare Other

## 2021-10-27 ENCOUNTER — Other Ambulatory Visit: Payer: Self-pay | Admitting: Nurse Practitioner

## 2021-10-27 DIAGNOSIS — I429 Cardiomyopathy, unspecified: Secondary | ICD-10-CM

## 2021-10-27 DIAGNOSIS — E1159 Type 2 diabetes mellitus with other circulatory complications: Secondary | ICD-10-CM

## 2021-10-27 DIAGNOSIS — E1169 Type 2 diabetes mellitus with other specified complication: Secondary | ICD-10-CM

## 2021-10-27 DIAGNOSIS — I5022 Chronic systolic (congestive) heart failure: Secondary | ICD-10-CM

## 2021-10-27 NOTE — Patient Instructions (Signed)
Visit Information  Thank you for taking time to visit with me today. Please don't hesitate to contact me if I can be of assistance to you before our next scheduled telephone appointment.  Following are the goals we discussed today:  Heart Failure Interventions:  (Status: Goal Met.)  Long Term Goal 10-27-2021: The patient has met the goals of care.  Basic overview and discussion of pathophysiology of Heart Failure reviewed Provided education on low sodium diet. 10-27-2021: Review of a heart healthy/ADA diet. The patient states that breakfast is his biggest meal of the day and usually he eats a sandwich at lunch time and a meat with a vegetable at supper. He tries to monitor his intake well but admits sometimes he gets a pizza. Education and support given.  Reviewed Heart Failure Action Plan in depth and provided written copy. 06-16-2021: The patient states that he saw his cardiologist in December and his cardiologist stated he had 2 years and 2 months left on his device. The patient states that he has actually gained time on it and that makes him happy. He has a recorder at home that he uses. The patient denies any changes in his conditions and has been approved for assistance with getting his Delene Loll until the end of 2023. Education and support given. 10-27-2021: The patient is compliant with the plan of care for his heart failure. Sees specialist on a regular basis.  Assessed need for readable accurate scales in home Provided education about placing scale on hard, flat surface Advised patient to weigh each morning after emptying bladder Discussed importance of daily weight and advised patient to weigh and record daily Reviewed role of diuretics in prevention of fluid overload and management of heart failure Discussed the importance of keeping all appointments with provider.pcp appointment on 01-25-2022  Provided patient with education about the role of exercise in the management of heart failure Advised  patient to discuss acute changes of HF with provider Screening for signs and symptoms of depression related to chronic disease state  Assessed social determinant of health barriers EF% 40-45 %   Diabetes:  (Status: Goal Met.) Long Term Goal         Lab Results  Component Value Date    HGBA1C 6.6 (H) 10/23/2021  Previous: 5.6% on 04-10-2021 Assessed patient's understanding of A1c goal: <7%. 10-27-2021: Review of goal of A1C being <7.0%. The patient is currently at goal and praised for the patient at goal. Provided education to patient about basic DM disease process. 08-18-2021: the patient is aware of how to effectively manage DM. He has not been able to tolerate some of the medications for his DM. He is hopeful that his A1C in June will be under 7.0%. Education and support given. 10-27-2021: The patient has a good understanding of his DM and effective management. Review with the patient ; Reviewed medications with patient and discussed importance of medication adherence. 04-15-2021: The patient sent a my chart message to the pcp on 04-14-2021.  The patient has been having increased heartburn and GI symptoms for over a week and he feels like it is caused by the Rybelsus. The patient stopped taking and has not taken for 2 days. He feels much better and was able to sleep last night. Per pcp note monitoring will continue and a new A1C will be obtained at March visit. Reviewed with the patient to call the office for questions or concerns or consistently low blood sugars or high blood sugars. The patient has  a good understanding of effective management of his DM. Verbalized understanding. Wanted to be able to take the Rybelsus but feels this was causing him to have more issues. Emotional listening and support given. 06-16-2021: The patient states that he had to stop taking the Rybelsus because it caused him to have bad acid reflux. The patient says this was really helping him keep his A1C down. The patient states  that he really was hoping he could tolerate the medications but his symptoms just kept getting worse. He has discussed this with [name] and will follow up with her in March. Education and support given. 08-18-2021: The patient is no longer taking the Rybelsus but states he has started on the jardiance. The patient states he has gained weight since coming off of the Rybelsus because it curbed his appetite. He has gone from 189 to 29. He denies this being water weight. 10-27-2021: The patient is compliant with medications and has an increase of his Januvia to 100 mg QD and he feels this is helpful ;        Reviewed prescribed diet with patient heart healthy/ADA diet. 10-27-2021: Review and support given. Review for monitoring carbohydrates and concentrated sweets ; Counseled on importance of regular laboratory monitoring as prescribed. 08-18-2021: The patient will have a new A1C in June.  10-27-2021: A1C is 6.6%      Discussed plans with patient for ongoing care management follow up and provided patient with direct contact information for care management team;      Provided patient with written educational materials related to hypo and hyperglycemia and importance of correct treatment. 06-16-2021: The patient denies any lows at this time.   The patient states he has not been checking his blood sugar consistently. He did take it this am and it was 190. Review of sx and sx of hypoglycemia and hyperglycemia.  10-27-2021: Denies any lows or highs. Has not been checking on a consistent basis. Did take his reading on Saturday and Sunday.     Reviewed scheduled/upcoming provider appointments including: 01-25-2022 at 1020 am;         Advised patient, providing education and rationale, to check cbg when you have symptoms of low or high blood sugar, before and after exercise, and as directed by the pcp  and record . 04-15-2021: The patient states he is getting readings of 111 to 130, denies and extreme lows or highs. Will continue  to monitor.   06-16-2021: The patient is not consistently checking his blood sugars. He states he his blood sugar this am was 190. Education and support given. 08-18-2021: The patient has not been checking but after seeing the pcp on Friday he is committed to checking his blood sugars at least 3 times a week. Readings on 08-15-2021 was 170 and 08-16-2021 was 190. Review with the patient the goal of blood sugars <130 fasting and post prandial <180. The patient is thankful for the CCM team helping him with maintaining his health and well being. Will continue to monitor for changes and new needs.  10-27-2021: The patient has a blood sugars range of 135-170 range. Praised for A1C of 6.6 call provider for findings outside established parameters;       Review of patient status, including review of consultants reports, relevant laboratory and other test results, and medications completed;       Advised patient to discuss questions or concerns  with provider;      Last eye exam was January 2022. The  patient states he needs to call and schedule an appointment for an eye exam and will do this soon   Hyperlipidemia:  (Status: Goal on Track (progressing): YES.) Long Term Goal       Lab Results  Component Value Date    CHOL 143 07/10/2021    HDL 33 (L) 07/10/2021    LDLCALC 70 07/10/2021    TRIG 242 (H) 07/10/2021    CHOLHDL 4.0 05/24/2018      Medication review performed; medication list updated in electronic medical record. 08-18-2021: The patient is unable to tolerate statins. The patient is monitored on a consistent basis for effective management of HLD.  Provider established cholesterol goals review. 08-18-2021: Reviewed the goal of LDL being <70; Counseled on importance of regular laboratory monitoring as prescribed. 08-18-2021: The patient has consistent lab work, last in 07-10-2021 Provided HLD educational materials; Reviewed role and benefits of statin for ASCVD risk reduction; Discussed strategies to manage  statin-induced myalgias; Reviewed importance of limiting foods high in cholesterol. 08-18-2021: Review of Heart Healthy/ADA diet. The patient is mindful of his dietary intake and watching for foods that can elevated his HLD levels.  Reviewed exercise goals and target of 150 minutes per week;   Hypertension: (Status: Goal Met) Last practice recorded BP readings:     BP Readings from Last 3 Encounters:  08/14/21 114/68  07/10/21 122/81  04/14/21 128/80  Most recent eGFR/CrCl:       Lab Results  Component Value Date    EGFR 61 07/10/2021    No components found for: CRCL   Evaluation of current treatment plan related to hypertension self management and patient's adherence to plan as established by provider. 08-18-2021: Denies any issues with HTN or heart health. Sees cardiologist and pcp on a consistent basis. Will continue to monitor for changes in conditions.  Blood pressures are stable and the patient feels like he is doing well.  Provided education to patient re: stroke prevention, s/s of heart attack and stroke; Reviewed prescribed diet heart healthy/ADA diet  Reviewed medications with patient and discussed importance of compliance. 08-18-2021: The patient is compliant with medications. Denies any new concerns at this time related to medications management;  Discussed plans with patient for ongoing care management follow up and provided patient with direct contact information for care management team; Advised patient, providing education and rationale, to monitor blood pressure daily and record, calling PCP for findings outside established parameters;  Advised patient to discuss blood pressure trends or changes in heart health with provider; Provided education on prescribed diet heart healthy/ADA diet ;  Discussed complications of poorly controlled blood pressure such as heart disease, stroke, circulatory complications, vision complications, kidney impairment, sexual dysfunction;   No  further follow up required. The patient has met the goals of care. Care Plan being closed. The patient knows to call for changes.  Please call the care guide team at [phone] if you need to cancel or reschedule your appointment.   If you are experiencing a Mental Health or [place] or need someone to talk to, please call the Suicide and Crisis Lifeline: 988 call the [place] National Suicide Prevention Lifeline: 365-711-1616 or TTY: 910 175 7799 TTY (843) 215-8961) to talk to a trained counselor call 1-800-273-TALK (toll free, 24 hour hotline)   Patient verbalizes understanding of instructions and care plan provided today and agrees to view in [place]. Active MyChart status and patient understanding of how to access instructions and care plan via MyChart confirmed with patient.  Noreene Larsson RN, MSN, [facility] Mobile: [phone]

## 2021-10-27 NOTE — Telephone Encounter (Signed)
last RF 07/10/21 #90 4 RF   Requested Prescriptions  Refused Prescriptions Disp Refills  . dexlansoprazole (DEXILANT) 60 MG capsule [Pharmacy Med Name: DEXLANSOPRAZOLE DR 60 MG CAP] 90 capsule 0    Sig: Take 1 capsule (60 mg total) by mouth daily.     Gastroenterology: Proton Pump Inhibitors Passed - 10/26/2021  1:07 PM      Passed - Valid encounter within last 12 months    Recent Outpatient Visits          4 days ago Type 2 diabetes mellitus with cardiac complication ([place])   [place], [name] T, NP   2 months ago Type 2 diabetes mellitus with cardiac complication ([place])   [place], [name] T, NP   3 months ago Type 2 diabetes mellitus with cardiac complication ([place])   [place] Cannady, [name] T, NP   6 months ago Type 2 diabetes mellitus with cardiac complication ([place])   [place] Cannady, [name] T, NP   9 months ago Type 2 diabetes mellitus with cardiac complication ([place])   [place], [name] T, NP      Future Appointments            In 3 months Cannady, Barbaraann Faster, NP [facility], [facility]   In 6 months Nehemiah Massed, Monia Sabal, MD [place]

## 2021-10-27 NOTE — Chronic Care Management (AMB) (Signed)
Chronic Care Management   CCM RN Visit Note  10/27/2021 Name: Ryan [name] [name]: [id number] DOB: 1950-06-30  Subjective: Ryan [name] is a 71 y.o. year old male who is a primary care patient of Cannady, Barbaraann Faster, NP. The care management team was consulted for assistance with disease management and care coordination needs.    Engaged with patient by telephone for follow up visit in response to provider referral for case management and/or care coordination services.   Consent to Services:  The patient was given information about Chronic Care Management services, agreed to services, and gave verbal consent prior to initiation of services.  Please see initial visit note for detailed documentation.   Patient agreed to services and verbal consent obtained.   Assessment: Review of patient past medical history, allergies, medications, health status, including review of consultants reports, laboratory and other test data, was performed as part of comprehensive evaluation and provision of chronic care management services.   SDOH (Social Determinants of Health) assessments and interventions performed:    CCM Care Plan  Allergies  Allergen Reactions   Ace Inhibitors     Muscle aches    Crestor [Rosuvastatin Calcium] Other (See Comments)    Muscle aches    Outpatient Encounter Medications as of 10/27/2021  Medication Sig   carvedilol (COREG) 12.5 MG tablet Take 1 tablet (12.5 mg total) by mouth 2 (two) times daily.   Cholecalciferol (VITAMIN D3 SUPER STRENGTH) 50 MCG (2000 UT) CAPS Take 2,000 Units by mouth daily.   dapagliflozin propanediol (FARXIGA) 10 MG TABS tablet Take 1 tablet (10 mg total) by mouth daily before breakfast.   dexlansoprazole (DEXILANT) 60 MG capsule Take 1 capsule (60 mg total) by mouth daily.   febuxostat (ULORIC) 40 MG tablet Take 1 tablet (40 mg total) by mouth daily.   levothyroxine (SYNTHROID) 50 MCG tablet Take 1 tablet (50 mcg total) by mouth daily.    sacubitril-valsartan (ENTRESTO) 24-26 MG Take 1 tablet by mouth 2 (two) times daily.   sitaGLIPtin (JANUVIA) 100 MG tablet Take 1 tablet (100 mg total) by mouth daily.   spironolactone (ALDACTONE) 25 MG tablet Take 0.5 tablets (12.5 mg total) by mouth daily.   No facility-administered encounter medications on file as of 10/27/2021.    Patient Active Problem List   Diagnosis Date Noted   CKD stage 3 due to type 2 diabetes mellitus ([place]) 08/30/2019   Type 2 diabetes mellitus with cardiac complication ([place]) 39/76/7341   Hypothyroidism 05/26/2019   Advanced care planning/counseling discussion 93/79/0240   Systolic CHF ([place]) 97/35/3299   BPH (benign prostatic hyperplasia) 04/14/2016   Hypertension associated with diabetes ([place]) 12/16/2014   Gout 12/16/2014   Hyperlipidemia associated with type 2 diabetes mellitus ([place]) 06/13/2012   Biventricular implantable cardioverter-defibrillator in situ 09/28/2010   Idiopathic cardiomyopathy ([place]) 01/27/2009   Drug-induced myopathy 01/27/2009    Conditions to be addressed/monitored:CHF, HTN, HLD, and DMII  Care Plan : RNCM: General Plan of Care (Adult) for Chronic Disease Management and Care Coordination Needs  Updates made by Vanita Ingles, RN since 10/27/2021 12:00 AM  Completed 10/27/2021   Problem: RNCM: Development of Plan of Care for Chronic Disease Management (CHF, HTN, HLD, DM) Resolved 10/27/2021  Priority: High  Note:   Goals of care have been met. The patients care plan for [facility] has been closed. The patient knows to call for changes or new needs. 10-27-2021: Closing the goal and care plan    Long-Range Goal: RNCM: Effective Management  of Plan of Care for Chronic Disease Management (CHF, HTN, HLD, DM) Completed 10/27/2021  Start Date: 04/15/2021  Expected End Date: 04/15/2022  Priority: High  Note:   Current Barriers: 10-27-2021: Goals of Care have been met and the Care Plan is being closed. Knowledge Deficits related to plan of care for  management of CHF, HTN, HLD, and DMII  Chronic Disease Management support and education needs related to CHF, HTN, HLD, and DMII  RNCM Clinical Goal(s):  Patient will verbalize basic understanding of CHF, HTN, HLD, and DMII disease process and self health management plan as evidenced by keeping appointments, following plan of care, calling the office for questions or concerns, and working with the pcp and CCM team to optimize the plan of care and effectively manage health and well being  take all medications exactly as prescribed and will call provider for medication related questions as evidenced by compliance with medications and calling for refills before running out of medications     attend all scheduled medical appointments: 01-25-2022 at 1020 am with pcp as evidenced by keeping appointments and calling for schedule change needs         demonstrate improved and ongoing adherence to prescribed treatment plan for CHF, HTN, HLD, and DMII as evidenced by stable conditions, seeing specialist as directed, and calling for changes in condition or questions  demonstrate ongoing self health care management ability for effective management of chronic conditions  as evidenced by working with the CCM team through collaboration with Consulting civil engineer, provider, and care team.   Interventions: 1:1 collaboration with primary care provider regarding development and update of comprehensive plan of care as evidenced by provider attestation and co-signature Inter-disciplinary care team collaboration (see longitudinal plan of care) Evaluation of current treatment plan related to  self management and patient's adherence to plan as established by provider   Heart Failure Interventions:  (Status: Goal Met.)  Long Term Goal 10-27-2021: The patient has met the goals of care.  Basic overview and discussion of pathophysiology of Heart Failure reviewed Provided education on low sodium diet. 10-27-2021: Review of a heart  healthy/ADA diet. The patient states that breakfast is his biggest meal of the day and usually he eats a sandwich at lunch time and a meat with a vegetable at supper. He tries to monitor his intake well but admits sometimes he gets a pizza. Education and support given.  Reviewed Heart Failure Action Plan in depth and provided written copy. 06-16-2021: The patient states that he saw his cardiologist in December and his cardiologist stated he had 2 years and 2 months left on his device. The patient states that he has actually gained time on it and that makes him happy. He has a recorder at home that he uses. The patient denies any changes in his conditions and has been approved for assistance with getting his Delene Loll until the end of 2023. Education and support given. 10-27-2021: The patient is compliant with the plan of care for his heart failure. Sees specialist on a regular basis.  Assessed need for readable accurate scales in home Provided education about placing scale on hard, flat surface Advised patient to weigh each morning after emptying bladder Discussed importance of daily weight and advised patient to weigh and record daily Reviewed role of diuretics in prevention of fluid overload and management of heart failure Discussed the importance of keeping all appointments with provider.pcp appointment on 01-25-2022  Provided patient with education about the role of exercise in  the management of heart failure Advised patient to discuss acute changes of HF with provider Screening for signs and symptoms of depression related to chronic disease state  Assessed social determinant of health barriers EF% 40-45 %  Diabetes:  (Status: Goal Met.) Long Term Goal   Lab Results  Component Value Date   HGBA1C 6.6 (H) 10/23/2021  Previous: 5.6% on 04-10-2021 Assessed patient's understanding of A1c goal: <7%. 10-27-2021: Review of goal of A1C being <7.0%. The patient is currently at goal and praised for the  patient at goal. Provided education to patient about basic DM disease process. 08-18-2021: the patient is aware of how to effectively manage DM. He has not been able to tolerate some of the medications for his DM. He is hopeful that his A1C in June will be under 7.0%. Education and support given. 10-27-2021: The patient has a good understanding of his DM and effective management. Review with the patient ; Reviewed medications with patient and discussed importance of medication adherence. 04-15-2021: The patient sent a my chart message to the pcp on 04-14-2021.  The patient has been having increased heartburn and GI symptoms for over a week and he feels like it is caused by the Rybelsus. The patient stopped taking and has not taken for 2 days. He feels much better and was able to sleep last night. Per pcp note monitoring will continue and a new A1C will be obtained at March visit. Reviewed with the patient to call the office for questions or concerns or consistently low blood sugars or high blood sugars. The patient has a good understanding of effective management of his DM. Verbalized understanding. Wanted to be able to take the Rybelsus but feels this was causing him to have more issues. Emotional listening and support given. 06-16-2021: The patient states that he had to stop taking the Rybelsus because it caused him to have bad acid reflux. The patient says this was really helping him keep his A1C down. The patient states that he really was hoping he could tolerate the medications but his symptoms just kept getting worse. He has discussed this with [name] and will follow up with her in March. Education and support given. 08-18-2021: The patient is no longer taking the Rybelsus but states he has started on the jardiance. The patient states he has gained weight since coming off of the Rybelsus because it curbed his appetite. He has gone from 189 to 76. He denies this being water weight. 10-27-2021: The patient is  compliant with medications and has an increase of his Januvia to 100 mg QD and he feels this is helpful ;        Reviewed prescribed diet with patient heart healthy/ADA diet. 10-27-2021: Review and support given. Review for monitoring carbohydrates and concentrated sweets ; Counseled on importance of regular laboratory monitoring as prescribed. 08-18-2021: The patient will have a new A1C in June.  10-27-2021: A1C is 6.6%      Discussed plans with patient for ongoing care management follow up and provided patient with direct contact information for care management team;      Provided patient with written educational materials related to hypo and hyperglycemia and importance of correct treatment. 06-16-2021: The patient denies any lows at this time.   The patient states he has not been checking his blood sugar consistently. He did take it this am and it was 190. Review of sx and sx of hypoglycemia and hyperglycemia.  10-27-2021: Denies any lows or highs. Has  not been checking on a consistent basis. Did take his reading on Saturday and Sunday.     Reviewed scheduled/upcoming provider appointments including: 01-25-2022 at 1020 am;         Advised patient, providing education and rationale, to check cbg when you have symptoms of low or high blood sugar, before and after exercise, and as directed by the pcp  and record . 04-15-2021: The patient states he is getting readings of 111 to 130, denies and extreme lows or highs. Will continue to monitor.   06-16-2021: The patient is not consistently checking his blood sugars. He states he his blood sugar this am was 190. Education and support given. 08-18-2021: The patient has not been checking but after seeing the pcp on Friday he is committed to checking his blood sugars at least 3 times a week. Readings on 08-15-2021 was 170 and 08-16-2021 was 190. Review with the patient the goal of blood sugars <130 fasting and post prandial <180. The patient is thankful for the CCM team helping him  with maintaining his health and well being. Will continue to monitor for changes and new needs.  10-27-2021: The patient has a blood sugars range of 135-170 range. Praised for A1C of 6.6 call provider for findings outside established parameters;       Review of patient status, including review of consultants reports, relevant laboratory and other test results, and medications completed;       Advised patient to discuss questions or concerns  with provider;      Last eye exam was January 2022. The patient states he needs to call and schedule an appointment for an eye exam and will do this soon  Hyperlipidemia:  (Status: Goal on Track (progressing): YES.) Long Term Goal  Lab Results  Component Value Date   CHOL 143 07/10/2021   HDL 33 (L) 07/10/2021   LDLCALC 70 07/10/2021   TRIG 242 (H) 07/10/2021   CHOLHDL 4.0 05/24/2018     Medication review performed; medication list updated in electronic medical record. 08-18-2021: The patient is unable to tolerate statins. The patient is monitored on a consistent basis for effective management of HLD.  Provider established cholesterol goals review. 08-18-2021: Reviewed the goal of LDL being <70; Counseled on importance of regular laboratory monitoring as prescribed. 08-18-2021: The patient has consistent lab work, last in 07-10-2021 Provided HLD educational materials; Reviewed role and benefits of statin for ASCVD risk reduction; Discussed strategies to manage statin-induced myalgias; Reviewed importance of limiting foods high in cholesterol. 08-18-2021: Review of Heart Healthy/ADA diet. The patient is mindful of his dietary intake and watching for foods that can elevated his HLD levels.  Reviewed exercise goals and target of 150 minutes per week;  Hypertension: (Status: Goal Met) Last practice recorded BP readings:  BP Readings from Last 3 Encounters:  08/14/21 114/68  07/10/21 122/81  04/14/21 128/80  Most recent eGFR/CrCl:  Lab Results  Component  Value Date   EGFR 61 07/10/2021    No components found for: CRCL  Evaluation of current treatment plan related to hypertension self management and patient's adherence to plan as established by provider. 08-18-2021: Denies any issues with HTN or heart health. Sees cardiologist and pcp on a consistent basis. Will continue to monitor for changes in conditions.  Blood pressures are stable and the patient feels like he is doing well.  Provided education to patient re: stroke prevention, s/s of heart attack and stroke; Reviewed prescribed diet heart healthy/ADA diet  Reviewed  medications with patient and discussed importance of compliance. 08-18-2021: The patient is compliant with medications. Denies any new concerns at this time related to medications management;  Discussed plans with patient for ongoing care management follow up and provided patient with direct contact information for care management team; Advised patient, providing education and rationale, to monitor blood pressure daily and record, calling PCP for findings outside established parameters;  Advised patient to discuss blood pressure trends or changes in heart health with provider; Provided education on prescribed diet heart healthy/ADA diet ;  Discussed complications of poorly controlled blood pressure such as heart disease, stroke, circulatory complications, vision complications, kidney impairment, sexual dysfunction;   Patient Goals/Self-Care Activities: Take medications as prescribed   Attend all scheduled provider appointments Call pharmacy for medication refills 3-7 days in advance of running out of medications Attend church or other social activities Perform all self care activities independently  Perform IADL's (shopping, preparing meals, housekeeping, managing finances) independently Call provider office for new concerns or questions  Work with the social worker to address care coordination needs and will continue to work with  the clinical team to address health care and disease management related needs call the Suicide and Crisis Lifeline: 988 call the [place] National Suicide Prevention Lifeline: 8044851057 or TTY: 936-340-3396 TTY 505 278 2575) to talk to a trained counselor call 1-800-273-TALK (toll free, 24 hour hotline) if experiencing a Mental Health or [place]  call office if I gain more than 2 pounds in one day or 5 pounds in one week keep legs up while sitting track weight in diary use salt in moderation watch for swelling in feet, ankles and legs every day weigh myself daily develop a rescue plan follow rescue plan if symptoms flare-up eat more whole grains, fruits and vegetables, lean meats and healthy fats know when to call the doctorfor changes in sx and sx of CHF to prevent exacerbation track symptoms and what helps feel better or worse dress right for the weather, hot or cold schedule appointment with eye doctor- Last eye exam January 2022 will call for appointment in new year. 06-16-2021- reminder to call for an appointment given today.  check blood sugar at prescribed times: when you have symptoms of low or high blood sugar, before and after exercise, and as directed   check feet daily for cuts, sores or redness enter blood sugar readings and medication or insulin into daily log take the blood sugar log to all doctor visits trim toenails straight across drink 6 to 8 glasses of water each day eat fish at least once per week fill half of plate with vegetables limit fast food meals to no more than 1 per week manage portion size prepare main meal at home 3 to 5 days each week read food labels for fat, fiber, carbohydrates and portion size reduce red meat to 2 to 3 times a week set a realistic goal keep feet up while sitting wash and dry feet carefully every day wear comfortable, cotton socks wear comfortable, well-fitting shoes check blood pressure 3 times per week choose  a place to take my blood pressure (home, clinic or office, retail store) write blood pressure results in a log or diary learn about high blood pressure keep a blood pressure log take blood pressure log to all doctor appointments call doctor for signs and symptoms of high blood pressure develop an action plan for high blood pressure keep all doctor appointments take medications for blood pressure exactly as prescribed report  new symptoms to your doctor eat more whole grains, fruits and vegetables, lean meats and healthy fats - call for medicine refill 2 or 3 days before it runs out - take all medications exactly as prescribed - call doctor with any symptoms you believe are related to your medicine - call doctor when you experience any new symptoms - go to all doctor appointments as scheduled - adhere to prescribed diet: heart healthy/ADA diet        Plan:No further follow up required: the patient has met the goals of care and the care plan has been completed. The patient knows to call for changes or new needs.  Noreene Larsson RN, MSN, [facility] Mobile: [phone]

## 2021-10-28 ENCOUNTER — Ambulatory Visit (INDEPENDENT_AMBULATORY_CARE_PROVIDER_SITE_OTHER): Payer: Medicare Other

## 2021-10-28 DIAGNOSIS — I428 Other cardiomyopathies: Secondary | ICD-10-CM

## 2021-10-28 NOTE — Telephone Encounter (Signed)
Notified pt rx refused due to several refills remaining. Pt verbalized understanding.

## 2021-10-28 NOTE — Telephone Encounter (Signed)
Pt knows he has 3 refills remaining. Requested Prescriptions  Pending Prescriptions Disp Refills  . dexlansoprazole (DEXILANT) 60 MG capsule [Pharmacy Med Name: DEXLANSOPRAZOLE DR 60 MG CAP] 90 capsule 0    Sig: Take 1 capsule (60 mg total) by mouth daily.     Gastroenterology: Proton Pump Inhibitors Passed - 10/27/2021  1:50 PM      Passed - Valid encounter within last 12 months    Recent Outpatient Visits          5 days ago Type 2 diabetes mellitus with cardiac complication ([place])   [employer], [name] T, NP   2 months ago Type 2 diabetes mellitus with cardiac complication ([place])   [place], [name] T, NP   3 months ago Type 2 diabetes mellitus with cardiac complication ([place])   [place] Cannady, [name] T, NP   6 months ago Type 2 diabetes mellitus with cardiac complication ([place])   [place] Cannady, [name] T, NP   9 months ago Type 2 diabetes mellitus with cardiac complication ([place])   [place], [name] T, NP      Future Appointments            In 2 months Cannady, Barbaraann Faster, NP [employer]   In 6 months Nehemiah Massed, Monia Sabal, MD [place]

## 2021-10-30 LAB — CUP PACEART REMOTE DEVICE CHECK
Battery Remaining Longevity: 24 mo
Battery Voltage: 2.93 V
Brady Statistic AP VP Percent: 2.25 %
Brady Statistic AP VS Percent: 0.01 %
Brady Statistic AS VP Percent: 97.74 %
Brady Statistic AS VS Percent: 0 %
Brady Statistic RA Percent Paced: 2.26 %
Brady Statistic RV Percent Paced: 99.98 %
Date Time Interrogation Session: 20230621031807
HighPow Impedance: 50 Ohm
HighPow Impedance: 63 Ohm
Implantable Lead Implant Date: 20060505
Implantable Lead Implant Date: 20120111
Implantable Lead Implant Date: 20180314
Implantable Lead Location: 753858
Implantable Lead Location: 753859
Implantable Lead Location: 753860
Implantable Lead Model: 5076
Implantable Lead Model: 6947
Implantable Pulse Generator Implant Date: 20180314
Lead Channel Impedance Value: 222.34 Ohm
Lead Channel Impedance Value: 222.34 Ohm
Lead Channel Impedance Value: 237.5 Ohm
Lead Channel Impedance Value: 237.5 Ohm
Lead Channel Impedance Value: 237.5 Ohm
Lead Channel Impedance Value: 418 Ohm
Lead Channel Impedance Value: 456 Ohm
Lead Channel Impedance Value: 475 Ohm
Lead Channel Impedance Value: 475 Ohm
Lead Channel Impedance Value: 475 Ohm
Lead Channel Impedance Value: 513 Ohm
Lead Channel Impedance Value: 589 Ohm
Lead Channel Impedance Value: 703 Ohm
Lead Channel Impedance Value: 760 Ohm
Lead Channel Impedance Value: 760 Ohm
Lead Channel Impedance Value: 760 Ohm
Lead Channel Impedance Value: 836 Ohm
Lead Channel Impedance Value: 836 Ohm
Lead Channel Pacing Threshold Amplitude: 0.625 V
Lead Channel Pacing Threshold Amplitude: 0.75 V
Lead Channel Pacing Threshold Amplitude: 1.125 V
Lead Channel Pacing Threshold Pulse Width: 0.4 ms
Lead Channel Pacing Threshold Pulse Width: 0.4 ms
Lead Channel Pacing Threshold Pulse Width: 0.4 ms
Lead Channel Sensing Intrinsic Amplitude: 1.375 mV
Lead Channel Sensing Intrinsic Amplitude: 1.375 mV
Lead Channel Setting Pacing Amplitude: 1.5 V
Lead Channel Setting Pacing Amplitude: 1.5 V
Lead Channel Setting Pacing Amplitude: 2 V
Lead Channel Setting Pacing Pulse Width: 0.4 ms
Lead Channel Setting Pacing Pulse Width: 0.4 ms
Lead Channel Setting Sensing Sensitivity: 0.3 mV

## 2021-11-06 DIAGNOSIS — E1159 Type 2 diabetes mellitus with other circulatory complications: Secondary | ICD-10-CM

## 2021-11-06 DIAGNOSIS — I509 Heart failure, unspecified: Secondary | ICD-10-CM | POA: Diagnosis not present

## 2021-11-06 DIAGNOSIS — I110 Hypertensive heart disease with heart failure: Secondary | ICD-10-CM

## 2021-11-06 DIAGNOSIS — E785 Hyperlipidemia, unspecified: Secondary | ICD-10-CM

## 2021-11-06 DIAGNOSIS — Z7984 Long term (current) use of oral hypoglycemic drugs: Secondary | ICD-10-CM | POA: Diagnosis not present

## 2021-11-11 NOTE — Progress Notes (Signed)
Remote ICD transmission.   

## 2021-11-18 ENCOUNTER — Telehealth: Payer: Self-pay | Admitting: Nurse Practitioner

## 2021-11-18 ENCOUNTER — Telehealth: Payer: Self-pay

## 2021-11-18 NOTE — Telephone Encounter (Signed)
Unable to select requested pharmacy in [place], routing request to office for refill.

## 2021-11-18 NOTE — Telephone Encounter (Signed)
Entered in error

## 2021-11-18 NOTE — Telephone Encounter (Signed)
Copied from [place] [id number]. Topic: General - Other >> Nov 18, 2021  9:33 AM Leitha Schuller wrote: Reason for CRM: patient is trying to get in contact w/ Madelin Rear, [facility] Pharmacist   Unable to find number, please assist further >> Nov 18, 2021  9:41 AM Leitha Schuller wrote: patient trying to get in contact w/ Dellie Catholic, phone was disconnected when transferring, called patient back, line was busy  If patient cb please transfer patinet to following number [phone]

## 2021-11-18 NOTE — Telephone Encounter (Signed)
Medication Refill - Medication: dapagliflozin propanediol (FARXIGA) 10 MG TABS tablet   Has the patient contacted their pharmacy? yes (Agent: If no, request that the patient contact the pharmacy for the refill. If patient does not wish to contact the pharmacy document the reason why and proceed with request.) (Agent: If yes, when and what did the pharmacy advise?)contact pcp  Preferred Pharmacy (with phone number or street name):[facility]  [id number] [place] [address]  [phone]- fx # [phone]  Has the patient been seen for an appointment in the last year OR does the patient have an upcoming appointment? yes  Agent: Please be advised that RX refills may take up to 3 business days. We ask that you follow-up with your pharmacy.

## 2021-11-25 ENCOUNTER — Other Ambulatory Visit: Payer: Self-pay | Admitting: Nurse Practitioner

## 2021-11-26 NOTE — Telephone Encounter (Signed)
Requested Prescriptions  Pending Prescriptions Disp Refills  . FARXIGA 10 MG TABS tablet [Pharmacy Med Name: FARXIGA 10 MG TABLET] 90 tablet 1    Sig: Take 10 mg by mouth daily.     Endocrinology:  Diabetes - SGLT2 Inhibitors Passed - 11/25/2021  9:01 AM      Passed - Cr in normal range and within 360 days    Creatinine, Ser  Date Value Ref Range Status  07/10/2021 1.27 0.76 - 1.27 mg/dL Final         Passed - HBA1C is between 0 and 7.9 and within 180 days    Hemoglobin A1C  Date Value Ref Range Status  11/08/2017 6.9  Final  11/08/2017 6.9  Final   HB A1C (BAYER DCA - WAIVED)  Date Value Ref Range Status  10/23/2021 6.6 (H) 4.8 - 5.6 % Final    Comment:             Prediabetes: 5.7 - 6.4          Diabetes: >6.4          Glycemic control for adults with diabetes: <7.0          Passed - eGFR in normal range and within 360 days    GFR calc Af Amer  Date Value Ref Range Status  06/04/2020 59 (L) >59 mL/min/1.73 Final    Comment:    **In accordance with recommendations from the NKF-ASN Task force,**   [facility] is in the process of updating its eGFR calculation to the   2021 CKD-EPI creatinine equation that estimates kidney function   without a race variable.    GFR calc non Af Amer  Date Value Ref Range Status  06/04/2020 51 (L) >59 mL/min/1.73 Final   eGFR  Date Value Ref Range Status  07/10/2021 61 >59 mL/min/1.73 Final         Passed - Valid encounter within last 6 months    Recent Outpatient Visits          1 month ago Type 2 diabetes mellitus with cardiac complication ([place])   [place] Cannady, [name] T, NP   3 months ago Type 2 diabetes mellitus with cardiac complication ([place])   [place] Cannady, [name] T, NP   4 months ago Type 2 diabetes mellitus with cardiac complication ([place])   [place] Cannady, [name] T, NP   7 months ago Type 2 diabetes mellitus with cardiac complication ([place])   [place]  Cannady, [name] T, NP   10 months ago Type 2 diabetes mellitus with cardiac complication ([place])   [address] [address] Venita Lick, NP      Future Appointments            In 2 months Cannady, Barbaraann Faster, NP [employer], [place]   In 5 months Nehemiah Massed, Monia Sabal, MD [place]

## 2021-11-27 ENCOUNTER — Telehealth: Payer: Self-pay

## 2021-11-27 NOTE — Telephone Encounter (Signed)
Will have CCM team check on patient

## 2021-12-01 ENCOUNTER — Telehealth: Payer: Self-pay

## 2021-12-01 ENCOUNTER — Other Ambulatory Visit: Payer: Self-pay | Admitting: Nurse Practitioner

## 2021-12-01 MED ORDER — DAPAGLIFLOZIN PROPANEDIOL 10 MG PO TABS: ORAL_TABLET | ORAL | 4 refills | Status: DC

## 2021-12-01 NOTE — Telephone Encounter (Signed)
-----   Message from Venita Lick, NP sent at 12/01/2021  4:02 PM EDT ----- Regarding: RE: Mediation refill Have printed, Robinn Overholt can you fax to number below:) ----- Message ----- From: Corrie Mckusick Sent: 12/01/2021   3:36 PM EDT To: Venita Lick, NP Subject: Mediation refill                               Hello!   Patient called stating he is out of his Farxiga 10 mg and will need to new prescription faxed to the patient assistance program [place] you mind faxing a new script for him so he can get refills he is out of his medication. Fax number is  [phone]. Thank you!

## 2021-12-01 NOTE — Telephone Encounter (Signed)
Prescription was printed and faxed over to [facility] to the fax information provided.

## 2021-12-01 NOTE — Progress Notes (Signed)
I have reached out to the patient directly patient states he was requesting a refill from [facility] and they stated he needed a new prescription faxed to them. He states he spoke to someone at "[facility]" and said that they would reach out to [facility] about this but he has not heard anything since then. I checked to see who he spoke with and he does not remember. I have sent a direct message to [name] T. Cannady about this request and given the fax number to the patient assistance program [place] I made the message urgent since the patient is out of his medication.  Corrie Mckusick, [place]

## 2021-12-28 ENCOUNTER — Telehealth: Payer: Medicare Other

## 2022-01-04 ENCOUNTER — Other Ambulatory Visit: Payer: Self-pay

## 2022-01-04 MED ORDER — ENTRESTO 24-26 MG PO TABS: 1.0000 | ORAL_TABLET | Freq: Two times a day (BID) | ORAL | 0 refills | Status: DC

## 2022-01-04 NOTE — Telephone Encounter (Signed)
This is a [place] pt 

## 2022-01-07 ENCOUNTER — Other Ambulatory Visit: Payer: Self-pay

## 2022-01-07 MED ORDER — ENTRESTO 24-26 MG PO TABS: 1.0000 | ORAL_TABLET | Freq: Two times a day (BID) | ORAL | 0 refills | Status: DC

## 2022-01-14 ENCOUNTER — Telehealth: Payer: Self-pay

## 2022-01-14 NOTE — Progress Notes (Signed)
Chronic Care Management Pharmacy Assistant   Name: Ryan [name]  [name]: [id number] DOB: January 14, 1951   Reason for Encounter: Disease State-General    Recent office visits:  None since last coordination call on 10/27/21 with CCM RN  Recent consult visits:  None since last coordination call on 10/27/21 with [facility] visits:  None since last coordination call on 10/27/21 with CCM RN  Medications: Outpatient Encounter Medications as of 01/14/2022  Medication Sig   carvedilol (COREG) 12.5 MG tablet Take 1 tablet (12.5 mg total) by mouth 2 (two) times daily.   Cholecalciferol (VITAMIN D3 SUPER STRENGTH) 50 MCG (2000 UT) CAPS Take 2,000 Units by mouth daily.   dapagliflozin propanediol (FARXIGA) 10 MG TABS tablet Take 10 mg by mouth daily.   dexlansoprazole (DEXILANT) 60 MG capsule Take 1 capsule (60 mg total) by mouth daily.   febuxostat (ULORIC) 40 MG tablet Take 1 tablet (40 mg total) by mouth daily.   levothyroxine (SYNTHROID) 50 MCG tablet Take 1 tablet (50 mcg total) by mouth daily.   sacubitril-valsartan (ENTRESTO) 24-26 MG Take 1 tablet by mouth 2 (two) times daily.   sitaGLIPtin (JANUVIA) 100 MG tablet Take 1 tablet (100 mg total) by mouth daily.   spironolactone (ALDACTONE) 25 MG tablet Take 0.5 tablets (12.5 mg total) by mouth daily.   No facility-administered encounter medications on file as of 01/14/2022.   [place] for [facility] Review Call   Chart Review:  Have there been any documented new, changed, or discontinued medications since last visit? No (If yes, include name, dose, frequency, date) Has there been any documented recent hospitalizations or ED visits since last visit with Clinical Pharmacist? No Brief Summary (including medication and/or Diagnosis changes):   Adherence Review:  Does the Clinical Pharmacist Assistant have access to adherence rates? Yes Adherence rates for STAR metric medications (List medication(s)/day supply/ last 2 fill  dates). Adherence rates for medications indicated for disease state being reviewed (List medication(s)/day supply/ last 2 fill dates). Does the patient have >5 day gap between last estimated fill dates for any of the above medications or other medication gaps? No Reason for medication gaps.   Disease State Questions:  Able to connect with Patient? Yes  Did patient have any problems with their health recently? No Note problems and Concerns:  Have you had any admissions or emergency room visits or worsening of your condition(s) since last visit? No Details of ED visit, hospital visit and/or worsening condition(s):  Have you had any visits with new specialists or providers since your last visit? No Explain:  Have you had any new health care problem(s) since your last visit? No New problem(s) reported:  Have you run out of any of your medications since you last spoke with clinical pharmacist? No What caused you to run out of your medications?  Are there any medications you are not taking as prescribed? No What kept you from taking your medications as prescribed?  Are you having any issues or side effects with your medications? No Note of issues or side effects:  Do you have any other health concerns or questions you want to discuss with your Clinical Pharmacist before your next visit? No Note additional concerns and questions from Patient.  Are there any health concerns that you feel we can do a better job addressing? No Note Patient's response.  Are you having any problems with any of the following since the last visit: (select all that apply)  None  Details:  12. Any falls since last visit? No  Details:  13. Any increased or uncontrolled pain since last visit? No  Details:  14. Next visit Type: office       Visit with:[name] Cannady        Date:01/25/22        Time:10:20am  15. Additional Details? No    Care Gaps: Colonoscopy-NA Diabetic Foot  Exam-07/10/21 Ophthalmology-09/22/21 Dexa Scan - NA Annual Well Visit - 02/04/21 (Medicare)  Micro albumin-07/10/21 Hemoglobin A1c-10/23/21   Star Rating Drugs: Farxiga 10 mg-last fill 12/23/21 30 ds, 11/26/21 30ds Entresto 24-26 mg-last fill 01/04/22 30 ds  [name] Clinical Pharmacist Assistant [phone]

## 2022-01-18 ENCOUNTER — Other Ambulatory Visit: Payer: Self-pay | Admitting: Nurse Practitioner

## 2022-01-19 NOTE — Telephone Encounter (Signed)
Requested Prescriptions  Pending Prescriptions Disp Refills  . carvedilol (COREG) 12.5 MG tablet [Pharmacy Med Name: CARVEDILOL 12.5 MG TABLET] 180 tablet 1    Sig: Take 1 tablet (12.5 mg total) by mouth 2 (two) times daily.     Cardiovascular: Beta Blockers 3 Passed - 01/18/2022 12:02 PM      Passed - Cr in normal range and within 360 days    Creatinine, Ser  Date Value Ref Range Status  07/10/2021 1.27 0.76 - 1.27 mg/dL Final         Passed - AST in normal range and within 360 days    AST  Date Value Ref Range Status  07/10/2021 16 0 - 40 IU/L Final   AST (SGOT) Piccolo, Waived  Date Value Ref Range Status  01/10/2019 25 11 - 38 U/L Final         Passed - ALT in normal range and within 360 days    ALT  Date Value Ref Range Status  07/10/2021 30 0 - 44 IU/L Final   ALT (SGPT) Piccolo, Waived  Date Value Ref Range Status  01/10/2019 35 10 - 47 U/L Final         Passed - Last BP in normal range    BP Readings from Last 1 Encounters:  10/23/21 118/74         Passed - Last Heart Rate in normal range    Pulse Readings from Last 1 Encounters:  10/23/21 70         Passed - Valid encounter within last 6 months    Recent Outpatient Visits          2 months ago Type 2 diabetes mellitus with cardiac complication ([place])   [place] Cannady, [name] T, NP   5 months ago Type 2 diabetes mellitus with cardiac complication ([place])   [place], [name] T, NP   6 months ago Type 2 diabetes mellitus with cardiac complication ([place])   [place], [name] T, NP   9 months ago Type 2 diabetes mellitus with cardiac complication ([place])   [place] Cannady, [name] T, NP   1 year ago Type 2 diabetes mellitus with cardiac complication ([place])   [place], Barbaraann Faster, NP      Future Appointments            In 6 days Venita Lick, NP [employer] [place]   In 3 months Ralene Bathe,  MD [place]

## 2022-01-21 ENCOUNTER — Telehealth: Payer: Self-pay

## 2022-01-21 NOTE — Telephone Encounter (Signed)
Called and LVM asking for patient to please return my call to schedule AWV. Last AWV was 02/04/22, can be done on Friday 02/05/22 if patient is available.  OK for PEC to speak to patient and find out if he is available to do visit this day. Can be done any time in the AM and anytime in the PM EXCEPT 2:00 pm.

## 2022-01-23 NOTE — Patient Instructions (Incomplete)
Ryan [name] -- pharmacist Ryan [name] -- is nurse case manager  Diabetes Mellitus and Exercise Exercising regularly is important for overall health, especially for people who have diabetes mellitus. Exercising is not only about losing weight. It has many other health benefits, such as increasing muscle strength and bone density and reducing body fat and stress. This leads to improved fitness, flexibility, and endurance, all of which result in better overall health. What are the benefits of exercise if I have diabetes? Exercise has many benefits for people with diabetes. They include: Helping to lower and control blood sugar (glucose). Helping the body to respond better to the hormone insulin by improving insulin sensitivity. Reducing how much insulin the body needs. Lowering the risk for heart disease by: Lowering "bad" cholesterol and triglyceride levels. Increasing "good" cholesterol levels. Lowering blood pressure. Lowering blood glucose levels. What is my activity plan? Your health care provider or certified diabetes educator can help you make a plan for the type and frequency of exercise that works for you. This is called your activity plan. Be sure to: Get at least 150 minutes of medium-intensity or high-intensity exercise each week. Exercises may include brisk walking, biking, or water aerobics. Do stretching and strengthening exercises, such as yoga or weight lifting, at least 2 times a week. Spread out your activity over at least 3 days of the week. Get some form of physical activity each day. Do not go more than 2 days in a row without some kind of physical activity. Avoid being inactive for more than 90 minutes at a time. Take frequent breaks to walk or stretch. Choose exercises or activities that you enjoy. Set realistic goals. Start slowly and gradually increase your exercise intensity over time. How do I manage my diabetes during exercise?  Monitor your blood glucose Check your blood  glucose before and after exercising. If your blood glucose is: 240 mg/dL (13.3 mmol/L) or higher before you exercise, check your urine for ketones. These are chemicals created by the liver. If you have ketones in your urine, do not exercise until your blood glucose returns to normal. 100 mg/dL (5.6 mmol/L) or lower, eat a snack containing 15-20 grams of carbohydrate. Check your blood glucose 15 minutes after the snack to make sure that your glucose level is above 100 mg/dL (5.6 mmol/L) before you start your exercise. Know the symptoms of low blood glucose (hypoglycemia) and how to treat it. Your risk for hypoglycemia increases during and after exercise. Follow these tips and your health care provider's instructions Keep a carbohydrate snack that is fast-acting for use before, during, and after exercise to help prevent or treat hypoglycemia. Avoid injecting insulin into areas of the body that are going to be exercised. For example, avoid injecting insulin into: Your arms, when you are about to play tennis. Your legs, when you are about to go jogging. Keep records of your exercise habits. Doing this can help you and your health care provider adjust your diabetes management plan as needed. Write down: Food that you eat before and after you exercise. Blood glucose levels before and after you exercise. The type and amount of exercise you have done. Work with your health care provider when you start a new exercise or activity. He or she may need to: Make sure that the activity is safe for you. Adjust your insulin, other medicines, and food that you eat. Drink plenty of water while you exercise. This prevents loss of water (dehydration) and problems caused by a lot of  heat in the body (heat stroke). Where to find more information American Diabetes Association: www.diabetes.org Summary Exercising regularly is important for overall health, especially for people who have diabetes mellitus. Exercising has  many health benefits. It increases muscle strength and bone density and reduces body fat and stress. It also lowers and controls blood glucose. Your health care provider or certified diabetes educator can help you make an activity plan for the type and frequency of exercise that works for you. Work with your health care provider to make sure any new activity is safe for you. Also work with your health care provider to adjust your insulin, other medicines, and the food you eat. This information is not intended to replace advice given to you by your health care provider. Make sure you discuss any questions you have with your health care provider. Document Revised: 01/22/2019 Document Reviewed: 01/22/2019 Elsevier Patient Education  Ryan [name].

## 2022-01-25 ENCOUNTER — Encounter: Payer: Self-pay | Admitting: Nurse Practitioner

## 2022-01-25 ENCOUNTER — Ambulatory Visit (INDEPENDENT_AMBULATORY_CARE_PROVIDER_SITE_OTHER): Payer: Medicare Other | Admitting: Nurse Practitioner

## 2022-01-25 VITALS — BP 136/80 | HR 65 | Temp 97.9°F | Ht 72.52 in | Wt 199.6 lb

## 2022-01-25 DIAGNOSIS — E1169 Type 2 diabetes mellitus with other specified complication: Secondary | ICD-10-CM | POA: Diagnosis not present

## 2022-01-25 DIAGNOSIS — N183 Chronic kidney disease, stage 3 unspecified: Secondary | ICD-10-CM

## 2022-01-25 DIAGNOSIS — I5022 Chronic systolic (congestive) heart failure: Secondary | ICD-10-CM | POA: Diagnosis not present

## 2022-01-25 DIAGNOSIS — Z95810 Presence of automatic (implantable) cardiac defibrillator: Secondary | ICD-10-CM | POA: Diagnosis not present

## 2022-01-25 DIAGNOSIS — E1122 Type 2 diabetes mellitus with diabetic chronic kidney disease: Secondary | ICD-10-CM

## 2022-01-25 DIAGNOSIS — I152 Hypertension secondary to endocrine disorders: Secondary | ICD-10-CM | POA: Diagnosis not present

## 2022-01-25 DIAGNOSIS — E785 Hyperlipidemia, unspecified: Secondary | ICD-10-CM

## 2022-01-25 DIAGNOSIS — E039 Hypothyroidism, unspecified: Secondary | ICD-10-CM

## 2022-01-25 DIAGNOSIS — I429 Cardiomyopathy, unspecified: Secondary | ICD-10-CM | POA: Diagnosis not present

## 2022-01-25 DIAGNOSIS — E1159 Type 2 diabetes mellitus with other circulatory complications: Secondary | ICD-10-CM

## 2022-01-25 DIAGNOSIS — G720 Drug-induced myopathy: Secondary | ICD-10-CM

## 2022-01-25 LAB — BAYER DCA HB A1C WAIVED: HB A1C (BAYER DCA - WAIVED): 7 % — ABNORMAL HIGH (ref 4.8–5.6)

## 2022-01-25 NOTE — Assessment & Plan Note (Signed)
Chronic, ongoing.  No current medication due to poor tolerance.   Would benefit from statin use with diabetes and heart health or consider injectable, discussed with him today.  Lipid panel today.  He is agreeable to trial of Rosuvastatin on 3 day or 1 day a week schedule if LDL >70 in future if needed.

## 2022-01-25 NOTE — Assessment & Plan Note (Signed)
Due to statin therapy, history of on low doses.  Would benefit from statin use with diabetes and heart health or consider injectable, discussed with him today.  

## 2022-01-25 NOTE — Assessment & Plan Note (Signed)
Chronic, stable with BP at goal on recheck today (initial elevated) and on home readings.  Continue current medication regimen and collaboration with cardiology.  Recommend checking BP at home at least a few mornings a week + focus on DASH diet.  LABS: BMP.  Return in 3 months.

## 2022-01-25 NOTE — Assessment & Plan Note (Signed)
Chronic, ongoing.  Continue current medication regimen and adjust as needed based on labs.  TSH + Free T4 up to date. 

## 2022-01-25 NOTE — Assessment & Plan Note (Signed)
Chronic, ongoing with HF.  A1c 7% today, slight trend up, urine ALB 10 in March 2023.  Rybelsus & Metformin caused GI issues.  Continue current medication regimen with Farxiga and Januvia 100 MG + continue collaboration with cardiology.  Could consider Sulfonylurea at low dose if needed, since he prefers no injectables.  Recommend he check BS at home at least a few mornings a week with goal fasting <130, bring to next visit.  Continue to collaborate with CCM crew.  Return to office in 3 months.

## 2022-01-25 NOTE — Assessment & Plan Note (Signed)
Chronic, stable.  Euvolemic today.  Continue current medication regimen and collaboration with cardiology.  LABS: up to date.   Recommend: - Reminded to call for an overnight weight gain of >2 pounds or a weekly weight weight of >5 pounds - not adding salt to his food and has been reading food labels. Reviewed the importance of keeping daily sodium intake to <2000mg daily  - Avoid NSAIDS 

## 2022-01-25 NOTE — Assessment & Plan Note (Signed)
Ongoing, stable, followed by cardiology.  Continue this collaboration. 

## 2022-01-25 NOTE — Assessment & Plan Note (Signed)
Ongoing, 3a, improved last visit.  Recheck labs today. Urine ALB 17 July 2021 on check.  Consider referral to nephrology if worsening.

## 2022-01-25 NOTE — Assessment & Plan Note (Signed)
Continue collaboration with cardiology and regular checks. 

## 2022-01-25 NOTE — Progress Notes (Signed)
BP 136/80 (BP Location: Left Arm, Patient Position: Sitting, Cuff Size: Normal)   Pulse 65   Temp 97.9 F (36.6 C) (Oral)   Ht 6' 0.52" (1.842 m)   Wt 199 lb 9.6 oz (90.5 kg)   SpO2 98%   BMI 26.68 kg/m    Subjective:    Patient ID: Ryan [name], male    DOB: 1950/08/13, 71 y.o.   [name]: [id number]  HPI: Ryan [name] is a 71 y.o. male  Chief Complaint  Patient presents with   Diabetes   Hyperlipidemia   Hypertension   Hypothyroidism   DIABETES A1c June 6.6%. Takes Wilder Glade (gets via assistance) + Januvia 100 MG daily.  Had intolerance to Metformin and Rybelsus. Prefers no injectables.   Hypoglycemic episodes:no Polydipsia/polyuria: no Visual disturbance: no Chest pain: no Paresthesias: no Glucose Monitoring: no             Accucheck frequency: occasional             Fasting glucose: 140-160, this morning 142             Post prandial:             Evening:             Before meals: Taking Insulin?: no             Long acting insulin:             Short acting insulin: Blood Pressure Monitoring: daily Retinal Examination: Up to Date  Foot Exam: Up to Date Pneumovax: refuses Influenza:  refused Aspirin: no    HYPERTENSION / HYPERLIPIDEMIA/HF Followed by cardiology, Dr. Caryl Comes, last saw 04/14/21 and has cardio-defib implantable (replacement last in January 2012) last check 10/28/21.  Continues on Aldactone, Entresto (gets via assistance), Coreg.  No statin, reports cardiology took him off of this.  Pravastatin in past with poor response -- myalgia.   Satisfied with current treatment? yes Duration of hypertension: chronic BP monitoring frequency: a few times a week BP range: 110-120/80 range BP medication side effects: no Duration of hyperlipidemia: chronic Medication compliance: good compliance Aspirin: no Recent stressors: no Recurrent headaches: no Visual changes: no Palpitations: no Dyspnea: no Chest pain: no Lower extremity edema: no Dizzy/lightheaded: no   The 10-year ASCVD risk score (Arnett DK, et al., 2019) is: 42.5%   Values used to calculate the score:     Age: 61 years     Sex: Male     Is Non-Hispanic African American: No     Diabetic: Yes     Tobacco smoker: No     Systolic Blood Pressure: 867 mmHg     Is BP treated: Yes     HDL Cholesterol: 33 mg/dL     Total Cholesterol: 143 mg/dL   HYPOTHYROIDISM Continues on Levothyroxine 50 MCG daily. Thyroid control status:stable Satisfied with current treatment? yes Medication side effects: no Medication compliance: good compliance Etiology of hypothyroidism: unknown Recent dose adjustment:no Fatigue: no Cold intolerance: no Heat intolerance: no Weight gain: no Weight loss: no Constipation: no Diarrhea/loose stools: no Palpitations: no Lower extremity edema: no Anxiety/depressed mood: no   Relevant past medical, surgical, family and social history reviewed and updated as indicated. Interim medical history since our last visit reviewed. Allergies and medications reviewed and updated.  Review of Systems  Constitutional:  Negative for activity change, diaphoresis, fatigue and fever.  Respiratory:  Negative for cough, chest tightness, shortness of breath and wheezing.   Cardiovascular:  Negative for chest pain, palpitations and leg swelling.  Gastrointestinal: Negative.   Endocrine: Negative for polydipsia, polyphagia and polyuria.  Neurological: Negative.   Psychiatric/Behavioral: Negative.      Per HPI unless specifically indicated above     Objective:    BP 136/80 (BP Location: Left Arm, Patient Position: Sitting, Cuff Size: Normal)   Pulse 65   Temp 97.9 F (36.6 C) (Oral)   Ht 6' 0.52" (1.842 m)   Wt 199 lb 9.6 oz (90.5 kg)   SpO2 98%   BMI 26.68 kg/m   Wt Readings from Last 3 Encounters:  01/25/22 199 lb 9.6 oz (90.5 kg)  10/23/21 199 lb 6.4 oz (90.4 kg)  08/14/21 202 lb (91.6 kg)    Physical Exam Vitals and nursing note reviewed.  Constitutional:       General: He is awake. He is not in acute distress.    Appearance: He is well-developed and well-groomed. He is not ill-appearing.  HENT:     Head: Normocephalic and atraumatic.     Right Ear: Hearing normal. No drainage.     Left Ear: Hearing normal. No drainage.  Eyes:     General: Lids are normal.        Right eye: No discharge.        Left eye: No discharge.     Conjunctiva/sclera: Conjunctivae normal.     Pupils: Pupils are equal, round, and reactive to light.  Neck:     Vascular: No carotid bruit.  Cardiovascular:     Rate and Rhythm: Normal rate and regular rhythm.     Heart sounds: Normal heart sounds, S1 normal and S2 normal. No murmur heard.    No gallop.  Pulmonary:     Effort: Pulmonary effort is normal. No accessory muscle usage or respiratory distress.     Breath sounds: Normal breath sounds.  Abdominal:     General: Bowel sounds are normal.     Palpations: Abdomen is soft.  Musculoskeletal:        General: Normal range of motion.     Cervical back: Normal range of motion and neck supple.     Right lower leg: No edema.     Left lower leg: No edema.  Skin:    General: Skin is warm and dry.     Capillary Refill: Capillary refill takes less than 2 seconds.  Neurological:     Mental Status: He is alert and oriented to person, place, and time.  Psychiatric:        Attention and Perception: Attention normal.        Mood and Affect: Mood normal.        Speech: Speech normal.        Behavior: Behavior normal. Behavior is cooperative.        Thought Content: Thought content normal.    Results for orders placed or performed in visit on 01/25/22  Bayer DCA Hb A1c Waived  Result Value Ref Range   HB A1C (BAYER DCA - WAIVED) 7.0 (H) 4.8 - 5.6 %      Assessment & Plan:   Problem List Items Addressed This Visit       Cardiovascular and Mediastinum   Hypertension associated with diabetes ([place])    Chronic, stable with BP at goal on recheck today (initial elevated)  and on home readings.  Continue current medication regimen and collaboration with cardiology.  Recommend checking BP at home at least a few mornings a week +  focus on DASH diet.  LABS: BMP.  Return in 3 months.      Relevant Orders   Basic metabolic panel   Bayer DCA Hb A1c Waived (Completed)   Idiopathic cardiomyopathy ([place])    Ongoing, stable, followed by cardiology.  Continue this collaboration.      Relevant Orders   Basic metabolic panel   Systolic CHF (HCC)    Chronic, stable.  Euvolemic today.  Continue current medication regimen and collaboration with cardiology.  LABS: up to date.   Recommend: - Reminded to call for an overnight weight gain of >2 pounds or a weekly weight weight of >5 pounds - not adding salt to his food and has been reading food labels. Reviewed the importance of keeping daily sodium intake to '2000mg'$  daily  - Avoid NSAIDS      Relevant Orders   Basic metabolic panel   Type 2 diabetes mellitus with cardiac complication (HCC) - Primary    Chronic, ongoing with HF.  A1c 7% today, slight trend up, urine ALB 10 in March 2023.  Rybelsus & Metformin caused GI issues.  Continue current medication regimen with Farxiga and Januvia 100 MG + continue collaboration with cardiology.  Could consider Sulfonylurea at low dose if needed, since he prefers no injectables.  Recommend he check BS at home at least a few mornings a week with goal fasting <130, bring to next visit.  Continue to collaborate with CCM crew.  Return to office in 3 months.      Relevant Orders   Basic metabolic panel   Bayer DCA Hb A1c Waived (Completed)     Endocrine   CKD stage 3 due to type 2 diabetes mellitus ([place])    Ongoing, 3a, improved last visit.  Recheck labs today. Urine ALB 17 July 2021 on check.  Consider referral to nephrology if worsening.      Relevant Orders   Basic metabolic panel   Bayer DCA Hb A1c Waived (Completed)   Hyperlipidemia associated with type 2 diabetes mellitus  (HCC)    Chronic, ongoing.  No current medication due to poor tolerance.   Would benefit from statin use with diabetes and heart health or consider injectable, discussed with him today.  Lipid panel today.  He is agreeable to trial of Rosuvastatin on 3 day or 1 day a week schedule if LDL >70 in future if needed.      Relevant Orders   Bayer DCA Hb A1c Waived (Completed)   Lipid Panel w/o Chol/HDL Ratio   Hypothyroidism    Chronic, ongoing.  Continue current medication regimen and adjust as needed based on labs.  TSH + Free T4 up to date.        Musculoskeletal and Integument   Drug-induced myopathy    Due to statin therapy, history of on low doses.  Would benefit from statin use with diabetes and heart health or consider injectable, discussed with him today.         Other   Biventricular implantable cardioverter-defibrillator in situ    Continue collaboration with cardiology and regular checks.        Follow up plan: Return in about 3 months (around 04/26/2022) for T2DM, HTN/HLD, GOUT, CKD.

## 2022-01-26 LAB — BASIC METABOLIC PANEL
BUN/Creatinine Ratio: 16 (ref 10–24)
BUN: 20 mg/dL (ref 8–27)
CO2: 21 mmol/L (ref 20–29)
Calcium: 9.5 mg/dL (ref 8.6–10.2)
Chloride: 106 mmol/L (ref 96–106)
Creatinine, Ser: 1.24 mg/dL (ref 0.76–1.27)
Glucose: 131 mg/dL — ABNORMAL HIGH (ref 70–99)
Potassium: 4.4 mmol/L (ref 3.5–5.2)
Sodium: 141 mmol/L (ref 134–144)
eGFR: 62 mL/min/{1.73_m2} (ref >59)

## 2022-01-26 LAB — LIPID PANEL W/O CHOL/HDL RATIO
Cholesterol, Total: 153 mg/dL (ref 100–199)
HDL: 32 mg/dL — ABNORMAL LOW (ref >39)
LDL Chol Calc (NIH): 73 mg/dL (ref 0–99)
Triglycerides: 294 mg/dL — ABNORMAL HIGH (ref 0–149)
VLDL Cholesterol Cal: 48 mg/dL — ABNORMAL HIGH (ref 5–40)

## 2022-01-26 NOTE — Progress Notes (Signed)
Contacted via [place] morning Ryan [name], your labs have returned: - Kidney function, creatinine and eGFR, remains normal. - Cholesterol labs continue to show some elevation in triglycerides and LDL crept up a little.  I do recommend statin therapy, but know you have struggled with this in past, definitely discuss with cardiology too.  For now I recommend taking Omega 3 every day to help lower those triglycerides a little.  Any questions? Keep being awesome!!  Thank you for allowing me to participate in your care.  I appreciate you. Kindest regards, Symone Cornman

## 2022-01-27 ENCOUNTER — Ambulatory Visit (INDEPENDENT_AMBULATORY_CARE_PROVIDER_SITE_OTHER): Payer: Medicare Other

## 2022-01-27 DIAGNOSIS — I428 Other cardiomyopathies: Secondary | ICD-10-CM

## 2022-01-27 LAB — CUP PACEART REMOTE DEVICE CHECK
Battery Remaining Longevity: 24 mo
Battery Voltage: 2.93 V
Brady Statistic AP VP Percent: 8.33 %
Brady Statistic AP VS Percent: 0.01 %
Brady Statistic AS VP Percent: 91.66 %
Brady Statistic AS VS Percent: 0 %
Brady Statistic RA Percent Paced: 8.21 %
Brady Statistic RV Percent Paced: 99.98 %
Date Time Interrogation Session: 20230920023324
HighPow Impedance: 53 Ohm
HighPow Impedance: 69 Ohm
Implantable Lead Implant Date: 20060505
Implantable Lead Implant Date: 20120111
Implantable Lead Implant Date: 20180314
Implantable Lead Location: 753858
Implantable Lead Location: 753859
Implantable Lead Location: 753860
Implantable Lead Model: 5076
Implantable Lead Model: 6947
Implantable Pulse Generator Implant Date: 20180314
Lead Channel Impedance Value: 222.34 Ohm
Lead Channel Impedance Value: 237.5 Ohm
Lead Channel Impedance Value: 237.686
Lead Channel Impedance Value: 255.093
Lead Channel Impedance Value: 255.093
Lead Channel Impedance Value: 418 Ohm
Lead Channel Impedance Value: 418 Ohm
Lead Channel Impedance Value: 475 Ohm
Lead Channel Impedance Value: 475 Ohm
Lead Channel Impedance Value: 551 Ohm
Lead Channel Impedance Value: 589 Ohm
Lead Channel Impedance Value: 646 Ohm
Lead Channel Impedance Value: 722 Ohm
Lead Channel Impedance Value: 779 Ohm
Lead Channel Impedance Value: 779 Ohm
Lead Channel Impedance Value: 893 Ohm
Lead Channel Impedance Value: 950 Ohm
Lead Channel Impedance Value: 988 Ohm
Lead Channel Pacing Threshold Amplitude: 0.5 V
Lead Channel Pacing Threshold Amplitude: 0.625 V
Lead Channel Pacing Threshold Amplitude: 1.375 V
Lead Channel Pacing Threshold Pulse Width: 0.4 ms
Lead Channel Pacing Threshold Pulse Width: 0.4 ms
Lead Channel Pacing Threshold Pulse Width: 0.4 ms
Lead Channel Sensing Intrinsic Amplitude: 0.625 mV
Lead Channel Sensing Intrinsic Amplitude: 0.625 mV
Lead Channel Setting Pacing Amplitude: 1.5 V
Lead Channel Setting Pacing Amplitude: 1.5 V
Lead Channel Setting Pacing Amplitude: 2 V
Lead Channel Setting Pacing Pulse Width: 0.4 ms
Lead Channel Setting Pacing Pulse Width: 0.4 ms
Lead Channel Setting Sensing Sensitivity: 0.3 mV

## 2022-02-05 ENCOUNTER — Ambulatory Visit (INDEPENDENT_AMBULATORY_CARE_PROVIDER_SITE_OTHER): Payer: Medicare Other

## 2022-02-05 DIAGNOSIS — Z0000 Encounter for general adult medical examination without abnormal findings: Secondary | ICD-10-CM | POA: Diagnosis not present

## 2022-02-05 NOTE — Progress Notes (Signed)
I connected with  Ryan [name] on 02/05/22 by a audio enabled telemedicine application and verified that I am speaking with the correct person using two identifiers.  Patient Location: Home  Provider Location: Office/Clinic  I discussed the limitations of evaluation and management by telemedicine. The patient expressed understanding and agreed to proceed.    Subjective:   Ryan [name] is a 71 y.o. male who presents for Medicare Annual/Subsequent preventive examination.  Review of Systems    Defer to PCP.    Objective:    Today's Vitals   02/05/22 1003  PainSc: 0-No pain   There is no height or weight on file to calculate BMI.     02/04/2021    5:13 PM 01/17/2019   11:21 AM 07/05/2017   10:50 AM 04/25/2017    8:40 AM 07/21/2016   10:43 AM 04/14/2016    9:19 AM  Advanced Directives  Does Patient Have a Medical Advance Directive? Yes Yes Yes Yes Yes Yes  Type of Advance Directive Living will Living will;Healthcare Power of [place];Living will [place];Living will [place];Living will Living will  Does patient want to make changes to medical advance directive? No - Patient declined  No - Patient declined  No - Patient declined No - Patient declined  Copy of [place] in Chart?  No - copy requested No - copy requested No - copy requested No - copy requested     Current Medications (verified) Outpatient Encounter Medications as of 02/05/2022  Medication Sig   carvedilol (COREG) 12.5 MG tablet Take 1 tablet (12.5 mg total) by mouth 2 (two) times daily.   Cholecalciferol (VITAMIN D3 SUPER STRENGTH) 50 MCG (2000 UT) CAPS Take 2,000 Units by mouth daily.   dapagliflozin propanediol (FARXIGA) 10 MG TABS tablet Take 10 mg by mouth daily.   dexlansoprazole (DEXILANT) 60 MG capsule Take 1 capsule (60 mg total) by mouth daily.   febuxostat (ULORIC) 40 MG tablet Take 1 tablet (40 mg total) by mouth  daily.   levothyroxine (SYNTHROID) 50 MCG tablet Take 1 tablet (50 mcg total) by mouth daily.   sacubitril-valsartan (ENTRESTO) 24-26 MG Take 1 tablet by mouth 2 (two) times daily.   sitaGLIPtin (JANUVIA) 100 MG tablet Take 1 tablet (100 mg total) by mouth daily.   spironolactone (ALDACTONE) 25 MG tablet Take 0.5 tablets (12.5 mg total) by mouth daily.   No facility-administered encounter medications on file as of 02/05/2022.    Allergies (verified) Ace inhibitors and Crestor [rosuvastatin calcium]   History: Past Medical History:  Diagnosis Date   AICD (automatic cardioverter/defibrillator) present    Anxiety    Complete heart block ([place])    Depression    Diabetes mellitus, type 2 ([place])    Dual ICD (implantable cardiac defibrillator-Medtronic    a. 6949 implanted originally s/p [phone] lead & generator change out 05/2010; b. upgraded to MDT CRT-D in 07/2016   GERD (gastroesophageal reflux disease)    Gout    History of kidney stones    Hypertension    IBS (irritable bowel syndrome)    Kidney stones    Nonischemic cardiomyopathy ([place])    a. Echo 07/2012 EF 35-40%, severe HK of the mid-distalanterior myocardium w/ AK of the distalinferior myocardium, GR1DD, mild MR, mild to mod dilated LA; b/ echo 06/2016 EF 35-40%, severe HK of the mid-apicalanteroseptal and anterior myocardium and severe HK of the apicalinferior myocardium along with AK of  the apical myocardium, GR1DD, calcified mitral annulus, mildly dilated left atrium   Paroxysmal atrial fibrillation (HCC)    a. CHADS2VASc => 3 (CHF, HTN, age x 1); b. on ASA only   Presence of permanent cardiac pacemaker    Syncope    Past Surgical History:  Procedure Laterality Date   BIV UPGRADE N/A 07/21/2016   Procedure: BiV Upgrade;  Surgeon: Deboraha Sprang, MD;  Location: [facility];  Service: Cardiovascular;  Laterality: N/A;   CARDIAC DEFIBRILLATOR PLACEMENT     DIRECT LARYNGOSCOPY Right 07/14/2017   Procedure: MICRODIRECT  LARYNGOSCOPY WITH EXCISION OF RIGHT VOCAL CORD LESION WITH MICRO FLAP TECHNIQUE;  Surgeon: Carloyn Manner, MD;  Location: [facility];  Service: ENT;  Laterality: Right;   LARYNGOSCOPY Right 01/06/2017   Procedure: LARYNGOSCOPY WITH EXCISION OF RIGHT VOCAL CORD LESION;  Surgeon: Carloyn Manner, MD;  Location: [facility];  Service: ENT;  Laterality: Right;   LITHOTRIPSY     UPPER ENDOSCOPY W/ ESOPHAGEAL MANOMETRY     Family History  Problem Relation Age of Onset   Breast cancer Mother    Diabetes Mother    Heart disease Mother    Cancer Mother        breast   Lung cancer Father    Diabetes Father    Cancer Father        lung   Diabetes Sister    Social History   Socioeconomic History   Marital status: Married    Spouse name: Not on file   Number of children: 1   Years of education: 12   Highest education level: 12th grade  Occupational History   Occupation: Self Employed    Employer: [employer]  Tobacco Use   Smoking status: Former    Types: Cigarettes    Quit date: 12/30/1989    Years since quitting: 32.1   Smokeless tobacco: Never  Vaping Use   Vaping Use: Never used  Substance and Sexual Activity   Alcohol use: Yes    Alcohol/week: 7.0 standard drinks of alcohol    Types: 7 Cans of beer per week   Drug use: No   Sexual activity: Yes  Other Topics Concern   Not on file  Social History Narrative   Not on file   Social Determinants of Health   Financial Resource Strain: Low Risk  (02/05/2022)   Overall Financial Resource Strain (CARDIA)    Difficulty of Paying Living Expenses: Not hard at all  Food Insecurity: No Food Insecurity (02/05/2022)   Hunger Vital Sign    Worried About Running Out of Food in the Last Year: Never true    Ran Out of Food in the Last Year: Never true  Transportation Needs: No Transportation Needs (02/05/2022)   PRAPARE - Hydrologist (Medical): No    Lack of Transportation (Non-Medical): No   Physical Activity: Inactive (02/05/2022)   Exercise Vital Sign    Days of Exercise per Week: 0 days    Minutes of Exercise per Session: 0 min  Stress: No Stress Concern Present (02/05/2022)   [place]    Feeling of Stress : Not at all  Social Connections: Moderately Integrated (02/05/2022)   Social Connection and Isolation Panel [NHANES]    Frequency of Communication with Friends and Family: More than three times a week    Frequency of Social Gatherings with Friends and Family: Three times a week  Attends Religious Services: Never    Active Member of Clubs or Organizations: Yes    Attends Archivist Meetings: 1 to 4 times per year    Marital Status: Married    Tobacco Counseling Counseling given: Not Answered   Clinical Intake:  Pre-visit preparation completed: Yes  Pain : No/denies pain Pain Score: 0-No pain  Diabetes: Yes CBG done?: No Did pt. bring in CBG monitor from home?: No  How often do you need to have someone help you when you read instructions, pamphlets, or other written materials from your doctor or pharmacy?: 1 - Never What is the last grade level you completed in school?: 12th grade  Diabetic- No  Interpreter Needed?: No  Activities of Daily Living    02/05/2022   10:11 AM 07/10/2021    8:38 AM  In your present state of health, do you have any difficulty performing the following activities:  Hearing? 0 0  Vision? 0 0  Difficulty concentrating or making decisions? 0 0  Walking or climbing stairs? 0 0  Dressing or bathing? 0 0  Doing errands, shopping? 0 0    Patient Care Team: Venita Lick, NP as PCP - General (Nurse Practitioner) Carloyn Manner, MD as Referring Physician (Otolaryngology) Deboraha Sprang, MD as Consulting Physician (Cardiology) Madelin Rear, [facility] (Inactive) as Pharmacist (Pharmacist)  Indicate any recent Medical Services you may have received  from other than [facility] providers in the past year (date may be approximate).     Assessment:   This is a routine wellness examination for Ryan [name].  Hearing/Vision screen No results found.  Dietary issues and exercise activities discussed:    Goals Addressed   None   Depression Screen    02/05/2022   10:09 AM 01/25/2022   10:19 AM 10/23/2021    8:14 AM 08/14/2021   10:11 AM 07/10/2021    8:14 AM 02/04/2021   11:03 AM 06/04/2020    9:28 AM  PHQ 2/9 Scores  PHQ - 2 Score 0 0 0 0 0 0 0  PHQ- 9 Score 0 0 0 0 0  0    Fall Risk    02/05/2022   10:10 AM 01/25/2022   10:19 AM 10/23/2021    8:14 AM 08/14/2021   10:11 AM 07/10/2021    8:38 AM  Fall Risk   Falls in the past year? 1 1 0 0 0  Number falls in past yr: 0 0 0 0 0  Injury with Fall? 0 0 0 0 0  Risk for fall due to : No Fall Risks No Fall Risks No Fall Risks No Fall Risks No Fall Risks  Follow up Falls evaluation completed Falls evaluation completed Falls evaluation completed Falls evaluation completed Falls evaluation completed    [place]:  Any stairs in or around the home? Yes  If so, are there any without handrails? No  Home free of loose throw rugs in walkways, pet beds, electrical cords, etc? Yes  Adequate lighting in your home to reduce risk of falls? Yes   ASSISTIVE DEVICES UTILIZED TO PREVENT FALLS:  Life alert? No  Use of a cane, walker or w/c? No  Grab bars in the bathroom? No  Shower chair or bench in shower? No  Elevated toilet seat or a handicapped toilet? Yes   TIMED UP AND GO:  Was the test performed? No .   Cognitive Function:    02/05/2022   10:12 AM 02/04/2021  5:13 PM 05/29/2019    8:53 AM 04/25/2017    8:30 AM  6CIT Screen  What Year? 0 points 0 points 0 points 0 points  What month? 0 points 0 points 0 points 0 points  What time? 0 points 0 points 0 points 0 points  Count back from 20 0 points 0 points 0 points 0 points  Months in reverse 0 points 0 points 0  points 0 points  Repeat phrase 0 points 0 points 0 points 0 points  Total Score 0 points 0 points 0 points 0 points    Immunizations Immunization History  Administered Date(s) Administered   Pneumococcal Polysaccharide-23 04/21/2005   Td 06/10/1996, 05/20/2010   Zoster, Live 11/04/2011    TDAP status: Due, Education has been provided regarding the importance of this vaccine. Advised may receive this vaccine at local pharmacy or Health Dept. Aware to provide a copy of the vaccination record if obtained from local pharmacy or Health Dept. Verbalized acceptance and understanding.  Flu Vaccine status: Declined, Education has been provided regarding the importance of this vaccine but patient still declined. Advised may receive this vaccine at local pharmacy or Health Dept. Aware to provide a copy of the vaccination record if obtained from local pharmacy or Health Dept. Verbalized acceptance and understanding.  Pneumococcal vaccine status: Declined,  Education has been provided regarding the importance of this vaccine but patient still declined. Advised may receive this vaccine at local pharmacy or Health Dept. Aware to provide a copy of the vaccination record if obtained from local pharmacy or Health Dept. Verbalized acceptance and understanding.   Covid-19 vaccine status: Declined, Education has been provided regarding the importance of this vaccine but patient still declined. Advised may receive this vaccine at local pharmacy or Health Dept.or vaccine clinic. Aware to provide a copy of the vaccination record if obtained from local pharmacy or Health Dept. Verbalized acceptance and understanding.  Qualifies for Shingles Vaccine? Yes   Zostavax completed No   Shingrix Completed?: No.    Education has been provided regarding the importance of this vaccine. Patient has been advised to call insurance company to determine out of pocket expense if they have not yet received this vaccine. Advised may  also receive vaccine at local pharmacy or Health Dept. Verbalized acceptance and understanding.  Screening Tests Health Maintenance  Topic Date Due   COVID-19 Vaccine (1) Never done   Zoster Vaccines- Shingrix (1 of 2) Never done   COLONOSCOPY (Pts 45-39yr Insurance coverage will need to be confirmed)  Never done   INFLUENZA VACCINE  Never done   Pneumonia Vaccine 71 Years old (2 - PCV) 08/15/2022 (Originally 10/22/2015)   TETANUS/TDAP  10/24/2022 (Originally 05/20/2020)   Diabetic kidney evaluation - Urine ACR  07/11/2022   FOOT EXAM  07/11/2022   HEMOGLOBIN A1C  07/26/2022   OPHTHALMOLOGY EXAM  09/23/2022   Diabetic kidney evaluation - GFR measurement  01/26/2023   Hepatitis C Screening  Completed   HPV VACCINES  Aged Out    Health Maintenance  Health Maintenance Due  Topic Date Due   COVID-19 Vaccine (1) Never done   Zoster Vaccines- Shingrix (1 of 2) Never done   COLONOSCOPY (Pts 45-43yrInsurance coverage will need to be confirmed)  Never done   INFLUENZA VACCINE  Never done    Colorectal Screening- patient declined all screening options.  Lung Cancer Screening: (Low Dose CT Chest recommended if Age 234-80ears, 30 pack-year currently smoking OR have quit w/in 15years.) does  not qualify. Quit smoking over 15 years ago.   Lung Cancer Screening Referral: N/A  Additional Screening:  Hepatitis C Screening: does qualify; Completed 04/25/17  Vision Screening: Recommended annual ophthalmology exams for early detection of glaucoma and other disorders of the eye. Is the patient up to date with their annual eye exam?  Yes  Who is the provider or what is the name of the office in which the patient attends annual eye exams? Dr. Ellin Mayhew If pt is not established with a provider, would they like to be referred to a provider to establish care? Yes .   Dental Screening: Recommended annual dental exams for proper oral hygiene  Community Resource Referral / Chronic Care  Management: CRR required this visit?  No   CCM required this visit?  No      Plan:     I have personally reviewed and noted the following in the patient's chart:   Medical and social history Use of alcohol, tobacco or illicit drugs  Current medications and supplements including opioid prescriptions. Patient is not currently taking opioid prescriptions. Functional ability and status Nutritional status Physical activity Advanced directives List of other physicians Hospitalizations, surgeries, and ER visits in previous 12 months Vitals Screenings to include cognitive, depression, and falls Referrals and appointments  In addition, I have reviewed and discussed with patient certain preventive protocols, quality metrics, and best practice recommendations. A written personalized care plan for preventive services as well as general preventive health recommendations were provided to patient.     Georgina Peer, [place]   02/05/2022   Nurse Notes: Non Face to Face 30 minutes    Ryan [name] , Thank you for taking time to come for your Medicare Wellness Visit. I appreciate your ongoing commitment to your health goals. Please review the following plan we discussed and let me know if I can assist you in the future.   These are the goals we discussed:  Goals       PharmD "I can't afford this medication" (pt-stated)      CARE PLAN ENTRY (see longtitudinal plan of care for additional care plan information)  Current Barriers:  Polypharmacy; complex patient with multiple comorbidities including hx HFrEF, T2DM, HTN, HLD Receiving patient assistance for SGLT2. Patient submitted requested 2020 tax forms and has received medication.. Cost concerns w/ Entresto - relieved; receiving Entresto through patient assistance Most recent eGFR: 52 mL/min HFrEF: Entresto 24/26 mg BID, carvedilol 12.5 mg BID, spironolactone 12.5 mg daily T2DM/HFrEF: Invokana 100 mg daily; A1c 7.2%.  ASCVD risk reduction:  10 year ASCVD risk score 27%. Hx statin intolerance to pravastatin and rosuvastatin w/ significant joint pain. No hx ezetimibe Gout: febuxostat 40 mg daily, last uric acid at goal <6 Hypothyroidism: levothyroxine 50 mcg daily GERD: Dexlansoprazole 60 mg daily  Pharmacist Clinical Goal(s):  Over the next 90 days, patient will work with PharmD and provider towards optimized medication management  Interventions: Comprehensive medication review performed, medication list updated in electronic medical record Inter-disciplinary care team collaboration (see longitudinal plan of care)  Patient Self Care Activities:  Patient will take medications as prescribed  Please see past updates related to this goal by clicking on the "Past Updates" button in the selected goal          This is a list of the screening recommended for you and due dates:  Health Maintenance  Topic Date Due   COVID-19 Vaccine (1) Never done   Zoster (Shingles) Vaccine (1 of 2) Never  done   Colon Cancer Screening  Never done   Flu Shot  Never done   Pneumonia Vaccine (2 - PCV) 08/15/2022*   Tetanus Vaccine  10/24/2022*   Yearly kidney health urinalysis for diabetes  07/11/2022   Complete foot exam   07/11/2022   Hemoglobin A1C  07/26/2022   Eye exam for diabetics  09/23/2022   Yearly kidney function blood test for diabetes  01/26/2023   Hepatitis C Screening: USPSTF Recommendation to screen - Ages 33-79 yo.  Completed   HPV Vaccine  Aged Out  *Topic was postponed. The date shown is not the original due date.

## 2022-02-05 NOTE — Patient Instructions (Signed)
Health Maintenance, Male Adopting a healthy lifestyle and getting preventive care are important in promoting health and wellness. Ask your health care provider about: The right schedule for you to have regular tests and exams. Things you can do on your own to prevent diseases and keep yourself healthy. What should I know about diet, weight, and exercise? Eat a healthy diet  Eat a diet that includes plenty of vegetables, fruits, low-fat dairy products, and lean protein. Do not eat a lot of foods that are high in solid fats, added sugars, or sodium. Maintain a healthy weight Body mass index (BMI) is a measurement that can be used to identify possible weight problems. It estimates body fat based on height and weight. Your health care provider can help determine your BMI and help you achieve or maintain a healthy weight. Get regular exercise Get regular exercise. This is one of the most important things you can do for your health. Most adults should: Exercise for at least 150 minutes each week. The exercise should increase your heart rate and make you sweat (moderate-intensity exercise). Do strengthening exercises at least twice a week. This is in addition to the moderate-intensity exercise. Spend less time sitting. Even light physical activity can be beneficial. Watch cholesterol and blood lipids Have your blood tested for lipids and cholesterol at 71 years of age, then have this test every 5 years. You may need to have your cholesterol levels checked more often if: Your lipid or cholesterol levels are high. You are older than 71 years of age. You are at high risk for heart disease. What should I know about cancer screening? Many types of cancers can be detected early and may often be prevented. Depending on your health history and family history, you may need to have cancer screening at various ages. This may include screening for: Colorectal cancer. Prostate cancer. Skin cancer. Lung  cancer. What should I know about heart disease, diabetes, and high blood pressure? Blood pressure and heart disease High blood pressure causes heart disease and increases the risk of stroke. This is more likely to develop in people who have high blood pressure readings or are overweight. Talk with your health care provider about your target blood pressure readings. Have your blood pressure checked: Every 3-5 years if you are 18-39 years of age. Every year if you are 40 years old or older. If you are between the ages of 65 and 75 and are a current or former smoker, ask your health care provider if you should have a one-time screening for abdominal aortic aneurysm (AAA). Diabetes Have regular diabetes screenings. This checks your fasting blood sugar level. Have the screening done: Once every three years after age 45 if you are at a normal weight and have a low risk for diabetes. More often and at a younger age if you are overweight or have a high risk for diabetes. What should I know about preventing infection? Hepatitis B If you have a higher risk for hepatitis B, you should be screened for this virus. Talk with your health care provider to find out if you are at risk for hepatitis B infection. Hepatitis C Blood testing is recommended for: Everyone born from 1945 through 1965. Anyone with known risk factors for hepatitis C. Sexually transmitted infections (STIs) You should be screened each year for STIs, including gonorrhea and chlamydia, if: You are sexually active and are younger than 71 years of age. You are older than 71 years of age and your   health care provider tells you that you are at risk for this type of infection. Your sexual activity has changed since you were last screened, and you are at increased risk for chlamydia or gonorrhea. Ask your health care provider if you are at risk. Ask your health care provider about whether you are at high risk for HIV. Your health care provider  may recommend a prescription medicine to help prevent HIV infection. If you choose to take medicine to prevent HIV, you should first get tested for HIV. You should then be tested every 3 months for as long as you are taking the medicine. Follow these instructions at home: Alcohol use Do not drink alcohol if your health care provider tells you not to drink. If you drink alcohol: Limit how much you have to 0-2 drinks a day. Know how much alcohol is in your drink. In the [place] one drink equals one 12 oz bottle of beer (355 mL), one 5 oz glass of wine (148 mL), or one 1 oz glass of hard liquor (44 mL). Lifestyle Do not use any products that contain nicotine or tobacco. These products include cigarettes, chewing tobacco, and vaping devices, such as e-cigarettes. If you need help quitting, ask your health care provider. Do not use street drugs. Do not share needles. Ask your health care provider for help if you need support or information about quitting drugs. General instructions Schedule regular health, dental, and eye exams. Stay current with your vaccines. Tell your health care provider if: You often feel depressed. You have ever been abused or do not feel safe at home. Summary Adopting a healthy lifestyle and getting preventive care are important in promoting health and wellness. Follow your health care provider's instructions about healthy diet, exercising, and getting tested or screened for diseases. Follow your health care provider's instructions on monitoring your cholesterol and blood pressure. This information is not intended to replace advice given to you by your health care provider. Make sure you discuss any questions you have with your health care provider. Document Revised: 09/15/2020 Document Reviewed: 09/15/2020 Elsevier Patient Education  2023 [employer]  

## 2022-02-08 ENCOUNTER — Other Ambulatory Visit: Payer: Self-pay | Admitting: Nurse Practitioner

## 2022-02-09 ENCOUNTER — Ambulatory Visit: Payer: Medicare Other | Attending: Internal Medicine | Admitting: Internal Medicine

## 2022-02-09 ENCOUNTER — Encounter: Payer: Self-pay | Admitting: Internal Medicine

## 2022-02-09 VITALS — BP 128/78 | HR 73 | Ht 73.6 in | Wt 196.4 lb

## 2022-02-09 DIAGNOSIS — I428 Other cardiomyopathies: Secondary | ICD-10-CM | POA: Diagnosis not present

## 2022-02-09 DIAGNOSIS — Z95810 Presence of automatic (implantable) cardiac defibrillator: Secondary | ICD-10-CM | POA: Insufficient documentation

## 2022-02-09 DIAGNOSIS — I480 Paroxysmal atrial fibrillation: Secondary | ICD-10-CM | POA: Insufficient documentation

## 2022-02-09 DIAGNOSIS — I5022 Chronic systolic (congestive) heart failure: Secondary | ICD-10-CM | POA: Insufficient documentation

## 2022-02-09 DIAGNOSIS — I10 Essential (primary) hypertension: Secondary | ICD-10-CM | POA: Insufficient documentation

## 2022-02-09 NOTE — Telephone Encounter (Signed)
Requested medication (s) are due for refill today: unsure  Requested medication (s) are on the active medication list:yes  Last refill:  07/10/21  Future visit scheduled: yes  Notes to clinic:  Unable to refill per protocol, request maybe too soon. Last refill 07/10/21 for 45 and 4 rf. E prescribed message states, Class: No Print. Routing for review.     Requested Prescriptions  Pending Prescriptions Disp Refills   spironolactone (ALDACTONE) 25 MG tablet [Pharmacy Med Name: SPIRONOLACTONE 25 MG TABLET] 90 tablet 0    Sig: Take 1 tablet (25 mg total) by mouth daily.     Cardiovascular: Diuretics - Aldosterone Antagonist Passed - 02/08/2022 10:28 AM      Passed - Cr in normal range and within 180 days    Creatinine, Ser  Date Value Ref Range Status  01/25/2022 1.24 0.76 - 1.27 mg/dL Final         Passed - K in normal range and within 180 days    Potassium  Date Value Ref Range Status  01/25/2022 4.4 3.5 - 5.2 mmol/L Final         Passed - Na in normal range and within 180 days    Sodium  Date Value Ref Range Status  01/25/2022 141 134 - 144 mmol/L Final         Passed - eGFR is 30 or above and within 180 days    GFR calc Af Amer  Date Value Ref Range Status  06/04/2020 59 (L) >59 mL/min/1.73 Final    Comment:    **In accordance with recommendations from the NKF-ASN Task force,**   [facility] is in the process of updating its eGFR calculation to the   2021 CKD-EPI creatinine equation that estimates kidney function   without a race variable.    GFR calc non Af Amer  Date Value Ref Range Status  06/04/2020 51 (L) >59 mL/min/1.73 Final   eGFR  Date Value Ref Range Status  01/25/2022 62 >59 mL/min/1.73 Final         Passed - Last BP in normal range    BP Readings from Last 1 Encounters:  01/25/22 136/80         Passed - Valid encounter within last 6 months    Recent Outpatient Visits           2 weeks ago Type 2 diabetes mellitus with cardiac complication ([place])    [place] Cannady, [name] T, NP   3 months ago Type 2 diabetes mellitus with cardiac complication ([place])   [place], [name] T, NP   5 months ago Type 2 diabetes mellitus with cardiac complication ([place])   [place], [name] T, NP   7 months ago Type 2 diabetes mellitus with cardiac complication ([place])   [place], [name] T, NP   10 months ago Type 2 diabetes mellitus with cardiac complication ([place])   [place] Venita Lick, NP       Future Appointments             In 2 months Ralene Bathe, MD [place]   In 3 months Cannady, Barbaraann Faster, NP [facility]

## 2022-02-09 NOTE — Progress Notes (Signed)
Remote ICD transmission.   

## 2022-02-09 NOTE — Patient Instructions (Signed)
Medication Instructions:  - Your physician recommends that you continue on your current medications as directed. Please refer to the Current Medication list given to you today.  *If you need a refill on your cardiac medications before your next appointment, please call your pharmacy*   Lab Work: - none ordered  If you have labs (blood work) drawn today and your tests are completely normal, you will receive your results only by: MyChart Message (if you have MyChart) OR A paper copy in the mail If you have any lab test that is abnormal or we need to change your treatment, we will call you to review the results.   Testing/Procedures: - none ordered   Follow-Up: At [facility], you and your health needs are our priority.  As part of our continuing mission to provide you with exceptional heart care, we have created designated Provider Care Teams.  These Care Teams include your primary Cardiologist (physician) and Advanced Practice Providers (APPs -  Physician Assistants and Nurse Practitioners) who all work together to provide you with the care you need, when you need it.  We recommend signing up for the patient portal called "MyChart".  Sign up information is provided on this After Visit Summary.  MyChart is used to connect with patients for Virtual Visits (Telemedicine).  Patients are able to view lab/test results, encounter notes, upcoming appointments, etc.  Non-urgent messages can be sent to your provider as well.   To learn more about what you can do with MyChart, go to [email or web address].    Your next appointment:   1 year(s)  The format for your next appointment:   In Person  Provider:   [name] Klein, MD    Other Instructions N/a  Important Information About Sugar       

## 2022-02-09 NOTE — Progress Notes (Signed)
skf      Patient Care Team: Venita Lick, NP as PCP - General (Nurse Practitioner) Carloyn Manner, MD as Referring Physician (Otolaryngology) Deboraha Sprang, MD as Consulting Physician (Cardiology) Lane Hacker, [facility] (Pharmacist)   HPI  Ryan [name] is a 71 y.o. male Seen in followup for an ICD implanted for syncope in the setting of a nonischemic cardiomyopathy. He is now device dependent. He had 6949 lead in place and underwent generator replacement with a new 6947-lead January 2012   Underwent CRT upgrade 3/18   The patient denies chest pain, shortness of breath, nocturnal dyspnea, orthopnea or peripheral edema.  There have been no palpitations, lightheadedness or syncope.    Traveling out of [place] along RV; however, locally tethered because his mother-in-law is 17  DATE TEST EF   3/14 Echo 40-45 %   2/18 Echo 35-40 %   3/19 Echo   40-45%       Date Cr K Hgb TSH  3/16  1.31 4.3     12/18 1.34 4.6 16.5 5.01  7/19 1.37 4.1    9/20 1.2 4.2 16.3 (1/20) 3.93  7/21 1.37 4.6 16.6 (1/21)   12/22 1.38 4.7    9/23 1.24 4.4 16.1 4.4        Past Medical History:  Diagnosis Date   AICD (automatic cardioverter/defibrillator) present    Anxiety    Complete heart block ([place])    Depression    Diabetes mellitus, type 2 ([place])    Dual ICD (implantable cardiac defibrillator-Medtronic    a. 6949 implanted originally s/p 6947 lead & generator change out 05/2010; b. upgraded to MDT CRT-D in 07/2016   GERD (gastroesophageal reflux disease)    Gout    History of kidney stones    Hypertension    IBS (irritable bowel syndrome)    Kidney stones    Nonischemic cardiomyopathy ([place])    a. Echo 07/2012 EF 35-40%, severe HK of the mid-distalanterior myocardium w/ AK of the distalinferior myocardium, GR1DD, mild MR, mild to mod dilated LA; b/ echo 06/2016 EF 35-40%, severe HK of the mid-apicalanteroseptal and anterior myocardium and severe HK of the apicalinferior myocardium along with  AK of the apical myocardium, GR1DD, calcified mitral annulus, mildly dilated left atrium   Paroxysmal atrial fibrillation ([place])    a. CHADS2VASc => 3 (CHF, HTN, age x 1); b. on ASA only   Presence of permanent cardiac pacemaker    Syncope     Past Surgical History:  Procedure Laterality Date   BIV UPGRADE N/A 07/21/2016   Procedure: BiV Upgrade;  Surgeon: Deboraha Sprang, MD;  Location: [facility];  Service: Cardiovascular;  Laterality: N/A;   CARDIAC DEFIBRILLATOR PLACEMENT     DIRECT LARYNGOSCOPY Right 07/14/2017   Procedure: MICRODIRECT LARYNGOSCOPY WITH EXCISION OF RIGHT VOCAL CORD LESION WITH MICRO FLAP TECHNIQUE;  Surgeon: Carloyn Manner, MD;  Location: [facility];  Service: ENT;  Laterality: Right;   LARYNGOSCOPY Right 01/06/2017   Procedure: LARYNGOSCOPY WITH EXCISION OF RIGHT VOCAL CORD LESION;  Surgeon: Carloyn Manner, MD;  Location: [facility];  Service: ENT;  Laterality: Right;   LITHOTRIPSY     UPPER ENDOSCOPY W/ ESOPHAGEAL MANOMETRY      Current Outpatient Medications  Medication Sig Dispense Refill   carvedilol (COREG) 12.5 MG tablet Take 1 tablet (12.5 mg total) by mouth 2 (two) times daily. 180 tablet 1   Cholecalciferol (VITAMIN D3 SUPER STRENGTH) 50 MCG (2000 UT) CAPS Take 2,000  Units by mouth daily.     dapagliflozin propanediol (FARXIGA) 10 MG TABS tablet Take 10 mg by mouth daily. 90 tablet 4   dexlansoprazole (DEXILANT) 60 MG capsule Take 1 capsule (60 mg total) by mouth daily. 90 capsule 4   febuxostat (ULORIC) 40 MG tablet Take 1 tablet (40 mg total) by mouth daily. 90 tablet 4   levothyroxine (SYNTHROID) 50 MCG tablet Take 1 tablet (50 mcg total) by mouth daily. 90 tablet 4   sacubitril-valsartan (ENTRESTO) 24-26 MG Take 1 tablet by mouth 2 (two) times daily. 180 tablet 0   sitaGLIPtin (JANUVIA) 100 MG tablet Take 1 tablet (100 mg total) by mouth daily. 90 tablet 4   spironolactone (ALDACTONE) 25 MG tablet Take 1 tablet (25 mg total) by mouth daily. 90  tablet 4   No current facility-administered medications for this visit.    Allergies  Allergen Reactions   Ace Inhibitors     Muscle aches    Crestor [Rosuvastatin Calcium] Other (See Comments)    Muscle aches    Review of Systems negative except from HPI and PMH  Physical Exam BP 128/78   Pulse 73   Ht 6' 1.6" (1.869 m)   Wt 196 lb 6.4 oz (89.1 kg)   SpO2 97%   BMI 25.49 kg/m  Well developed and well nourished in no acute distress HENT normal Neck supple with JVP-flat Clear Device pocket well healed; without hematoma or erythema.  There is no tethering  Regular rate and rhythm, no  gallop No  murmur Abd-soft with active BS No Clubbing cyanosis  edema Skin-warm and dry A & Oriented  Grossly normal sensory and motor function  ECG sinus with P synchronous pacing with an upright QRS lead V1 and negative QRS lead I  Assessment and  Plan  Nonischemic cardiomyopathy     Complete heart block   Paroxysmal atrial fibrillation    Status post ICD-Medtronic CRT    Chronic systolic heart failure     Hypertension   Renal insufficiency  Gd 2  Blood pressures well controlled we will continue him on his guideline directed therapy including Entresto and Aldactone and carvedilol, also for his cardiomyopathy  No interval atrial fibrillation detected.

## 2022-02-10 ENCOUNTER — Telehealth: Payer: Self-pay

## 2022-02-10 NOTE — Telephone Encounter (Signed)
Patient assistance paperwork has been completed by provider and placed in yellow pharmacy folder for pick up.

## 2022-02-10 NOTE — Telephone Encounter (Signed)
Pharmacy team notified 

## 2022-03-08 ENCOUNTER — Ambulatory Visit (INDEPENDENT_AMBULATORY_CARE_PROVIDER_SITE_OTHER): Payer: Medicare Other

## 2022-03-08 DIAGNOSIS — E1159 Type 2 diabetes mellitus with other circulatory complications: Secondary | ICD-10-CM

## 2022-03-08 DIAGNOSIS — E1169 Type 2 diabetes mellitus with other specified complication: Secondary | ICD-10-CM

## 2022-03-08 DIAGNOSIS — I5022 Chronic systolic (congestive) heart failure: Secondary | ICD-10-CM

## 2022-03-08 NOTE — Progress Notes (Signed)
Chronic Care Management Pharmacy Note  03/08/2022 Name:  Ryan [name] [name]:  [id number] DOB:  05/07/1951  Summary: -Very pleasant 71 year old male presents for f/u CCM visit -Very pleasant 71 year old male presents for f/u CCM visit. Worked for 47 years. 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 Lived outside [place]. Lives on farm, loves to travel and camp. Just got back from [place] from a camping trip in October 2023. -Family of 4 siblins. -Nephew is a Software engineer, [employer] trained. Patient is a Fish farm manager -Loves cars, TEFL teacher. Goes to [place], [place] every few years for a car show. Has a burgundy 07 Corvette. Doesn't like the newer ones.   Plan: -Renew Entresto/Dapagliflozin PAP. Will also do Sitagliptin PAP. Patient states he brought in tax information in August. Will let CCM team know -Patient on Feboxostat for Gout. Was on Allopurinol but was changed because, "This new one is good for your kidneys." Change happened 15 years ago and patient had no problems on Allopurinol. Will co-sign PCP on chart to ask to change back to Allopurinol due to increased risk of CV events on Febuxostat.  -Due for AWV  Subjective: Ryan [name] is an 71 y.o. year old male who is a primary patient of Cannady, Barbaraann Faster, NP.  The CCM team was consulted for assistance with disease management and care coordination needs.    Engaged with patient by telephone for follow up visit in response to provider referral for pharmacy case management and/or care coordination services.   Consent to Services:  The patient was given information about Chronic Care Management services, agreed to services, and gave verbal consent prior to initiation of services.  Please see initial visit note for detailed documentation.   Patient Care Team: Venita Lick, NP as PCP - General (Nurse Practitioner) Carloyn Manner, MD as Referring Physician (Otolaryngology) Deboraha Sprang, MD as Consulting Physician (Cardiology) Lane Hacker, [facility]  (Pharmacist)  Objective:  Lab Results  Component Value Date   CREATININE 1.24 01/25/2022   CREATININE 1.27 07/10/2021   CREATININE 1.38 (H) 04/10/2021    Lab Results  Component Value Date   HGBA1C 7.0 (H) 01/25/2022   Last diabetic Eye exam:  Lab Results  Component Value Date/Time   HMDIABEYEEXA No Retinopathy 09/22/2021 12:00 AM    Last diabetic Foot exam: No results found for: "HMDIABFOOTEX"      Component Value Date/Time   CHOL 153 01/25/2022 1023   CHOL 134 01/10/2019 1055   TRIG 294 (H) 01/25/2022 1023   TRIG 260 (H) 01/10/2019 1055   HDL 32 (L) 01/25/2022 1023   CHOLHDL 4.0 05/24/2018 1114   VLDL 52 (H) 01/10/2019 1055   LDLCALC 73 01/25/2022 1023       Latest Ref Rng & Units 07/10/2021    8:16 AM 05/29/2019   10:05 AM 01/10/2019   10:55 AM  Hepatic Function  Total Protein 6.0 - 8.5 g/dL 7.0  7.2    Albumin 3.8 - 4.8 g/dL 4.8  4.8    AST 0 - 40 IU/L _0 ALT 0 - 44 IU/L 30  24  35   Alk Phosphatase 44 - 121 IU/L 46  49    Total Bilirubin 0.0 - 1.2 mg/dL 0.6  0.9      Lab Results  Component Value Date/Time   TSH 4.400 08/14/2021 10:42 AM   TSH 4.630 (H) 07/10/2021 08:16 AM   FREET4 1.20 08/14/2021 10:42 AM   FREET4  1.19 07/10/2021 08:16 AM       Latest Ref Rng & Units 07/10/2021    8:16 AM 05/29/2019   10:05 AM 05/24/2018   11:14 AM  CBC  WBC 3.4 - 10.8 x10E3/uL 5.9  7.1  7.1   Hemoglobin 13.0 - 17.7 g/dL 16.1  16.6  16.3   Hematocrit 37.5 - 51.0 % 48.2  47.4  46.4   Platelets 150 - 450 x10E3/uL 158  197  186     Lab Results  Component Value Date/Time   VD25OH 41.8 07/10/2021 08:16 AM    Clinical ASCVD:  The 10-year ASCVD risk score (Arnett DK, et al., 2019) is: 40.5%   Values used to calculate the score:     Age: 71 years     Sex: Male     Is Non-Hispanic African American: No     Diabetic: Yes     Tobacco smoker: No     Systolic Blood Pressure: 711 mmHg     Is BP treated: Yes     HDL Cholesterol: 32 mg/dL     Total Cholesterol:  153 mg/dL    Other: (CHADS2VASc if Afib, PHQ9 if depression, MMRC or CAT for COPD, ACT, DEXA)  Social History   Tobacco Use  Smoking Status Former   Types: Cigarettes   Quit date: 12/30/1989   Years since quitting: 32.2  Smokeless Tobacco Never   BP Readings from Last 3 Encounters:  02/09/22 128/78  01/25/22 136/80  10/23/21 118/74   Pulse Readings from Last 3 Encounters:  02/09/22 73  01/25/22 65  10/23/21 70   Wt Readings from Last 3 Encounters:  02/09/22 196 lb 6.4 oz (89.1 kg)  01/25/22 199 lb 9.6 oz (90.5 kg)  10/23/21 199 lb 6.4 oz (90.4 kg)    Assessment: Review of patient past medical history, allergies, medications, health status, including review of consultants reports, laboratory and other test data, was performed as part of comprehensive evaluation and provision of chronic care management services.   SDOH:  (Social Determinants of Health) assessments and interventions performed: Yes SDOH Interventions    Flowsheet Row Chronic Care Management from 03/08/2022 in [facility] Most recent reading at 03/08/2022 11:44 AM Clinical Support from 02/05/2022 in [facility] Most recent reading at 02/05/2022 10:10 AM Chronic Care Management from 09/21/2021 in [facility] Most recent reading at 09/21/2021  2:40 PM Chronic Care Management from 04/15/2021 in [facility] Most recent reading at 04/15/2021 10:06 AM Clinical Support from 02/04/2021 in [facility] Most recent reading at 02/04/2021  4:41 PM Chronic Care Management from 02/04/2021 in [facility] Most recent reading at 02/04/2021 11:04 AM  SDOH Interventions        Food Insecurity Interventions -- Intervention Not Indicated Intervention Not Indicated -- Intervention Not Indicated Intervention Not Indicated  Housing Interventions -- Intervention Not Indicated -- -- Intervention Not Indicated Intervention Not Indicated  Transportation Interventions  _0  Intervention Not Indicated  Utilities Interventions -- Intervention Not Indicated -- -- -- --  Alcohol Usage Interventions -- Intervention Not Indicated (Score <7) -- -- -- --  Financial Strain Interventions Other (Comment)  [PAP] Intervention Not Indicated -- -- -- Other (Comment)  [gets patient assistance for medications]  Physical Activity Interventions -- Intervention Not Indicated -- -- Intervention Not Indicated Intervention Not Indicated  Stress Interventions -- Intervention Not Indicated -- -- -- Intervention Not Indicated  Social Connections Interventions -- Intervention Not Indicated -- -- -- Other (Comment), Intervention Not Indicated  [Has a good support system and likes to travel with his wife]      SDOH Interventions    Flowsheet Row Chronic Care Management from 03/08/2022 in [facility] Most recent reading at 03/08/2022 11:44 AM Clinical Support from 02/05/2022 in [facility] Most recent reading at 02/05/2022 10:10 AM Chronic Care Management from 09/21/2021 in [facility] Most recent reading at 09/21/2021  2:40 PM Chronic Care Management from 04/15/2021 in [facility] Most recent reading at 04/15/2021 10:06 AM Clinical Support from 02/04/2021 in [facility] Most recent reading at 02/04/2021  4:41 PM Chronic Care Management from 02/04/2021 in [facility] Most recent reading at 02/04/2021 11:04 AM  SDOH Interventions        Food Insecurity Interventions -- Intervention Not Indicated Intervention Not Indicated -- Intervention Not Indicated Intervention Not Indicated  Housing Interventions -- Intervention Not Indicated -- -- Intervention Not Indicated Intervention Not Indicated  Transportation Interventions Intervention Not Indicated Intervention Not Indicated Intervention Not  Indicated Intervention Not Indicated Intervention Not Indicated Intervention Not Indicated  Utilities Interventions -- Intervention Not Indicated -- -- -- --  Alcohol Usage Interventions -- Intervention Not Indicated (Score <7) -- -- -- --  Financial Strain Interventions Other (Comment)  [PAP] Intervention Not Indicated -- -- -- Other (Comment)  [gets patient assistance for medications]  Physical Activity Interventions -- Intervention Not Indicated -- -- Intervention Not Indicated Intervention Not Indicated  Stress Interventions -- Intervention Not Indicated -- -- -- Intervention Not Indicated  Social Connections Interventions -- Intervention Not Indicated -- -- -- Other (Comment), Intervention Not Indicated  [Has a good support system and likes to travel with his [place] Gaps: Pneumonia Vaccine:Last completed: Apr 21, 2005 [place] completed: Jun 03, 2020   Star Rating Drugs: Sacubitril-valsartan 24-26 mg Last filled:05/05/21 30 DS Farxiga 10 mg Last filled:None noted  CCM Care Plan  Allergies  Allergen Reactions   Ace Inhibitors     Muscle aches    Crestor [Rosuvastatin Calcium] Other (See Comments)    Muscle aches    Medications Reviewed Today     Reviewed by Lane Hacker, [facility] (Pharmacist) on 03/08/22 at 1146  Med List Status: <None>   Medication Order Taking? Sig Documenting Provider Last Dose Status Informant  carvedilol (COREG) 12.5 MG tablet [id number] No Take 1 tablet (12.5 mg total) by mouth 2 (two) times daily. [name] T, NP Taking Active   Cholecalciferol (VITAMIN D3 SUPER STRENGTH) 50 MCG (2000 UT) CAPS [id number] No Take 2,000 Units by mouth daily. [provider] Taking Active   dapagliflozin propanediol (FARXIGA) 10 MG TABS tablet [id number] No Take 10 mg by mouth daily. [name] T, NP Taking Active   dexlansoprazole (DEXILANT) 60 MG capsule [id number] No Take 1 capsule (60 mg total) by mouth daily. [name]  T, NP Taking Active   febuxostat (ULORIC) 40 MG tablet [id number] No Take 1 tablet (40 mg total) by mouth daily. [name] T, NP Taking Active   levothyroxine (SYNTHROID) 50 MCG tablet [id number] No Take 1 tablet (50 mcg total) by mouth daily. [name] T, NP Taking Active   sacubitril-valsartan (ENTRESTO) 24-26 MG [id number] No Take 1 tablet by mouth 2 (two) times daily. Deboraha Sprang, MD Taking Active   sitaGLIPtin [facility]) 100 MG tablet [id number] No Take 1 tablet (100 mg total) by  mouth daily. [name] T, NP Taking Active   spironolactone (ALDACTONE) 25 MG tablet [id number] No Take 1 tablet (25 mg total) by mouth daily. [name] T, NP Taking Active             Patient Active Problem List   Diagnosis Date Noted   CKD stage 3 due to type 2 diabetes mellitus ([place]) 08/30/2019   Type 2 diabetes mellitus with cardiac complication ([place]) 50/27/7412   Hypothyroidism 05/26/2019   Advanced care planning/counseling discussion 87/86/7672   Systolic CHF ([place]) 09/47/0962   BPH (benign prostatic hyperplasia) 04/14/2016   Hypertension associated with diabetes ([place]) 12/16/2014   Gout 12/16/2014   Hyperlipidemia associated with type 2 diabetes mellitus ([place]) 06/13/2012   Biventricular implantable cardioverter-defibrillator in situ 09/28/2010   Idiopathic cardiomyopathy ([place]) 01/27/2009   Drug-induced myopathy 01/27/2009    Immunization History  Administered Date(s) Administered   Pneumococcal Polysaccharide-23 04/21/2005   Td 06/10/1996, 05/20/2010   Zoster, Live 11/04/2011    Conditions to be addressed/monitored: CHF, HTN, HLD, Hypertriglyceridemia, and DMII  Care Plan : [place]  Updates made by Lane Hacker, [place] since 03/08/2022 12:00 AM     Problem: CHL AMB "PATIENT-SPECIFIC PROBLEM"      Long-Range Goal: disease management   Start Date: 09/21/2021  Recent Progress: On track  Note:   Hyperlipidemia: (LDL goal < 70) -Uncontrolled The  10-year ASCVD risk score (Arnett DK, et al., 2019) is: 40.5%   Values used to calculate the score:     Age: 36 years     Sex: Male     Is Non-Hispanic African American: No     Diabetic: Yes     Tobacco smoker: No     Systolic Blood Pressure: 836 mmHg     Is BP treated: Yes     HDL Cholesterol: 32 mg/dL     Total Cholesterol: 153 mg/dL Lab Results  Component Value Date   CHOL 153 01/25/2022   CHOL 143 07/10/2021   CHOL 128 01/09/2021   Lab Results  Component Value Date   HDL 32 (L) 01/25/2022   HDL 33 (L) 07/10/2021   HDL 34 (L) 01/09/2021   Lab Results  Component Value Date   LDLCALC 73 01/25/2022   LDLCALC 70 07/10/2021   LDLCALC 68 01/09/2021   Lab Results  Component Value Date   TRIG 294 (H) 01/25/2022   TRIG 242 (H) 07/10/2021   TRIG 146 01/09/2021   Lab Results  Component Value Date   CHOLHDL 4.0 05/24/2018   CHOLHDL 4.5 04/14/2016  No results found for: "LDLDIRECT" Last vitamin D Lab Results  Component Value Date   VD25OH 41.8 07/10/2021   Lab Results  Component Value Date   TSH 4.400 08/14/2021  -Current treatment: No Rx therapy -Medications previously tried: rosuvastatin (myalgia)  -Current dietary patterns: see dm -Current exercise habits: see dm -Educated on Cholesterol goals;  Benefits of statin for ASCVD risk reduction; -Counseled on diet and exercise extensively  Diabetes (A1c goal <7%) -Controlled Lab Results  Component Value Date   HGBA1C 7.0 (H) 01/25/2022   HGBA1C 6.6 (H) 10/23/2021   HGBA1C 7.5 (H) 07/10/2021   Lab Results  Component Value Date   MICROALBUR 10 07/10/2021   LDLCALC 73 01/25/2022   CREATININE 1.24 01/25/2022   Lab Results  Component Value Date   NA 141 01/25/2022   K 4.4 01/25/2022   CREATININE 1.24 01/25/2022   EGFR 62 01/25/2022   GFRNONAA 51 (L) 06/04/2020  GLUCOSE 131 (H) 01/25/2022   Lab Results  Component Value Date   WBC 5.9 07/10/2021   HGB 16.1 07/10/2021   HCT 48.2 07/10/2021   MCV 92  07/10/2021   PLT 158 07/10/2021   Lab Results  Component Value Date   LABMICR See below: 05/24/2018   LABMICR See below: 04/14/2016   MICROALBUR 10 07/10/2021   MICROALBUR 10 06/04/2020  -Current medications: Farxiga 10 mg (PAP Approved 2023) Appropriate, effective, safe, accessible Januvia 50 mg daily  Appropriate, effective, safe, query-accessible -Medications previously tried: rybelsus - stomach issues/GERD  -Current home glucose readings fasting glucose:  October 2023: Didn't have readings post prandial glucose:  -Denies hypoglycemic/hyperglycemic symptoms -Current meal patterns:  breakfast: 2 cups of coffee, two eggs (+/- bread), strip of bacon or sausage.   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 Not sedentary -Educated on A1c and blood sugar goals; Carbohydrate counting and/or plate method -Counseled to check feet daily and get yearly eye exams October 2023: Sitagliptin PAP. Will renew PAP's for 2024  Heart Failure (Goal: manage symptoms and prevent exacerbations) -Controlled -Last ejection fraction:  3/14 Echo 40-45 %   2/18 Echo 35-40 %   3/19 Echo   40-45%  -HF type: Systolic -NYHA Class: II (slight limitation of activity) -AHA HF Stage: C (Heart disease and symptoms present) -Current treatment: Carvedilol 12.3m BID Appropriate, Effective, Safe, Accessible Dapagliflozin 15mAppropriate, Effective, Safe, Accessible Entresto 24/26 (PAP approved 2023) Appropriate, Effective, Safe, Accessible Spironolactone 2576mppropriate, Effective, Safe, Accessible -Medications previously tried: N/A  -Current home BP/HR readings: didn't have list -Current dietary habits: "Tries to eat healthy" -Current exercise habits: N/A -Educated on Benefits of medications for managing symptoms and  prolonging life -Recommended to continue current medication  Gout (Goal: Prevent gout flares) Lab Results  Component Value Date   LABURIC 3.3 (L) 07/10/2021  -Not ideally controlled -Last Gout Flare: Years ago, can't remember -Current treatment  Feboxostat Query Appropriate,  -Medications previously tried: Allopurinol (Was changed because new drug came out) -We discussed:  Counseled patient on low purine diet plan. Counseled patient to reduce consumption of high-fructose corn syrup, sweetened soft drinks, fruit juices, meat, and seafood. October 2023: Recommend changing back to Allopurinol. Patient agrees with this  CPP F/U April 2024      [facility]  [facility] [id number]: [phone]: [phone] pill box?  Pt endorses 100% compliance  Follow Up:  Patient agrees to Care Plan and Follow-up.  Plan:   Future Appointments  Date Time Provider [place]2/18/2023  1:30 PM KowRalene BatheD [facility] None  04/28/2022  7:20 AM [facility] DEVICE REMOTES [facility]  05/11/2022  9:20 AM CanMarnee Guarneri NP [facility] PEC  07/28/2022  7:20 AM [facility] DEVICE REMOTES [facility]  08/30/2022  9:00 AM CFP CCM PHARMACY [facility] P[facility]   [place]. - 3- [phone]

## 2022-03-08 NOTE — Patient Instructions (Signed)
Visit Information   Goals Addressed   None    Patient Care Plan: RNCM: Heart Failure (Adult)  Completed 12/02/2020   Problem Identified: Symptom Exacerbation (Heart Failure) Resolved 12/02/2020     Long-Range Goal: RNCM: HF Symptom Exacerbation Prevented or Minimized Completed 12/02/2020  Start Date: 04/15/2020  Expected End Date: 08/05/2021  This Visit's Progress: On track  Priority: Medium  Note:   Current Barriers: Closing this goal. Patient HF is stable. Knowledge deficits related to basic heart failure pathophysiology and self care management Does not adhere to provider recommendations re: weighing daily Lack of scale in home- has scales but does not weigh daily Financial strain Very active lifestyle, patient states- "I am busy all of the time" Nurse Case Manager Clinical Goal(s):  Over the next 240 days, patient will weigh self daily and record- ask patient to be mindful of weight shifts and call for changes in weight Over the next 240 days, patient will verbalize understanding of Heart Failure Action Plan and when to call doctor Over the next 90 days, patient will take all Heart Failure mediations as prescribed Interventions:  Basic overview and discussion of pathophysiology of Heart Failure Provided written and verbal education on low sodium diet Reviewed Heart Failure Action Plan in depth and provided written copy Assessed for scales in home- has scales- last weight today 09-03-2020 is 200.  Discussed importance of daily weight- review with the patient. The patient verbalized he does not weigh daily but does not have issues with fluid shifts. Will monitor for changes. Encouraged the patient to call for weight gain of 3 pounds in one day or 5 pounds in one week. 09-03-2020: The patient states that he and his wife just got back from the beach and he is upset with himself about his eating habits. He is euvolemic today but knows he needs to work on his diet and make some changes.  Empathetic listening and support given.  Reviewed role of diuretics in prevention of fluid overload - barriers to lifestyle changes reviewed and addressed - barriers to treatment reviewed and addressed - cognitive screening completed and reviewed - health literacy screening completed or reviewed - medication-adherence assessment completed - self-awareness of signs/symptoms of worsening disease encouraged  Patient Goals/Self-Care Activities Over the next 90 days, patient will:  - Take Heart Failure Medications as prescribed - Weigh daily and record (notify MD with 3 lb weight gain over night or 5 lb in a week)The patient states he does not weigh daily but he is mindful of any weight gain or changes  - Follow CHF Action Plan - Adhere to low sodium diet Follow Up Plan: Telephone follow up appointment with care management team member scheduled for: 11-12-2020 at 0900 am     Task: RNCM: Identify and Minimize Risk of Heart Failure Exacerbation Completed 12/02/2020  Outcome: Positive  Note:   Care Management Activities:    - barriers to lifestyle changes reviewed and addressed - barriers to treatment reviewed and addressed - cognitive screening completed and reviewed - health literacy screening completed or reviewed - medication-adherence assessment completed - self-awareness of signs/symptoms of worsening disease encouraged       Patient Care Plan: RNCM: Diabetes Type 2 (Adult)  Completed 04/15/2021   Problem Identified: Glycemic Management (Diabetes, Type 2) Resolved 04/15/2021     Long-Range Goal: RNCM: DM care plan Completed 04/15/2021  Start Date: 04/15/2020  Expected End Date: 09/29/2021  Recent Progress: On track  Priority: High  Note:   Objective: Resolving,  duplicate goal  Lab Results  Component Value Date   HGBA1C 6.2 (H) 01/09/2021    In the office at today's visit 09-03-2020: HGBA1C was 7.8% Lab Results  Component Value Date   CREATININE 1.24 01/09/2021   CREATININE  1.26 09/03/2020   CREATININE 1.39 (H) 06/04/2020   No results found for: EGFR Current Barriers:  Knowledge Deficits related to basic Diabetes pathophysiology and self care/management Knowledge Deficits related to medications used for management of diabetes Difficulty obtaining or cannot afford medications Does not use cbg meter  Unable to independently manage DM as evidence of hemoglobin A1C of 7.8% (09-03-2020), newest A1C 6.2 on 01-09-2021 Case Manager Clinical Goal(s):  patient will demonstrate improved adherence to prescribed treatment plan for diabetes self care/management as evidenced by: monitoring blood sugar per provider recommendations, adhering to a heart healthy/ADA diet and maintaining hemoglobin A1C of 7.0 or less daily monitoring and recording of CBG- the patient does not check blood sugars daily. Education and support. Will collaborate with pcp to see what expectations of checking blood sugars are adherence to ADA/ carb modified diet- 09-03-2020: Extensive education given to the patient today about the need to monitor dietary intake due to steady incline in hemoglobin A1C. The patient is upset with himself for poor dietary choices. States he and his wife just got back from the beach. He wants to do better. Empathetic listening and support.  exercise 4/5 days/week adherence to prescribed medication regimen.   Interventions:  Provided education to patient about basic DM disease process. 12-02-2020: Reviewed with the patient the importance of checking blood sugars regularly and writing down. Encouraged the patient to bring readings to the pcp appointment for review and recommendations. Praised the patient for consistently taking his blood sugars. Ask the patient to continue to check blood sugars on a consistent basis and ask pcp recommendations on how often he should check his blood sugars.  -Reviewed medications with patient and discussed importance of medication adherence. 09-03-2020:  Will collaborate with pcp and pharm D on the patient not being able to afford the Rybelsus. 02-04-2021: Patient states that he is compliant with his medications and taking as directed.  Discussed plans with patient for ongoing care management follow up and provided patient with direct contact information for care management team Provided patient with written educational materials related to hypo and hyperglycemia and importance of correct treatment. Discussed the importance of recognition of hypo and hyperglycemia and what sx and sx to look for. The patient does not check blood sugars. He denies any episodes of hypo and hyperglycemia.  Will continue to monitor and educate accordingly. 02-04-2021: The patient states that he has not had any lows. States blood sugars have been in good range.  Reviewed scheduled/upcoming provider appointments including:  04-10-2021 at 0920 am Review of patient status, including review of consultants reports, relevant laboratory and other test results, and medications completed. - barriers to adherence to treatment plan  am identified - blood glucose monitoring encouraged at  - mutual A1C goal set or reviewed- 09-03-2020: Goal of hemoglobin A1C is 7 - resources required to improve adherence to care identified. 09-03-2020: New medication started today Rybelsus. The patient states he went to the pharmacy after his visit and after insurance his cost is $700.00. The patient states he can not afford this and will need other options. He told the pcp this. Working with the pharm D to see if there is financial assistance for the patient.  Will continue to monitor. 02-04-2021: The  patient is happy that he got his medications and is taking his medications as directed.  - self-awareness of signs/symptoms of hypo or hyperglycemia encouraged-- Review of sx and sx to monitor for with hypo  and hyperglycemia. 12-02-2020: the patient states he has not had any lows. Recommended if he had any sx and sx  of low blood sugar to check his blood sugars for a reference point. Agrees to this.  Reviewed of goal of hemoglobin A1C of 7.0 or less and that the average blood sugar to achieve a 7.0 is 154. The patient is confident that his A1C will be less than 7.0 on next MD visit. Praised the patient for progress and changes in DM management. Will continue to monitor. 02-04-2021: The patient praised for getting A1C down to 6.2 at recent check on 01-09-2021: The patient states that he is hoping to get it lower. The patient is going on a trip to [place] soon with his wife and he is excited. Discussed taking all medications, adherence to dietary restrictions and monitoring for changes in conditions. The patient denies any acute needs. Will continue to monitor.  -Patient Goals/Self-Care Activities  patient will:  - UNABLE to independently manage DM as evidence of hemoglobin A1C elevated Checks blood sugars as prescribed and utilize hyper and hypoglycemia protocol as needed Adheres to prescribed ADA/carb modified Follow Up Plan: Telephone follow up appointment with care management team member scheduled for:04-15-2021 at 0930 am     Task: RNCM: Alleviate Barriers to Glycemic Management Completed 12/02/2020  Outcome: Positive  Note:   Care Management Activities:    - barriers to adherence to treatment plan identified - blood glucose monitoring encouraged - mutual A1C goal set or reviewed - resources required to improve adherence to care identified - self-awareness of signs/symptoms of hypo or hyperglycemia encouraged       Patient Care Plan: RNCM: Hypertension (Adult) and HLD  Completed 04/15/2021   Problem Identified: Hypertension (Hypertension) Resolved 04/15/2021     Long-Range Goal: RNCM: Management of Hypertension/HLD Completed 04/15/2021  Start Date: 09/03/2020  Expected End Date: 10/05/2021  Recent Progress: On track  Priority: Medium  Note:   Resolving, duplicate goal   BP Readings from Last 3  Encounters:  01/09/21 130/82  09/03/20 135/84  06/04/20 132/85    Lab Results  Component Value Date   CHOL 128 01/09/2021   CHOL 140 09/03/2020   CHOL 147 06/04/2020   Lab Results  Component Value Date   HDL 34 (L) 01/09/2021   HDL 30 (L) 09/03/2020   HDL 33 (L) 06/04/2020   Lab Results  Component Value Date   LDLCALC 68 01/09/2021   LDLCALC 68 09/03/2020   LDLCALC 74 06/04/2020   Lab Results  Component Value Date   TRIG 146 01/09/2021   TRIG 262 (H) 09/03/2020   TRIG 242 (H) 06/04/2020   Lab Results  Component Value Date   CHOLHDL 4.0 05/24/2018   CHOLHDL 4.5 04/14/2016  No results found for: LDLDIRECT  Current Barriers:  Chronic Disease Management support and education needs related to HTN and HLD  Nurse Case Manager Clinical Goal(s):   patient will verbalize understanding of plan for HTN and HLD  patient will meet with RN Care Manager to address barriers to managing HTN and HLD  patient will attend all scheduled medical appointments: next appointment with pcp 04-10-2021 at 0920 am patient will demonstrate improved adherence to prescribed treatment plan for HTN and HLD as evidenced bynormal range  Interventions:  Inter-disciplinary  care team collaboration (see longitudinal plan of care) Evaluation of current treatment plan related to HTN and HLD and patient's adherence to plan as established by provider. 09-03-2020: The patient saw pcp today. Labwork done. The pcp wants the patient to start Rosuvastatin 3 mg daily due to intolerance of statins in the past. The patient agrees to this. The patient is hopeful that with dietary changes his Triglycerides are better; however is willing to try the medications. 12-02-2020: The patient is doing well with management of his HTN and HLD. His blood pressure reading today is 132/75 and HR 69. The patient is taking his blood pressure on a consistent basis. States that he is monitoring what he eats and being health minded. Will continue to  monitor. 02-04-2021: The patient is doing well with management of his HTN and HLD. The patient seen in the office recently and labs were at goal. The patient denies any acute findings. States that he is feeling great. Preparing for an upcoming trip to [place] with his wife. Will continue to monitor.  Advised patient to call for changes in blood pressure or sx/sx of worsening conditions. 02-04-2021: Advised the patient of increase risk of heart attack and stroke with elevated cholesterol levels and blood pressure. Provided education to patient re: sx and sx of HTN, following a heart healthy diet, adequate exercise. 09-03-2020: Review and education given on dietary restrictions. Will send educational material by my chart system on diabetic and DASH diets. 02-04-2021: Review of heart health and will continue to work with the patient for changes in his condition. Denies any acute concerns at this time. Discussed the importance of following heart health/ADA diet especially when he is on vacation and is subject to eat foods that are not healthy.  Reviewed medications with patient and discussed compliance. 09-03-2020: The patient is compliant with medications. Low tolerance in the past to statins but is willing to try low dose of Rosuvastatin. 02-04-2021: States compliance with medications, no new needs at this time.  Discussed plans with patient for ongoing care management follow up and provided patient with direct contact information for care management team Reviewed scheduled/upcoming provider appointments including: 04-10-2021 at 0920 am. Reminded the patient about AWV with nurse today by telephone. Will continue to monitor.   Patient Self Care Activities:  Patient will self administer medications as prescribed Patient will attend all scheduled provider appointments Patient will call pharmacy for medication refills Patient will call provider office for new concerns or questions Unable to independently manage HTN  and HLD as evidence of unable to take statins to manage HLD  Follow up with the patient on 04-15-2021 at 930 am    Task: Identify and Monitor Blood Pressure Elevation Completed 12/02/2020  Outcome: Positive  Note:   Care Management Activities:    - blood pressure trends reviewed - depression screen reviewed - home or ambulatory blood pressure monitoring encouraged    Notes:     Patient Care Plan: RNCM: General Plan of Care (Adult) for Chronic Disease Management and Care Coordination Needs  Completed 10/27/2021   Problem Identified: RNCM: Development of Plan of Care for Chronic Disease Management (CHF, HTN, HLD, DM) Resolved 10/27/2021  Priority: High  Note:   Goals of care have been met. The patients care plan for [facility] has been closed. The patient knows to call for changes or new needs. 10-27-2021: Closing the goal and care plan    Long-Range Goal: RNCM: Effective Management  of Plan of Care for Chronic Disease  Management (CHF, HTN, HLD, DM) Completed 10/27/2021  Start Date: 04/15/2021  Expected End Date: 04/15/2022  Priority: High  Note:   Current Barriers: 10-27-2021: Goals of Care have been met and the Care Plan is being closed. Knowledge Deficits related to plan of care for management of CHF, HTN, HLD, and DMII  Chronic Disease Management support and education needs related to CHF, HTN, HLD, and DMII  RNCM Clinical Goal(s):  Patient will verbalize basic understanding of CHF, HTN, HLD, and DMII disease process and self health management plan as evidenced by keeping appointments, following plan of care, calling the office for questions or concerns, and working with the pcp and CCM team to optimize the plan of care and effectively manage health and well being  take all medications exactly as prescribed and will call provider for medication related questions as evidenced by compliance with medications and calling for refills before running out of medications     attend all scheduled  medical appointments: 01-25-2022 at 1020 am with pcp as evidenced by keeping appointments and calling for schedule change needs         demonstrate improved and ongoing adherence to prescribed treatment plan for CHF, HTN, HLD, and DMII as evidenced by stable conditions, seeing specialist as directed, and calling for changes in condition or questions  demonstrate ongoing self health care management ability for effective management of chronic conditions  as evidenced by working with the CCM team through collaboration with Consulting civil engineer, provider, and care team.   Interventions: 1:1 collaboration with primary care provider regarding development and update of comprehensive plan of care as evidenced by provider attestation and co-signature Inter-disciplinary care team collaboration (see longitudinal plan of care) Evaluation of current treatment plan related to  self management and patient's adherence to plan as established by provider   Heart Failure Interventions:  (Status: Goal Met.)  Long Term Goal 10-27-2021: The patient has met the goals of care.  Basic overview and discussion of pathophysiology of Heart Failure reviewed Provided education on low sodium diet. 10-27-2021: Review of a heart healthy/ADA diet. The patient states that breakfast is his biggest meal of the day and usually he eats a sandwich at lunch time and a meat with a vegetable at supper. He tries to monitor his intake well but admits sometimes he gets a pizza. Education and support given.  Reviewed Heart Failure Action Plan in depth and provided written copy. 06-16-2021: The patient states that he saw his cardiologist in December and his cardiologist stated he had 2 years and 2 months left on his device. The patient states that he has actually gained time on it and that makes him happy. He has a recorder at home that he uses. The patient denies any changes in his conditions and has been approved for assistance with getting his Delene Loll until  the end of 2023. Education and support given. 10-27-2021: The patient is compliant with the plan of care for his heart failure. Sees specialist on a regular basis.  Assessed need for readable accurate scales in home Provided education about placing scale on hard, flat surface Advised patient to weigh each morning after emptying bladder Discussed importance of daily weight and advised patient to weigh and record daily Reviewed role of diuretics in prevention of fluid overload and management of heart failure Discussed the importance of keeping all appointments with provider.pcp appointment on 01-25-2022  Provided patient with education about the role of exercise in the management of heart failure Advised patient  to discuss acute changes of HF with provider Screening for signs and symptoms of depression related to chronic disease state  Assessed social determinant of health barriers EF% 40-45 %  Diabetes:  (Status: Goal Met.) Long Term Goal   Lab Results  Component Value Date   HGBA1C 6.6 (H) 10/23/2021  Previous: 5.6% on 04-10-2021 Assessed patient's understanding of A1c goal: <7%. 10-27-2021: Review of goal of A1C being <7.0%. The patient is currently at goal and praised for the patient at goal. Provided education to patient about basic DM disease process. 08-18-2021: the patient is aware of how to effectively manage DM. He has not been able to tolerate some of the medications for his DM. He is hopeful that his A1C in June will be under 7.0%. Education and support given. 10-27-2021: The patient has a good understanding of his DM and effective management. Review with the patient ; Reviewed medications with patient and discussed importance of medication adherence. 04-15-2021: The patient sent a my chart message to the pcp on 04-14-2021.  The patient has been having increased heartburn and GI symptoms for over a week and he feels like it is caused by the Rybelsus. The patient stopped taking and has not  taken for 2 days. He feels much better and was able to sleep last night. Per pcp note monitoring will continue and a new A1C will be obtained at March visit. Reviewed with the patient to call the office for questions or concerns or consistently low blood sugars or high blood sugars. The patient has a good understanding of effective management of his DM. Verbalized understanding. Wanted to be able to take the Rybelsus but feels this was causing him to have more issues. Emotional listening and support given. 06-16-2021: The patient states that he had to stop taking the Rybelsus because it caused him to have bad acid reflux. The patient says this was really helping him keep his A1C down. The patient states that he really was hoping he could tolerate the medications but his symptoms just kept getting worse. He has discussed this with [name] and will follow up with her in March. Education and support given. 08-18-2021: The patient is no longer taking the Rybelsus but states he has started on the jardiance. The patient states he has gained weight since coming off of the Rybelsus because it curbed his appetite. He has gone from 189 to 56. He denies this being water weight. 10-27-2021: The patient is compliant with medications and has had an increase of  ;        Reviewed prescribed diet with patient heart healthy/ADA diet. 08-18-2021: Review and support given. Review for monitoring carbohydrates and concentrated sweets ; Counseled on importance of regular laboratory monitoring as prescribed. 08-18-2021: The patient will have a new A1C in June.        Discussed plans with patient for ongoing care management follow up and provided patient with direct contact information for care management team;      Provided patient with written educational materials related to hypo and hyperglycemia and importance of correct treatment. 06-16-2021: The patient denies any lows at this time.   The patient states he has not been checking his  blood sugar consistently. He did take it this am and it was 190. Review of sx and sx of hypoglycemia and hyperglycemia.  08-18-2021: Denies any lows or highs. Has not been checking on a consistent basis. Did take his reading on Saturday and Sunday.     Reviewed  scheduled/upcoming provider appointments including: 10-23-2021 at 0820 am;         Advised patient, providing education and rationale, to check cbg when you have symptoms of low or high blood sugar, before and after exercise, and as directed by the pcp  and record . 04-15-2021: The patient states he is getting readings of 111 to 130, denies and extreme lows or highs. Will continue to monitor.   06-16-2021: The patient is not consistently checking his blood sugars. He states he his blood sugar this am was 190. Education and support given. 08-18-2021: The patient has not been checking but after seeing the pcp on Friday he is committed to checking his blood sugars at least 3 times a week. Readings on 08-15-2021 was 170 and 08-16-2021 was 190. Review with the patient the goal of blood sugars <130 fasting and post prandial <180. The patient is thankful for the CCM team helping him with maintaining his health and well being. Will continue to monitor for changes and new needs.   call provider for findings outside established parameters;       Review of patient status, including review of consultants reports, relevant laboratory and other test results, and medications completed;       Advised patient to discuss questions or concerns  with provider;      Last eye exam was January 2022. The patient states he needs to call and schedule an appointment for an eye exam and will do this soon  Hyperlipidemia:  (Status: Goal on Track (progressing): YES.) Long Term Goal  Lab Results  Component Value Date   CHOL 143 07/10/2021   HDL 33 (L) 07/10/2021   LDLCALC 70 07/10/2021   TRIG 242 (H) 07/10/2021   CHOLHDL 4.0 05/24/2018     Medication review performed; medication  list updated in electronic medical record. 08-18-2021: The patient is unable to tolerate statins. The patient is monitored on a consistent basis for effective management of HLD.  Provider established cholesterol goals review. 08-18-2021: Reviewed the goal of LDL being <70; Counseled on importance of regular laboratory monitoring as prescribed. 08-18-2021: The patient has consistent lab work, last in 07-10-2021 Provided HLD educational materials; Reviewed role and benefits of statin for ASCVD risk reduction; Discussed strategies to manage statin-induced myalgias; Reviewed importance of limiting foods high in cholesterol. 08-18-2021: Review of Heart Healthy/ADA diet. The patient is mindful of his dietary intake and watching for foods that can elevated his HLD levels.  Reviewed exercise goals and target of 150 minutes per week;  Hypertension: (Status: Goal on Track (progressing): YES.) Last practice recorded BP readings:  BP Readings from Last 3 Encounters:  08/14/21 114/68  07/10/21 122/81  04/14/21 128/80  Most recent eGFR/CrCl:  Lab Results  Component Value Date   EGFR 61 07/10/2021    No components found for: CRCL  Evaluation of current treatment plan related to hypertension self management and patient's adherence to plan as established by provider. 08-18-2021: Denies any issues with HTN or heart health. Sees cardiologist and pcp on a consistent basis. Will continue to monitor for changes in conditions.  Blood pressures are stable and the patient feels like he is doing well.  Provided education to patient re: stroke prevention, s/s of heart attack and stroke; Reviewed prescribed diet heart healthy/ADA diet  Reviewed medications with patient and discussed importance of compliance. 08-18-2021: The patient is compliant with medications. Denies any new concerns at this time related to medications management;  Discussed plans with patient for ongoing  care management follow up and provided patient with  direct contact information for care management team; Advised patient, providing education and rationale, to monitor blood pressure daily and record, calling PCP for findings outside established parameters;  Advised patient to discuss blood pressure trends or changes in heart health with provider; Provided education on prescribed diet heart healthy/ADA diet ;  Discussed complications of poorly controlled blood pressure such as heart disease, stroke, circulatory complications, vision complications, kidney impairment, sexual dysfunction;   Patient Goals/Self-Care Activities: Take medications as prescribed   Attend all scheduled provider appointments Call pharmacy for medication refills 3-7 days in advance of running out of medications Attend church or other social activities Perform all self care activities independently  Perform IADL's (shopping, preparing meals, housekeeping, managing finances) independently Call provider office for new concerns or questions  Work with the social worker to address care coordination needs and will continue to work with the clinical team to address health care and disease management related needs call the Suicide and Crisis Lifeline: 988 call the [place] National Suicide Prevention Lifeline: (403)099-2183 or TTY: (501)667-9818 TTY (587) 020-4090) to talk to a trained counselor call 1-800-273-TALK (toll free, 24 hour hotline) if experiencing a Mental Health or [place]  call office if I gain more than 2 pounds in one day or 5 pounds in one week keep legs up while sitting track weight in diary use salt in moderation watch for swelling in feet, ankles and legs every day weigh myself daily develop a rescue plan follow rescue plan if symptoms flare-up eat more whole grains, fruits and vegetables, lean meats and healthy fats know when to call the doctorfor changes in sx and sx of CHF to prevent exacerbation track symptoms and what helps feel better or  worse dress right for the weather, hot or cold schedule appointment with eye doctor- Last eye exam January 2022 will call for appointment in new year. 06-16-2021- reminder to call for an appointment given today.  check blood sugar at prescribed times: when you have symptoms of low or high blood sugar, before and after exercise, and as directed   check feet daily for cuts, sores or redness enter blood sugar readings and medication or insulin into daily log take the blood sugar log to all doctor visits trim toenails straight across drink 6 to 8 glasses of water each day eat fish at least once per week fill half of plate with vegetables limit fast food meals to no more than 1 per week manage portion size prepare main meal at home 3 to 5 days each week read food labels for fat, fiber, carbohydrates and portion size reduce red meat to 2 to 3 times a week set a realistic goal keep feet up while sitting wash and dry feet carefully every day wear comfortable, cotton socks wear comfortable, well-fitting shoes check blood pressure 3 times per week choose a place to take my blood pressure (home, clinic or office, retail store) write blood pressure results in a log or diary learn about high blood pressure keep a blood pressure log take blood pressure log to all doctor appointments call doctor for signs and symptoms of high blood pressure develop an action plan for high blood pressure keep all doctor appointments take medications for blood pressure exactly as prescribed report new symptoms to your doctor eat more whole grains, fruits and vegetables, lean meats and healthy fats - call for medicine refill 2 or 3 days before it runs out - take all  medications exactly as prescribed - call doctor with any symptoms you believe are related to your medicine - call doctor when you experience any new symptoms - go to all doctor appointments as scheduled - adhere to prescribed diet: heart healthy/ADA  diet       Patient Care Plan: CCM Pharmacy Care Plan     Problem Identified: CHL AMB "PATIENT-SPECIFIC PROBLEM"      Long-Range Goal: disease management   Start Date: 09/21/2021  Recent Progress: On track  Note:   Hyperlipidemia: (LDL goal < 70) -Uncontrolled The 10-year ASCVD risk score (Arnett DK, et al., 2019) is: 40.5%   Values used to calculate the score:     Age: 89 years     Sex: Male     Is Non-Hispanic African American: No     Diabetic: Yes     Tobacco smoker: No     Systolic Blood Pressure: 557 mmHg     Is BP treated: Yes     HDL Cholesterol: 32 mg/dL     Total Cholesterol: 153 mg/dL Lab Results  Component Value Date   CHOL 153 01/25/2022   CHOL 143 07/10/2021   CHOL 128 01/09/2021   Lab Results  Component Value Date   HDL 32 (L) 01/25/2022   HDL 33 (L) 07/10/2021   HDL 34 (L) 01/09/2021   Lab Results  Component Value Date   LDLCALC 73 01/25/2022   LDLCALC 70 07/10/2021   LDLCALC 68 01/09/2021   Lab Results  Component Value Date   TRIG 294 (H) 01/25/2022   TRIG 242 (H) 07/10/2021   TRIG 146 01/09/2021   Lab Results  Component Value Date   CHOLHDL 4.0 05/24/2018   CHOLHDL 4.5 04/14/2016  No results found for: "LDLDIRECT" Last vitamin D Lab Results  Component Value Date   VD25OH 41.8 07/10/2021   Lab Results  Component Value Date   TSH 4.400 08/14/2021  -Current treatment: No Rx therapy -Medications previously tried: rosuvastatin (myalgia)  -Current dietary patterns: see dm -Current exercise habits: see dm -Educated on Cholesterol goals;  Benefits of statin for ASCVD risk reduction; -Counseled on diet and exercise extensively  Diabetes (A1c goal <7%) -Controlled Lab Results  Component Value Date   HGBA1C 7.0 (H) 01/25/2022   HGBA1C 6.6 (H) 10/23/2021   HGBA1C 7.5 (H) 07/10/2021   Lab Results  Component Value Date   MICROALBUR 10 07/10/2021   LDLCALC 73 01/25/2022   CREATININE 1.24 01/25/2022   Lab Results  Component Value  Date   NA 141 01/25/2022   K 4.4 01/25/2022   CREATININE 1.24 01/25/2022   EGFR 62 01/25/2022   GFRNONAA 51 (L) 06/04/2020   GLUCOSE 131 (H) 01/25/2022   Lab Results  Component Value Date   WBC 5.9 07/10/2021   HGB 16.1 07/10/2021   HCT 48.2 07/10/2021   MCV 92 07/10/2021   PLT 158 07/10/2021   Lab Results  Component Value Date   LABMICR See below: 05/24/2018   LABMICR See below: 04/14/2016   MICROALBUR 10 07/10/2021   MICROALBUR 10 06/04/2020  -Current medications: Farxiga 10 mg (PAP Approved 2023) Appropriate, effective, safe, accessible Januvia 50 mg daily  Appropriate, effective, safe, query-accessible -Medications previously tried: rybelsus - stomach issues/GERD  -Current home glucose readings fasting glucose:  October 2023: Didn't have readings post prandial glucose:  -Denies hypoglycemic/hyperglycemic symptoms -Current meal patterns:  breakfast: 2 cups of coffee, two eggs (+/- bread), strip of bacon or sausage.   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 Not sedentary -Educated on A1c and blood sugar goals; Carbohydrate counting and/or plate method -Counseled to check feet daily and get yearly eye exams October 2023: Sitagliptin PAP. Will renew PAP's for 2024  Heart Failure (Goal: manage symptoms and prevent exacerbations) -Controlled -Last ejection fraction:  3/14 Echo 40-45 %   2/18 Echo 35-40 %   3/19 Echo   40-45%  -HF type: Systolic -NYHA Class: II (slight limitation of activity) -AHA HF Stage: C (Heart disease and symptoms present) -Current treatment: Carvedilol 12.36m BID Appropriate, Effective, Safe, Accessible Dapagliflozin 186mAppropriate, Effective, Safe, Accessible Entresto 24/26 (PAP approved 2023) Appropriate, Effective, Safe, Accessible Spironolactone  2519mppropriate, Effective, Safe, Accessible -Medications previously tried: N/A  -Current home BP/HR readings: didn't have list -Current dietary habits: "Tries to eat healthy" -Current exercise habits: N/A -Educated on Benefits of medications for managing symptoms and prolonging life -Recommended to continue current medication  Gout (Goal: Prevent gout flares) Lab Results  Component Value Date   LABURIC 3.3 (L) 07/10/2021  -Not ideally controlled -Last Gout Flare: Years ago, can't remember -Current treatment  Feboxostat Query Appropriate,  -Medications previously tried: Allopurinol (Was changed because new drug came out) -We discussed:  Counseled patient on low purine diet plan. Counseled patient to reduce consumption of high-fructose corn syrup, sweetened soft drinks, fruit juices, meat, and seafood. October 2023: Recommend changing back to Allopurinol. Patient agrees with this  CPP F/U April 2024       Mr. Graddy was given information about Chronic Care Management services today including:  CCM service includes personalized support from designated clinical staff supervised by his physician, including individualized plan of care and coordination with other care providers 24/7 contact phone numbers for assistance for urgent and routine care needs. Standard insurance, coinsurance, copays and deductibles apply for chronic care management only during months in which we provide at least 20 minutes of these services. Most insurances cover these services at 100%, however patients may be responsible for any copay, coinsurance and/or deductible if applicable. This service may help you avoid the need for more expensive face-to-face services. Only one practitioner may furnish and bill the service in a calendar month. The patient may stop CCM services at any time (effective at the end of the month) by phone call to the office staff.  Patient agreed to services and verbal consent obtained.    The patient verbalized understanding of instructions, educational materials, and care plan provided today and DECLINED offer to receive copy of patient instructions, educational materials, and care plan.  The pharmacy team will reach out to the patient again over the next 60 days.   NatLane HackerPHBelhaven

## 2022-03-09 DIAGNOSIS — E785 Hyperlipidemia, unspecified: Secondary | ICD-10-CM | POA: Diagnosis not present

## 2022-03-09 DIAGNOSIS — E1159 Type 2 diabetes mellitus with other circulatory complications: Secondary | ICD-10-CM

## 2022-03-09 DIAGNOSIS — Z7984 Long term (current) use of oral hypoglycemic drugs: Secondary | ICD-10-CM | POA: Diagnosis not present

## 2022-03-09 DIAGNOSIS — I509 Heart failure, unspecified: Secondary | ICD-10-CM | POA: Diagnosis not present

## 2022-03-18 ENCOUNTER — Other Ambulatory Visit: Payer: Self-pay | Admitting: Nurse Practitioner

## 2022-03-18 DIAGNOSIS — I5022 Chronic systolic (congestive) heart failure: Secondary | ICD-10-CM

## 2022-03-18 DIAGNOSIS — N183 Chronic kidney disease, stage 3 unspecified: Secondary | ICD-10-CM

## 2022-03-18 DIAGNOSIS — I429 Cardiomyopathy, unspecified: Secondary | ICD-10-CM

## 2022-03-18 DIAGNOSIS — E1159 Type 2 diabetes mellitus with other circulatory complications: Secondary | ICD-10-CM

## 2022-03-29 ENCOUNTER — Telehealth: Payer: Self-pay

## 2022-03-29 NOTE — Progress Notes (Signed)
  Chronic Care Management   Note  03/29/2022 Name: Ryan [name] [name]: [id number] DOB: [birth year]  Ryan [name] is a 71 y.o. year old male who is a primary care patient of Cannady, Barbaraann Faster, NP. I reached out to Ryan [name] by phone today in response to a referral sent by Ryan [name] PCP.  Ryan [name]  agreedto scheduling an appointment with the CCM RN Case Manager   Follow up plan: Patient agreed to scheduled appointment with RN Case Manager on 04/09/2022.   Noreene Larsson, [place], [address] [address] Direct Dial: [phone] [email or web address]

## 2022-04-09 ENCOUNTER — Telehealth: Payer: Medicare Other

## 2022-04-09 ENCOUNTER — Ambulatory Visit (INDEPENDENT_AMBULATORY_CARE_PROVIDER_SITE_OTHER): Payer: Medicare Other

## 2022-04-09 ENCOUNTER — Telehealth: Payer: Self-pay

## 2022-04-09 DIAGNOSIS — I5022 Chronic systolic (congestive) heart failure: Secondary | ICD-10-CM

## 2022-04-09 DIAGNOSIS — E1159 Type 2 diabetes mellitus with other circulatory complications: Secondary | ICD-10-CM

## 2022-04-09 NOTE — Chronic Care Management (AMB) (Signed)
Chronic Care Management   CCM RN Visit Note  04/09/2022 Name: Ryan [name] [name]: [id number] DOB: 1950-10-13  Subjective: Ryan [name] is a 71 y.o. year old male who is a primary care patient of Cannady, Barbaraann Faster, NP. The patient was referred to the Chronic Care Management team for assistance with care management needs subsequent to provider initiation of CCM services and plan of care.    Today's Visit:  Engaged with patient by telephone for initial visit.     SDOH Interventions Today    Flowsheet Row Most Recent Value  SDOH Interventions   Food Insecurity Interventions Intervention Not Indicated  Housing Interventions Intervention Not Indicated  Transportation Interventions Intervention Not Indicated  Utilities Interventions Intervention Not Indicated  Alcohol Usage Interventions Intervention Not Indicated (Score <7)  Financial Strain Interventions Other (Comment)  [patient with PAP and working with pharm D for medication assistance]  Physical Activity Interventions Other (Comments)  [encouraged activity, states that he walks and is active but no structured activity]  Stress Interventions Intervention Not Indicated  Social Connections Interventions Intervention Not Indicated         Goals Addressed             This Visit's Progress    CCM Expected Outcome:  Monitor, Self-Manage and Reduce Symptoms of Diabetes       Current Barriers:  Chronic Disease Management support and education needs related to effective management of DM Financial Constraints.   Planned Interventions: Provided education to patient about basic DM disease process; Reviewed medications with patient and discussed importance of medication adherence;        Reviewed prescribed diet with patient heart healthy/ADA diet. Discussed monitoring dietary intake due to the holidays and the desire to eat foods that may alter blood sugars.  The patient states he is trying to be mindful of what he is eating. His wife  is making sweet potato pies instead of pecan pies. The patient is monitoring his blood sugars. ; Counseled on importance of regular laboratory monitoring as prescribed;        Discussed plans with patient for ongoing care management follow up and provided patient with direct contact information for care management team;      Provided patient with written educational materials related to hypo and hyperglycemia and importance of correct treatment. Denies any highs or lows.;       Reviewed scheduled/upcoming provider appointments including: 05-11-2022 at 0920 am;         Advised patient, providing education and rationale, to check cbg once daily and when you have symptoms of low or high blood sugar and record. States his blood sugars this am was 136       Referral made to pharmacy team for assistance with update on Januvia paperwork the patient submitted for help with the cost of Januvia. In basket message sent to the pharm D asking for assistance;       Review of patient status, including review of consultants reports, relevant laboratory and other test results, and medications completed;       Advised patient to discuss changes in DM, questions, or concerns with provider;      Screening for signs and symptoms of depression related to chronic disease state;        Assessed social determinant of health barriers;         Symptom Management: Take medications as prescribed   Attend all scheduled provider appointments Call provider office for new concerns or questions  call the Suicide and Crisis Lifeline: 988 call the [place] National Suicide Prevention Lifeline: 805-820-1880 or TTY: 724 647 2239 TTY (727)628-0034) to talk to a trained counselor call 1-800-273-TALK (toll free, 24 hour hotline) if experiencing a Mental Health or [place]  check feet daily for cuts, sores or redness trim toenails straight across manage portion size wash and dry feet carefully every day wear comfortable,  cotton socks wear comfortable, well-fitting shoes  Follow Up Plan: Telephone follow up appointment with care management team member scheduled for: 05-28-2022 at 9 am       CCM Expected Outcome:  Monitor, Self-Manage and Reduce Symptoms of Heart Failure       Current Barriers:  Knowledge Deficits related to the importance of daily weights in the effective management of HF Chronic Disease Management support and education needs related to effective management of HF Financial Constraints.  Difficulty obtaining medications  Planned Interventions: Basic overview and discussion of pathophysiology of Heart Failure reviewed Provided education on low sodium diet Reviewed Heart Failure Action Plan in depth and provided written copy Assessed need for readable accurate scales in home Provided education about placing scale on hard, flat surface Advised patient to weigh each morning after emptying bladder Discussed importance of daily weight and advised patient to weigh and record daily Reviewed role of diuretics in prevention of fluid overload and management of heart failure Discussed the importance of keeping all appointments with provider Provided patient with education about the role of exercise in the management of heart failure Advised patient to discuss changes in heart failure or heart health with provider Screening for signs and symptoms of depression related to chronic disease state  Assessed social determinant of health barriers In basket message sent to the pcp and pharm D. The patient does not have enough Entresto to finish out the month of December before needing a refill. The patient called the [employer] company and then called the RNCM back and said they need a new script for [employer]. A message has been sent to ask for assistance with a new script to be sent to [employer] and it can be sent by e-scripts. Education provided to the patient. The patient was also going to touch base with the  pharm D for assistance.  Symptom Management: Take medications as prescribed   Attend all scheduled provider appointments Call pharmacy for medication refills 3-7 days in advance of running out of medications Call provider office for new concerns or questions  call the Suicide and Crisis Lifeline: 988 call the [place] National Suicide Prevention Lifeline: 928-720-7089 or TTY: (503) 079-9604 TTY (516) 142-2409) to talk to a trained counselor call 1-800-273-TALK (toll free, 24 hour hotline) if experiencing a Mental Health or [place]  call office if I gain more than 2 pounds in one day or 5 pounds in one week use salt in moderation watch for swelling in feet, ankles and legs every day weigh myself daily develop a rescue plan follow rescue plan if symptoms flare-up track symptoms and what helps feel better or worse dress right for the weather, hot or cold  Follow Up Plan: Telephone follow up appointment with care management team member scheduled for: 05-28-2022 at 0900 am       CCM Expected Outcome:  Monitor, Self-Manage, and Reduce Symptoms of Hypertension       Current Barriers:  Knowledge Deficits related to the importance of monitoring blood pressures on a regular basis to be proactive in monitoring for changes and the increased risk of  heart attack and stroke with blood pressures out of range. Chronic Disease Management support and education needs related to effective management of HTN  Planned Interventions: Evaluation of current treatment plan related to hypertension self management and patient's adherence to plan as established by provider;   Provided education to patient re: stroke prevention, s/s of heart attack and stroke; Reviewed prescribed diet heart healthy/ADA diet  Reviewed medications with patient and discussed importance of compliance;  Discussed plans with patient for ongoing care management follow up and provided patient with direct contact information for  care management team; Advised patient, providing education and rationale, to monitor blood pressure daily and record, calling PCP for findings outside established parameters;  Reviewed scheduled/upcoming provider appointments including: 05-11-2022 at [place] am Advised patient to discuss changes in blood pressures or questions/concerns about heart health with provider; Provided education on prescribed diet heart healthy/ADA diet ;  Discussed complications of poorly controlled blood pressure such as heart disease, stroke, circulatory complications, vision complications, kidney impairment, sexual dysfunction;  Screening for signs and symptoms of depression related to chronic disease state;  Assessed social determinant of health barriers;   Symptom Management: Take medications as prescribed   Attend all scheduled provider appointments Call provider office for new concerns or questions  call the Suicide and Crisis Lifeline: 988 call the [place] National Suicide Prevention Lifeline: (513)738-8381 or TTY: (773)577-6303 TTY 201-674-8713) to talk to a trained counselor call 1-800-273-TALK (toll free, 24 hour hotline) if experiencing a Mental Health or [place]  check blood pressure weekly learn about high blood pressure keep a blood pressure log take blood pressure log to all doctor appointments call doctor for signs and symptoms of high blood pressure develop an action plan for high blood pressure keep all doctor appointments take medications for blood pressure exactly as prescribed report new symptoms to your doctor  Follow Up Plan: Telephone follow up appointment with care management team member scheduled for: 05-28-2022 at 0900 am          Plan:Telephone follow up appointment with care management team member scheduled for:  05-28-2022 at 0900 am  Alice, MSN, CCM RN Care Manager  Chronic Care Management Direct Number: [phone]

## 2022-04-09 NOTE — Progress Notes (Signed)
    Chronic Care Management Pharmacy Assistant   Name: Ryan [name]  [name]: [id number] DOB: 06-02-1950  Spoke with [name] who stated that he spoke with a representative from [employer] today about his entresto. He told them that he only had about 7 to 8 days left of his medication and needs a refill. He was told that that he needed to call his provider so that they can send in a new prescription for Entresto for 90 day supply. Also he need to renew his application for 0569.   Will Prefill/print/ and mail out application in January, application is good through February 2024.

## 2022-04-09 NOTE — Patient Instructions (Signed)
Please call the care guide team at [phone] if you need to cancel or reschedule your appointment.   If you are experiencing a Mental Health or [place] or need someone to talk to, please call the Suicide and Crisis Lifeline: 988 call the [place] National Suicide Prevention Lifeline: 4320547576 or TTY: 315-250-7347 TTY (323) 574-3437) to talk to a trained counselor call 1-800-273-TALK (toll free, 24 hour hotline)   Following is a copy of your full provider care plan:   Goals Addressed             This Visit's Progress    CCM Expected Outcome:  Monitor, Self-Manage and Reduce Symptoms of Diabetes       Current Barriers:  Chronic Disease Management support and education needs related to effective management of DM Financial Constraints.   Planned Interventions: Provided education to patient about basic DM disease process; Reviewed medications with patient and discussed importance of medication adherence;        Reviewed prescribed diet with patient heart healthy/ADA diet. Discussed monitoring dietary intake due to the holidays and the desire to eat foods that may alter blood sugars.  The patient states he is trying to be mindful of what he is eating. His wife is making sweet potato pies instead of pecan pies. The patient is monitoring his blood sugars. ; Counseled on importance of regular laboratory monitoring as prescribed;        Discussed plans with patient for ongoing care management follow up and provided patient with direct contact information for care management team;      Provided patient with written educational materials related to hypo and hyperglycemia and importance of correct treatment. Denies any highs or lows.;       Reviewed scheduled/upcoming provider appointments including: 05-11-2022 at 0920 am;         Advised patient, providing education and rationale, to check cbg once daily and when you have symptoms of low or high blood sugar and record. States his  blood sugars this am was 136       Referral made to pharmacy team for assistance with update on Januvia paperwork the patient submitted for help with the cost of Januvia. In basket message sent to the pharm D asking for assistance;       Review of patient status, including review of consultants reports, relevant laboratory and other test results, and medications completed;       Advised patient to discuss changes in DM, questions, or concerns with provider;      Screening for signs and symptoms of depression related to chronic disease state;        Assessed social determinant of health barriers;         Symptom Management: Take medications as prescribed   Attend all scheduled provider appointments Call provider office for new concerns or questions  call the Suicide and Crisis Lifeline: 988 call the [place] National Suicide Prevention Lifeline: 312-771-5981 or TTY: 6781993000 TTY 218-779-8496) to talk to a trained counselor call 1-800-273-TALK (toll free, 24 hour hotline) if experiencing a Mental Health or [place]  check feet daily for cuts, sores or redness trim toenails straight across manage portion size wash and dry feet carefully every day wear comfortable, cotton socks wear comfortable, well-fitting shoes  Follow Up Plan: Telephone follow up appointment with care management team member scheduled for: 05-28-2022 at 9 am       CCM Expected Outcome:  Monitor, Self-Manage and Reduce Symptoms of Heart Failure  Current Barriers:  Knowledge Deficits related to the importance of daily weights in the effective management of HF Chronic Disease Management support and education needs related to effective management of HF Financial Constraints.  Difficulty obtaining medications  Planned Interventions: Basic overview and discussion of pathophysiology of Heart Failure reviewed Provided education on low sodium diet Reviewed Heart Failure Action Plan in depth and provided  written copy Assessed need for readable accurate scales in home Provided education about placing scale on hard, flat surface Advised patient to weigh each morning after emptying bladder Discussed importance of daily weight and advised patient to weigh and record daily Reviewed role of diuretics in prevention of fluid overload and management of heart failure Discussed the importance of keeping all appointments with provider Provided patient with education about the role of exercise in the management of heart failure Advised patient to discuss changes in heart failure or heart health with provider Screening for signs and symptoms of depression related to chronic disease state  Assessed social determinant of health barriers In basket message sent to the pcp and pharm D. The patient does not have enough Entresto to finish out the month of December before needing a refill. The patient called the [employer] company and then called the RNCM back and said they need a new script for [employer]. A message has been sent to ask for assistance with a new script to be sent to [employer] and it can be sent by e-scripts. Education provided to the patient. The patient was also going to touch base with the pharm D for assistance.  Symptom Management: Take medications as prescribed   Attend all scheduled provider appointments Call pharmacy for medication refills 3-7 days in advance of running out of medications Call provider office for new concerns or questions  call the Suicide and Crisis Lifeline: 988 call the [place] National Suicide Prevention Lifeline: (905) 011-4771 or TTY: 205-848-0922 TTY (304)848-4131) to talk to a trained counselor call 1-800-273-TALK (toll free, 24 hour hotline) if experiencing a Mental Health or [place]  call office if I gain more than 2 pounds in one day or 5 pounds in one week use salt in moderation watch for swelling in feet, ankles and legs every day weigh myself  daily develop a rescue plan follow rescue plan if symptoms flare-up track symptoms and what helps feel better or worse dress right for the weather, hot or cold  Follow Up Plan: Telephone follow up appointment with care management team member scheduled for: 05-28-2022 at 0900 am       CCM Expected Outcome:  Monitor, Self-Manage, and Reduce Symptoms of Hypertension       Current Barriers:  Knowledge Deficits related to the importance of monitoring blood pressures on a regular basis to be proactive in monitoring for changes and the increased risk of heart attack and stroke with blood pressures out of range. Chronic Disease Management support and education needs related to effective management of HTN  Planned Interventions: Evaluation of current treatment plan related to hypertension self management and patient's adherence to plan as established by provider;   Provided education to patient re: stroke prevention, s/s of heart attack and stroke; Reviewed prescribed diet heart healthy/ADA diet  Reviewed medications with patient and discussed importance of compliance;  Discussed plans with patient for ongoing care management follow up and provided patient with direct contact information for care management team; Advised patient, providing education and rationale, to monitor blood pressure daily and record, calling PCP for findings outside  established parameters;  Reviewed scheduled/upcoming provider appointments including: 05-11-2022 at [place] am Advised patient to discuss changes in blood pressures or questions/concerns about heart health with provider; Provided education on prescribed diet heart healthy/ADA diet ;  Discussed complications of poorly controlled blood pressure such as heart disease, stroke, circulatory complications, vision complications, kidney impairment, sexual dysfunction;  Screening for signs and symptoms of depression related to chronic disease state;  Assessed social determinant  of health barriers;   Symptom Management: Take medications as prescribed   Attend all scheduled provider appointments Call provider office for new concerns or questions  call the Suicide and Crisis Lifeline: 988 call the [place] National Suicide Prevention Lifeline: (703)772-8546 or TTY: (854)391-4359 TTY [phone]) to talk to a trained counselor call 1-800-273-TALK (toll free, 24 hour hotline) if experiencing a Mental Health or [place]  check blood pressure weekly learn about high blood pressure keep a blood pressure log take blood pressure log to all doctor appointments call doctor for signs and symptoms of high blood pressure develop an action plan for high blood pressure keep all doctor appointments take medications for blood pressure exactly as prescribed report new symptoms to your doctor  Follow Up Plan: Telephone follow up appointment with care management team member scheduled for: 05-28-2022 at 0900 am          Patient verbalizes understanding of instructions and care plan provided today and agrees to view in [place]. Active MyChart status and patient understanding of how to access instructions and care plan via MyChart confirmed with patient.     Telephone follow up appointment with care management team member scheduled for: 05-28-2022 at 0900 am

## 2022-04-09 NOTE — Plan of Care (Signed)
Chronic Care Management Provider Comprehensive Care Plan    04/09/2022 Name: Ryan [name] [name]: [id number] DOB: 08-Jan-1951  Referral to Chronic Care Management (CCM) services was placed by Provider:  [name], NP on Date: 03-18-2022.  Chronic Condition 1: DM Provider Assessment and Plan   Chronic, ongoing with HF.  A1c 7% today, slight trend up, urine ALB 10 in March 2023.  Rybelsus & Metformin caused GI issues.  Continue current medication regimen with Farxiga and Januvia 100 MG + continue collaboration with cardiology.  Could consider Sulfonylurea at low dose if needed, since he prefers no injectables.  Recommend he check BS at home at least a few mornings a week with goal fasting <130, bring to next visit.  Continue to collaborate with CCM crew.  Return to office in 3 months.         Relevant Orders    Basic metabolic panel    Bayer DCA Hb A1c Waived (Completed)     Expected Outcome/Goals Addressed This Visit (Provider CCM goals/Provider Assessment and plan   CCM (DIABETES) EXPECTED OUTCOME: MONITOR, SELF-MANAGE AND REDUCE SYMPTOMS OF DIABETES   Symptom Management Condition 1: Take all medications as prescribed Attend all scheduled provider appointments Call provider office for new concerns or questions  call the Suicide and Crisis Lifeline: 988 call the [place] National Suicide Prevention Lifeline: 636-038-7628 or TTY: (901)854-4139 [place] (470)051-9801) to talk to a trained counselor call 1-800-273-TALK (toll free, 24 hour hotline) if experiencing a Mental Health or [place]  check feet daily for cuts, sores or redness trim toenails straight across manage portion size wash and dry feet carefully every day wear comfortable, cotton socks wear comfortable, well-fitting shoes  Chronic Condition 2: HF Provider Assessment and Plan  Chronic, stable.  Euvolemic today.  Continue current medication regimen and collaboration with cardiology.  LABS: up to date.    Recommend: - Reminded to call for an overnight weight gain of >2 pounds or a weekly weight weight of >5 pounds - not adding salt to his food and has been reading food labels. Reviewed the importance of keeping daily sodium intake to '2000mg'$  daily  - Avoid NSAIDS         Relevant Orders    Basic metabolic panel     Expected Outcome/Goals Addressed This Visit (Provider CCM goals/Provider Assessment and plan   CCM (HF) EXPECTED OUTCOME: MONITOR, SELF-MANAGE AND REDUCE SYMPTOMS OF Heart Failure  Symptom Management Condition 2: Take all medications as prescribed Attend all scheduled provider appointments Call provider office for new concerns or questions  call the Suicide and Crisis Lifeline: 988 call the [place] National Suicide Prevention Lifeline: 226-436-0576 or TTY: (419)806-4654 TTY 682-372-6706) to talk to a trained counselor call 1-800-273-TALK (toll free, 24 hour hotline) if experiencing a Mental Health or [place]  call office if I gain more than 2 pounds in one day or 5 pounds in one week use salt in moderation watch for swelling in feet, ankles and legs every day weigh myself daily develop a rescue plan follow rescue plan if symptoms flare-up track symptoms and what helps feel better or worse dress right for the weather, hot or cold  Chronic Condition 3: HTN Provider Assessment and Plan  Chronic, stable with BP at goal on recheck today (initial elevated) and on home readings.  Continue current medication regimen and collaboration with cardiology.  Recommend checking BP at home at least a few mornings a week + focus on DASH diet.  LABS:  BMP.  Return in 3 months.         Relevant Orders    Basic metabolic panel    Bayer DCA Hb A1c Waived (Completed)     Expected Outcome/Goals Addressed This Visit (Provider CCM goals/Provider Assessment and plan   CCM (HTN) EXPECTED OUTCOME: MONITOR, SELF-MANAGE AND REDUCE SYMPTOMS OF Hypertension  Symptom Management  Condition 3: Take all medications as prescribed Attend all scheduled provider appointments Call provider office for new concerns or questions  call the Suicide and Crisis Lifeline: 988 call the [place] National Suicide Prevention Lifeline: 682-272-3632 or TTY: 469-011-7622 TTY (916)285-0214) to talk to a trained counselor call 1-800-273-TALK (toll free, 24 hour hotline) if experiencing a Mental Health or [place]  check blood pressure weekly learn about high blood pressure keep a blood pressure log take blood pressure log to all doctor appointments call doctor for signs and symptoms of high blood pressure keep all doctor appointments take medications for blood pressure exactly as prescribed report new symptoms to your doctor  Problem List Patient Active Problem List   Diagnosis Date Noted   CKD stage 3 due to type 2 diabetes mellitus ([place]) 08/30/2019   Type 2 diabetes mellitus with cardiac complication ([place]) [birth year]/1950   Hypothyroidism 05/26/2019   Advanced care planning/counseling discussion 93/26/7124   Systolic CHF ([place]) 58/01/9832   BPH (benign prostatic hyperplasia) 04/14/2016   Hypertension associated with diabetes ([place]) 12/16/2014   Gout 12/16/2014   Hyperlipidemia associated with type 2 diabetes mellitus ([place]) 06/13/2012   Biventricular implantable cardioverter-defibrillator in situ 09/28/2010   Idiopathic cardiomyopathy ([place]) 01/27/2009   Drug-induced myopathy 01/27/2009    Medication Management  Current Outpatient Medications:    carvedilol (COREG) 12.5 MG tablet, Take 1 tablet (12.5 mg total) by mouth 2 (two) times daily., Disp: 180 tablet, Rfl: 1   Cholecalciferol (VITAMIN D3 SUPER STRENGTH) 50 MCG (2000 UT) CAPS, Take 2,000 Units by mouth daily., Disp: , Rfl:    dapagliflozin propanediol (FARXIGA) 10 MG TABS tablet, Take 10 mg by mouth daily., Disp: 90 tablet, Rfl: 4   dexlansoprazole (DEXILANT) 60 MG capsule, Take 1 capsule (60 mg total) by mouth  daily., Disp: 90 capsule, Rfl: 4   febuxostat (ULORIC) 40 MG tablet, Take 1 tablet (40 mg total) by mouth daily., Disp: 90 tablet, Rfl: 4   levothyroxine (SYNTHROID) 50 MCG tablet, Take 1 tablet (50 mcg total) by mouth daily., Disp: 90 tablet, Rfl: 4   sacubitril-valsartan (ENTRESTO) 24-26 MG, Take 1 tablet by mouth 2 (two) times daily., Disp: 180 tablet, Rfl: 0   sitaGLIPtin (JANUVIA) 100 MG tablet, Take 1 tablet (100 mg total) by mouth daily., Disp: 90 tablet, Rfl: 4   spironolactone (ALDACTONE) 25 MG tablet, Take 1 tablet (25 mg total) by mouth daily., Disp: 90 tablet, Rfl: 4  Cognitive Assessment Identity Confirmed: : Name; DOB Cognitive Status: Normal   Functional Assessment Hearing Difficulty or Deaf: no Wear Glasses or Blind: yes Vision Management: wears glasses Concentrating, Remembering or Making Decisions Difficulty (CP): no Difficulty Communicating: no Difficulty Eating/Swallowing: no Walking or Climbing Stairs Difficulty: no Dressing/Bathing Difficulty: no Doing Errands Independently Difficulty (such as shopping) (CP): no Change in Functional Status Since Onset of Current Illness/Injury: no   Caregiver Assessment  Primary Source of Support/Comfort: spouse Name of Support/Comfort Primary Source: [name] Buick People in Home: spouse Name(s) of People in Home: [place] if Needed: spouse Family Caregiver Names: [name] Dickison if needed Primary Roles/Responsibilities: retired   Planned Interventions  Provided education to patient about basic DM disease process; Reviewed medications with patient and discussed importance of medication adherence;        Reviewed prescribed diet with patient heart healthy/ADA diet. Discussed monitoring dietary intake due to the holidays and the desire to eat foods that may alter blood sugars.  The patient states he is trying to be mindful of what he is eating. His wife is making sweet potato pies instead of pecan pies. The patient  is monitoring his blood sugars. ; Counseled on importance of regular laboratory monitoring as prescribed;        Discussed plans with patient for ongoing care management follow up and provided patient with direct contact information for care management team;      Provided patient with written educational materials related to hypo and hyperglycemia and importance of correct treatment. Denies any highs or lows.;       Reviewed scheduled/upcoming provider appointments including: 05-11-2022 at 0920 am;         Advised patient, providing education and rationale, to check cbg once daily and when you have symptoms of low or high blood sugar and record. States his blood sugars this am was 136       Referral made to pharmacy team for assistance with update on Januvia paperwork the patient submitted for help with the cost of Januvia. In basket message sent to the pharm D asking for assistance;       Review of patient status, including review of consultants reports, relevant laboratory and other test results, and medications completed;       Advised patient to discuss changes in DM, questions, or concerns with provider;      Screening for signs and symptoms of depression related to chronic disease state;        Assessed social determinant of health barriers;      Basic overview and discussion of pathophysiology of Heart Failure reviewed Provided education on low sodium diet Reviewed Heart Failure Action Plan in depth and provided written copy Assessed need for readable accurate scales in home Provided education about placing scale on hard, flat surface Advised patient to weigh each morning after emptying bladder Discussed importance of daily weight and advised patient to weigh and record daily Reviewed role of diuretics in prevention of fluid overload and management of heart failure Discussed the importance of keeping all appointments with provider Provided patient with education about the role of exercise in  the management of heart failure Advised patient to discuss changes in heart failure or heart health with provider Screening for signs and symptoms of depression related to chronic disease state  Assessed social determinant of health barriers In basket message sent to the pcp and pharm D. The patient does not have enough Entresto to finish out the month of December before needing a refill. The patient called the [employer] company and then called the RNCM back and said they need a new script for [employer]. A message has been sent to ask for assistance with a new script to be sent to [employer] and it can be sent by e-scripts. Education provided to the patient. The patient was also going to touch base with the pharm D for assistance.   Evaluation of current treatment plan related to hypertension self management and patient's adherence to plan as established by provider;   Provided education to patient re: stroke prevention, s/s of heart attack and stroke; Reviewed prescribed diet heart healthy/ADA diet  Reviewed medications with patient and  discussed importance of compliance;  Discussed plans with patient for ongoing care management follow up and provided patient with direct contact information for care management team; Advised patient, providing education and rationale, to monitor blood pressure daily and record, calling PCP for findings outside established parameters;  Reviewed scheduled/upcoming provider appointments including: 05-11-2022 at [place] am Advised patient to discuss changes in blood pressures or questions/concerns about heart health with provider; Provided education on prescribed diet heart healthy/ADA diet ;  Discussed complications of poorly controlled blood pressure such as heart disease, stroke, circulatory complications, vision complications, kidney impairment, sexual dysfunction;  Screening for signs and symptoms of depression related to chronic disease state;  Assessed social determinant  of health barriers;     Interaction and coordination with outside resources, practitioners, and providers See CCM Referral  Care Plan: Available in MyChart

## 2022-04-12 ENCOUNTER — Other Ambulatory Visit: Payer: Self-pay | Admitting: Nurse Practitioner

## 2022-04-12 ENCOUNTER — Other Ambulatory Visit: Payer: Self-pay

## 2022-04-12 MED ORDER — ENTRESTO 24-26 MG PO TABS: 1.0000 | ORAL_TABLET | Freq: Two times a day (BID) | ORAL | 0 refills | Status: DC

## 2022-04-12 NOTE — Telephone Encounter (Signed)
-----   Message from Wendy Poet sent at 04/09/2022  3:51 PM EST ----- Regarding: New prescription [facility],   I spoke with Mr. Slayden today and he is needing [name] to fax him in a new prescription for Entresto to [employer] for a 90 ds. The fax number is [phone], to be faxed with cover sheet.   [place] [phone]

## 2022-04-13 ENCOUNTER — Other Ambulatory Visit: Payer: Self-pay

## 2022-04-13 MED ORDER — ENTRESTO 24-26 MG PO TABS: 1.0000 | ORAL_TABLET | Freq: Two times a day (BID) | ORAL | 0 refills | Status: DC

## 2022-04-13 NOTE — Telephone Encounter (Signed)
Medication refill for Entresto 24-'26mg'$  last ov 01/25/22, upcoming ov 05/11/22 . Please advise  Prescription needs printed in order to fax to [employer]

## 2022-04-14 NOTE — Telephone Encounter (Signed)
Prescription has been faxed to [employer]

## 2022-04-15 ENCOUNTER — Telehealth: Payer: Self-pay | Admitting: Internal Medicine

## 2022-04-15 MED ORDER — ENTRESTO 24-26 MG PO TABS: 1.0000 | ORAL_TABLET | Freq: Two times a day (BID) | ORAL | 1 refills | Status: DC

## 2022-04-15 NOTE — Progress Notes (Signed)
Chronic Care Management Pharmacy Assistant   Name: Ryan [name]  [name]: [id number] DOB: Nov 13, 1950  Reason for Encounter: Disease State   Conditions to be addressed/monitored: DMII   Recent office visits:  None since last coordination call  Recent consult visits:  None since last coordination call  Hospital visits:  None in previous 6 months  Medications: Outpatient Encounter Medications as of 04/09/2022  Medication Sig   carvedilol (COREG) 12.5 MG tablet Take 1 tablet (12.5 mg total) by mouth 2 (two) times daily.   Cholecalciferol (VITAMIN D3 SUPER STRENGTH) 50 MCG (2000 UT) CAPS Take 2,000 Units by mouth daily.   dapagliflozin propanediol (FARXIGA) 10 MG TABS tablet Take 10 mg by mouth daily.   dexlansoprazole (DEXILANT) 60 MG capsule Take 1 capsule (60 mg total) by mouth daily.   febuxostat (ULORIC) 40 MG tablet Take 1 tablet (40 mg total) by mouth daily.   levothyroxine (SYNTHROID) 50 MCG tablet Take 1 tablet (50 mcg total) by mouth daily.   sitaGLIPtin (JANUVIA) 100 MG tablet Take 1 tablet (100 mg total) by mouth daily.   spironolactone (ALDACTONE) 25 MG tablet Take 1 tablet (25 mg total) by mouth daily.   [DISCONTINUED] sacubitril-valsartan (ENTRESTO) 24-26 MG Take 1 tablet by mouth 2 (two) times daily.   No facility-administered encounter medications on file as of 04/09/2022.   Recent Relevant Labs: Lab Results  Component Value Date/Time   HGBA1C 7.0 (H) 01/25/2022 10:20 AM   HGBA1C 6.6 (H) 10/23/2021 09:22 AM   HGBA1C 6.9 11/08/2017 12:00 AM   HGBA1C 6.9 11/08/2017 12:00 AM   MICROALBUR 10 07/10/2021 08:44 AM   MICROALBUR 10 06/04/2020 09:35 AM    Kidney Function Lab Results  Component Value Date/Time   CREATININE 1.24 01/25/2022 10:23 AM   CREATININE 1.27 07/10/2021 08:16 AM   GFRNONAA 51 (L) 06/04/2020 09:37 AM   GFRAA 59 (L) 06/04/2020 09:37 AM    Current antihyperglycemic regimen:  Farxiga 10 mg 1 tab daily Januvia 100 mg 1 tab daily  What recent  interventions/DTPs have been made to improve glycemic control:  Continue current medication regimen with Wilder Glade and Januvia 100 MG + continue collaboration with cardiology. Note: 01/25/22  Have there been any recent hospitalizations or ED visits since last visit with CPP? No  Patient denies hypoglycemic symptoms, including None  Patient denies hyperglycemic symptoms, including none  How often are you checking your blood sugar? Patient states that he does not check BS readings daily. I asked the patient if he could write down BS for the next several days and that I will call him back 04/22/22 for the readings.  What are your blood sugars ranging? 130-180 Fasting: 141 yesterday morning  During the week, how often does your blood glucose drop below 70? Never  Are you checking your feet daily/regularly? No issues with feet  Adherence Review: Is the patient currently on a STATIN medication? No Is the patient currently on ACE/ARB medication? Yes Does the patient have >5 day gap between last estimated fill dates? Yes  Note: Patient stated that he only has about 3 to 4 tabs left of Entresto. He stated that he called [employer] and was told that he needed a new Rx faxed to them. Patient states that since he will be out if he could get a Rx sent to his pharmacy [facility], until he is renewed with [employer].  Care Gaps: Colonoscopy-NA Diabetic Foot Exam-07/10/21 Ophthalmology-09/22/21 Dexa Scan - NA Annual Well Visit - 01/30/22 (Medicare)  Micro albumin-07/10/21 Hemoglobin A1c-01/25/22 (7.0), 10/23/21 (6.6)  Star Rating Drugs: Farxiga 10 mg-last fill 12/23/21 30 ds, 11/26/21 30ds (Farxiga came in yesterday for 5 months supply) Entresto 24-26 mg-last fill 01/04/22 30 ds, 07/13/21 5 ds   [place] [phone]

## 2022-04-15 NOTE — Telephone Encounter (Signed)
Pt c/o medication issue:  1. Name of Medication:  sacubitril-valsartan (ENTRESTO) 24-26 MG   2. How are you currently taking this medication (dosage and times per day)? Take 1 tablet by mouth 2 (two) times daily.   3. Are you having a reaction (difficulty breathing--STAT)?   4. What is your medication issue? Patient states he gets it from [employer] [facility] [employer].  He states needs a script sent to them.  He only has three days left.

## 2022-04-15 NOTE — Telephone Encounter (Addendum)
Entresto 24-'26mg'$  - 1 tablet by mouth twice daily #180 with 1RF sent to pharmacy as requested.

## 2022-04-21 NOTE — Telephone Encounter (Signed)
Approached by the front office staff that this patient was in the office requesting Entresto samples as his mail order RX has not arrived.  Samples Given: Entresto 24/26 mg Lot: [id number] Exp: July 2025 # 1 bottle

## 2022-04-21 NOTE — Telephone Encounter (Signed)
Patient calling the office for samples of medication:   1.  What medication and dosage are you requesting samples for? Entresto  2.  Are you currently out of this medication? Has 4 more days left  Patient received phone call was told they were at [facility] location we are not aware of this

## 2022-04-22 ENCOUNTER — Ambulatory Visit: Payer: Self-pay

## 2022-04-22 ENCOUNTER — Telehealth: Payer: Self-pay | Admitting: Nurse Practitioner

## 2022-04-22 DIAGNOSIS — E1159 Type 2 diabetes mellitus with other circulatory complications: Secondary | ICD-10-CM

## 2022-04-22 DIAGNOSIS — I5022 Chronic systolic (congestive) heart failure: Secondary | ICD-10-CM

## 2022-04-22 NOTE — Patient Instructions (Signed)
Please call the care guide team at [phone] if you need to cancel or reschedule your appointment.   If you are experiencing a Mental Health or [place] or need someone to talk to, please call the Suicide and Crisis Lifeline: 988 call the [place] National Suicide Prevention Lifeline: 939-599-8416 or TTY: (708) 681-5910 TTY 515-207-7004) to talk to a trained counselor call 1-800-273-TALK (toll free, 24 hour hotline)   Following is a copy of your full provider care plan:   Goals Addressed             This Visit's Progress    CCM Expected Outcome:  Monitor, Self-Manage and Reduce Symptoms of Heart Failure       Current Barriers:  Knowledge Deficits related to the importance of daily weights in the effective management of HF Chronic Disease Management support and education needs related to effective management of HF Financial Constraints.  Difficulty obtaining medications  Planned Interventions: Basic overview and discussion of pathophysiology of Heart Failure reviewed Provided education on low sodium diet Reviewed Heart Failure Action Plan in depth and provided written copy Assessed need for readable accurate scales in home Provided education about placing scale on hard, flat surface Advised patient to weigh each morning after emptying bladder Discussed importance of daily weight and advised patient to weigh and record daily Reviewed role of diuretics in prevention of fluid overload and management of heart failure Discussed the importance of keeping all appointments with provider Provided patient with education about the role of exercise in the management of heart failure Advised patient to discuss changes in heart failure or heart health with provider Screening for signs and symptoms of depression related to chronic disease state  Assessed social determinant of health barriers In basket message sent to the pcp and pharm D. The patient does not have enough Entresto to  finish out the month of December before needing a refill. The patient called the [employer] company and then called the RNCM back and said they need a new script for [employer]. A message has been sent to ask for assistance with a new script to be sent to [employer] and it can be sent by e-scripts. Education provided to the patient. The patient was also going to touch base with the pharm D for assistance. Incoming call from the patient asking for assistance with paperwork for [facility] The patient has the paperwork for financial assistance for his Entresto. He said it has to be filled out and faxed before the deadline tomorrow. Education on the patient filling out his part and taking to the office and giving to office staff. An secure message has been sent to [facility], NP asking for assistance with the patient request.  Will let office staff know that the patient is bringing the paperwork to the office. Will send an in basket message to the pharm D and pcp.   Symptom Management: Take medications as prescribed   Attend all scheduled provider appointments Call pharmacy for medication refills 3-7 days in advance of running out of medications Call provider office for new concerns or questions  call the Suicide and Crisis Lifeline: 988 call the [place] National Suicide Prevention Lifeline: 442-721-2427 or TTY: 445-858-4887 TTY (706)713-2113) to talk to a trained counselor call 1-800-273-TALK (toll free, 24 hour hotline) if experiencing a Mental Health or [place]  call office if I gain more than 2 pounds in one day or 5 pounds in one week use salt in moderation watch for swelling in feet, ankles  and legs every day weigh myself daily develop a rescue plan follow rescue plan if symptoms flare-up track symptoms and what helps feel better or worse dress right for the weather, hot or cold  Follow Up Plan: Telephone follow up appointment with care management team member scheduled for: 05-28-2022 at  0900 am       CCM Expected Outcome:  Monitor, Self-Manage, and Reduce Symptoms of Hypertension       Current Barriers:  Knowledge Deficits related to the importance of monitoring blood pressures on a regular basis to be proactive in monitoring for changes and the increased risk of heart attack and stroke with blood pressures out of range. Chronic Disease Management support and education needs related to effective management of HTN  BP Readings from Last 3 Encounters:  02/09/22 128/78  01/25/22 136/80  10/23/21 118/74    Planned Interventions: Evaluation of current treatment plan related to hypertension self management and patient's adherence to plan as established by provider;   Provided education to patient re: stroke prevention, s/s of heart attack and stroke; Reviewed prescribed diet heart healthy/ADA diet  Reviewed medications with patient and discussed importance of compliance. The patient is compliant with medications. Incoming call today from the patient asking for assistance with paperwork and getting it faxed to get assistance for his Entresto. In basket message and secure chat message sent to the pcp. Will continue to monitor. ;  Discussed plans with patient for ongoing care management follow up and provided patient with direct contact information for care management team; Advised patient, providing education and rationale, to monitor blood pressure daily and record, calling PCP for findings outside established parameters;  Reviewed scheduled/upcoming provider appointments including: 05-11-2022 at [place] am Advised patient to discuss changes in blood pressures or questions/concerns about heart health with provider; Provided education on prescribed diet heart healthy/ADA diet ;  Discussed complications of poorly controlled blood pressure such as heart disease, stroke, circulatory complications, vision complications, kidney impairment, sexual dysfunction;  Screening for signs and symptoms  of depression related to chronic disease state;  Assessed social determinant of health barriers;   Symptom Management: Take medications as prescribed   Attend all scheduled provider appointments Call provider office for new concerns or questions  call the Suicide and Crisis Lifeline: 988 call the [place] National Suicide Prevention Lifeline: 434-180-3401 or TTY: 520-385-4522 TTY 913-132-7356) to talk to a trained counselor call 1-800-273-TALK (toll free, 24 hour hotline) if experiencing a Mental Health or [place]  check blood pressure weekly learn about high blood pressure keep a blood pressure log take blood pressure log to all doctor appointments call doctor for signs and symptoms of high blood pressure develop an action plan for high blood pressure keep all doctor appointments take medications for blood pressure exactly as prescribed report new symptoms to your doctor  Follow Up Plan: Telephone follow up appointment with care management team member scheduled for: 05-28-2022 at 0900 am          Patient verbalizes understanding of instructions and care plan provided today and agrees to view in [place]. Active MyChart status and patient understanding of how to access instructions and care plan via MyChart confirmed with patient.     Telephone follow up appointment with care management team member scheduled for: 05-28-2022 at 0900 am

## 2022-04-22 NOTE — Chronic Care Management (AMB) (Signed)
Chronic Care Management   CCM RN Visit Note  04/22/2022 Name: Ryan [name] [name]: [id number] DOB: [birth year]  Subjective: Ryan [name] is a 71 y.o. year old male who is a primary care patient of Cannady, Ryan Faster, NP. The patient was referred to the Chronic Care Management team for assistance with care management needs subsequent to provider initiation of CCM services and plan of care.    Today's Visit:  Engaged with patient by telephone for follow up visit.        Goals Addressed             This Visit's Progress    CCM Expected Outcome:  Monitor, Self-Manage and Reduce Symptoms of Heart Failure       Current Barriers:  Knowledge Deficits related to the importance of daily weights in the effective management of HF Chronic Disease Management support and education needs related to effective management of HF Financial Constraints.  Difficulty obtaining medications  Planned Interventions: Basic overview and discussion of pathophysiology of Heart Failure reviewed Provided education on low sodium diet Reviewed Heart Failure Action Plan in depth and provided written copy Assessed need for readable accurate scales in home Provided education about placing scale on hard, flat surface Advised patient to weigh each morning after emptying bladder Discussed importance of daily weight and advised patient to weigh and record daily Reviewed role of diuretics in prevention of fluid overload and management of heart failure Discussed the importance of keeping all appointments with provider Provided patient with education about the role of exercise in the management of heart failure Advised patient to discuss changes in heart failure or heart health with provider Screening for signs and symptoms of depression related to chronic disease state  Assessed social determinant of health barriers In basket message sent to the pcp and pharm D. The patient does not have enough Entresto to finish out the  month of December before needing a refill. The patient called the [employer] company and then called the RNCM back and said they need a new script for [employer]. A message has been sent to ask for assistance with a new script to be sent to [employer] and it can be sent by e-scripts. Education provided to the patient. The patient was also going to touch base with the pharm D for assistance. Incoming call from the patient asking for assistance with paperwork for [facility]. The patient has the paperwork for financial assistance for his Entresto. He said it has to be filled out and faxed before the deadline tomorrow. Education on the patient filling out his part and taking to the office and giving to office staff. An secure message has been sent to [facility], NP asking for assistance with the patient request.  Will let office staff know that the patient is bringing the paperwork to the office. Will send an in basket message to the pharm D and pcp.   Symptom Management: Take medications as prescribed   Attend all scheduled provider appointments Call pharmacy for medication refills 3-7 days in advance of running out of medications Call provider office for new concerns or questions  call the Suicide and Crisis Lifeline: 988 call the [place] National Suicide Prevention Lifeline: 475-090-5761 or TTY: 978 825 8616 TTY 2295720219) to talk to a trained counselor call 1-800-273-TALK (toll free, 24 hour hotline) if experiencing a Mental Health or [place]  call office if I gain more than 2 pounds in one day or 5 pounds in one week use salt in  moderation watch for swelling in feet, ankles and legs every day weigh myself daily develop a rescue plan follow rescue plan if symptoms flare-up track symptoms and what helps feel better or worse dress right for the weather, hot or cold  Follow Up Plan: Telephone follow up appointment with care management team member scheduled for: 05-28-2022 at 0900 am        CCM Expected Outcome:  Monitor, Self-Manage, and Reduce Symptoms of Hypertension       Current Barriers:  Knowledge Deficits related to the importance of monitoring blood pressures on a regular basis to be proactive in monitoring for changes and the increased risk of heart attack and stroke with blood pressures out of range. Chronic Disease Management support and education needs related to effective management of HTN  BP Readings from Last 3 Encounters:  02/09/22 128/78  01/25/22 136/80  10/23/21 118/74    Planned Interventions: Evaluation of current treatment plan related to hypertension self management and patient's adherence to plan as established by provider;   Provided education to patient re: stroke prevention, s/s of heart attack and stroke; Reviewed prescribed diet heart healthy/ADA diet  Reviewed medications with patient and discussed importance of compliance. The patient is compliant with medications. Incoming call today from the patient asking for assistance with paperwork and getting it faxed to get assistance for his Entresto. In basket message and secure chat message sent to the pcp. Will continue to monitor. ;  Discussed plans with patient for ongoing care management follow up and provided patient with direct contact information for care management team; Advised patient, providing education and rationale, to monitor blood pressure daily and record, calling PCP for findings outside established parameters;  Reviewed scheduled/upcoming provider appointments including: 05-11-2022 at [place] am Advised patient to discuss changes in blood pressures or questions/concerns about heart health with provider; Provided education on prescribed diet heart healthy/ADA diet ;  Discussed complications of poorly controlled blood pressure such as heart disease, stroke, circulatory complications, vision complications, kidney impairment, sexual dysfunction;  Screening for signs and symptoms of depression  related to chronic disease state;  Assessed social determinant of health barriers;   Symptom Management: Take medications as prescribed   Attend all scheduled provider appointments Call provider office for new concerns or questions  call the Suicide and Crisis Lifeline: 988 call the [place] National Suicide Prevention Lifeline: 412-565-2591 or TTY: 506-739-2818 TTY 431-637-6918) to talk to a trained counselor call 1-800-273-TALK (toll free, 24 hour hotline) if experiencing a Mental Health or [place]  check blood pressure weekly learn about high blood pressure keep a blood pressure log take blood pressure log to all doctor appointments call doctor for signs and symptoms of high blood pressure develop an action plan for high blood pressure keep all doctor appointments take medications for blood pressure exactly as prescribed report new symptoms to your doctor  Follow Up Plan: Telephone follow up appointment with care management team member scheduled for: 05-28-2022 at 0900 am          Plan:Telephone follow up appointment with care management team member scheduled for:  05-28-2022 at 0900 am  [place], MSN, CCM RN Care Manager  Chronic Care Management Direct Number: [phone]

## 2022-04-22 NOTE — Telephone Encounter (Signed)
Patient brought in [employer] Patient Assistance Application and financial paperwork  to be completed by provider. Application was placed in the providers folder. Patient stated that deadline is 12/15/20323 and apologies for bringing it in late.

## 2022-04-23 ENCOUNTER — Other Ambulatory Visit: Payer: Self-pay | Admitting: Nurse Practitioner

## 2022-04-23 ENCOUNTER — Telehealth: Payer: Self-pay

## 2022-04-23 MED ORDER — ENTRESTO 24-26 MG PO TABS: 1.0000 | ORAL_TABLET | Freq: Two times a day (BID) | ORAL | 1 refills | Status: DC

## 2022-04-23 NOTE — Telephone Encounter (Signed)
Paperwork placed in providers folder for signature

## 2022-04-23 NOTE — Telephone Encounter (Signed)
Paperwork faxed back to [employer]

## 2022-04-23 NOTE — Progress Notes (Signed)
    Chronic Care Management Pharmacy Assistant   Name: Ryan [name]  [name]: [id number] DOB: 02-Jun-1950   Spoke with Mr. Pokorski this afternoon to get BS readings from last week and this week. All readings were taken in the morning before breakfast.  12/8-191 12/9-177 12/10-171 12/11-150 12/12-142 12/13-168 12/14-150 12/15-144  Medications: Outpatient Encounter Medications as of 04/23/2022  Medication Sig   carvedilol (COREG) 12.5 MG tablet Take 1 tablet (12.5 mg total) by mouth 2 (two) times daily.   Cholecalciferol (VITAMIN D3 SUPER STRENGTH) 50 MCG (2000 UT) CAPS Take 2,000 Units by mouth daily.   dapagliflozin propanediol (FARXIGA) 10 MG TABS tablet Take 10 mg by mouth daily.   dexlansoprazole (DEXILANT) 60 MG capsule Take 1 capsule (60 mg total) by mouth daily.   febuxostat (ULORIC) 40 MG tablet Take 1 tablet (40 mg total) by mouth daily.   levothyroxine (SYNTHROID) 50 MCG tablet Take 1 tablet (50 mcg total) by mouth daily.   sacubitril-valsartan (ENTRESTO) 24-26 MG Take 1 tablet by mouth 2 (two) times daily.   sitaGLIPtin (JANUVIA) 100 MG tablet Take 1 tablet (100 mg total) by mouth daily.   spironolactone (ALDACTONE) 25 MG tablet Take 1 tablet (25 mg total) by mouth daily.   No facility-administered encounter medications on file as of 04/23/2022.   [place] [phone]

## 2022-04-26 ENCOUNTER — Ambulatory Visit (INDEPENDENT_AMBULATORY_CARE_PROVIDER_SITE_OTHER): Payer: Medicare Other | Admitting: Dermatology

## 2022-04-26 ENCOUNTER — Telehealth: Payer: Self-pay

## 2022-04-26 VITALS — BP 153/93 | HR 74

## 2022-04-26 DIAGNOSIS — L820 Inflamed seborrheic keratosis: Secondary | ICD-10-CM

## 2022-04-26 DIAGNOSIS — L578 Other skin changes due to chronic exposure to nonionizing radiation: Secondary | ICD-10-CM

## 2022-04-26 DIAGNOSIS — L821 Other seborrheic keratosis: Secondary | ICD-10-CM

## 2022-04-26 DIAGNOSIS — Z1283 Encounter for screening for malignant neoplasm of skin: Secondary | ICD-10-CM | POA: Diagnosis not present

## 2022-04-26 DIAGNOSIS — L814 Other melanin hyperpigmentation: Secondary | ICD-10-CM

## 2022-04-26 DIAGNOSIS — D229 Melanocytic nevi, unspecified: Secondary | ICD-10-CM | POA: Diagnosis not present

## 2022-04-26 NOTE — Patient Instructions (Addendum)
Seborrheic Keratosis  What causes seborrheic keratoses? Seborrheic keratoses are harmless, common skin growths that first appear during adult life.  As time goes by, more growths appear.  Some people may develop a large number of them.  Seborrheic keratoses appear on both covered and uncovered body parts.  They are not caused by sunlight.  The tendency to develop seborrheic keratoses can be inherited.  They vary in color from skin-colored to gray, brown, or even black.  They can be either smooth or have a rough, warty surface.   Seborrheic keratoses are superficial and look as if they were stuck on the skin.  Under the microscope this type of keratosis looks like layers upon layers of skin.  That is why at times the top layer may seem to fall off, but the rest of the growth remains and re-grows.    Treatment Seborrheic keratoses do not need to be treated, but can easily be removed in the office.  Seborrheic keratoses often cause symptoms when they rub on clothing or jewelry.  Lesions can be in the way of shaving.  If they become inflamed, they can cause itching, soreness, or burning.  Removal of a seborrheic keratosis can be accomplished by freezing, burning, or surgery. If any spot bleeds, scabs, or grows rapidly, please return to have it checked, as these can be an indication of a skin cancer.  Cryotherapy Aftercare  Wash gently with soap and water everyday.   Apply Vaseline and Band-Aid daily until healed.       Melanoma ABCDEs  Melanoma is the most dangerous type of skin cancer, and is the leading cause of death from skin disease.  You are more likely to develop melanoma if you: Have light-colored skin, light-colored eyes, or red or blond hair Spend a lot of time in the sun Tan regularly, either outdoors or in a tanning bed Have had blistering sunburns, especially during childhood Have a close family member who has had a melanoma Have atypical moles or large birthmarks  Early  detection of melanoma is key since treatment is typically straightforward and cure rates are extremely high if we catch it early.   The first sign of melanoma is often a change in a mole or a new dark spot.  The ABCDE system is a way of remembering the signs of melanoma.  A for asymmetry:  The two halves do not match. B for border:  The edges of the growth are irregular. C for color:  A mixture of colors are present instead of an even brown color. D for diameter:  Melanomas are usually (but not always) greater than 6mm - the size of a pencil eraser. E for evolution:  The spot keeps changing in size, shape, and color.  Please check your skin once per month between visits. You can use a small mirror in front and a large mirror behind you to keep an eye on the back side or your body.   If you see any new or changing lesions before your next follow-up, please call to schedule a visit.  Please continue daily skin protection including broad spectrum sunscreen SPF 30+ to sun-exposed areas, reapplying every 2 hours as needed when you're outdoors.   Staying in the shade or wearing long sleeves, sun glasses (UVA+UVB protection) and wide brim hats (4-inch brim around the entire circumference of the hat) are also recommended for sun protection.     Due to recent changes in healthcare laws, you may see results of   your pathology and/or laboratory studies on MyChart before the doctors have had a chance to review them. We understand that in some cases there may be results that are confusing or concerning to you. Please understand that not all results are received at the same time and often the doctors may need to interpret multiple results in order to provide you with the best plan of care or course of treatment. Therefore, we ask that you please give us 2 business days to thoroughly review all your results before contacting the office for clarification. Should we see a critical lab result, you will be contacted  sooner.   If You Need Anything After Your Visit  If you have any questions or concerns for your doctor, please call our main line at [phone] and press option 4 to reach your doctor's medical assistant. If no one answers, please leave a voicemail as directed and we will return your call as soon as possible. Messages left after 4 pm will be answered the following business day.   You may also send us a message via MyChart. We typically respond to MyChart messages within 1-2 business days.  For prescription refills, please ask your pharmacy to contact our office. Our fax number is [phone].  If you have an urgent issue when the clinic is closed that cannot wait until the next business day, you can page your doctor at the number below.    Please note that while we do our best to be available for urgent issues outside of office hours, we are not available 24/7.   If you have an urgent issue and are unable to reach us, you may choose to seek medical care at your doctor's office, retail clinic, urgent care center, or emergency room.  If you have a medical emergency, please immediately call 911 or go to the emergency department.  Pager Numbers  - Dr. Kowalski: [phone]  - Dr. Moye: [phone]  - Dr. Stewart: [phone]  In the event of inclement weather, please call our main line at [phone] for an update on the status of any delays or closures.  Dermatology Medication Tips: Please keep the boxes that topical medications come in in order to help keep track of the instructions about where and how to use these. Pharmacies typically print the medication instructions only on the boxes and not directly on the medication tubes.   If your medication is too expensive, please contact our office at [phone] option 4 or send us a message through MyChart.   We are unable to tell what your co-pay for medications will be in advance as this is different depending on your insurance  coverage. However, we may be able to find a substitute medication at lower cost or fill out paperwork to get insurance to cover a needed medication.   If a prior authorization is required to get your medication covered by your insurance company, please allow us 1-2 business days to complete this process.  Drug prices often vary depending on where the prescription is filled and some pharmacies may offer cheaper prices.  The website www.goodrx.com contains coupons for medications through different pharmacies. The prices here do not account for what the cost may be with help from insurance (it may be cheaper with your insurance), but the website can give you the price if you did not use any insurance.  - You can print the associated coupon and take it with your prescription to the pharmacy.  - You may also stop   by our office during regular business hours and pick up a GoodRx coupon card.  - If you need your prescription sent electronically to a different pharmacy, notify our office through [facility] MyChart or by phone at [phone] option 4.     Si Usted Necesita Algo Despus de Su Visita  Tambin puede enviarnos un mensaje a travs de MyChart. Por lo general respondemos a los mensajes de MyChart en el transcurso de 1 a 2 das hbiles.  Para renovar recetas, por favor pida a su farmacia que se ponga en contacto con nuestra oficina. Nuestro nmero de fax es el [phone].  Si tiene un asunto urgente cuando la clnica est cerrada y que no puede esperar hasta el siguiente da hbil, puede llamar/localizar a su doctor(a) al nmero que aparece a continuacin.   Por favor, tenga en cuenta que aunque hacemos todo lo posible para estar disponibles para asuntos urgentes fuera del horario de oficina, no estamos disponibles las 24 horas del da, los 7 das de la semana.   Si tiene un problema urgente y no puede comunicarse con nosotros, puede optar por buscar atencin mdica  en el consultorio de  su doctor(a), en una clnica privada, en un centro de atencin urgente o en una sala de emergencias.  Si tiene una emergencia mdica, por favor llame inmediatamente al 911 o vaya a la sala de emergencias.  Nmeros de bper  - Dr. Kowalski: [phone]  - Dra. Moye: [phone]  - Dra. Stewart: [phone]  En caso de inclemencias del tiempo, por favor llame a nuestra lnea principal al [phone] para una actualizacin sobre el estado de cualquier retraso o cierre.  Consejos para la medicacin en dermatologa: Por favor, guarde las cajas en las que vienen los medicamentos de uso tpico para ayudarle a seguir las instrucciones sobre dnde y cmo usarlos. Las farmacias generalmente imprimen las instrucciones del medicamento slo en las cajas y no directamente en los tubos del medicamento.   Si su medicamento es muy caro, por favor, pngase en contacto con nuestra oficina llamando al [phone] y presione la opcin 4 o envenos un mensaje a travs de MyChart.   No podemos decirle cul ser su copago por los medicamentos por adelantado ya que esto es diferente dependiendo de la cobertura de su seguro. Sin embargo, es posible que podamos encontrar un medicamento sustituto a menor costo o llenar un formulario para que el seguro cubra el medicamento que se considera necesario.   Si se requiere una autorizacin previa para que su compaa de seguros cubra su medicamento, por favor permtanos de 1 a 2 das hbiles para completar este proceso.  Los precios de los medicamentos varan con frecuencia dependiendo del lugar de dnde se surte la receta y alguna farmacias pueden ofrecer precios ms baratos.  El sitio web www.goodrx.com tiene cupones para medicamentos de diferentes farmacias. Los precios aqu no tienen en cuenta lo que podra costar con la ayuda del seguro (puede ser ms barato con su seguro), pero el sitio web puede darle el precio si no utiliz ningn seguro.  - Puede imprimir el  cupn correspondiente y llevarlo con su receta a la farmacia.  - Tambin puede pasar por nuestra oficina durante el horario de atencin regular y recoger una tarjeta de cupones de GoodRx.  - Si necesita que su receta se enve electrnicamente a una farmacia diferente, informe a nuestra oficina a travs de MyChart de [facility] o por telfono llamando al [phone] y presione la opcin 4.  

## 2022-04-26 NOTE — Progress Notes (Signed)
   Follow-Up Visit   Subjective  Ryan [name] is a 71 y.o. male who presents for the following: Annual Exam (1 year tbse hx of isk ). The patient presents for Total-Body Skin Exam (TBSE) for skin cancer screening and mole check.  The patient has spots, moles and lesions to be evaluated, some may be new or changing and the patient has concerns that these could be cancer.  The following portions of the chart were reviewed this encounter and updated as appropriate:  Tobacco  Allergies  Meds  Problems  Med Hx  Surg Hx  Fam Hx     Review of Systems: No other skin or systemic complaints except as noted in HPI or Assessment and Plan.  Objective  Well appearing patient in no apparent distress; mood and affect are within normal limits.  A full examination was performed including scalp, head, eyes, ears, nose, lips, neck, chest, axillae, abdomen, back, buttocks, bilateral upper extremities, bilateral lower extremities, hands, feet, fingers, toes, fingernails, and toenails. All findings within normal limits unless otherwise noted below.  right forearm x 2 , back x 16 (18) Erythematous stuck-on, waxy papule or plaque   Assessment & Plan  Inflamed seborrheic keratosis (18) right forearm x 2 , back x 16  Symptomatic, irritating, patient would like treated.  Destruction of lesion - right forearm x 2 , back x 16 Complexity: simple   Destruction method: cryotherapy   Informed consent: discussed and consent obtained   Timeout:  patient name, date of birth, surgical site, and procedure verified Lesion destroyed using liquid nitrogen: Yes   Region frozen until ice ball extended beyond lesion: Yes   Outcome: patient tolerated procedure well with no complications   Post-procedure details: wound care instructions given   Additional details:  Prior to procedure, discussed risks of blister formation, small wound, skin dyspigmentation, or rare scar following cryotherapy. Recommend Vaseline ointment  to treated areas while healing.   Lentigines - Scattered tan macules - Due to sun exposure - Benign-appearing, observe - Recommend daily broad spectrum sunscreen SPF 30+ to sun-exposed areas, reapply every 2 hours as needed. - Call for any changes  Seborrheic Keratoses - Stuck-on, waxy, tan-brown papules and/or plaques  - Benign-appearing - Discussed benign etiology and prognosis. - Observe - Call for any changes  Melanocytic Nevi - Tan-brown and/or pink-flesh-colored symmetric macules and papules - Benign appearing on exam today - Observation - Call clinic for new or changing moles - Recommend daily use of broad spectrum spf 30+ sunscreen to sun-exposed areas.   Hemangiomas - Red papules - Discussed benign nature - Observe - Call for any changes  Actinic Damage - Chronic condition, secondary to cumulative UV/sun exposure - diffuse scaly erythematous macules with underlying dyspigmentation - Recommend daily broad spectrum sunscreen SPF 30+ to sun-exposed areas, reapply every 2 hours as needed.  - Staying in the shade or wearing long sleeves, sun glasses (UVA+UVB protection) and wide brim hats (4-inch brim around the entire circumference of the hat) are also recommended for sun protection.  - Call for new or changing lesions.  Skin cancer screening performed today. Return in about 1 year (around 04/27/2023) for TBSE. IRuthell Rummage, CMA, am acting as scribe for Sarina Ser, MD. Documentation: I have reviewed the above documentation for accuracy and completeness, and I agree with the above.  Sarina Ser, MD

## 2022-04-26 NOTE — Telephone Encounter (Signed)
   CCM RN Visit Note   04-26-2022 Name: JAAZIEL PEATROSS [name]: [id number]      DOB: 19-Sep-1950  Subjective: DAEMIEN FRONCZAK is a 71 y.o. year old male who is a primary care patient of Cannady, Barbaraann Faster, NP. The patient was referred to the Chronic Care Management team for assistance with care management needs subsequent to provider initiation of CCM services and plan of care.      Today's Visit:  Patient left a voicemail asking for a call back  for  information on the [employer] program .     Incoming call from the patient asking for a call back concerning questions he has for the [employer] program. Medication assistance. Will let the pcp and pharm D know so that the pharm D can follow up with the patient.    Plan:The care management team will reach out to the patient again over the next 30 days.  Noreene Larsson RN, MSN, CCM RN Care Manager  Chronic Care Management Direct Number: [phone]

## 2022-04-28 ENCOUNTER — Ambulatory Visit (INDEPENDENT_AMBULATORY_CARE_PROVIDER_SITE_OTHER): Payer: Medicare Other

## 2022-04-28 DIAGNOSIS — I428 Other cardiomyopathies: Secondary | ICD-10-CM

## 2022-04-28 LAB — CUP PACEART REMOTE DEVICE CHECK
Battery Remaining Longevity: 21 mo
Battery Voltage: 2.92 V
Brady Statistic AP VP Percent: 5.69 %
Brady Statistic AP VS Percent: 0.01 %
Brady Statistic AS VP Percent: 94.3 %
Brady Statistic AS VS Percent: 0 %
Brady Statistic RA Percent Paced: 5.64 %
Brady Statistic RV Percent Paced: 99.98 %
Date Time Interrogation Session: 20231220001703
HighPow Impedance: 55 Ohm
HighPow Impedance: 77 Ohm
Implantable Lead Connection Status: 753985
Implantable Lead Connection Status: 753985
Implantable Lead Connection Status: 753985
Implantable Lead Implant Date: 20060505
Implantable Lead Implant Date: 20120111
Implantable Lead Implant Date: 20180314
Implantable Lead Location: 753858
Implantable Lead Location: 753859
Implantable Lead Location: 753860
Implantable Lead Model: 5076
Implantable Lead Model: 6947
Implantable Pulse Generator Implant Date: 20180314
Lead Channel Impedance Value: 241.412
Lead Channel Impedance Value: 246.635
Lead Channel Impedance Value: 249.509
Lead Channel Impedance Value: 255.093
Lead Channel Impedance Value: 265.661
Lead Channel Impedance Value: 418 Ohm
Lead Channel Impedance Value: 456 Ohm
Lead Channel Impedance Value: 475 Ohm
Lead Channel Impedance Value: 513 Ohm
Lead Channel Impedance Value: 551 Ohm
Lead Channel Impedance Value: 589 Ohm
Lead Channel Impedance Value: 665 Ohm
Lead Channel Impedance Value: 760 Ohm
Lead Channel Impedance Value: 779 Ohm
Lead Channel Impedance Value: 779 Ohm
Lead Channel Impedance Value: 836 Ohm
Lead Channel Impedance Value: 874 Ohm
Lead Channel Impedance Value: 874 Ohm
Lead Channel Pacing Threshold Amplitude: 0.625 V
Lead Channel Pacing Threshold Amplitude: 0.625 V
Lead Channel Pacing Threshold Amplitude: 0.875 V
Lead Channel Pacing Threshold Pulse Width: 0.4 ms
Lead Channel Pacing Threshold Pulse Width: 0.4 ms
Lead Channel Pacing Threshold Pulse Width: 0.4 ms
Lead Channel Sensing Intrinsic Amplitude: 1.125 mV
Lead Channel Sensing Intrinsic Amplitude: 1.125 mV
Lead Channel Setting Pacing Amplitude: 1.25 V
Lead Channel Setting Pacing Amplitude: 1.5 V
Lead Channel Setting Pacing Amplitude: 2 V
Lead Channel Setting Pacing Pulse Width: 0.4 ms
Lead Channel Setting Pacing Pulse Width: 0.4 ms
Lead Channel Setting Sensing Sensitivity: 0.3 mV
Zone Setting Status: 755011
Zone Setting Status: 755011

## 2022-05-07 ENCOUNTER — Encounter: Payer: Self-pay | Admitting: Dermatology

## 2022-05-09 DIAGNOSIS — E1159 Type 2 diabetes mellitus with other circulatory complications: Secondary | ICD-10-CM | POA: Diagnosis not present

## 2022-05-09 DIAGNOSIS — I5020 Unspecified systolic (congestive) heart failure: Secondary | ICD-10-CM | POA: Diagnosis not present

## 2022-05-09 DIAGNOSIS — I110 Hypertensive heart disease with heart failure: Secondary | ICD-10-CM | POA: Diagnosis not present

## 2022-05-09 NOTE — Patient Instructions (Signed)
Diabetes Mellitus Basics  Diabetes mellitus, or diabetes, is a long-term (chronic) disease. It occurs when the body does not properly use sugar (glucose) that is released from food after you eat. Diabetes mellitus may be caused by one or both of these problems: Your pancreas does not make enough of a hormone called insulin. Your body does not react in a normal way to the insulin that it makes. Insulin lets glucose enter cells in your body. This gives you energy. If you have diabetes, glucose cannot get into cells. This causes high blood glucose (hyperglycemia). How to treat and manage diabetes You may need to take insulin or other diabetes medicines daily to keep your glucose in balance. If you are prescribed insulin, you will learn how to give yourself insulin by injection. You may need to adjust the amount of insulin you take based on the foods that you eat. You will need to check your blood glucose levels using a glucose monitor as told by your health care provider. The readings can help determine if you have low or high blood glucose. Generally, you should have these blood glucose levels: Before meals (preprandial): 80-130 mg/dL (4.4-7.2 mmol/L). After meals (postprandial): below 180 mg/dL (10 mmol/L). Hemoglobin A1c (HbA1c) level: less than 7%. Your health care provider will set treatment goals for you. Keep all follow-up visits. This is important. Follow these instructions at home: Diabetes medicines Take your diabetes medicines every day as told by your health care provider. List your diabetes medicines here: Name of medicine: ______________________________ Amount (dose): _______________ Time (a.m./p.m.): _______________ Notes: ___________________________________ Name of medicine: ______________________________ Amount (dose): _______________ Time (a.m./p.m.): _______________ Notes: ___________________________________ Name of medicine: ______________________________ Amount (dose):  _______________ Time (a.m./p.m.): _______________ Notes: ___________________________________ Insulin If you use insulin, list the types of insulin you use here: Insulin type: ______________________________ Amount (dose): _______________ Time (a.m./p.m.): _______________Notes: ___________________________________ Insulin type: ______________________________ Amount (dose): _______________ Time (a.m./p.m.): _______________ Notes: ___________________________________ Insulin type: ______________________________ Amount (dose): _______________ Time (a.m./p.m.): _______________ Notes: ___________________________________ Insulin type: ______________________________ Amount (dose): _______________ Time (a.m./p.m.): _______________ Notes: ___________________________________ Insulin type: ______________________________ Amount (dose): _______________ Time (a.m./p.m.): _______________ Notes: ___________________________________ Managing blood glucose  Check your blood glucose levels using a glucose monitor as told by your health care provider. Write down the times that you check your glucose levels here: Time: _______________ Notes: ___________________________________ Time: _______________ Notes: ___________________________________ Time: _______________ Notes: ___________________________________ Time: _______________ Notes: ___________________________________ Time: _______________ Notes: ___________________________________ Time: _______________ Notes: ___________________________________  Low blood glucose Low blood glucose (hypoglycemia) is when glucose is at or below 70 mg/dL (3.9 mmol/L). Symptoms may include: Feeling: Hungry. Sweaty and clammy. Irritable or easily upset. Dizzy. Sleepy. Having: A fast heartbeat. A headache. A change in your vision. Numbness around the mouth, lips, or tongue. Having trouble with: Moving (coordination). Sleeping. Treating low blood glucose To treat low blood  glucose, eat or drink something containing sugar right away. If you can think clearly and swallow safely, follow the 15:15 rule: Take 15 grams of a fast-acting carb (carbohydrate), as told by your health care provider. Some fast-acting carbs are: Glucose tablets: take 3-4 tablets. Hard candy: eat 3-5 pieces. Fruit juice: drink 4 oz (120 mL). Regular (not diet) soda: drink 4-6 oz (120-180 mL). Honey or sugar: eat 1 Tbsp (15 mL). Check your blood glucose levels 15 minutes after you take the carb. If your glucose is still at or below 70 mg/dL (3.9 mmol/L), take 15 grams of a carb again. If your glucose does not go above 70 mg/dL (3.9 mmol/L) after   3 tries, get help right away. After your glucose goes back to normal, eat a meal or a snack within 1 hour. Treating very low blood glucose If your glucose is at or below 54 mg/dL (3 mmol/L), you have very low blood glucose (severe hypoglycemia). This is an emergency. Do not wait to see if the symptoms will go away. Get medical help right away. Call your local emergency services (911 in the [place] Do not drive yourself to the hospital. Questions to ask your health care provider Should I talk with a diabetes educator? What equipment will I need to care for myself at home? What diabetes medicines do I need? When should I take them? How often do I need to check my blood glucose levels? What number can I call if I have questions? When is my follow-up visit? Where can I find a support group for people with diabetes? Where to find more information American Diabetes Association: www.diabetes.org Association of Diabetes Care and Education Specialists: www.diabeteseducator.org Contact a health care provider if: Your blood glucose is at or above 240 mg/dL (13.3 mmol/L) for 2 days in a row. You have been sick or have had a fever for 2 days or more, and you are not getting better. You have any of these problems for more than 6 hours: You cannot eat or  drink. You feel nauseous. You vomit. You have diarrhea. Get help right away if: Your blood glucose is lower than 54 mg/dL (3 mmol/L). You get confused. You have trouble thinking clearly. You have trouble breathing. These symptoms may represent a serious problem that is an emergency. Do not wait to see if the symptoms will go away. Get medical help right away. Call your local emergency services (911 in the [place] Do not drive yourself to the hospital. Summary Diabetes mellitus is a chronic disease that occurs when the body does not properly use sugar (glucose) that is released from food after you eat. Take insulin and diabetes medicines as told. Check your blood glucose every day, as often as told. Keep all follow-up visits. This is important. This information is not intended to replace advice given to you by your health care provider. Make sure you discuss any questions you have with your health care provider. Document Revised: 08/28/2019 Document Reviewed: 08/28/2019 Elsevier Patient Education  2023 [employer] Inc.  

## 2022-05-11 ENCOUNTER — Encounter: Payer: Self-pay | Admitting: Nurse Practitioner

## 2022-05-11 ENCOUNTER — Ambulatory Visit (INDEPENDENT_AMBULATORY_CARE_PROVIDER_SITE_OTHER): Payer: Medicare Other | Admitting: Nurse Practitioner

## 2022-05-11 VITALS — BP 122/62 | HR 70 | Temp 97.7°F | Ht 67.99 in | Wt 201.5 lb

## 2022-05-11 DIAGNOSIS — E785 Hyperlipidemia, unspecified: Secondary | ICD-10-CM

## 2022-05-11 DIAGNOSIS — G720 Drug-induced myopathy: Secondary | ICD-10-CM | POA: Diagnosis not present

## 2022-05-11 DIAGNOSIS — E1169 Type 2 diabetes mellitus with other specified complication: Secondary | ICD-10-CM

## 2022-05-11 DIAGNOSIS — I429 Cardiomyopathy, unspecified: Secondary | ICD-10-CM | POA: Diagnosis not present

## 2022-05-11 DIAGNOSIS — N183 Chronic kidney disease, stage 3 unspecified: Secondary | ICD-10-CM | POA: Diagnosis not present

## 2022-05-11 DIAGNOSIS — I5022 Chronic systolic (congestive) heart failure: Secondary | ICD-10-CM

## 2022-05-11 DIAGNOSIS — E1122 Type 2 diabetes mellitus with diabetic chronic kidney disease: Secondary | ICD-10-CM

## 2022-05-11 DIAGNOSIS — E1159 Type 2 diabetes mellitus with other circulatory complications: Secondary | ICD-10-CM | POA: Diagnosis not present

## 2022-05-11 DIAGNOSIS — I152 Hypertension secondary to endocrine disorders: Secondary | ICD-10-CM

## 2022-05-11 DIAGNOSIS — Z95810 Presence of automatic (implantable) cardiac defibrillator: Secondary | ICD-10-CM | POA: Diagnosis not present

## 2022-05-11 DIAGNOSIS — E039 Hypothyroidism, unspecified: Secondary | ICD-10-CM | POA: Diagnosis not present

## 2022-05-11 LAB — MICROALBUMIN, URINE WAIVED
Creatinine, Urine Waived: 10 mg/dL (ref 10–300)
Microalb, Ur Waived: 10 mg/L (ref 0–19)

## 2022-05-11 LAB — BAYER DCA HB A1C WAIVED: HB A1C (BAYER DCA - WAIVED): 7.5 % — ABNORMAL HIGH (ref 4.8–5.6)

## 2022-05-11 NOTE — Assessment & Plan Note (Signed)
Chronic, ongoing.  Continue current medication regimen and adjust as needed based on labs.  TSH + Free T4 up to date.

## 2022-05-11 NOTE — Assessment & Plan Note (Signed)
Continue collaboration with cardiology and regular checks.

## 2022-05-11 NOTE — Assessment & Plan Note (Signed)
Due to statin therapy, history of on low doses.  Would benefit from statin use with diabetes and heart health or consider injectable, discussed with him today.

## 2022-05-11 NOTE — Assessment & Plan Note (Signed)
Chronic, ongoing.  No current medication due to poor tolerance.   Would benefit from statin use with diabetes and heart health or consider injectable, discussed with him today.  Lipid panel up to date.  He is agreeable to trial of Rosuvastatin on 3 day or 1 day a week schedule if LDL >70 in future if needed.

## 2022-05-11 NOTE — Assessment & Plan Note (Signed)
Ongoing, 3a, improved last visit.  Recheck labs next visit. Urine ALB 19 May 2022 on check.  Consider referral to nephrology if worsening.

## 2022-05-11 NOTE — Assessment & Plan Note (Signed)
Chronic, stable.  Euvolemic today.  Continue current medication regimen and collaboration with cardiology.  LABS: up to date.   Recommend: - Reminded to call for an overnight weight gain of >2 pounds or a weekly weight weight of >5 pounds - not adding salt to his food and has been reading food labels. Reviewed the importance of keeping daily sodium intake to '2000mg'$  daily  - Avoid NSAIDS

## 2022-05-11 NOTE — Progress Notes (Signed)
BP 122/62   Pulse 70   Temp 97.7 F (36.5 C) (Oral)   Ht 5' 7.99" (1.727 m)   Wt 201 lb 8 oz (91.4 kg)   SpO2 98%   BMI 30.65 kg/m    Subjective:    Patient ID: Ryan [name], male    DOB: 1951-03-06, 72 y.o.   [name]: [id number]  HPI: Ryan [name] is a 72 y.o. male  Chief Complaint  Patient presents with   Diabetes   Hypertension   Hyperlipidemia   Chronic Kidney Disease   DIABETES Last A1c 7% in September. Takes Wilder Glade (gets via assistance) + Januvia 100 MG daily.  Had intolerance to Metformin and Rybelsus. Prefers no injectables.  Diet over the holidays has been bad, eating terrible.  Recently started walking again, about 20 minutes a day -- brisk walk.   Hypoglycemic episodes:no Polydipsia/polyuria: no Visual disturbance: no Chest pain: no Paresthesias: no Glucose Monitoring: no             Accucheck frequency: occasional             Fasting glucose: 142 to 177             Post prandial:             Evening:             Before meals: Taking Insulin?: no             Long acting insulin:             Short acting insulin: Blood Pressure Monitoring: daily Retinal Examination: Up to Date  Foot Exam: Up to Date Pneumovax: refuses Influenza:  refused Aspirin: no    HYPERTENSION / HYPERLIPIDEMIA/HF Followed by cardiology, Dr. Caryl Comes, last saw 02/09/22 -- they discussed triglycerides and cholesterol at visit, he recommended continue current diet focus.  Has cardio-defib implantable (replacement last in January 2012) last check 04/29/22.    Continues on Aldactone, Entresto (gets via assistance), Coreg.  No statin, reports cardiology took him off of this.  Pravastatin in past with poor response -- myalgia.   Satisfied with current treatment? yes Duration of hypertension: chronic BP monitoring frequency: a few times a week BP range: 110-120/80 range BP medication side effects: no Duration of hyperlipidemia: chronic Medication compliance: good compliance Aspirin:  no Recent stressors: no Recurrent headaches: no Visual changes: no Palpitations: no Dyspnea: no Chest pain: no Lower extremity edema: no Dizzy/lightheaded: no  The 10-year ASCVD risk score (Arnett DK, et al., 2019) is: 37.9%   Values used to calculate the score:     Age: 18 years     Sex: Male     Is Non-Hispanic African American: No     Diabetic: Yes     Tobacco smoker: No     Systolic Blood Pressure: 254 mmHg     Is BP treated: Yes     HDL Cholesterol: 32 mg/dL     Total Cholesterol: 153 mg/dL   CHRONIC KIDNEY DISEASE CKD status: stable Medications renally dose: yes Previous renal evaluation: no Pneumovax:   refuses Influenza Vaccine:   refuses    HYPOTHYROIDISM Continues on Levothyroxine 50 MCG daily.  Recent labs stable. Thyroid control status:stable Satisfied with current treatment? yes Medication side effects: no Medication compliance: good compliance Etiology of hypothyroidism: unknown Recent dose adjustment:no Fatigue: no Cold intolerance: no Heat intolerance: no Weight gain: no Weight loss: no Constipation: no Diarrhea/loose stools: no Palpitations: no Lower extremity edema: no Anxiety/depressed mood:  no   Relevant past medical, surgical, family and social history reviewed and updated as indicated. Interim medical history since our last visit reviewed. Allergies and medications reviewed and updated.  Review of Systems  Constitutional:  Negative for activity change, diaphoresis, fatigue and fever.  Respiratory:  Negative for cough, chest tightness, shortness of breath and wheezing.   Cardiovascular:  Negative for chest pain, palpitations and leg swelling.  Gastrointestinal: Negative.   Endocrine: Negative for polydipsia, polyphagia and polyuria.  Neurological: Negative.   Psychiatric/Behavioral: Negative.     Per HPI unless specifically indicated above     Objective:    BP 122/62   Pulse 70   Temp 97.7 F (36.5 C) (Oral)   Ht 5' 7.99" (1.727  m)   Wt 201 lb 8 oz (91.4 kg)   SpO2 98%   BMI 30.65 kg/m   Wt Readings from Last 3 Encounters:  05/11/22 201 lb 8 oz (91.4 kg)  02/09/22 196 lb 6.4 oz (89.1 kg)  01/25/22 199 lb 9.6 oz (90.5 kg)    Physical Exam Vitals and nursing note reviewed.  Constitutional:      General: He is awake. He is not in acute distress.    Appearance: He is well-developed and well-groomed. He is not ill-appearing.  HENT:     Head: Normocephalic and atraumatic.     Right Ear: Hearing normal. No drainage.     Left Ear: Hearing normal. No drainage.  Eyes:     General: Lids are normal.        Right eye: No discharge.        Left eye: No discharge.     Conjunctiva/sclera: Conjunctivae normal.     Pupils: Pupils are equal, round, and reactive to light.  Neck:     Vascular: No carotid bruit.  Cardiovascular:     Rate and Rhythm: Normal rate and regular rhythm.     Heart sounds: Normal heart sounds, S1 normal and S2 normal. No murmur heard.    No gallop.  Pulmonary:     Effort: Pulmonary effort is normal. No accessory muscle usage or respiratory distress.     Breath sounds: Normal breath sounds.  Abdominal:     General: Bowel sounds are normal.     Palpations: Abdomen is soft.  Musculoskeletal:        General: Normal range of motion.     Cervical back: Normal range of motion and neck supple.     Right lower leg: No edema.     Left lower leg: No edema.  Skin:    General: Skin is warm and dry.     Capillary Refill: Capillary refill takes less than 2 seconds.  Neurological:     Mental Status: He is alert and oriented to person, place, and time.  Psychiatric:        Attention and Perception: Attention normal.        Mood and Affect: Mood normal.        Speech: Speech normal.        Behavior: Behavior normal. Behavior is cooperative.        Thought Content: Thought content normal.    Results for orders placed or performed in visit on 04/28/22  CUP PACEART REMOTE DEVICE CHECK  Result Value  Ref Range   Date Time Interrogation Session [id number]    Pulse Generator Manufacturer MERM    Pulse Gen Model Lamar MRI Quad CRT-D    Pulse Gen Serial Number [id number]  Clinic Name [facility]    Implantable Pulse Generator Type Cardiac Resynch Therapy Defibulator    Implantable Pulse Generator Implant Date 81017510    Implantable Lead Manufacturer [facility]    Implantable Lead Model 1458Q Quartet    Implantable Lead Serial Number [id number]    Implantable Lead Implant Date 25852778    Implantable Lead Location Detail 1 UNKNOWN    Implantable Lead Location [id number]    Implantable Lead Connection Status [id number]    Implantable Lead Manufacturer [facility]    Implantable Lead Model [id number] Sprint Quattro Secure    Implantable Lead Serial Number [id number]    Implantable Lead Implant Date 53614431    Implantable Lead Location [id number]    Implantable Lead Connection Status [id number]    Implantable Lead Manufacturer MERM    Implantable Lead Model 5076 CapSureFix Novus    Implantable Lead Serial Number [id number]    Implantable Lead Implant Date 54008676    Implantable Lead Location [id number]    Implantable Lead Connection Status [id number]    Lead Channel Setting Sensing Sensitivity 0.3 mV   Lead Channel Setting Pacing Amplitude 1.25 V   Lead Channel Setting Pacing Pulse Width 0.4 ms   Lead Channel Setting Pacing Amplitude 1.5 V   Lead Channel Setting Pacing Pulse Width 0.4 ms   Lead Channel Setting Pacing Amplitude 2 V   Lead Channel Setting Pacing Capture Mode Monitor Capture    Zone Setting Status Active    Zone Setting Status Inactive    Zone Setting Status Inactive    Zone Setting Status [id number]    Zone Setting Status [phone]    Lead Channel Impedance Value 418 ohm   Lead Channel Sensing Intrinsic Amplitude 1.125 mV   Lead Channel Sensing Intrinsic Amplitude 1.125 mV   Lead Channel Pacing Threshold Amplitude 0.625 V   Lead Channel Pacing Threshold Pulse Width 0.4 ms   Lead Channel  Impedance Value 665 ohm   Lead Channel Impedance Value 589 ohm   Lead Channel Pacing Threshold Amplitude 0.625 V   Lead Channel Pacing Threshold Pulse Width 0.4 ms   HighPow Impedance 55 ohm   HighPow Impedance 77 ohm   Lead Channel Impedance Value 779 ohm   Lead Channel Impedance Value 874 ohm   Lead Channel Impedance Value 874 ohm   Lead Channel Impedance Value 760 ohm   Lead Channel Impedance Value 779 ohm   Lead Channel Impedance Value 836 ohm   Lead Channel Impedance Value 551 ohm   Lead Channel Impedance Value 456 ohm   Lead Channel Impedance Value 475 ohm   Lead Channel Impedance Value 513 ohm   Lead Channel Impedance Value 249.509    Lead Channel Impedance Value 255.093    Lead Channel Impedance Value 265.661    Lead Channel Impedance Value 241.412    Lead Channel Impedance Value 246.635    Lead Channel Pacing Threshold Amplitude 0.875 V   Lead Channel Pacing Threshold Pulse Width 0.4 ms   Battery Status OK    Battery Remaining Longevity 21 mo   Battery Voltage 2.92 V   Brady Statistic RA Percent Paced 5.64 %   Brady Statistic RV Percent Paced 99.98 %   Brady Statistic AP VP Percent 5.69 %   Brady Statistic AS VP Percent 94.3 %   Brady Statistic AP VS Percent 0.01 %   Brady Statistic AS VS Percent 0 %      Assessment & Plan:   Problem List Items Addressed This Visit  Cardiovascular and Mediastinum   Hypertension associated with diabetes ([place])    Chronic, stable with BP at goal on recheck today and on home readings.  Continue current medication regimen and collaboration with cardiology.  Recommend checking BP at home at least a few mornings a week + focus on DASH diet.  LABS: up to date.  Return in 3 months.      Relevant Orders   Bayer DCA Hb A1c Waived   Microalbumin, Urine Waived   Idiopathic cardiomyopathy ([place])    Ongoing, stable, followed by cardiology.  Continue this collaboration.      Systolic CHF (HCC)    Chronic, stable.  Euvolemic today.   Continue current medication regimen and collaboration with cardiology.  LABS: up to date.   Recommend: - Reminded to call for an overnight weight gain of >2 pounds or a weekly weight weight of >5 pounds - not adding salt to his food and has been reading food labels. Reviewed the importance of keeping daily sodium intake to '2000mg'$  daily  - Avoid NSAIDS      Type 2 diabetes mellitus with cardiac complication (HCC) - Primary    Chronic, ongoing with HF.  A1c 7.5% today, trend up due to dietary indiscretions over holidays, urine ALB 10 in January 2024.  Rybelsus & Metformin caused GI issues.  Continue current medication regimen with Farxiga and Januvia 100 MG + continue collaboration with cardiology.  Could consider Sulfonylurea at low dose if needed in future, since he prefers no injectables.  At this time he wishes not to add or change medications.  Recommend he check BS at home at least a few mornings a week with goal fasting <130, bring to next visit.  Continue to collaborate with CCM crew.  Return to office in 3 months.      Relevant Orders   Bayer DCA Hb A1c Waived   Microalbumin, Urine Waived     Endocrine   CKD stage 3 due to type 2 diabetes mellitus ([place])    Ongoing, 3a, improved last visit.  Recheck labs next visit. Urine ALB 19 May 2022 on check.  Consider referral to nephrology if worsening.      Relevant Orders   Bayer DCA Hb A1c Waived   Hyperlipidemia associated with type 2 diabetes mellitus (HCC)    Chronic, ongoing.  No current medication due to poor tolerance.   Would benefit from statin use with diabetes and heart health or consider injectable, discussed with him today.  Lipid panel up to date.  He is agreeable to trial of Rosuvastatin on 3 day or 1 day a week schedule if LDL >70 in future if needed.      Relevant Orders   Bayer DCA Hb A1c Waived   Hypothyroidism    Chronic, ongoing.  Continue current medication regimen and adjust as needed based on labs.  TSH + Free  T4 up to date.        Musculoskeletal and Integument   Drug-induced myopathy    Due to statin therapy, history of on low doses.  Would benefit from statin use with diabetes and heart health or consider injectable, discussed with him today.         Other   Biventricular implantable cardioverter-defibrillator in situ    Continue collaboration with cardiology and regular checks.        Follow up plan: Return in about 3 months (around 08/10/2022) for Annual Physical with diabetes checks.

## 2022-05-11 NOTE — Assessment & Plan Note (Signed)
Chronic, stable with BP at goal on recheck today and on home readings.  Continue current medication regimen and collaboration with cardiology.  Recommend checking BP at home at least a few mornings a week + focus on DASH diet.  LABS: up to date.  Return in 3 months.

## 2022-05-11 NOTE — Assessment & Plan Note (Signed)
Chronic, ongoing with HF.  A1c 7.5% today, trend up due to dietary indiscretions over holidays, urine ALB 10 in January 2024.  Rybelsus & Metformin caused GI issues.  Continue current medication regimen with Farxiga and Januvia 100 MG + continue collaboration with cardiology.  Could consider Sulfonylurea at low dose if needed in future, since he prefers no injectables.  At this time he wishes not to add or change medications.  Recommend he check BS at home at least a few mornings a week with goal fasting <130, bring to next visit.  Continue to collaborate with CCM crew.  Return to office in 3 months.

## 2022-05-11 NOTE — Assessment & Plan Note (Signed)
Ongoing, stable, followed by cardiology.  Continue this collaboration.

## 2022-05-12 ENCOUNTER — Telehealth: Payer: Self-pay

## 2022-05-12 NOTE — Progress Notes (Cosign Needed)
Contacted [employer]  to follow up on patient assistance application for [employer]. Per representative at [employer] states patient has been approved starting 05/01/22 through 05/10/23. Patient was notified that he has been approved. Shipment went out on 04/19/22 should receive on 05/14/22. Next refill will be on March 01/2023.   [name]

## 2022-05-24 NOTE — Progress Notes (Signed)
Remote ICD transmission.   

## 2022-05-28 ENCOUNTER — Ambulatory Visit (INDEPENDENT_AMBULATORY_CARE_PROVIDER_SITE_OTHER): Payer: Medicare Other

## 2022-05-28 ENCOUNTER — Telehealth: Payer: Medicare Other

## 2022-05-28 DIAGNOSIS — I152 Hypertension secondary to endocrine disorders: Secondary | ICD-10-CM

## 2022-05-28 DIAGNOSIS — I5022 Chronic systolic (congestive) heart failure: Secondary | ICD-10-CM

## 2022-05-28 DIAGNOSIS — E1159 Type 2 diabetes mellitus with other circulatory complications: Secondary | ICD-10-CM

## 2022-05-28 NOTE — Patient Instructions (Signed)
Please call the care guide team at [phone] if you need to cancel or reschedule your appointment.   If you are experiencing a Mental Health or [place] or need someone to talk to, please call the Suicide and Crisis Lifeline: 988 call the [place] National Suicide Prevention Lifeline: 215-222-5023 or TTY: 248-217-9068 TTY (828)282-7879) to talk to a trained counselor call 1-800-273-TALK (toll free, 24 hour hotline)   Following is a copy of the CCM Program Consent:  CCM service includes personalized support from designated clinical staff supervised by the physician, including individualized plan of care and coordination with other care providers 24/7 contact phone numbers for assistance for urgent and routine care needs. Service will only be billed when office clinical staff spend 20 minutes or more in a month to coordinate care. Only one practitioner may furnish and bill the service in a calendar month. The patient may stop CCM services at amy time (effective at the end of the month) by phone call to the office staff. The patient will be responsible for cost sharing (co-pay) or up to 20% of the service fee (after annual deductible is met)  Following is a copy of your full provider care plan:   Goals Addressed             This Visit's Progress    CCM Expected Outcome:  Monitor, Self-Manage and Reduce Symptoms of Diabetes       Current Barriers:  Chronic Disease Management support and education needs related to effective management of DM Financial Constraints.  Lab Results  Component Value Date   HGBA1C 7.5 (H) 05/11/2022     Planned Interventions: Provided education to patient about basic DM disease process; Reviewed medications with patient and discussed importance of medication adherence. The patient is taking [place] and Januvia. The patient is still waiting on assistance for Januvia. The patient wishes he could take other medications as they controlled his DM much  better but he could not tolerate the side effects. Education and support given;        Reviewed prescribed diet with patient heart healthy/ADA diet. The patient states he is eating better since the holidays are over. He is also exercising. The patient states he is trying to be mindful of his dietary habits and eat better. Counseled on importance of regular laboratory monitoring as prescribed;        Discussed plans with patient for ongoing care management follow up and provided patient with direct contact information for care management team;      Provided patient with written educational materials related to hypo and hyperglycemia and importance of correct treatment. Denies any highs or lows.;       Reviewed scheduled/upcoming provider appointments including: 08-10-2022 at 10 am;         Advised patient, providing education and rationale, to check cbg once daily and when you have symptoms of low or high blood sugar and record. States his blood sugars have been around 140 to 145. Education on fasting of <130 and 2 hours after eating of <180     Referral made to pharmacy team for assistance with update on Januvia paperwork the patient submitted for help with the cost of Januvia. In basket message sent to the pharm D asking for assistance. The patient heard from the pharm D yesterday and they are still working on his assistance for Januvia       Review of patient status, including review of consultants reports, relevant laboratory and other test  results, and medications completed;       Advised patient to discuss changes in DM, questions, or concerns with provider;      Screening for signs and symptoms of depression related to chronic disease state;        Assessed social determinant of health barriers;         Symptom Management: Take medications as prescribed   Attend all scheduled provider appointments Call provider office for new concerns or questions  call the Suicide and Crisis Lifeline: 988 call  the [place] National Suicide Prevention Lifeline: (434)781-4807 or TTY: 628-519-9882 TTY (308)556-9602) to talk to a trained counselor call 1-800-273-TALK (toll free, 24 hour hotline) if experiencing a Mental Health or [place]  check feet daily for cuts, sores or redness trim toenails straight across manage portion size wash and dry feet carefully every day wear comfortable, cotton socks wear comfortable, well-fitting shoes  Follow Up Plan: Telephone follow up appointment with care management team member scheduled for: 07-30-2022 at 9 am       CCM Expected Outcome:  Monitor, Self-Manage and Reduce Symptoms of Heart Failure       Current Barriers:  Knowledge Deficits related to the importance of daily weights in the effective management of HF Chronic Disease Management support and education needs related to effective management of HF Financial Constraints.  Difficulty obtaining medications  Planned Interventions: Basic overview and discussion of pathophysiology of Heart Failure reviewed. States his heart failure is stable. Denies any acute fluctuations in weight. Knows to call the provider for 2/3+ in one day and +5 in one week. Education and support given.  Provided education on low sodium diet. Review and education provided. The patient is eating healthier since the holidays are over. He is eating sandwiches daily but monitors his sodium intake Reviewed Heart Failure Action Plan in depth and provided written copy Assessed need for readable accurate scales in home Provided education about placing scale on hard, flat surface Advised patient to weigh each morning after emptying bladder Discussed importance of daily weight and advised patient to weigh and record daily Reviewed role of diuretics in prevention of fluid overload and management of heart failure Discussed the importance of keeping all appointments with provider. Saw pcp on 05-11-2022, sees cardiologist on a regular  basis. Denies any current issues. Feels his chronic conditions are stable. The patient has appointment again with pcp on 08-10-2022 at 10 am Provided patient with education about the role of exercise in the management of heart failure. The patient is walking every day for at least 30 minutes. He says he does a brisk 3 miles at times. On days that it is cold he walks of the treadmill that is in his home. Praised for good habits and keeping an exercise routine Advised patient to discuss changes in heart failure or heart health with provider Screening for signs and symptoms of depression related to chronic disease state  Assessed social determinant of health barriers Pharm D works with the patient on a regular basis. He has been approved for [facility] for 2024. He has his supply. Next shipment goes out in April of his Entresto. States compliance  Symptom Management: Take medications as prescribed   Attend all scheduled provider appointments Call pharmacy for medication refills 3-7 days in advance of running out of medications Call provider office for new concerns or questions  call the Suicide and Crisis Lifeline: 988 call the [place] National Suicide Prevention Lifeline: 872-367-6382 or TTY: (813)115-8249 TTY (662) 760-0454)  to talk to a trained counselor call 1-800-273-TALK (toll free, 24 hour hotline) if experiencing a Mental Health or [place]  call office if I gain more than 2 pounds in one day or 5 pounds in one week use salt in moderation watch for swelling in feet, ankles and legs every day weigh myself daily develop a rescue plan follow rescue plan if symptoms flare-up track symptoms and what helps feel better or worse dress right for the weather, hot or cold  Follow Up Plan: Telephone follow up appointment with care management team member scheduled for: 08-10-2022 at 0900 am       CCM Expected Outcome:  Monitor, Self-Manage, and Reduce Symptoms of Hypertension       Current  Barriers:  Knowledge Deficits related to the importance of monitoring blood pressures on a regular basis to be proactive in monitoring for changes and the increased risk of heart attack and stroke with blood pressures out of range. Chronic Disease Management support and education needs related to effective management of HTN  BP Readings from Last 3 Encounters:  05/11/22 122/62  04/26/22 (!) 153/93  02/09/22 128/78    Planned Interventions: Evaluation of current treatment plan related to hypertension self management and patient's adherence to plan as established by provider. The patient saw the pcp recently and blood pressures are stable. The patient denies any acute changes in HTN or heart healthy. Sees pcp and specialist on a regular basis;   Provided education to patient re: stroke prevention, s/s of heart attack and stroke; Reviewed prescribed diet heart healthy/ADA diet. Review of heart healthy/ADA diet. The patient states he is eating better and exercising now that the holidays are over. He is doing well and feels great. Reviewed medications with patient and discussed importance of compliance. The patient is compliant with medications.The patient got approval for 2024 for his Entresto. He is getting that without difficulty and is thankful for the assistance ;  Discussed plans with patient for ongoing care management follow up and provided patient with direct contact information for care management team; Advised patient, providing education and rationale, to monitor blood pressure daily and record, calling PCP for findings outside established parameters;  Reviewed scheduled/upcoming provider appointments including: 08-10-2022 at 1000 am Advised patient to discuss changes in blood pressures or questions/concerns about heart health with provider; Provided education on prescribed diet heart healthy/ADA diet. Review of sodium restrictions of <2000 mg daily ;  Discussed complications of poorly  controlled blood pressure such as heart disease, stroke, circulatory complications, vision complications, kidney impairment, sexual dysfunction;  Screening for signs and symptoms of depression related to chronic disease state;  Assessed social determinant of health barriers;   Symptom Management: Take medications as prescribed   Attend all scheduled provider appointments Call provider office for new concerns or questions  call the Suicide and Crisis Lifeline: 988 call the [place] National Suicide Prevention Lifeline: 607-629-3104 or TTY: 2183542611 TTY 347-403-4162) to talk to a trained counselor call 1-800-273-TALK (toll free, 24 hour hotline) if experiencing a Mental Health or [place]  check blood pressure weekly learn about high blood pressure keep a blood pressure log take blood pressure log to all doctor appointments call doctor for signs and symptoms of high blood pressure develop an action plan for high blood pressure keep all doctor appointments take medications for blood pressure exactly as prescribed report new symptoms to your doctor  Follow Up Plan: Telephone follow up appointment with care management team member scheduled  for: 07-30-2022 at 0900 am          Patient verbalizes understanding of instructions and care plan provided today and agrees to view in [place]. Active MyChart status and patient understanding of how to access instructions and care plan via MyChart confirmed with patient.     Telephone follow up appointment with care management team member scheduled for: 07-30-2022 at 0900 am

## 2022-05-28 NOTE — Chronic Care Management (AMB) (Signed)
Chronic Care Management   CCM RN Visit Note  05/28/2022 Name: Ryan [name] [name]: [id number] DOB: 1950-09-19  Subjective: Ryan [name] is a 72 y.o. year old male who is a primary care patient of Cannady, Barbaraann Faster, NP. The patient was referred to the Chronic Care Management team for assistance with care management needs subsequent to provider initiation of CCM services and plan of care.    Today's Visit:  Engaged with patient by telephone for follow up visit.        Goals Addressed             This Visit's Progress    CCM Expected Outcome:  Monitor, Self-Manage and Reduce Symptoms of Diabetes       Current Barriers:  Chronic Disease Management support and education needs related to effective management of DM Financial Constraints.  Lab Results  Component Value Date   HGBA1C 7.5 (H) 05/11/2022     Planned Interventions: Provided education to patient about basic DM disease process; Reviewed medications with patient and discussed importance of medication adherence. The patient is taking [place] and Januvia. The patient is still waiting on assistance for Januvia. The patient wishes he could take other medications as they controlled his DM much better but he could not tolerate the side effects. Education and support given;        Reviewed prescribed diet with patient heart healthy/ADA diet. The patient states he is eating better since the holidays are over. He is also exercising. The patient states he is trying to be mindful of his dietary habits and eat better. Counseled on importance of regular laboratory monitoring as prescribed;        Discussed plans with patient for ongoing care management follow up and provided patient with direct contact information for care management team;      Provided patient with written educational materials related to hypo and hyperglycemia and importance of correct treatment. Denies any highs or lows.;       Reviewed scheduled/upcoming provider  appointments including: 08-10-2022 at 10 am;         Advised patient, providing education and rationale, to check cbg once daily and when you have symptoms of low or high blood sugar and record. States his blood sugars have been around 140 to 145. Education on fasting of <130 and 2 hours after eating of <180     Referral made to pharmacy team for assistance with update on Januvia paperwork the patient submitted for help with the cost of Januvia. In basket message sent to the pharm D asking for assistance. The patient heard from the pharm D yesterday and they are still working on his assistance for Januvia       Review of patient status, including review of consultants reports, relevant laboratory and other test results, and medications completed;       Advised patient to discuss changes in DM, questions, or concerns with provider;      Screening for signs and symptoms of depression related to chronic disease state;        Assessed social determinant of health barriers;         Symptom Management: Take medications as prescribed   Attend all scheduled provider appointments Call provider office for new concerns or questions  call the Suicide and Crisis Lifeline: 988 call the [place] National Suicide Prevention Lifeline: 970-523-7577 or TTY: 715-100-4875 TTY 778-740-7226) to talk to a trained counselor call 1-800-273-TALK (toll free, 24 hour hotline) if experiencing a  Mental Health or [place]  check feet daily for cuts, sores or redness trim toenails straight across manage portion size wash and dry feet carefully every day wear comfortable, cotton socks wear comfortable, well-fitting shoes  Follow Up Plan: Telephone follow up appointment with care management team member scheduled for: 07-30-2022 at 9 am       CCM Expected Outcome:  Monitor, Self-Manage and Reduce Symptoms of Heart Failure       Current Barriers:  Knowledge Deficits related to the importance of daily weights in  the effective management of HF Chronic Disease Management support and education needs related to effective management of HF Financial Constraints.  Difficulty obtaining medications  Planned Interventions: Basic overview and discussion of pathophysiology of Heart Failure reviewed. States his heart failure is stable. Denies any acute fluctuations in weight. Knows to call the provider for 2/3+ in one day and +5 in one week. Education and support given.  Provided education on low sodium diet. Review and education provided. The patient is eating healthier since the holidays are over. He is eating sandwiches daily but monitors his sodium intake Reviewed Heart Failure Action Plan in depth and provided written copy Assessed need for readable accurate scales in home Provided education about placing scale on hard, flat surface Advised patient to weigh each morning after emptying bladder Discussed importance of daily weight and advised patient to weigh and record daily Reviewed role of diuretics in prevention of fluid overload and management of heart failure Discussed the importance of keeping all appointments with provider. Saw pcp on 05-11-2022, sees cardiologist on a regular basis. Denies any current issues. Feels his chronic conditions are stable. The patient has appointment again with pcp on 08-10-2022 at 10 am Provided patient with education about the role of exercise in the management of heart failure. The patient is walking every day for at least 30 minutes. He says he does a brisk 3 miles at times. On days that it is cold he walks of the treadmill that is in his home. Praised for good habits and keeping an exercise routine Advised patient to discuss changes in heart failure or heart health with provider Screening for signs and symptoms of depression related to chronic disease state  Assessed social determinant of health barriers Pharm D works with the patient on a regular basis. He has been approved for  [facility] for 2024. He has his supply. Next shipment goes out in April of his Entresto. States compliance  Symptom Management: Take medications as prescribed   Attend all scheduled provider appointments Call pharmacy for medication refills 3-7 days in advance of running out of medications Call provider office for new concerns or questions  call the Suicide and Crisis Lifeline: 988 call the [place] National Suicide Prevention Lifeline: 5747651170 or TTY: 7402002084 TTY (936)284-1543) to talk to a trained counselor call 1-800-273-TALK (toll free, 24 hour hotline) if experiencing a Mental Health or [place]  call office if I gain more than 2 pounds in one day or 5 pounds in one week use salt in moderation watch for swelling in feet, ankles and legs every day weigh myself daily develop a rescue plan follow rescue plan if symptoms flare-up track symptoms and what helps feel better or worse dress right for the weather, hot or cold  Follow Up Plan: Telephone follow up appointment with care management team member scheduled for: 08-10-2022 at 0900 am       CCM Expected Outcome:  Monitor, Self-Manage, and Reduce  Symptoms of Hypertension       Current Barriers:  Knowledge Deficits related to the importance of monitoring blood pressures on a regular basis to be proactive in monitoring for changes and the increased risk of heart attack and stroke with blood pressures out of range. Chronic Disease Management support and education needs related to effective management of HTN  BP Readings from Last 3 Encounters:  05/11/22 122/62  04/26/22 (!) 153/93  02/09/22 128/78    Planned Interventions: Evaluation of current treatment plan related to hypertension self management and patient's adherence to plan as established by provider. The patient saw the pcp recently and blood pressures are stable. The patient denies any acute changes in HTN or heart healthy. Sees pcp and specialist on a  regular basis;   Provided education to patient re: stroke prevention, s/s of heart attack and stroke; Reviewed prescribed diet heart healthy/ADA diet. Review of heart healthy/ADA diet. The patient states he is eating better and exercising now that the holidays are over. He is doing well and feels great. Reviewed medications with patient and discussed importance of compliance. The patient is compliant with medications.The patient got approval for 2024 for his Entresto. He is getting that without difficulty and is thankful for the assistance ;  Discussed plans with patient for ongoing care management follow up and provided patient with direct contact information for care management team; Advised patient, providing education and rationale, to monitor blood pressure daily and record, calling PCP for findings outside established parameters;  Reviewed scheduled/upcoming provider appointments including: 08-10-2022 at 1000 am Advised patient to discuss changes in blood pressures or questions/concerns about heart health with provider; Provided education on prescribed diet heart healthy/ADA diet. Review of sodium restrictions of <2000 mg daily ;  Discussed complications of poorly controlled blood pressure such as heart disease, stroke, circulatory complications, vision complications, kidney impairment, sexual dysfunction;  Screening for signs and symptoms of depression related to chronic disease state;  Assessed social determinant of health barriers;   Symptom Management: Take medications as prescribed   Attend all scheduled provider appointments Call provider office for new concerns or questions  call the Suicide and Crisis Lifeline: 988 call the [place] National Suicide Prevention Lifeline: (913)310-5536 or TTY: (212)530-5981 TTY 786-875-2895) to talk to a trained counselor call 1-800-273-TALK (toll free, 24 hour hotline) if experiencing a Mental Health or [place]  check blood pressure  weekly learn about high blood pressure keep a blood pressure log take blood pressure log to all doctor appointments call doctor for signs and symptoms of high blood pressure develop an action plan for high blood pressure keep all doctor appointments take medications for blood pressure exactly as prescribed report new symptoms to your doctor  Follow Up Plan: Telephone follow up appointment with care management team member scheduled for: 07-30-2022 at 0900 am          Plan:Telephone follow up appointment with care management team member scheduled for:  07-30-2022 at 0900 am  [place], MSN, CCM RN Care Manager  Chronic Care Management Direct Number: [phone]

## 2022-06-04 ENCOUNTER — Telehealth: Payer: Self-pay | Admitting: Nurse Practitioner

## 2022-06-04 NOTE — Telephone Encounter (Signed)
Pt's wife dropped [employer] Patient Assistance Program paperwork to be filled out.  Put in provider's folder.

## 2022-06-08 NOTE — Telephone Encounter (Signed)
Pharmacy team notified via teams 

## 2022-06-08 NOTE — Telephone Encounter (Signed)
Paperwork placed in Pharmacy folder 

## 2022-06-09 DIAGNOSIS — I5020 Unspecified systolic (congestive) heart failure: Secondary | ICD-10-CM | POA: Diagnosis not present

## 2022-06-09 DIAGNOSIS — I110 Hypertensive heart disease with heart failure: Secondary | ICD-10-CM

## 2022-06-09 DIAGNOSIS — E1159 Type 2 diabetes mellitus with other circulatory complications: Secondary | ICD-10-CM

## 2022-06-24 ENCOUNTER — Telehealth: Payer: Self-pay

## 2022-06-24 NOTE — Progress Notes (Cosign Needed)
Care Management & Coordination Services Pharmacy Team  Reason for Encounter: Patient assistance renewal   [place]  [place] for the medication Farxiga. Representative stated that patient is missing proof of income for 2023. Proof of income needs to be fax back with cover letter.   Ryan [name]

## 2022-07-12 ENCOUNTER — Telehealth: Payer: Self-pay

## 2022-07-12 NOTE — Progress Notes (Cosign Needed)
Care Management & Coordination Services Pharmacy Team  Reason for Encounter: Patient assistance for Ryan [name] stated that patient was denied because income exceeded income eligibility.  [name]

## 2022-07-14 ENCOUNTER — Telehealth: Payer: Self-pay

## 2022-07-14 NOTE — Progress Notes (Cosign Needed)
Care Management & Coordination Services Pharmacy Team  Reason for Encounter: General adherence update   Contacted patient for general health update and medication adherence call.  Spoke with patient on 07/21/2022    Recent office visits:  05/11/22-[name], NP (PCP) Seen for general follow up visit. Labs ordered. Follow up in 3 months.   Recent consult visits:  04/26/22-David Trinda Pascal, MD (Dermatology) Seen for annual exam. Follow up in 1 year.   Hospital visits:  None in previous 6 months  Medications: Outpatient Encounter Medications as of 07/14/2022  Medication Sig   carvedilol (COREG) 12.5 MG tablet Take 1 tablet (12.5 mg total) by mouth 2 (two) times daily.   Cholecalciferol (VITAMIN D3 SUPER STRENGTH) 50 MCG (2000 UT) CAPS Take 2,000 Units by mouth daily.   dapagliflozin propanediol (FARXIGA) 10 MG TABS tablet Take 10 mg by mouth daily.   dexlansoprazole (DEXILANT) 60 MG capsule Take 1 capsule (60 mg total) by mouth daily.   febuxostat (ULORIC) 40 MG tablet Take 1 tablet (40 mg total) by mouth daily.   levothyroxine (SYNTHROID) 50 MCG tablet Take 1 tablet (50 mcg total) by mouth daily.   sacubitril-valsartan (ENTRESTO) 24-26 MG Take 1 tablet by mouth 2 (two) times daily.   sitaGLIPtin (JANUVIA) 100 MG tablet Take 1 tablet (100 mg total) by mouth daily.   spironolactone (ALDACTONE) 25 MG tablet Take 1 tablet (25 mg total) by mouth daily.   No facility-administered encounter medications on file as of 07/14/2022.    Recent vitals BP Readings from Last 3 Encounters:  05/11/22 122/62  04/26/22 (!) 153/93  02/09/22 128/78   Pulse Readings from Last 3 Encounters:  05/11/22 70  04/26/22 74  02/09/22 73   Wt Readings from Last 3 Encounters:  05/11/22 201 lb 8 oz (91.4 kg)  02/09/22 196 lb 6.4 oz (89.1 kg)  01/25/22 199 lb 9.6 oz (90.5 kg)   BMI Readings from Last 3 Encounters:  05/11/22 30.65 kg/m  02/09/22 25.49 kg/m  01/25/22 26.68 kg/m    Recent lab  results    Component Value Date/Time   NA 141 01/25/2022 1023   K 4.4 01/25/2022 1023   CL 106 01/25/2022 1023   CO2 21 01/25/2022 1023   GLUCOSE 131 (H) 01/25/2022 1023   GLUCOSE 136 (H) 12/31/2016 1434   BUN 20 01/25/2022 1023   CREATININE 1.24 01/25/2022 1023   CALCIUM 9.5 01/25/2022 1023    Lab Results  Component Value Date   CREATININE 1.24 01/25/2022   EGFR 62 01/25/2022   GFRNONAA 51 (L) 06/04/2020   GFRAA 59 (L) 06/04/2020   Lab Results  Component Value Date/Time   HGBA1C 7.5 (H) 05/11/2022 09:16 AM   HGBA1C 7.0 (H) 01/25/2022 10:20 AM   HGBA1C 6.9 11/08/2017 12:00 AM   HGBA1C 6.9 11/08/2017 12:00 AM   MICROALBUR 10 05/11/2022 09:16 AM   MICROALBUR 10 07/10/2021 08:44 AM    Lab Results  Component Value Date   CHOL 153 01/25/2022   HDL 32 (L) 01/25/2022   LDLCALC 73 01/25/2022   TRIG 294 (H) 01/25/2022   CHOLHDL 4.0 05/24/2018   What concerns do you have about your medications? Patient states he was wanting to know his update on his application on his Januvia and he received a letter stating that he needs to provide income state for 2023 but he does not get his statement until October. I stated that I will confirm with Olivia Mackie about the application status since. Olivia Mackie will reach out  to him.  The patient denies side effects with their medications.   How often do you forget or accidentally miss a dose? Rarely  Are you having any problems getting your medications from your pharmacy? No  Has the cost of your medications been a concern? Yes If yes, what medication and is patient assistance available or has it been applied for? Patient is in the process of patient assistance on Januvia.  Since last visit with PharmD, no interventions have been made.   The patient has not had an ED visit since last contact.   The patient denies problems with their health.   Patient denies concerns or questions for [place] Constable, PharmD at this time.   Counseled patient on:  Importance of taking medication daily without missed doses  Care Gaps: Annual wellness visit in last year? Yes  If Diabetic: Last eye exam / retinopathy screening:09/2021 Last diabetic foot exam:07/10/21 Last UACR: 05/11/2022  Star Rating Drugs:  Farxiga 10 mg Last filled:12/23/21 30 DS, 11/26/21 30 Sacubitril-valsartan 24-26 mg Last filled:05/12/22 30 DS, 04/15/22 30 DS Januvia 100 mg Last filled:06/21/22 30 DS, 05/24/22 30 DS  Myriam Elta [place], RMA

## 2022-07-15 ENCOUNTER — Other Ambulatory Visit: Payer: Self-pay | Admitting: Nurse Practitioner

## 2022-07-15 NOTE — Telephone Encounter (Signed)
Future visit in 3 weeks. Requested Prescriptions  Pending Prescriptions Disp Refills   carvedilol (COREG) 12.5 MG tablet [Pharmacy Med Name: CARVEDILOL 12.5 MG TABLET] 180 tablet 0    Sig: Take 1 tablet (12.5 mg total) by mouth 2 (two) times daily.     Cardiovascular: Beta Blockers 3 Failed - 07/15/2022  7:43 AM      Failed - AST in normal range and within 360 days    AST  Date Value Ref Range Status  07/10/2021 16 0 - 40 IU/L Final   AST (SGOT) Piccolo, Waived  Date Value Ref Range Status  01/10/2019 25 11 - 38 U/L Final         Failed - ALT in normal range and within 360 days    ALT  Date Value Ref Range Status  07/10/2021 30 0 - 44 IU/L Final   ALT (SGPT) Piccolo, Waived  Date Value Ref Range Status  01/10/2019 35 10 - 47 U/L Final         Passed - Cr in normal range and within 360 days    Creatinine, Ser  Date Value Ref Range Status  01/25/2022 1.24 0.76 - 1.27 mg/dL Final         Passed - Last BP in normal range    BP Readings from Last 1 Encounters:  05/11/22 122/62         Passed - Last Heart Rate in normal range    Pulse Readings from Last 1 Encounters:  05/11/22 70         Passed - Valid encounter within last 6 months    Recent Outpatient Visits           2 months ago Type 2 diabetes mellitus with cardiac complication ([place])   [place] Ainsworth, Franklin T, NP   5 months ago Type 2 diabetes mellitus with cardiac complication ([place])   [place] Mullan, Lometa T, NP   8 months ago Type 2 diabetes mellitus with cardiac complication ([place])   [place] Sealy, [name] T, NP   11 months ago Type 2 diabetes mellitus with cardiac complication ([place])   [place] Sidman, Henrine Screws T, NP   1 year ago Type 2 diabetes mellitus with cardiac complication ([place])   [place], Barbaraann Faster, NP       Future Appointments             In 3  weeks Cannady, Barbaraann Faster, NP [place], [place]   In 9 months Ralene Bathe, MD [place]

## 2022-07-22 NOTE — Progress Notes (Signed)
Spoke with patient this morning discuss PAP for Januvia. Patient stated that he did receive the attestation form from [place] and completed the form back in [place]. When I called [id number] application had not been received. Made a call to [employer] this morning and application was received on 07/15/22, patient was approved. Shipment went out for the patient on 07/21/22 for a 90 ds. Phone patient back to let him know he had been approved.   [name]

## 2022-07-23 ENCOUNTER — Other Ambulatory Visit: Payer: Self-pay | Admitting: Nurse Practitioner

## 2022-07-23 DIAGNOSIS — I429 Cardiomyopathy, unspecified: Secondary | ICD-10-CM

## 2022-07-23 NOTE — Telephone Encounter (Signed)
Requested medication (s) are due for refill today: expired medication  Requested medication (s) are on the active medication list: yes   Last refill:  07/10/21 #90 4 refills  Future visit scheduled: yes in 2 weeks   Notes to clinic:  expired medication . Do you want to renew Rx?     Requested Prescriptions  Pending Prescriptions Disp Refills   dexlansoprazole (DEXILANT) 60 MG capsule [Pharmacy Med Name: DEXLANSOPRAZOLE DR 60 MG CAP] 90 capsule 0    Sig: Take 1 capsule (60 mg total) by mouth daily.     Gastroenterology: Proton Pump Inhibitors Passed - 07/23/2022  3:01 PM      Passed - Valid encounter within last 12 months    Recent Outpatient Visits           2 months ago Type 2 diabetes mellitus with cardiac complication ([place])   [place], Naylor T, NP   5 months ago Type 2 diabetes mellitus with cardiac complication ([place])   [place] Franquez, [name] T, NP   9 months ago Type 2 diabetes mellitus with cardiac complication ([place])   [place], Marshall T, NP   11 months ago Type 2 diabetes mellitus with cardiac complication ([place])   [place], Henrine Screws T, NP   1 year ago Type 2 diabetes mellitus with cardiac complication [facility])   [place] Venita Lick, NP       Future Appointments             In 2 weeks Venita Lick, NP [place], [facility]   In 9 months Ralene Bathe, MD [place]

## 2022-07-28 ENCOUNTER — Ambulatory Visit (INDEPENDENT_AMBULATORY_CARE_PROVIDER_SITE_OTHER): Payer: Medicare Other

## 2022-07-28 DIAGNOSIS — I428 Other cardiomyopathies: Secondary | ICD-10-CM | POA: Diagnosis not present

## 2022-07-29 LAB — CUP PACEART REMOTE DEVICE CHECK
Battery Remaining Longevity: 21 mo
Battery Voltage: 2.91 V
Brady Statistic AP VP Percent: 3.7 %
Brady Statistic AP VS Percent: 0.01 %
Brady Statistic AS VP Percent: 96.29 %
Brady Statistic AS VS Percent: 0 %
Brady Statistic RA Percent Paced: 3.7 %
Brady Statistic RV Percent Paced: 99.98 %
Date Time Interrogation Session: 20240320001803
HighPow Impedance: 53 Ohm
HighPow Impedance: 72 Ohm
Implantable Lead Connection Status: 753985
Implantable Lead Connection Status: 753985
Implantable Lead Connection Status: 753985
Implantable Lead Implant Date: 20060505
Implantable Lead Implant Date: 20120111
Implantable Lead Implant Date: 20180314
Implantable Lead Location: 753858
Implantable Lead Location: 753859
Implantable Lead Location: 753860
Implantable Lead Model: 5076
Implantable Lead Model: 6947
Implantable Pulse Generator Implant Date: 20180314
Lead Channel Impedance Value: 230.327
Lead Channel Impedance Value: 230.327
Lead Channel Impedance Value: 246.635
Lead Channel Impedance Value: 246.635
Lead Channel Impedance Value: 256.5 Ohm
Lead Channel Impedance Value: 418 Ohm
Lead Channel Impedance Value: 418 Ohm
Lead Channel Impedance Value: 475 Ohm
Lead Channel Impedance Value: 513 Ohm
Lead Channel Impedance Value: 513 Ohm
Lead Channel Impedance Value: 589 Ohm
Lead Channel Impedance Value: 646 Ohm
Lead Channel Impedance Value: 722 Ohm
Lead Channel Impedance Value: 760 Ohm
Lead Channel Impedance Value: 779 Ohm
Lead Channel Impedance Value: 779 Ohm
Lead Channel Impedance Value: 836 Ohm
Lead Channel Impedance Value: 931 Ohm
Lead Channel Pacing Threshold Amplitude: 0.625 V
Lead Channel Pacing Threshold Amplitude: 0.625 V
Lead Channel Pacing Threshold Amplitude: 1 V
Lead Channel Pacing Threshold Pulse Width: 0.4 ms
Lead Channel Pacing Threshold Pulse Width: 0.4 ms
Lead Channel Pacing Threshold Pulse Width: 0.4 ms
Lead Channel Sensing Intrinsic Amplitude: 1.25 mV
Lead Channel Sensing Intrinsic Amplitude: 1.25 mV
Lead Channel Setting Pacing Amplitude: 1.25 V
Lead Channel Setting Pacing Amplitude: 1.5 V
Lead Channel Setting Pacing Amplitude: 2 V
Lead Channel Setting Pacing Pulse Width: 0.4 ms
Lead Channel Setting Pacing Pulse Width: 0.4 ms
Lead Channel Setting Sensing Sensitivity: 0.3 mV
Zone Setting Status: 755011
Zone Setting Status: 755011

## 2022-07-30 ENCOUNTER — Telehealth: Payer: Medicare Other

## 2022-07-30 ENCOUNTER — Ambulatory Visit (INDEPENDENT_AMBULATORY_CARE_PROVIDER_SITE_OTHER): Payer: Medicare Other

## 2022-07-30 DIAGNOSIS — E1159 Type 2 diabetes mellitus with other circulatory complications: Secondary | ICD-10-CM

## 2022-07-30 DIAGNOSIS — I152 Hypertension secondary to endocrine disorders: Secondary | ICD-10-CM

## 2022-07-30 DIAGNOSIS — I5022 Chronic systolic (congestive) heart failure: Secondary | ICD-10-CM

## 2022-07-30 NOTE — Patient Instructions (Signed)
Please call the care guide team at [phone] if you need to cancel or reschedule your appointment.   If you are experiencing a Mental Health or [place] or need someone to talk to, please call the Suicide and Crisis Lifeline: 988 call the [place] National Suicide Prevention Lifeline: (732) 394-0673 or TTY: (858)127-7783 TTY (603)863-8419) to talk to a trained counselor call 1-800-273-TALK (toll free, 24 hour hotline)   Following is a copy of the CCM Program Consent:  CCM service includes personalized support from designated clinical staff supervised by the physician, including individualized plan of care and coordination with other care providers 24/7 contact phone numbers for assistance for urgent and routine care needs. Service will only be billed when office clinical staff spend 20 minutes or more in a month to coordinate care. Only one practitioner may furnish and bill the service in a calendar month. The patient may stop CCM services at amy time (effective at the end of the month) by phone call to the office staff. The patient will be responsible for cost sharing (co-pay) or up to 20% of the service fee (after annual deductible is met)  Following is a copy of your full provider care plan:   Goals Addressed             This Visit's Progress    CCM Expected Outcome:  Monitor, Self-Manage and Reduce Symptoms of Diabetes       Current Barriers:  Chronic Disease Management support and education needs related to effective management of DM Financial Constraints.  Lab Results  Component Value Date   HGBA1C 7.5 (H) 05/11/2022     Planned Interventions: Provided education to patient about basic DM disease process. The patient states he is working hard and he feels his ALC is going to be below 7.0. He states he has been working hard and he feels great. Is monitoring his dietary intake; Reviewed medications with patient and discussed importance of medication adherence. The  patient is taking [place] and Januvia. The patient was approved for assistance for Januvia and received a shipment of Januvia last Friday on the 15th of March. The patient wishes he could take other medications as they controlled his DM much better but he could not tolerate the side effects. Education and support given;        Reviewed prescribed diet with patient heart healthy/ADA diet. The patient states he is eating better since the holidays are over. He is also exercising. The patient states he is trying to be mindful of his dietary habits and eat better. Counseled on importance of regular laboratory monitoring as prescribed;        Discussed plans with patient for ongoing care management follow up and provided patient with direct contact information for care management team;      Provided patient with written educational materials related to hypo and hyperglycemia and importance of correct treatment. Denies any highs or lows.;       Reviewed scheduled/upcoming provider appointments including: 08-10-2022 at 10 am;         Advised patient, providing education and rationale, to check cbg once daily and when you have symptoms of low or high blood sugar and record. States his blood sugars this am was 160 and that is because he did not get his exercise in and he ate rice last night. Education on fasting of <130 and 2 hours after eating of <180     Referral made to pharmacy team for assistance with update on  Januvia. The patient has been approved for Januvia and will get it for the year 2024.   Review of patient status, including review of consultants reports, relevant laboratory and other test results, and medications completed;       Advised patient to discuss changes in DM, questions, or concerns with provider;      Screening for signs and symptoms of depression related to chronic disease state;        Assessed social determinant of health barriers;         Symptom Management: Take medications as  prescribed   Attend all scheduled provider appointments Call provider office for new concerns or questions  call the Suicide and Crisis Lifeline: 988 call the [place] National Suicide Prevention Lifeline: 612-384-3093 or TTY: (208)416-5652 TTY (732)433-9831) to talk to a trained counselor call 1-800-273-TALK (toll free, 24 hour hotline) if experiencing a Mental Health or [place]  check feet daily for cuts, sores or redness trim toenails straight across manage portion size wash and dry feet carefully every day wear comfortable, cotton socks wear comfortable, well-fitting shoes  Follow Up Plan: Telephone follow up appointment with care management team member scheduled for: 10-08-2022 at 9 am       CCM Expected Outcome:  Monitor, Self-Manage and Reduce Symptoms of Heart Failure       Current Barriers:  Knowledge Deficits related to the importance of daily weights in the effective management of HF Chronic Disease Management support and education needs related to effective management of HF Financial Constraints.  Difficulty obtaining medications  Wt Readings from Last 3 Encounters:  05/11/22 201 lb 8 oz (91.4 kg)  02/09/22 196 lb 6.4 oz (89.1 kg)  01/25/22 199 lb 9.6 oz (90.5 kg)     Planned Interventions: Basic overview and discussion of pathophysiology of Heart Failure reviewed. States his heart failure is stable. Denies any swelling in his feet of legs.  Denies any acute fluctuations in weight. Knows to call the provider for 2/3+ in one day and +5 in one week. Had his pacemaker checked on Tuesday of this week and he states that his battery is good for another year and 9 months. Education and support given.  Provided education on low sodium diet. Review and education provided. The patient is eating healthier. He was off schedule a little due to the last couple of days of driving and being out of town but he is getting back on track now. He is eating sandwiches daily but  monitors his sodium intake Reviewed Heart Failure Action Plan in depth and provided written copy Assessed need for readable accurate scales in home Provided education about placing scale on hard, flat surface Advised patient to weigh each morning after emptying bladder Discussed importance of daily weight and advised patient to weigh and record daily Reviewed role of diuretics in prevention of fluid overload and management of heart failure Discussed the importance of keeping all appointments with provider.  Sees cardiologist on a regular basis. Denies any current issues. Feels his chronic conditions are stable. The patient has appointment again with pcp on 08-10-2022 at 10 am Provided patient with education about the role of exercise in the management of heart failure. The patient is walking every day for at least 30 minutes and is doing this every day.  He says he does a brisk 3 miles at times. On days that it is cold he walks of the treadmill that is in his home. Praised for good habits and keeping  an exercise routine Advised patient to discuss changes in heart failure or heart health with provider Screening for signs and symptoms of depression related to chronic disease state  Assessed social determinant of health barriers Pharm D works with the patient on a regular basis. He has been approved for [facility] for 2024. He has his supply. Next shipment goes out in April of his Entresto. States compliance  Symptom Management: Take medications as prescribed   Attend all scheduled provider appointments Call pharmacy for medication refills 3-7 days in advance of running out of medications Call provider office for new concerns or questions  call the Suicide and Crisis Lifeline: 988 call the [place] National Suicide Prevention Lifeline: 6056511263 or TTY: (401) 123-2782 TTY (713) 599-7535) to talk to a trained counselor call 1-800-273-TALK (toll free, 24 hour hotline) if experiencing a Mental Health or  [place]  call office if I gain more than 2 pounds in one day or 5 pounds in one week use salt in moderation watch for swelling in feet, ankles and legs every day weigh myself daily develop a rescue plan follow rescue plan if symptoms flare-up track symptoms and what helps feel better or worse dress right for the weather, hot or cold  Follow Up Plan: Telephone follow up appointment with care management team member scheduled for: 10-08-2022 at 0900 am       CCM Expected Outcome:  Monitor, Self-Manage, and Reduce Symptoms of Hypertension       Current Barriers:  Knowledge Deficits related to the importance of monitoring blood pressures on a regular basis to be proactive in monitoring for changes and the increased risk of heart attack and stroke with blood pressures out of range. Chronic Disease Management support and education needs related to effective management of HTN  BP Readings from Last 3 Encounters:  05/11/22 122/62  04/26/22 (!) 153/93  02/09/22 128/78  Self reported this am was 136/83, pulse of 66  Planned Interventions: Evaluation of current treatment plan related to hypertension self management and patient's adherence to plan as established by provider. The patient states that his blood pressures have been good at home. The patient denies any acute changes in HTN or heart healthy. Sees pcp and specialist on a regular basis;   Provided education to patient re: stroke prevention, s/s of heart attack and stroke; Reviewed prescribed diet heart healthy/ADA diet. Review of heart healthy/ADA diet. The patient states he is eating better and exercising daily by walking at least 30 mins a day.  He is doing well and feels great. Reviewed medications with patient and discussed importance of compliance. The patient is compliant with medications.The patient got approval for 2024 for his Entresto. He is getting that without difficulty and is thankful for the assistance. Works  with the [place] D on a regular basis ;  Discussed plans with patient for ongoing care management follow up and provided patient with direct contact information for care management team; Advised patient, providing education and rationale, to monitor blood pressure daily and record, calling PCP for findings outside established parameters;  Reviewed scheduled/upcoming provider appointments including: 08-10-2022 at 1000 am Advised patient to discuss changes in blood pressures or questions/concerns about heart health with provider; Provided education on prescribed diet heart healthy/ADA diet. Review of sodium restrictions of <2000 mg daily ;  Discussed complications of poorly controlled blood pressure such as heart disease, stroke, circulatory complications, vision complications, kidney impairment, sexual dysfunction;  Screening for signs and symptoms of depression related to chronic disease  state;  Assessed social determinant of health barriers;   Symptom Management: Take medications as prescribed   Attend all scheduled provider appointments Call provider office for new concerns or questions  call the Suicide and Crisis Lifeline: 988 call the [place] National Suicide Prevention Lifeline: 6140424771 or TTY: (631)225-3696 TTY 530 857 5125) to talk to a trained counselor call 1-800-273-TALK (toll free, 24 hour hotline) if experiencing a Mental Health or [place]  check blood pressure weekly learn about high blood pressure keep a blood pressure log take blood pressure log to all doctor appointments call doctor for signs and symptoms of high blood pressure develop an action plan for high blood pressure keep all doctor appointments take medications for blood pressure exactly as prescribed report new symptoms to your doctor  Follow Up Plan: Telephone follow up appointment with care management team member scheduled for: 10-08-2022 at 0900 am          Patient verbalizes  understanding of instructions and care plan provided today and agrees to view in [place]. Active MyChart status and patient understanding of how to access instructions and care plan via MyChart confirmed with patient.     Telephone follow up appointment with care management team member scheduled for:

## 2022-07-30 NOTE — Chronic Care Management (AMB) (Signed)
Chronic Care Management   CCM RN Visit Note  07/30/2022 Name: Ryan [name] [name]: [id number] DOB: 16-Jul-1950  Subjective: Ryan [name] is a 72 y.o. year old male who is a primary care patient of Cannady, Barbaraann Faster, NP. The patient was referred to the Chronic Care Management team for assistance with care management needs subsequent to provider initiation of CCM services and plan of care.    Today's Visit:  Engaged with patient by telephone for follow up visit.     SDOH Interventions Today    Flowsheet Row Most Recent Value  SDOH Interventions   Physical Activity Interventions Intervention Not Indicated, Other (Comments)  [the patient walks daily on his treadmill or outside for at least 30 minutes]         Goals Addressed             This Visit's Progress    CCM Expected Outcome:  Monitor, Self-Manage and Reduce Symptoms of Diabetes       Current Barriers:  Chronic Disease Management support and education needs related to effective management of DM Financial Constraints.  Lab Results  Component Value Date   HGBA1C 7.5 (H) 05/11/2022     Planned Interventions: Provided education to patient about basic DM disease process. The patient states he is working hard and he feels his ALC is going to be below 7.0. He states he has been working hard and he feels great. Is monitoring his dietary intake; Reviewed medications with patient and discussed importance of medication adherence. The patient is taking [place] and Januvia. The patient was approved for assistance for Januvia and received a shipment of Januvia last Friday on the 15th of March. The patient wishes he could take other medications as they controlled his DM much better but he could not tolerate the side effects. Education and support given;        Reviewed prescribed diet with patient heart healthy/ADA diet. The patient states he is eating better since the holidays are over. He is also exercising. The patient states he is  trying to be mindful of his dietary habits and eat better. Counseled on importance of regular laboratory monitoring as prescribed;        Discussed plans with patient for ongoing care management follow up and provided patient with direct contact information for care management team;      Provided patient with written educational materials related to hypo and hyperglycemia and importance of correct treatment. Denies any highs or lows.;       Reviewed scheduled/upcoming provider appointments including: 08-10-2022 at 10 am;         Advised patient, providing education and rationale, to check cbg once daily and when you have symptoms of low or high blood sugar and record. States his blood sugars this am was 160 and that is because he did not get his exercise in and he ate rice last night. Education on fasting of <130 and 2 hours after eating of <180     Referral made to pharmacy team for assistance with update on Januvia. The patient has been approved for Januvia and will get it for the year 2024.   Review of patient status, including review of consultants reports, relevant laboratory and other test results, and medications completed;       Advised patient to discuss changes in DM, questions, or concerns with provider;      Screening for signs and symptoms of depression related to chronic disease state;  Assessed social determinant of health barriers;         Symptom Management: Take medications as prescribed   Attend all scheduled provider appointments Call provider office for new concerns or questions  call the Suicide and Crisis Lifeline: 988 call the [place] National Suicide Prevention Lifeline: 269-223-6910 or TTY: 236-637-0769 TTY 213-617-2814) to talk to a trained counselor call 1-800-273-TALK (toll free, 24 hour hotline) if experiencing a Mental Health or [place]  check feet daily for cuts, sores or redness trim toenails straight across manage portion size wash and dry  feet carefully every day wear comfortable, cotton socks wear comfortable, well-fitting shoes  Follow Up Plan: Telephone follow up appointment with care management team member scheduled for: 10-08-2022 at 9 am       CCM Expected Outcome:  Monitor, Self-Manage and Reduce Symptoms of Heart Failure       Current Barriers:  Knowledge Deficits related to the importance of daily weights in the effective management of HF Chronic Disease Management support and education needs related to effective management of HF Financial Constraints.  Difficulty obtaining medications  Wt Readings from Last 3 Encounters:  05/11/22 201 lb 8 oz (91.4 kg)  02/09/22 196 lb 6.4 oz (89.1 kg)  01/25/22 199 lb 9.6 oz (90.5 kg)     Planned Interventions: Basic overview and discussion of pathophysiology of Heart Failure reviewed. States his heart failure is stable. Denies any swelling in his feet of legs.  Denies any acute fluctuations in weight. Knows to call the provider for 2/3+ in one day and +5 in one week. Had his pacemaker checked on Tuesday of this week and he states that his battery is good for another year and 9 months. Education and support given.  Provided education on low sodium diet. Review and education provided. The patient is eating healthier. He was off schedule a little due to the last couple of days of driving and being out of town but he is getting back on track now. He is eating sandwiches daily but monitors his sodium intake Reviewed Heart Failure Action Plan in depth and provided written copy Assessed need for readable accurate scales in home Provided education about placing scale on hard, flat surface Advised patient to weigh each morning after emptying bladder Discussed importance of daily weight and advised patient to weigh and record daily Reviewed role of diuretics in prevention of fluid overload and management of heart failure Discussed the importance of keeping all appointments with provider.   Sees cardiologist on a regular basis. Denies any current issues. Feels his chronic conditions are stable. The patient has appointment again with pcp on 08-10-2022 at 10 am Provided patient with education about the role of exercise in the management of heart failure. The patient is walking every day for at least 30 minutes and is doing this every day.  He says he does a brisk 3 miles at times. On days that it is cold he walks of the treadmill that is in his home. Praised for good habits and keeping an exercise routine Advised patient to discuss changes in heart failure or heart health with provider Screening for signs and symptoms of depression related to chronic disease state  Assessed social determinant of health barriers Pharm D works with the patient on a regular basis. He has been approved for [facility] for 2024. He has his supply. Next shipment goes out in April of his Entresto. States compliance  Symptom Management: Take medications as prescribed   Attend  all scheduled provider appointments Call pharmacy for medication refills 3-7 days in advance of running out of medications Call provider office for new concerns or questions  call the Suicide and Crisis Lifeline: 988 call the [place] National Suicide Prevention Lifeline: 540-043-4089 or TTY: 203-096-1537 TTY 440-091-0147) to talk to a trained counselor call 1-800-273-TALK (toll free, 24 hour hotline) if experiencing a Mental Health or [place]  call office if I gain more than 2 pounds in one day or 5 pounds in one week use salt in moderation watch for swelling in feet, ankles and legs every day weigh myself daily develop a rescue plan follow rescue plan if symptoms flare-up track symptoms and what helps feel better or worse dress right for the weather, hot or cold  Follow Up Plan: Telephone follow up appointment with care management team member scheduled for: 10-08-2022 at 0900 am       CCM Expected Outcome:  Monitor,  Self-Manage, and Reduce Symptoms of Hypertension       Current Barriers:  Knowledge Deficits related to the importance of monitoring blood pressures on a regular basis to be proactive in monitoring for changes and the increased risk of heart attack and stroke with blood pressures out of range. Chronic Disease Management support and education needs related to effective management of HTN  BP Readings from Last 3 Encounters:  05/11/22 122/62  04/26/22 (!) 153/93  02/09/22 128/78  Self reported this am was 136/83, pulse of 66  Planned Interventions: Evaluation of current treatment plan related to hypertension self management and patient's adherence to plan as established by provider. The patient states that his blood pressures have been good at home. The patient denies any acute changes in HTN or heart healthy. Sees pcp and specialist on a regular basis;   Provided education to patient re: stroke prevention, s/s of heart attack and stroke; Reviewed prescribed diet heart healthy/ADA diet. Review of heart healthy/ADA diet. The patient states he is eating better and exercising daily by walking at least 30 mins a day.  He is doing well and feels great. Reviewed medications with patient and discussed importance of compliance. The patient is compliant with medications.The patient got approval for 2024 for his Entresto. He is getting that without difficulty and is thankful for the assistance. Works with the [place] [facility] on a regular basis ;  Discussed plans with patient for ongoing care management follow up and provided patient with direct contact information for care management team; Advised patient, providing education and rationale, to monitor blood pressure daily and record, calling PCP for findings outside established parameters;  Reviewed scheduled/upcoming provider appointments including: 08-10-2022 at 1000 am Advised patient to discuss changes in blood pressures or questions/concerns about heart health with  provider; Provided education on prescribed diet heart healthy/ADA diet. Review of sodium restrictions of <2000 mg daily ;  Discussed complications of poorly controlled blood pressure such as heart disease, stroke, circulatory complications, vision complications, kidney impairment, sexual dysfunction;  Screening for signs and symptoms of depression related to chronic disease state;  Assessed social determinant of health barriers;   Symptom Management: Take medications as prescribed   Attend all scheduled provider appointments Call provider office for new concerns or questions  call the Suicide and Crisis Lifeline: 988 call the [place] National Suicide Prevention Lifeline: 317-469-8807 or TTY: 779-052-3427 TTY 713-614-4034) to talk to a trained counselor call 1-800-273-TALK (toll free, 24 hour hotline) if experiencing a Mental Health or [place]  check blood pressure  weekly learn about high blood pressure keep a blood pressure log take blood pressure log to all doctor appointments call doctor for signs and symptoms of high blood pressure develop an action plan for high blood pressure keep all doctor appointments take medications for blood pressure exactly as prescribed report new symptoms to your doctor  Follow Up Plan: Telephone follow up appointment with care management team member scheduled for: 10-08-2022 at 0900 am          Plan:Telephone follow up appointment with care management team member scheduled for:  10-08-2022 at 9 am  [place], MSN, CCM RN Care Manager  Chronic Care Management Direct Number: [phone]

## 2022-08-08 DIAGNOSIS — I5020 Unspecified systolic (congestive) heart failure: Secondary | ICD-10-CM | POA: Diagnosis not present

## 2022-08-08 DIAGNOSIS — E1159 Type 2 diabetes mellitus with other circulatory complications: Secondary | ICD-10-CM

## 2022-08-08 DIAGNOSIS — Z7984 Long term (current) use of oral hypoglycemic drugs: Secondary | ICD-10-CM | POA: Diagnosis not present

## 2022-08-08 DIAGNOSIS — Z2821 Immunization not carried out because of patient refusal: Secondary | ICD-10-CM | POA: Insufficient documentation

## 2022-08-08 DIAGNOSIS — I110 Hypertensive heart disease with heart failure: Secondary | ICD-10-CM

## 2022-08-08 NOTE — Patient Instructions (Signed)

## 2022-08-10 ENCOUNTER — Encounter: Payer: Self-pay | Admitting: Nurse Practitioner

## 2022-08-10 ENCOUNTER — Telehealth: Payer: Self-pay

## 2022-08-10 ENCOUNTER — Ambulatory Visit (INDEPENDENT_AMBULATORY_CARE_PROVIDER_SITE_OTHER): Payer: Medicare Other | Admitting: Nurse Practitioner

## 2022-08-10 VITALS — BP 135/82 | HR 72 | Temp 97.6°F | Ht 67.99 in | Wt 200.0 lb

## 2022-08-10 DIAGNOSIS — G720 Drug-induced myopathy: Secondary | ICD-10-CM

## 2022-08-10 DIAGNOSIS — E6609 Other obesity due to excess calories: Secondary | ICD-10-CM

## 2022-08-10 DIAGNOSIS — M1A0720 Idiopathic chronic gout, left ankle and foot, without tophus (tophi): Secondary | ICD-10-CM | POA: Diagnosis not present

## 2022-08-10 DIAGNOSIS — E1122 Type 2 diabetes mellitus with diabetic chronic kidney disease: Secondary | ICD-10-CM

## 2022-08-10 DIAGNOSIS — I5022 Chronic systolic (congestive) heart failure: Secondary | ICD-10-CM | POA: Diagnosis not present

## 2022-08-10 DIAGNOSIS — E1159 Type 2 diabetes mellitus with other circulatory complications: Secondary | ICD-10-CM | POA: Diagnosis not present

## 2022-08-10 DIAGNOSIS — E039 Hypothyroidism, unspecified: Secondary | ICD-10-CM | POA: Diagnosis not present

## 2022-08-10 DIAGNOSIS — E785 Hyperlipidemia, unspecified: Secondary | ICD-10-CM | POA: Diagnosis not present

## 2022-08-10 DIAGNOSIS — I429 Cardiomyopathy, unspecified: Secondary | ICD-10-CM

## 2022-08-10 DIAGNOSIS — Z0000 Encounter for general adult medical examination without abnormal findings: Secondary | ICD-10-CM | POA: Diagnosis not present

## 2022-08-10 DIAGNOSIS — E1169 Type 2 diabetes mellitus with other specified complication: Secondary | ICD-10-CM | POA: Diagnosis not present

## 2022-08-10 DIAGNOSIS — E669 Obesity, unspecified: Secondary | ICD-10-CM | POA: Insufficient documentation

## 2022-08-10 DIAGNOSIS — N400 Enlarged prostate without lower urinary tract symptoms: Secondary | ICD-10-CM

## 2022-08-10 DIAGNOSIS — N183 Chronic kidney disease, stage 3 unspecified: Secondary | ICD-10-CM | POA: Diagnosis not present

## 2022-08-10 DIAGNOSIS — I152 Hypertension secondary to endocrine disorders: Secondary | ICD-10-CM

## 2022-08-10 DIAGNOSIS — Z2821 Immunization not carried out because of patient refusal: Secondary | ICD-10-CM

## 2022-08-10 DIAGNOSIS — Z95810 Presence of automatic (implantable) cardiac defibrillator: Secondary | ICD-10-CM

## 2022-08-10 LAB — MICROALBUMIN, URINE WAIVED
Creatinine, Urine Waived: 50 mg/dL (ref 10–300)
Microalb, Ur Waived: 10 mg/L (ref 0–19)

## 2022-08-10 LAB — BAYER DCA HB A1C WAIVED: HB A1C (BAYER DCA - WAIVED): 7.5 % — ABNORMAL HIGH (ref 4.8–5.6)

## 2022-08-10 MED ORDER — LEVOTHYROXINE SODIUM 50 MCG PO TABS: 50.0000 ug | ORAL_TABLET | Freq: Every day | ORAL | 4 refills | Status: DC

## 2022-08-10 MED ORDER — SPIRONOLACTONE 25 MG PO TABS: 25.0000 mg | ORAL_TABLET | Freq: Every day | ORAL | 4 refills | Status: DC

## 2022-08-10 MED ORDER — DEXLANSOPRAZOLE 60 MG PO CPDR: 60.0000 mg | DELAYED_RELEASE_CAPSULE | Freq: Every day | ORAL | 4 refills | Status: DC

## 2022-08-10 MED ORDER — CARVEDILOL 12.5 MG PO TABS: 12.5000 mg | ORAL_TABLET | Freq: Two times a day (BID) | ORAL | 4 refills | Status: DC

## 2022-08-10 MED ORDER — FEBUXOSTAT 40 MG PO TABS: 40.0000 mg | ORAL_TABLET | Freq: Every day | ORAL | 4 refills | Status: DC

## 2022-08-10 NOTE — Assessment & Plan Note (Signed)
Ongoing, 3a, however remains stable on labs over past year.  Recheck labs today. Urine ALB 18 August 2022 on check.  Consider referral to nephrology if worsening.

## 2022-08-10 NOTE — Assessment & Plan Note (Signed)
Stable, check PSA today. 

## 2022-08-10 NOTE — Assessment & Plan Note (Signed)
Chronic, stable.  BP at goal in office today.  Continue current medication regimen and collaboration with cardiology.  Recommend checking BP at home at least a few mornings a week + focus on DASH diet.  LABS: CBC, CMP, TSH, urine ALB.  Urine ALB 18 August 2022.  Return in 3 months.

## 2022-08-10 NOTE — Assessment & Plan Note (Signed)
Chronic, ongoing.  Continue current medication regimen and adjust as needed based on labs.  TSH + Free T4 today. ?

## 2022-08-10 NOTE — Assessment & Plan Note (Signed)
Chronic, stable.  Continue Uloric and recheck uric acid today. 

## 2022-08-10 NOTE — Assessment & Plan Note (Signed)
Chronic, ongoing with HF.  A1c 7.5% today, no change from previous, urine ALB 10 in April 2024.  Rybelsus & Metformin caused GI issues.  Continue current medication regimen with Farxiga and Januvia 100 MG + continue collaboration with cardiology.  Could consider Sulfonylurea at low dose if needed in future or a trial of injectable like Trulicity -- although concern for GI issues.  At this time he wishes not to add or change medications.  Recommend he check BS at home at least a few mornings a week with goal fasting <130, bring to next visit.  Continue to collaborate with CCM crew.  Return to office in 3 months.

## 2022-08-10 NOTE — Assessment & Plan Note (Signed)
Chronic, ongoing.  No current medication due to poor tolerance.   Would benefit from statin use with diabetes and heart health or consider injectable, discussed with him today.  Lipid panel up to date.  He is agreeable to trial of Rosuvastatin on 3 day or 1 day a week schedule if LDL >70 in future if needed. 

## 2022-08-10 NOTE — Assessment & Plan Note (Signed)
Chronic, stable.  Euvolemic today.  Continue current medication regimen and collaboration with cardiology.  LABS: up to date.   Recommend: - Reminded to call for an overnight weight gain of >2 pounds or a weekly weight weight of >5 pounds - not adding salt to his food and has been reading food labels. Reviewed the importance of keeping daily sodium intake to <2000mg daily  - Avoid NSAIDS 

## 2022-08-10 NOTE — Assessment & Plan Note (Signed)
Due to statin therapy, history of on low doses.  Would benefit from statin use with diabetes and heart health or consider injectable, discussed with him today.  

## 2022-08-10 NOTE — Progress Notes (Signed)
Called [employer] to discuss why this patient was denied help in [place]. Representative stated that they done a soft credit check and the patient income was over the eligibility amount and was denied. She stated that the patient is able to re-enroll on tomorrow for assistance, if he is denied then he can send in his 45 tax form to then see if is eligible.  I will prefill and application and send to the patient to be complete and returned back to [place].  [name]

## 2022-08-10 NOTE — Assessment & Plan Note (Signed)
Ongoing, stable, followed by cardiology.  Continue this collaboration. 

## 2022-08-10 NOTE — Assessment & Plan Note (Signed)
Continue collaboration with cardiology and regular checks. 

## 2022-08-10 NOTE — Assessment & Plan Note (Signed)
Refuses all vaccinations and colonoscopy.

## 2022-08-10 NOTE — Assessment & Plan Note (Signed)
BMI 30.42.  Recommended eating smaller high protein, low fat meals more frequently and exercising 30 mins a day 5 times a week with a goal of 10-15lb weight loss in the next 3 months. Patient voiced their understanding and motivation to adhere to these recommendations.  

## 2022-08-10 NOTE — Progress Notes (Addendum)
BP 135/82   Pulse 72   Temp 97.6 F (36.4 C) (Oral)   Ht 5' 7.99" (1.727 m)   Wt 200 lb (90.7 kg)   SpO2 97%   BMI 30.42 kg/m    Subjective:    Patient ID: Ryan [name], male    DOB: [birth year], 72 y.o.   [name]: [id number]  HPI: ETSEL SHED is a 72 y.o. male presenting on 08/10/2022 for comprehensive medical examination. Current medical complaints include:none  He currently lives with: wife Interim Problems from his last visit: no  DIABETES A1c at last visit was 7.5%, trend up due to holidays.  Takes Wilder Glade (gets via assistance) + Januvia 100 MG daily.  Had intolerance to Metformin and Rybelsus. Prefers no injectables.  Walking every day for 30 minutes since before Christmas + has cut back on sugar.   Hypoglycemic episodes:no Polydipsia/polyuria: no Visual disturbance: no Chest pain: no Paresthesias: no Glucose Monitoring: no             Accucheck frequency: occasional             Fasting glucose: 140 to 160             Post prandial:             Evening:             Before meals: Taking Insulin?: no             Long acting insulin:             Short acting insulin: Blood Pressure Monitoring: daily Retinal Examination: Up to Date  Foot Exam: Up to Date Pneumovax: refuses Influenza:  refused Aspirin: no    HYPERTENSION / HYPERLIPIDEMIA/HF Followed by cardiology, Dr. Caryl Comes, last saw 02/09/22 -- discussed triglycerides and cholesterol at visit, he recommended continue current diet focus.  Has cardio-defib implantable (replacement last in January 2012) last check 04/28/22.  Has 1 year and 9 months on battery left.  Continues on Aldactone, Entresto (gets via assistance), Coreg.  No statin, cardiology took him off of this years ago.  Pravastatin in past with poor response -- myalgia.   Satisfied with current treatment? yes Duration of hypertension: chronic BP monitoring frequency: a few times a week BP range: 120/80 range BP medication side effects: no Duration of  hyperlipidemia: chronic Medication compliance: good compliance Aspirin: no Recent stressors: no Recurrent headaches: no Visual changes: no Palpitations: no Dyspnea: no Chest pain: no Lower extremity edema: no Dizzy/lightheaded: no  The 10-year ASCVD risk score (Arnett DK, et al., 2019) is: 43.6%   Values used to calculate the score:     Age: 20 years     Sex: Male     Is Non-Hispanic African American: No     Diabetic: Yes     Tobacco smoker: No     Systolic Blood Pressure: [id number] mmHg     Is BP treated: Yes     HDL Cholesterol: 32 mg/dL     Total Cholesterol: 153 mg/dL  CHRONIC KIDNEY DISEASE Stable since December 2022. Continues Uloric for history of gout, no recent flares. CKD status: stable Medications renally dose: yes Previous renal evaluation: no Pneumovax:  refuses Influenza Vaccine:  refuses   HYPOTHYROIDISM Continues on Levothyroxine 50 MCG daily.  Recent labs stable. Thyroid control status:stable Satisfied with current treatment? yes Medication side effects: no Medication compliance: good compliance Etiology of hypothyroidism: unknown Recent dose adjustment:no Fatigue: no Cold intolerance: no Heat intolerance: no Weight gain:  no Weight loss: no Constipation: no Diarrhea/loose stools: no Palpitations: no Lower extremity edema: no Anxiety/depressed mood: no   Functional Status Survey: Is the patient deaf or have difficulty hearing?: No Does the patient have difficulty seeing, even when wearing glasses/contacts?: No Does the patient have difficulty concentrating, remembering, or making decisions?: No Does the patient have difficulty walking or climbing stairs?: No Does the patient have difficulty dressing or bathing?: No Does the patient have difficulty doing errands alone such as visiting a doctor's office or shopping?: No     02/05/2022   10:10 AM 04/09/2022    9:29 AM 05/28/2022    9:31 AM 07/30/2022    9:27 AM 08/10/2022   10:02 AM  [place]  in the past year? 1 0 0 0 0  Was there an injury with Fall? 0 0 0 0 0  Fall Risk Category Calculator 1 0 0 0 0  Fall Risk Category (Retired) Low Low     (RETIRED) Patient Fall Risk Level Low fall risk Low fall risk     Patient at Risk for Falls Due to No Fall Risks No Fall Risks  No Fall Risks No Fall Risks  Fall risk Follow up Falls evaluation completed Falls evaluation completed;Education provided  Falls evaluation completed;Education provided        08/10/2022   10:03 AM 04/09/2022    9:25 AM 02/05/2022   10:09 AM 01/25/2022   10:19 AM 10/23/2021    8:14 AM  Depression screen PHQ 2/9  Decreased Interest 0 0 0 0 0  Down, Depressed, Hopeless 0 0 0 0 0  PHQ - 2 Score 0 0 0 0 0  Altered sleeping 0  0 0 0  Tired, decreased energy 0  0 0 0  Change in appetite 0  0 0 0  Feeling bad or failure about yourself  0  0 0 0  Trouble concentrating 0  0 0 0  Moving slowly or fidgety/restless 0  0 0 0  Suicidal thoughts 0  0 0 0  PHQ-9 Score 0  0 0 0  Difficult doing work/chores Not difficult at all  Not difficult at all Not difficult at all Not difficult at all       08/10/2022   10:03 AM 01/25/2022   10:20 AM 10/23/2021    8:15 AM 08/14/2021   10:11 AM  GAD 7 : Generalized Anxiety Score  Nervous, Anxious, on Edge 0 0 0 0  Control/stop worrying 0 0 0 0  Worry too much - different things 0 0 0 0  Trouble relaxing 0 0 0 0  Restless 0 0 0 0  Easily annoyed or irritable 0 0 0 0  Afraid - awful might happen 0 0 0 0  Total GAD 7 Score 0 0 0 0  Anxiety Difficulty Not difficult at all Not difficult at all Not difficult at all Not difficult at all   Advanced Directives Does patient have a HCPOA?    yes If yes, name and contact information:  Does patient have a living will or MOST form?  yes  Past Medical History:  Past Medical History:  Diagnosis Date   AICD (automatic cardioverter/defibrillator) present    Anxiety    Complete heart block    Depression    Diabetes mellitus, type 2    Dual  ICD (implantable cardiac defibrillator-Medtronic    a. 6949 implanted originally s/p 6947 lead & generator change out 05/2010; b. upgraded to MDT  CRT-D in 07/2016   GERD (gastroesophageal reflux disease)    Gout    History of kidney stones    Hypertension    IBS (irritable bowel syndrome)    Kidney stones    Nonischemic cardiomyopathy    a. Echo 07/2012 EF 35-40%, severe HK of the mid-distalanterior myocardium w/ AK of the distalinferior myocardium, GR1DD, mild MR, mild to mod dilated LA; b/ echo 06/2016 EF 35-40%, severe HK of the mid-apicalanteroseptal and anterior myocardium and severe HK of the apicalinferior myocardium along with AK of the apical myocardium, GR1DD, calcified mitral annulus, mildly dilated left atrium   Paroxysmal atrial fibrillation    a. CHADS2VASc => 3 (CHF, HTN, age x 1); b. on ASA only   Presence of permanent cardiac pacemaker    Syncope     Surgical History:  Past Surgical History:  Procedure Laterality Date   BIV UPGRADE N/A 07/21/2016   Procedure: BiV Upgrade;  Surgeon: Deboraha Sprang, MD;  Location: [facility];  Service: Cardiovascular;  Laterality: N/A;   CARDIAC DEFIBRILLATOR PLACEMENT     DIRECT LARYNGOSCOPY Right 07/14/2017   Procedure: MICRODIRECT LARYNGOSCOPY WITH EXCISION OF RIGHT VOCAL CORD LESION WITH MICRO FLAP TECHNIQUE;  Surgeon: Carloyn Manner, MD;  Location: [facility];  Service: ENT;  Laterality: Right;   LARYNGOSCOPY Right 01/06/2017   Procedure: LARYNGOSCOPY WITH EXCISION OF RIGHT VOCAL CORD LESION;  Surgeon: Carloyn Manner, MD;  Location: [facility];  Service: ENT;  Laterality: Right;   LITHOTRIPSY     UPPER ENDOSCOPY W/ ESOPHAGEAL MANOMETRY      Medications:  Current Outpatient Medications on File Prior to Visit  Medication Sig   Cholecalciferol (VITAMIN D3 SUPER STRENGTH) 50 MCG (2000 UT) CAPS Take 2,000 Units by mouth daily.   dapagliflozin propanediol (FARXIGA) 10 MG TABS tablet Take 10 mg by mouth daily.   sacubitril-valsartan  (ENTRESTO) 24-26 MG Take 1 tablet by mouth 2 (two) times daily.   sitaGLIPtin (JANUVIA) 100 MG tablet Take 1 tablet (100 mg total) by mouth daily.   No current facility-administered medications on file prior to visit.    Allergies:  Allergies  Allergen Reactions   Ace Inhibitors     Muscle aches    Crestor [Rosuvastatin Calcium] Other (See Comments)    Muscle aches    Social History:  Social History   Socioeconomic History   Marital status: Married    Spouse name: Not on file   Number of children: 1   Years of education: 12   Highest education level: 12th grade  Occupational History   Occupation: Self Employed    Employer: [employer]  Tobacco Use   Smoking status: Former    Types: Cigarettes    Quit date: 12/30/1989    Years since quitting: 32.6   Smokeless tobacco: Never  Vaping Use   Vaping Use: Never used  Substance and Sexual Activity   Alcohol use: Yes    Alcohol/week: 7.0 standard drinks of alcohol    Types: 7 Cans of beer per week   Drug use: No   Sexual activity: Yes  Other Topics Concern   Not on file  Social History Narrative   Not on file   Social Determinants of Health   Financial Resource Strain: Medium Risk (04/09/2022)   Overall Financial Resource Strain (CARDIA)    Difficulty of Paying Living Expenses: Somewhat hard  Food Insecurity: No Food Insecurity (04/09/2022)   Hunger Vital Sign    Worried About Running Out of  Food in the Last Year: Never true    Protection in the Last Year: Never true  Transportation Needs: No Transportation Needs (04/09/2022)   PRAPARE - Hydrologist (Medical): No    Lack of Transportation (Non-Medical): No  Physical Activity: Sufficiently Active (07/30/2022)   Exercise Vital Sign    Days of Exercise per Week: 6 days    Minutes of Exercise per Session: 30 min  Stress: No Stress Concern Present (04/09/2022)   [place]    Feeling of Stress : Not at all  Social Connections: Moderately Integrated (04/09/2022)   Social Connection and Isolation Panel [NHANES]    Frequency of Communication with Friends and Family: Three times a week    Frequency of Social Gatherings with Friends and Family: Three times a week    Attends Religious Services: Never    Active Member of Clubs or Organizations: Yes    Attends Archivist Meetings: More than 4 times per year    Marital Status: Married  Human resources officer Violence: Not At Risk (04/09/2022)   Humiliation, Afraid, Rape, and Kick questionnaire    Fear of Current or Ex-Partner: No    Emotionally Abused: No    Physically Abused: No    Sexually Abused: No   Social History   Tobacco Use  Smoking Status Former   Types: Cigarettes   Quit date: 12/30/1989   Years since quitting: 32.6  Smokeless Tobacco Never   Social History   Substance and Sexual Activity  Alcohol Use Yes   Alcohol/week: 7.0 standard drinks of alcohol   Types: 7 Cans of beer per week    Family History:  Family History  Problem Relation Age of Onset   Breast cancer Mother    Diabetes Mother    Heart disease Mother    Cancer Mother        breast   Lung cancer Father    Diabetes Father    Cancer Father        lung   Diabetes Sister     Past medical history, surgical history, medications, allergies, family history and social history reviewed with patient today and changes made to appropriate areas of the chart.   ROS All other ROS negative except what is listed above and in the HPI.      Objective:    BP 135/82   Pulse 72   Temp 97.6 F (36.4 C) (Oral)   Ht 5' 7.99" (1.727 m)   Wt 200 lb (90.7 kg)   SpO2 97%   BMI 30.42 kg/m   Wt Readings from Last 3 Encounters:  08/10/22 200 lb (90.7 kg)  05/11/22 201 lb 8 oz (91.4 kg)  02/09/22 196 lb 6.4 oz (89.1 kg)    Physical Exam Vitals and nursing note reviewed.  Constitutional:      General: He is awake. He  is not in acute distress.    Appearance: He is well-developed and well-groomed. He is obese. He is not ill-appearing or toxic-appearing.  HENT:     Head: Normocephalic and atraumatic.     Right Ear: Hearing, tympanic membrane, ear canal and external ear normal. No drainage.     Left Ear: Hearing, tympanic membrane, ear canal and external ear normal. No drainage.     Nose: Nose normal.     Mouth/Throat:     Pharynx: Uvula midline.  Eyes:     General:  Lids are normal.        Right eye: No discharge.        Left eye: No discharge.     Extraocular Movements: Extraocular movements intact.     Conjunctiva/sclera: Conjunctivae normal.     Pupils: Pupils are equal, round, and reactive to light.     Visual Fields: Right eye visual fields normal and left eye visual fields normal.  Neck:     Thyroid: No thyromegaly.     Vascular: No carotid bruit or JVD.     Trachea: Trachea normal.  Cardiovascular:     Rate and Rhythm: Normal rate and regular rhythm.     Heart sounds: Normal heart sounds, S1 normal and S2 normal. No murmur heard.    No gallop.  Pulmonary:     Effort: Pulmonary effort is normal. No accessory muscle usage or respiratory distress.     Breath sounds: Normal breath sounds.  Abdominal:     General: Bowel sounds are normal.     Palpations: Abdomen is soft. There is no hepatomegaly or splenomegaly.     Tenderness: There is no abdominal tenderness.  Musculoskeletal:        General: Normal range of motion.     Cervical back: Normal range of motion and neck supple.     Right lower leg: No edema.     Left lower leg: No edema.  Lymphadenopathy:     Head:     Right side of head: No submental, submandibular, tonsillar, preauricular or posterior auricular adenopathy.     Left side of head: No submental, submandibular, tonsillar, preauricular or posterior auricular adenopathy.     Cervical: No cervical adenopathy.  Skin:    General: Skin is warm and dry.     Capillary Refill:  Capillary refill takes less than 2 seconds.     Findings: No rash.  Neurological:     Mental Status: He is alert and oriented to person, place, and time.     Gait: Gait is intact.     Deep Tendon Reflexes: Reflexes are normal and symmetric.     Reflex Scores:      Brachioradialis reflexes are 2+ on the right side and 2+ on the left side.      Patellar reflexes are 2+ on the right side and 2+ on the left side. Psychiatric:        Attention and Perception: Attention normal.        Mood and Affect: Mood normal.        Speech: Speech normal.        Behavior: Behavior normal. Behavior is cooperative.        Thought Content: Thought content normal.        Cognition and Memory: Cognition normal.       02/05/2022   10:12 AM 02/04/2021    5:13 PM 05/29/2019    8:53 AM 04/25/2017    8:30 AM  6CIT Screen  What Year? 0 points 0 points 0 points 0 points  What month? 0 points 0 points 0 points 0 points  What time? 0 points 0 points 0 points 0 points  Count back from 20 0 points 0 points 0 points 0 points  Months in reverse 0 points 0 points 0 points 0 points  Repeat phrase 0 points 0 points 0 points 0 points  Total Score 0 points 0 points 0 points 0 points   Diabetic Foot Exam - Simple   Simple Foot Form Visual  Inspection No deformities, no ulcerations, no other skin breakdown bilaterally: Yes Sensation Testing Intact to touch and monofilament testing bilaterally: Yes Pulse Check Posterior Tibialis and Dorsalis pulse intact bilaterally: Yes Comments     Results for orders placed or performed in visit on 07/28/22  CUP PACEART REMOTE DEVICE CHECK  Result Value Ref Range   Date Time Interrogation Session [id number]    Pulse Generator Manufacturer MERM    Pulse Gen Model [id number] Claria MRI Quad CRT-D    Pulse Gen Serial Number [id number]    Clinic Name [facility] Pulse Generator Type Cardiac Resynch Therapy Defibulator    Implantable Pulse Generator Implant Date  [id number]    Implantable Lead Manufacturer [facility]    Implantable Lead Model 1458Q Quartet    Implantable Lead Serial Number [id number]    Implantable Lead Implant Date [id number]    Implantable Lead Location Detail 1 UNKNOWN    Implantable Lead Location [id number]    Implantable Lead Connection Status [id number]    Implantable Lead Manufacturer [facility]    Implantable Lead Model 774 070 3057 Sprint Quattro Secure    Implantable Lead Serial Number [id number]    Implantable Lead Implant Date [id number]    Implantable Lead Location [id number]    Implantable Lead Connection Status [id number]    Implantable Lead Manufacturer MERM    Implantable Lead Model 5076 CapSureFix Novus    Implantable Lead Serial Number [id number]    Implantable Lead Implant Date [id number]    Implantable Lead Location [id number]    Implantable Lead Connection Status [id number]    Lead Channel Setting Sensing Sensitivity 0.3 mV   Lead Channel Setting Pacing Amplitude 1.25 V   Lead Channel Setting Pacing Pulse Width 0.4 ms   Lead Channel Setting Pacing Amplitude 1.5 V   Lead Channel Setting Pacing Pulse Width 0.4 ms   Lead Channel Setting Pacing Amplitude 2 V   Lead Channel Setting Pacing Capture Mode Monitor Capture    Zone Setting Status Active    Zone Setting Status Inactive    Zone Setting Status Inactive    Zone Setting Status [id number]    Zone Setting Status [phone]    Lead Channel Impedance Value 418 ohm   Lead Channel Sensing Intrinsic Amplitude 1.25 mV   Lead Channel Sensing Intrinsic Amplitude 1.25 mV   Lead Channel Pacing Threshold Amplitude 0.625 V   Lead Channel Pacing Threshold Pulse Width 0.4 ms   Lead Channel Impedance Value 646 ohm   Lead Channel Impedance Value 589 ohm   Lead Channel Pacing Threshold Amplitude 0.625 V   Lead Channel Pacing Threshold Pulse Width 0.4 ms   HighPow Impedance 53 ohm   HighPow Impedance 72 ohm   Lead Channel Impedance Value 760 ohm   Lead Channel Impedance Value 836 ohm   Lead Channel Impedance Value 931 ohm    Lead Channel Impedance Value 722 ohm   Lead Channel Impedance Value 779 ohm   Lead Channel Impedance Value 779 ohm   Lead Channel Impedance Value 513 ohm   Lead Channel Impedance Value 418 ohm   Lead Channel Impedance Value 475 ohm   Lead Channel Impedance Value 513 ohm   Lead Channel Impedance Value 230.327    Lead Channel Impedance Value 246.635    Lead Channel Impedance Value 256.5 ohm   Lead Channel Impedance Value 230.327    Lead Channel Impedance Value 246.635    Lead Channel Pacing Threshold Amplitude 1 V   Lead Channel Pacing Threshold Pulse  Width 0.4 ms   Battery Status OK    Battery Remaining Longevity 21 mo   Battery Voltage 2.91 V   Brady Statistic RA Percent Paced 3.7 %   Brady Statistic RV Percent Paced 99.98 %   Brady Statistic AP VP Percent 3.7 %   Brady Statistic AS VP Percent 96.29 %   Brady Statistic AP VS Percent 0.01 %   Brady Statistic AS VS Percent 0 %      Assessment & Plan:   Problem List Items Addressed This Visit       Cardiovascular and Mediastinum   Hypertension associated with diabetes    Chronic, stable.  BP at goal in office today.  Continue current medication regimen and collaboration with cardiology.  Recommend checking BP at home at least a few mornings a week + focus on DASH diet.  LABS: CBC, CMP, TSH, urine ALB.  Urine ALB 18 August 2022.  Return in 3 months.      Relevant Medications   spironolactone (ALDACTONE) 25 MG tablet   carvedilol (COREG) 12.5 MG tablet   Other Relevant Orders   Bayer DCA Hb A1c Waived   Microalbumin, Urine Waived   Comprehensive metabolic panel   CBC with Differential/Platelet   Idiopathic cardiomyopathy    Ongoing, stable, followed by cardiology.  Continue this collaboration.      Relevant Medications   spironolactone (ALDACTONE) 25 MG tablet   dexlansoprazole (DEXILANT) 60 MG capsule   carvedilol (COREG) 12.5 MG tablet   Other Relevant Orders   Comprehensive metabolic panel   Lipid Panel w/o  Chol/HDL Ratio   Systolic CHF    Chronic, stable.  Euvolemic today.  Continue current medication regimen and collaboration with cardiology.  LABS: up to date.   Recommend: - Reminded to call for an overnight weight gain of >2 pounds or a weekly weight weight of >5 pounds - not adding salt to his food and has been reading food labels. Reviewed the importance of keeping daily sodium intake to 2000mg  daily  - Avoid NSAIDS      Relevant Medications   spironolactone (ALDACTONE) 25 MG tablet   carvedilol (COREG) 12.5 MG tablet   Other Relevant Orders   Comprehensive metabolic panel   Lipid Panel w/o Chol/HDL Ratio   Type 2 diabetes mellitus with cardiac complication - Primary    Chronic, ongoing with HF.  A1c 7.5% today, no change from previous, urine ALB 10 in April 2024.  Rybelsus & Metformin caused GI issues.  Continue current medication regimen with Farxiga and Januvia 100 MG + continue collaboration with cardiology.  Could consider Sulfonylurea at low dose if needed in future or a trial of injectable like Trulicity -- although concern for GI issues.  At this time he wishes not to add or change medications.  Recommend he check BS at home at least a few mornings a week with goal fasting <130, bring to next visit.  Continue to collaborate with CCM crew.  Return to office in 3 months.      Relevant Medications   spironolactone (ALDACTONE) 25 MG tablet   carvedilol (COREG) 12.5 MG tablet   Other Relevant Orders   Bayer DCA Hb A1c Waived   Microalbumin, Urine Waived   Comprehensive metabolic panel     Endocrine   CKD stage 3 due to type 2 diabetes mellitus    Ongoing, 3a, however remains stable on labs over past year.  Recheck labs today. Urine ALB 18 August 2022 on check.  Consider referral to nephrology if worsening.      Relevant Orders   Bayer DCA Hb A1c Waived   Microalbumin, Urine Waived   Comprehensive metabolic panel   Hyperlipidemia associated with type 2 diabetes mellitus     Chronic, ongoing.  No current medication due to poor tolerance.   Would benefit from statin use with diabetes and heart health or consider injectable, discussed with him today.  Lipid panel up to date.  He is agreeable to trial of Rosuvastatin on 3 day or 1 day a week schedule if LDL >70 in future if needed.      Relevant Medications   spironolactone (ALDACTONE) 25 MG tablet   carvedilol (COREG) 12.5 MG tablet   Other Relevant Orders   Bayer DCA Hb A1c Waived   Comprehensive metabolic panel   Lipid Panel w/o Chol/HDL Ratio   Hypothyroidism    Chronic, ongoing.  Continue current medication regimen and adjust as needed based on labs.  TSH + Free T4 today.      Relevant Medications   levothyroxine (SYNTHROID) 50 MCG tablet   carvedilol (COREG) 12.5 MG tablet   Other Relevant Orders   TSH   T4, free     Musculoskeletal and Integument   Drug-induced myopathy    Due to statin therapy, history of on low doses.  Would benefit from statin use with diabetes and heart health or consider injectable, discussed with him today.         Genitourinary   BPH (benign prostatic hyperplasia)    Stable, check PSA today.      Relevant Orders   PSA     Other   Biventricular implantable cardioverter-defibrillator in situ    Continue collaboration with cardiology and regular checks.      Gout    Chronic, stable.  Continue Uloric and recheck uric acid today.      Relevant Medications   febuxostat (ULORIC) 40 MG tablet   Other Relevant Orders   Uric acid   No vaccination-pt refuse    Refuses all vaccinations and colonoscopy.      Obesity    BMI 30.42.  Recommended eating smaller high protein, low fat meals more frequently and exercising 30 mins a day 5 times a week with a goal of 10-15lb weight loss in the next 3 months. Patient voiced their understanding and motivation to adhere to these recommendations.       Other Visit Diagnoses     Encounter for annual physical exam       Annual  physical today with labs and health maintenance reviewed, discussed with patient.       Discussed aspirin prophylaxis for myocardial infarction prevention and decision was it was not indicated  LABORATORY TESTING:  Health maintenance labs ordered today as discussed above.   The natural history of prostate cancer and ongoing controversy regarding screening and potential treatment outcomes of prostate cancer has been discussed with the patient. The meaning of a false positive PSA and a false negative PSA has been discussed. He indicates understanding of the limitations of this screening test and wishes to proceed with screening PSA testing.  Is aware not recommended over 70, but wants to continue screening.   IMMUNIZATIONS:   - Tdap: Tetanus vaccination status reviewed: Refused. - Influenza: Refused - Pneumovax: Refused - Prevnar: Refused - Zostavax vaccine: Refused  SCREENING: - Colonoscopy: Refused  Discussed with patient purpose of the colonoscopy is to detect colon cancer at curable precancerous or early stages   -  AAA Screening: Not applicable  -Hearing Test: Not applicable  -Spirometry: Not applicable   PATIENT COUNSELING:    Sexuality: Discussed sexually transmitted diseases, partner selection, use of condoms, avoidance of unintended pregnancy  and contraceptive alternatives.   Advised to avoid cigarette smoking.  I discussed with the patient that most people either abstain from alcohol or drink within safe limits (<=14/week and <=4 drinks/occasion for males, <=7/weeks and <= 3 drinks/occasion for females) and that the risk for alcohol disorders and other health effects rises proportionally with the number of drinks per week and how often a drinker exceeds daily limits.  Discussed cessation/primary prevention of drug use and availability of treatment for abuse.   Diet: Encouraged to adjust caloric intake to maintain  or achieve ideal body weight, to reduce intake of dietary  saturated fat and total fat, to limit sodium intake by avoiding high sodium foods and not adding table salt, and to maintain adequate dietary potassium and calcium preferably from fresh fruits, vegetables, and low-fat dairy products.    Stressed the importance of regular exercise  Injury prevention: Discussed safety belts, safety helmets, smoke detector, smoking near bedding or upholstery.   Dental health: Discussed importance of regular tooth brushing, flossing, and dental visits.   Follow up plan: NEXT PREVENTATIVE PHYSICAL DUE IN 1 YEAR. Return in about 3 months (around 11/09/2022) for T2DM, HTN/HLD.

## 2022-08-11 LAB — COMPREHENSIVE METABOLIC PANEL
ALT: 37 IU/L (ref 0–44)
AST: 21 IU/L (ref 0–40)
Albumin/Globulin Ratio: 2.1 (ref 1.2–2.2)
Albumin: 4.6 g/dL (ref 3.8–4.8)
Alkaline Phosphatase: 45 IU/L (ref 44–121)
BUN/Creatinine Ratio: 14 (ref 10–24)
BUN: 21 mg/dL (ref 8–27)
Bilirubin Total: 0.6 mg/dL (ref 0.0–1.2)
CO2: 22 mmol/L (ref 20–29)
Calcium: 9.6 mg/dL (ref 8.6–10.2)
Chloride: 102 mmol/L (ref 96–106)
Creatinine, Ser: 1.45 mg/dL — ABNORMAL HIGH (ref 0.76–1.27)
Globulin, Total: 2.2 g/dL (ref 1.5–4.5)
Glucose: 167 mg/dL — ABNORMAL HIGH (ref 70–99)
Potassium: 4.5 mmol/L (ref 3.5–5.2)
Sodium: 137 mmol/L (ref 134–144)
Total Protein: 6.8 g/dL (ref 6.0–8.5)
eGFR: 52 mL/min/{1.73_m2} — ABNORMAL LOW (ref >59)

## 2022-08-11 LAB — CBC WITH DIFFERENTIAL/PLATELET
Basophils Absolute: 0.1 10*3/uL (ref 0.0–0.2)
Basos: 1 %
EOS (ABSOLUTE): 0.2 10*3/uL (ref 0.0–0.4)
Eos: 4 %
Hematocrit: 46.1 % (ref 37.5–51.0)
Hemoglobin: 15.6 g/dL (ref 13.0–17.7)
Immature Grans (Abs): 0 10*3/uL (ref 0.0–0.1)
Immature Granulocytes: 0 %
Lymphocytes Absolute: 1.6 10*3/uL (ref 0.7–3.1)
Lymphs: 30 %
MCH: 31 pg (ref 26.6–33.0)
MCHC: 33.8 g/dL (ref 31.5–35.7)
MCV: 92 fL (ref 79–97)
Monocytes Absolute: 0.5 10*3/uL (ref 0.1–0.9)
Monocytes: 9 %
Neutrophils Absolute: 3.1 10*3/uL (ref 1.4–7.0)
Neutrophils: 56 %
Platelets: 169 10*3/uL (ref 150–450)
RBC: 5.04 x10E6/uL (ref 4.14–5.80)
RDW: 12.8 % (ref 11.6–15.4)
WBC: 5.5 10*3/uL (ref 3.4–10.8)

## 2022-08-11 LAB — URIC ACID: Uric Acid: 3.2 mg/dL — ABNORMAL LOW (ref 3.8–8.4)

## 2022-08-11 LAB — LIPID PANEL W/O CHOL/HDL RATIO
Cholesterol, Total: 147 mg/dL (ref 100–199)
HDL: 30 mg/dL — ABNORMAL LOW (ref >39)
LDL Chol Calc (NIH): 62 mg/dL (ref 0–99)
Triglycerides: 349 mg/dL — ABNORMAL HIGH (ref 0–149)
VLDL Cholesterol Cal: 55 mg/dL — ABNORMAL HIGH (ref 5–40)

## 2022-08-11 LAB — T4, FREE: Free T4: 1.15 ng/dL (ref 0.82–1.77)

## 2022-08-11 LAB — PSA: Prostate Specific Ag, Serum: 1.1 ng/mL (ref 0.0–4.0)

## 2022-08-11 LAB — TSH: TSH: 4.06 u[IU]/mL (ref 0.450–4.500)

## 2022-08-11 NOTE — Progress Notes (Signed)
Contacted via [place] morning Ol[place], your labs have returned: - Kidney function this check shows some mild decline again, creatinine and eGFR, we will continue to monitor this as it waxes and wanes.  Ensure good hydration at home.  Liver function, AST and ALT, are normal. - Cholesterol levels show LDL stable, triglycerides are a little elevated still -- I recommend taking Omega 3 or some fish oil daily to help lower these + focus on healthy diet. - Remainder of labs look great!!  Any questions? Keep being awesome!!  Thank you for allowing me to participate in your care.  I appreciate you. Kindest regards, Nakia Remmers

## 2022-08-11 NOTE — Addendum Note (Signed)
Addended by: [name] T on: 08/11/2022 03:25 PM   Modules accepted: Level of Service

## 2022-08-30 ENCOUNTER — Ambulatory Visit: Payer: Medicare Other

## 2022-08-30 NOTE — Patient Outreach (Addendum)
Care Management & Coordination Services Pharmacy Note  08/30/2022 Name:  Ryan [name] [name]:  [id number] DOB:  Jan 13, 1951  Summary: -Very pleasant 72 year old male presents for f/u CCM visit. Worked for 47 years. 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 Lived outside [place]. Lives on farm, loves to travel and camp. Just got back from [place] from a camping trip in October 2023. -Family of 4 siblins. -Nephew is a Teacher, early years/pre, [employer] trained. Patient is a Nutritional therapist -Loves cars, Therapist, nutritional. Goes to [place], [place] every few years for a car show. Has a burgundy 07 Corvette. Doesn't like the newer ones.   Recommendations/Changes made from today's visit: -Patient on Feboxostat for Gout. Was on Allopurinol but was changed because, "This new one is good for your kidneys." Change happened 15 years ago and patient had no problems on Allopurinol. Will co-sign PCP on chart to ask to change back to Allopurinol due to increased risk of CV events on Febuxostat.  -Patient unable to tolerate statin. Recommend trial of alternative like Nexlitol/Repatha. If PCP agrees, will have to start PAP for this, patient will be unable to afford -Patient denied for Farxiga PAP. Will re-apply    Subjective: Ryan [name] is an 72 y.o. year old male who is a primary patient of Cannady, [name], NP.  The care coordination team was consulted for assistance with disease management and care coordination needs.    Engaged with patient by telephone for follow up visit.  Recent office visits:  05/11/22-[name] T. Harvest Dark, NP (PCP) Seen for general follow up visit. Labs ordered. Follow up in 3 months.    Recent consult visits:  04/26/22-David Sol Blazing, MD (Dermatology) Seen for annual exam. Follow up in 1 year.    Hospital visits:  None in previous 6 months   Objective:  Lab Results  Component Value Date   CREATININE 1.45 (H) 08/10/2022   BUN 21 08/10/2022   EGFR 52 (L)  08/10/2022   GFRNONAA 51 (L) 06/04/2020   GFRAA 59 (L) 06/04/2020   NA 137 08/10/2022   K 4.5 08/10/2022   CALCIUM 9.6 08/10/2022   CO2 22 08/10/2022   GLUCOSE 167 (H) 08/10/2022    Lab Results  Component Value Date/Time   HGBA1C 7.5 (H) 08/10/2022 10:02 AM   HGBA1C 7.5 (H) 05/11/2022 09:16 AM   HGBA1C 6.9 11/08/2017 12:00 AM   HGBA1C 6.9 11/08/2017 12:00 AM   MICROALBUR 10 08/10/2022 10:02 AM   MICROALBUR 10 05/11/2022 09:16 AM    Last diabetic Eye exam:  Lab Results  Component Value Date/Time   HMDIABEYEEXA No Retinopathy 09/22/2021 12:00 AM    Last diabetic Foot exam: No results found for: "HMDIABFOOTEX"   Lab Results  Component Value Date   CHOL 147 08/10/2022   HDL 30 (L) 08/10/2022   LDLCALC 62 08/10/2022   TRIG 349 (H) 08/10/2022   CHOLHDL 4.0 05/24/2018       Latest Ref Rng & Units 08/10/2022   10:04 AM 07/10/2021    8:16 AM 05/29/2019   10:05 AM  Hepatic Function  Total Protein 6.0 - 8.5 g/dL 6.8  7.0  7.2   Albumin 3.8 - 4.8 g/dL 4.6  4.8  4.8   AST 0 - 40 IU/L 21  16  19    ALT 0 - 44 IU/L 37  30  24   Alk Phosphatase 44 - 121 IU/L 45  46  49   Total Bilirubin  0.0 - 1.2 mg/dL 0.6  0.6  0.9     Lab Results  Component Value Date/Time   TSH 4.060 08/10/2022 10:04 AM   TSH 4.400 08/14/2021 10:42 AM   FREET4 1.15 08/10/2022 10:04 AM   FREET4 1.20 08/14/2021 10:42 AM       Latest Ref Rng & Units 08/10/2022   10:04 AM 07/10/2021    8:16 AM 05/29/2019   10:05 AM  CBC  WBC 3.4 - 10.8 x10E3/uL 5.5  5.9  7.1   Hemoglobin 13.0 - 17.7 g/dL 16.1  09.6  04.5   Hematocrit 37.5 - 51.0 % 46.1  48.2  47.4   Platelets 150 - 450 x10E3/uL 169  158  197     Lab Results  Component Value Date/Time   VD25OH 41.8 07/10/2021 08:16 AM    Clinical ASCVD: Yes  The 10-year ASCVD risk score (Arnett DK, et al., 2019) is: 43.9%   Values used to calculate the score:     Age: 78 years     Sex: Male     Is Non-Hispanic African American: No     Diabetic: Yes     Tobacco  smoker: No     Systolic Blood Pressure: 135 mmHg     Is BP treated: Yes     HDL Cholesterol: 30 mg/dL     Total Cholesterol: 147 mg/dL    Other: ([id number] if Afib, MMRC or CAT for COPD, ACT, DEXA)     08/10/2022   10:03 AM 04/09/2022    9:25 AM 02/05/2022   10:09 AM  Depression screen PHQ 2/9  Decreased Interest 0 0 0  Down, Depressed, Hopeless 0 0 0  PHQ - 2 Score 0 0 0  Altered sleeping 0  0  Tired, decreased energy 0  0  Change in appetite 0  0  Feeling bad or failure about yourself  0  0  Trouble concentrating 0  0  Moving slowly or fidgety/restless 0  0  Suicidal thoughts 0  0  PHQ-9 Score 0  0  Difficult doing work/chores Not difficult at all  Not difficult at all     Social History   Tobacco Use  Smoking Status Former   Types: Cigarettes   Quit date: 12/30/1989   Years since quitting: 32.6  Smokeless Tobacco Never   BP Readings from Last 3 Encounters:  08/10/22 135/82  05/11/22 122/62  04/26/22 (!) 153/93   Pulse Readings from Last 3 Encounters:  08/10/22 72  05/11/22 70  04/26/22 74   Wt Readings from Last 3 Encounters:  08/10/22 200 lb (90.7 kg)  05/11/22 201 lb 8 oz (91.4 kg)  02/09/22 196 lb 6.4 oz (89.1 kg)   BMI Readings from Last 3 Encounters:  08/10/22 30.42 kg/m  05/11/22 30.65 kg/m  02/09/22 25.49 kg/m    Allergies  Allergen Reactions   Ace Inhibitors     Muscle aches    Crestor [Rosuvastatin Calcium] Other (See Comments)    Muscle aches    Medications Reviewed Today     Reviewed by Marjie Skiff, NP (Nurse Practitioner) on 08/10/22 at 1035  Med List Status: <None>   Medication Order Taking? Sig Documenting Provider Last Dose Status Informant  carvedilol (COREG) 12.5 MG tablet [id number]  Take 1 tablet (12.5 mg total) by mouth 2 (two) times daily. Aura Dials T, NP  Active   Cholecalciferol (VITAMIN D3 SUPER STRENGTH) 50 MCG (2000 UT) CAPS [id number] Yes Take 2,000 Units by mouth daily.  [provider] Taking  Active   dapagliflozin propanediol (FARXIGA) 10 MG TABS tablet [id number] Yes Take 10 mg by mouth daily. Aura Dials T, NP Taking Active   dexlansoprazole (DEXILANT) 60 MG capsule [id number]  Take 1 capsule (60 mg total) by mouth daily. Aura Dials T, NP  Active   febuxostat (ULORIC) 40 MG tablet [id number]  Take 1 tablet (40 mg total) by mouth daily. Aura Dials T, NP  Active   levothyroxine (SYNTHROID) 50 MCG tablet [id number]  Take 1 tablet (50 mcg total) by mouth daily. Aura Dials T, NP  Active   sacubitril-valsartan (ENTRESTO) 24-26 [place] [id number] Yes Take 1 tablet by mouth 2 (two) times daily. Aura Dials T, NP Taking Active   sitaGLIPtin (JANUVIA) 100 MG tablet [id number] Yes Take 1 tablet (100 mg total) by mouth daily. Aura Dials T, NP Taking Active   spironolactone (ALDACTONE) 25 MG tablet [id number]  Take 1 tablet (25 mg total) by mouth daily. Marjie Skiff, NP  Active             SDOH:  (Social Determinants of Health) assessments and interventions performed: Yes SDOH Interventions    Flowsheet Row Care Coordination from 08/30/2022 in [facility] Chronic Care Management from 07/30/2022 in [facility] Chronic Care Management from 04/09/2022 in [facility] Chronic Care Management from 03/08/2022 in [facility] Clinical Support from 02/05/2022 in [facility] Chronic Care Management from 09/21/2021 in [facility]  SDOH Interventions        Food Insecurity Interventions -- -- Intervention Not Indicated -- Intervention Not Indicated Intervention Not Indicated  Housing Interventions -- -- Intervention Not Indicated -- Intervention Not Indicated --  Transportation Interventions Intervention Not Indicated -- Intervention Not Indicated Intervention Not Indicated Intervention Not Indicated Intervention Not Indicated  Utilities Interventions  -- -- Intervention Not Indicated -- Intervention Not Indicated --  Alcohol Usage Interventions -- -- Intervention Not Indicated (Score <7) -- Intervention Not Indicated (Score <7) --  Financial Strain Interventions Other (Comment)  [PAP] -- Other (Comment)  [patient with PAP and working with pharm D for medication assistance] Other (Comment)  [PAP] Intervention Not Indicated --  Physical Activity Interventions -- Intervention Not Indicated, Other (Comments)  [the patient walks daily on his treadmill or outside for at least 30 minutes] Other (Comments)  [encouraged activity, states that he walks and is active but no structured activity] -- Intervention Not Indicated --  Stress Interventions -- -- Intervention Not Indicated -- Intervention Not Indicated --  Social Connections Interventions -- -- Intervention Not Indicated -- Intervention Not Indicated --       Medication Assistance: (See note)  Name and location of Current pharmacy:  [facility] - [place], [place] - [address] [place] [address] Phone: [phone] Fax: [phone]  Compliance/Adherence/Medication fill history: Care Gaps: Annual wellness visit in last year? Yes   If Diabetic: Last eye exam / retinopathy screening:09/2021 Last diabetic foot exam:07/10/21 Last UACR: 05/11/2022   Star Rating Drugs:  Farxiga 10 mg Last filled:12/23/21 30 DS, 11/26/21 30 Sacubitril-valsartan 24-26 mg Last filled:05/12/22 30 DS, 04/15/22 30 DS Januvia 100 mg Last filled:06/21/22 30 DS, 05/24/22 30 DS   Assessment/Plan   Hyperlipidemia: (LDL goal < 70) The 10-year ASCVD risk score (Arnett DK, et al., 2019) is: 43.9%   Values used to calculate the score:     Age:  71 years     Sex: Male     Is Non-Hispanic African American: No     Diabetic: Yes     Tobacco smoker: No     Systolic Blood Pressure: 135 mmHg     Is BP treated: Yes     HDL Cholesterol: 30 mg/dL     Total Cholesterol: 147 mg/dL Lab Results   Component Value Date   CHOL 147 08/10/2022   CHOL 153 01/25/2022   CHOL 143 07/10/2021   Lab Results  Component Value Date   HDL 30 (L) 08/10/2022   HDL 32 (L) 01/25/2022   HDL 33 (L) 07/10/2021   Lab Results  Component Value Date   LDLCALC 62 08/10/2022   LDLCALC 73 01/25/2022   LDLCALC 70 07/10/2021   Lab Results  Component Value Date   TRIG 349 (H) 08/10/2022   TRIG 294 (H) 01/25/2022   TRIG 242 (H) 07/10/2021   Lab Results  Component Value Date   CHOLHDL 4.0 05/24/2018   CHOLHDL 4.5 04/14/2016   No results found for: "LDLDIRECT" Last vitamin D Lab Results  Component Value Date   VD25OH 41.8 07/10/2021   Lab Results  Component Value Date   TSH 4.060 08/10/2022  -Uncontrolled -Current treatment: No Rx therapy -Medications previously tried: rosuvastatin (myalgia)  -Current dietary patterns: see dm -Current exercise habits: see dm -Educated on Cholesterol goals;  Benefits of statin for ASCVD risk reduction; April 2024: Unable to tolerate statins. Will defer to PCP   Diabetes (A1c goal <8%) -Controlled Lab Results  Component Value Date   HGBA1C 7.5 (H) 08/10/2022   HGBA1C 7.5 (H) 05/11/2022   HGBA1C 7.0 (H) 01/25/2022   Lab Results  Component Value Date   MICROALBUR 10 08/10/2022   LDLCALC 62 08/10/2022   CREATININE 1.45 (H) 08/10/2022    Lab Results  Component Value Date   NA 137 08/10/2022   K 4.5 08/10/2022   CREATININE 1.45 (H) 08/10/2022   EGFR 52 (L) 08/10/2022   GFRNONAA 51 (L) 06/04/2020   GLUCOSE 167 (H) 08/10/2022    Lab Results  Component Value Date   WBC 5.5 08/10/2022   HGB 15.6 08/10/2022   HCT 46.1 08/10/2022   MCV 92 08/10/2022   PLT 169 08/10/2022    Lab Results  Component Value Date   LABMICR See below: 05/24/2018   LABMICR See below: 04/14/2016   MICROALBUR 10 08/10/2022   MICROALBUR 10 05/11/2022  -Current medications: Farxiga 10 mg Appropriate, Effective, Safe, Query accessible 2023: PAP Approved 2024:  Start April Januvia 50 mg daily  2023: Approved 2024: Approved -Medications previously tried: rybelsus - stomach issues/GERD  -Current home glucose readings fasting glucose:  October 2023: Didn't have readings April 2024: Didn't have readings post prandial glucose:  -Denies hypoglycemic/hyperglycemic symptoms -Current meal patterns:  breakfast: 2 cups of coffee, two eggs (+/- bread), strip of bacon or sausage.   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 Not sedentary -Educated on A1c and blood sugar goals; Carbohydrate counting and/or plate method -Counseled to check feet daily and get yearly eye exams October 2023: Sitagliptin PAP. Will renew PAP's for 2024 April 2024: Will have [place] send out PAP for patient ASAP. 2024 PAP hasn't been sent in for [facility] for some reason   Heart Failure (Goal: manage symptoms and prevent  exacerbations) -Controlled -Last ejection fraction:  3/14      Echo      40-45 %    2/18      Echo      35-40 %    3/19      Echo      40-45%       -HF type: Systolic -NYHA Class: II (slight limitation of activity) -AHA HF Stage: C (Heart disease and symptoms present) -Current treatment: Carvedilol 12.5mg  BID Appropriate, Effective, Safe, Accessible Dapagliflozin 10mg  Appropriate, Effective, Safe, Query accessible 2023: PAP Approved 2024: Start April Entresto 24/26 Appropriate, Effective, Safe, Accessible 2023: PAP Approved 2024: PAP Approved Spironolactone 25mg  Appropriate, Effective, Safe, Accessible -Medications previously tried: N/A  -Current home BP/HR readings: didn't have list -Current dietary habits: "Tries to eat healthy" -Current exercise habits: N/A -Educated on Benefits of medications for managing symptoms and prolonging life -Recommended to continue  current medication   Gout (Goal: Prevent gout flares) Lab Results  Component Value Date   LABURIC 3.2 (L) 08/10/2022  -Not ideally controlled -Last Gout Flare: Years ago, can't remember -Current treatment  Feboxostat Query Appropriate,  -Medications previously tried: Allopurinol (Was changed because new drug came out) -We discussed:  Counseled patient on low purine diet plan. Counseled patient to reduce consumption of high-fructose corn syrup, sweetened soft drinks, fruit juices, meat, and seafood. October 2023: Recommend changing back to Allopurinol. Patient agrees with this April 2024: Will ask PCP again about Febuxostat  CP F/U November 2024  Artelia Laroche, [place].D. - [phone]

## 2022-09-02 NOTE — Progress Notes (Signed)
Remote ICD transmission.   

## 2022-09-06 ENCOUNTER — Telehealth: Payer: Self-pay | Admitting: Nurse Practitioner

## 2022-09-06 NOTE — Telephone Encounter (Signed)
Pt's wife came in and dropped paperwork off to be filled out for assistance for Farixga.  Placed in provider's folder.

## 2022-09-07 NOTE — Telephone Encounter (Signed)
Signed and placed in Pharmacy folder 

## 2022-09-21 ENCOUNTER — Telehealth: Payer: Self-pay

## 2022-09-21 ENCOUNTER — Other Ambulatory Visit: Payer: Self-pay | Admitting: Nurse Practitioner

## 2022-09-21 MED ORDER — DAPAGLIFLOZIN PROPANEDIOL 10 MG PO TABS: ORAL_TABLET | ORAL | 4 refills | Status: DC

## 2022-09-21 NOTE — Progress Notes (Signed)
Care Management & Coordination Services Pharmacy Team  Reason for Encounter: Patient assistance renewal   [place] for the medication Farxiga. Representative stated that patient will need his 2023 tax return not the 2022. Spoke with patient wife on one occasion and Ryan [name] today both stated that they will not get 2023 tax return until about October because of investments that they have. Patient stated that he would like for [name] to sent in a prescription to [place] for Marcelline Deist until he is able to get his tax return.    Velvet Bathe

## 2022-09-23 ENCOUNTER — Telehealth: Payer: Self-pay

## 2022-09-23 NOTE — Progress Notes (Cosign Needed)
Care Management & Coordination Services Pharmacy Team  Reason for Encounter: Diabetes  Contacted patient to discuss diabetes disease state. Spoke with patient on 09/30/2022    Recent office visits:  None noted  Recent consult visits:  None noted  Hospital visits:  None in previous 6 months  Medications: Outpatient Encounter Medications as of 09/23/2022  Medication Sig   carvedilol (COREG) 12.5 MG tablet Take 1 tablet (12.5 mg total) by mouth 2 (two) times daily.   Cholecalciferol (VITAMIN D3 SUPER STRENGTH) 50 MCG (2000 UT) CAPS Take 2,000 Units by mouth daily.   dapagliflozin propanediol (FARXIGA) 10 MG TABS tablet Take 10 mg by mouth daily.   dexlansoprazole (DEXILANT) 60 MG capsule Take 1 capsule (60 mg total) by mouth daily.   febuxostat (ULORIC) 40 MG tablet Take 1 tablet (40 mg total) by mouth daily.   levothyroxine (SYNTHROID) 50 MCG tablet Take 1 tablet (50 mcg total) by mouth daily.   sacubitril-valsartan (ENTRESTO) 24-26 MG Take 1 tablet by mouth 2 (two) times daily.   sitaGLIPtin (JANUVIA) 100 MG tablet Take 1 tablet (100 mg total) by mouth daily.   spironolactone (ALDACTONE) 25 MG tablet Take 1 tablet (25 mg total) by mouth daily.   No facility-administered encounter medications on file as of 09/23/2022.    Recent Relevant Labs: Lab Results  Component Value Date/Time   HGBA1C 7.5 (H) 08/10/2022 10:02 AM   HGBA1C 7.5 (H) 05/11/2022 09:16 AM   HGBA1C 6.9 11/08/2017 12:00 AM   HGBA1C 6.9 11/08/2017 12:00 AM   MICROALBUR 10 08/10/2022 10:02 AM   MICROALBUR 10 05/11/2022 09:16 AM    Kidney Function Lab Results  Component Value Date/Time   CREATININE 1.45 (H) 08/10/2022 10:04 AM   CREATININE 1.24 01/25/2022 10:23 AM   GFRNONAA 51 (L) 06/04/2020 09:37 AM   GFRAA 59 (L) 06/04/2020 09:37 AM   Current antihyperglycemic regimen:  Farxiga 10 mg take 1 tablet daily Januvia 100 mg take 1 tablet daily   Patient verbally confirms he is taking the above medications as  directed. Yes  What diet changes have been made to improve diabetes control? Patient states he does try to stay sway from sweets, but has a hard time cutting back eating potatoes and bread.  What recent interventions/DTPs have been made to improve glycemic control:  None noted  Have there been any recent hospitalizations or ED visits since last visit with PharmD? No  Patient denies hypoglycemic symptoms, including Pale, Sweaty, Shaky, Hungry, Nervous/irritable, and Vision changes  Patient denies hyperglycemic symptoms, including blurry vision, excessive thirst, fatigue, polyuria, and weakness  How often are you checking your blood sugar? once daily  What are your blood sugars ranging?  Fasting: Patient states he has ranges of 145-175 Before meals:  After meals:  Bedtime:   During the week, how often does your blood glucose drop below 70? Never  Are you checking your feet daily/regularly? Yes  Patient stated he is grateful for what we do and that his health is in good hands.   Star Rating Drugs:  Farxiga 10 mg Last filled:12/24/22 30 DS, 09/21/22 30 DS Entresto 24-26 mg Last filled:04/15/22 30 DS, 05/12/22 30 DS Januvia 100 mg Last filled:06/21/22 30 DS, 07/19/22 30 DS  Care Gaps: Annual wellness visit in last year? Yes Last eye exam / retinopathy screening: Last diabetic foot exam:  Adherence Review: Is the patient currently on a STATIN medication? Yes Is the patient currently on ACE/ARB medication? No Does the patient have >5 day gap  between last estimated fill dates? No   Ryan [name], RMA

## 2022-09-27 NOTE — Progress Notes (Signed)
Care Management & Coordination Services Pharmacy Team  Reason for Encounter: Patient assistance renewal   [place] for the medication Farxiga,start 09/06/22 until date: 05/10/23. Shipment went out for patient and will be delivered on 09/30/22.    Velvet Bathe

## 2022-10-08 ENCOUNTER — Ambulatory Visit (INDEPENDENT_AMBULATORY_CARE_PROVIDER_SITE_OTHER): Payer: Medicare Other

## 2022-10-08 ENCOUNTER — Telehealth: Payer: Medicare Other

## 2022-10-08 DIAGNOSIS — I5020 Unspecified systolic (congestive) heart failure: Secondary | ICD-10-CM

## 2022-10-08 DIAGNOSIS — I10 Essential (primary) hypertension: Secondary | ICD-10-CM | POA: Diagnosis not present

## 2022-10-08 DIAGNOSIS — E1159 Type 2 diabetes mellitus with other circulatory complications: Secondary | ICD-10-CM

## 2022-10-08 DIAGNOSIS — I5022 Chronic systolic (congestive) heart failure: Secondary | ICD-10-CM

## 2022-10-08 NOTE — Chronic Care Management (AMB) (Signed)
Chronic Care Management   CCM RN Visit Note  10/08/2022 Name: [name] [name]: [id number] DOB: May 15, 1950  Subjective: Ryan [name] is a 72 y.o. year old male who is a primary care patient of Cannady, [name], NP. The patient was referred to the Chronic Care Management team for assistance with care management needs subsequent to provider initiation of CCM services and plan of care.    Today's Visit:  Engaged with patient by telephone for follow up visit.        Goals Addressed             This Visit's Progress    CCM Expected Outcome:  Monitor, Self-Manage and Reduce Symptoms of Diabetes       Current Barriers:  Chronic Disease Management support and education needs related to effective management of DM Financial Constraints.  Lab Results  Component Value Date   HGBA1C 7.5 (H) 08/10/2022     Planned Interventions: Provided education to patient about basic DM disease process. The patient still above goal but states he feels like he is doing a great job at taking care of his DM. Review of goal of A1c and the patient sees the pcp regularly.  Reviewed medications with patient and discussed importance of medication adherence. The patient is taking [place] and Januvia. The patient was approved for assistance for Januvia and received a shipment of Januvia last Friday on the 15th of March. The patient wishes he could take other medications as they controlled his DM much better but he could not tolerate the side effects. Education and support given. Works with the pharm D on a regular basis. ;        Reviewed prescribed diet with patient heart healthy/ADA diet. The patient states he is eating better since the holidays are over. He is also exercising. The patient states he is trying to be mindful of his dietary habits and eat better. Counseled on importance of regular laboratory monitoring as prescribed. Has labs on a regular basis. Most recent in April        Discussed plans with patient  for ongoing care management follow up and provided patient with direct contact information for care management team;      Provided patient with written educational materials related to hypo and hyperglycemia and importance of correct treatment. Denies any highs or lows.;       Reviewed scheduled/upcoming provider appointments including: 11-09-2022 at 840 am;         Advised patient, providing education and rationale, to check cbg once daily and when you have symptoms of low or high blood sugar and record. States his blood sugars this am was 151.  Education on fasting of <130 and 2 hours after eating of <180     Referral made to pharmacy team for assistance with update on Januvia. The patient has been approved for Januvia and will get it for the year 2024. Works with the pharm D on a regular basis.  Review of patient status, including review of consultants reports, relevant laboratory and other test results, and medications completed;       Advised patient to discuss changes in DM, questions, or concerns with provider;      Screening for signs and symptoms of depression related to chronic disease state;        Assessed social determinant of health barriers;         Symptom Management: Take medications as prescribed   Attend all scheduled provider appointments  Call provider office for new concerns or questions  call the Suicide and Crisis Lifeline: 988 call the [place] National Suicide Prevention Lifeline: 609-729-4764 or TTY: 404-319-1493 TTY (343)610-0377) to talk to a trained counselor call 1-800-273-TALK (toll free, 24 hour hotline) if experiencing a Mental Health or Behavioral Health Crisis  check feet daily for cuts, sores or redness trim toenails straight across manage portion size wash and dry feet carefully every day wear comfortable, cotton socks wear comfortable, well-fitting shoes  Follow Up Plan: Telephone follow up appointment with care management team member scheduled for: 12-03-2022  at 9 am       CCM Expected Outcome:  Monitor, Self-Manage and Reduce Symptoms of Heart Failure       Current Barriers:  Knowledge Deficits related to the importance of daily weights in the effective management of HF Chronic Disease Management support and education needs related to effective management of HF Financial Constraints.  Difficulty obtaining medications  Wt Readings from Last 3 Encounters:  08/10/22 200 lb (90.7 kg)  05/11/22 201 lb 8 oz (91.4 kg)  02/09/22 196 lb 6.4 oz (89.1 kg)     Planned Interventions: Basic overview and discussion of pathophysiology of Heart Failure reviewed. States his heart failure is stable. Denies any swelling in his feet of legs.  Denies any acute fluctuations in weight. Knows to call the provider for 2/3+ in one day and +5 in one week. The patient has a pacemaker and it has been checked recently.  Provided education on low sodium diet. Review and education provided. The patient is eating healthier. Discussed staying hydrated but monitoring for fluid overload. Education and support given.  Reviewed Heart Failure Action Plan in depth and provided written copy Assessed need for readable accurate scales in home Provided education about placing scale on hard, flat surface Advised patient to weigh each morning after emptying bladder Discussed importance of daily weight and advised patient to weigh and record daily Reviewed role of diuretics in prevention of fluid overload and management of heart failure Discussed the importance of keeping all appointments with provider.  Sees cardiologist on a regular basis. Denies any current issues. Feels his chronic conditions are stable. The patient has appointment again with pcp on 11-09-2022 at 840 am Provided patient with education about the role of exercise in the management of heart failure. The patient is walking every day for at least 30 minutes and is doing this every day.  He says he does a brisk 3 miles at times.  On days that it is cold he walks of the treadmill that is in his home. Praised for good habits and keeping an exercise routine Advised patient to discuss changes in heart failure or heart health with provider Screening for signs and symptoms of depression related to chronic disease state  Assessed social determinant of health barriers Pharm D works with the patient on a regular basis. He has been approved for [facility] for 2024. He has his supply. Next shipment goes out in April of his Entresto. States compliance  Symptom Management: Take medications as prescribed   Attend all scheduled provider appointments Call pharmacy for medication refills 3-7 days in advance of running out of medications Call provider office for new concerns or questions  call the Suicide and Crisis Lifeline: 988 call the [place] National Suicide Prevention Lifeline: (269) 491-5504 or TTY: 939-465-3494 TTY (715)476-1278) to talk to a trained counselor call 1-800-273-TALK (toll free, 24 hour hotline) if experiencing a Mental Health or Behavioral Health Crisis  call office if I gain more than 2 pounds in one day or 5 pounds in one week use salt in moderation watch for swelling in feet, ankles and legs every day weigh myself daily develop a rescue plan follow rescue plan if symptoms flare-up track symptoms and what helps feel better or worse dress right for the weather, hot or cold  Follow Up Plan: Telephone follow up appointment with care management team member scheduled for: 11-09-2022 at 0840 am       CCM Expected Outcome:  Monitor, Self-Manage, and Reduce Symptoms of Hypertension       Current Barriers:  Knowledge Deficits related to the importance of monitoring blood pressures on a regular basis to be proactive in monitoring for changes and the increased risk of heart attack and stroke with blood pressures out of range. Chronic Disease Management support and education needs related to effective management of HTN  BP  Readings from Last 3 Encounters:  08/10/22 135/82  05/11/22 122/62  04/26/22 (!) 153/93  Self reported this am was 114/79 Planned Interventions: Evaluation of current treatment plan related to hypertension self management and patient's adherence to plan as established by provider. The patient states that his blood pressures have been good at home. The patient denies any acute changes in HTN or heart healthy. Sees pcp and specialist on a regular basis;   Provided education to patient re: stroke prevention, s/s of heart attack and stroke. The patient is a little above goal on his cholesterol levels. Education today and read the recommendations of the pcp to start taking Omega 3 fish oil or equivalent ; Reviewed prescribed diet heart healthy/ADA diet. Review of heart healthy/ADA diet. The patient states he is eating better and exercising daily by walking at least 30 mins a day.  He is doing well and feels great. Reviewed medications with patient and discussed importance of compliance. The patient is compliant with medications.The patient got approval for 2024 for his Entresto. He is getting that without difficulty and is thankful for the assistance. Works with the pharm D on a regular basis. Read the recommendations of the pcp to start taking Omega 3 fish oil or equivalent. The patient verbalized understanding. He will pick up some and start using ;  Discussed plans with patient for ongoing care management follow up and provided patient with direct contact information for care management team; Advised patient, providing education and rationale, to monitor blood pressure daily and record, calling PCP for findings outside established parameters;  Reviewed scheduled/upcoming provider appointments including: 11-09-2022 at 840 am Advised patient to discuss changes in blood pressures or questions/concerns about heart health with provider; Provided education on prescribed diet heart healthy/ADA diet. Review of  sodium restrictions of <2000 mg daily ;  Discussed complications of poorly controlled blood pressure such as heart disease, stroke, circulatory complications, vision complications, kidney impairment, sexual dysfunction;  Screening for signs and symptoms of depression related to chronic disease state;  Assessed social determinant of health barriers;   Symptom Management: Take medications as prescribed   Attend all scheduled provider appointments Call provider office for new concerns or questions  call the Suicide and Crisis Lifeline: 988 call the [place] National Suicide Prevention Lifeline: (360)040-4794 or TTY: 9096275676 TTY 607-305-5396) to talk to a trained counselor call 1-800-273-TALK (toll free, 24 hour hotline) if experiencing a Mental Health or Behavioral Health Crisis  check blood pressure weekly learn about high blood pressure keep a blood pressure log take blood pressure log to all  doctor appointments call doctor for signs and symptoms of high blood pressure develop an action plan for high blood pressure keep all doctor appointments take medications for blood pressure exactly as prescribed report new symptoms to your doctor  Follow Up Plan: Telephone follow up appointment with care management team member scheduled for: 12-03-2022 at 0900 am          Plan:Telephone follow up appointment with care management team member scheduled for:  12-03-2022 at 0900 am  Alto Denver RN, MSN, CCM RN Care Manager  Chronic Care Management Direct Number: [phone]

## 2022-10-08 NOTE — Patient Instructions (Signed)
Please call the care guide team at [phone] if you need to cancel or reschedule your appointment.   If you are experiencing a Mental Health or Behavioral Health Crisis or need someone to talk to, please call the Suicide and Crisis Lifeline: 988 call the [place] National Suicide Prevention Lifeline: 334-611-9930 or TTY: (605) 258-9919 TTY 502-619-5247) to talk to a trained counselor call 1-800-273-TALK (toll free, 24 hour hotline)   Following is a copy of the CCM Program Consent:  CCM service includes personalized support from designated clinical staff supervised by the physician, including individualized plan of care and coordination with other care providers 24/7 contact phone numbers for assistance for urgent and routine care needs. Service will only be billed when office clinical staff spend 20 minutes or more in a month to coordinate care. Only one practitioner may furnish and bill the service in a calendar month. The patient may stop CCM services at amy time (effective at the end of the month) by phone call to the office staff. The patient will be responsible for cost sharing (co-pay) or up to 20% of the service fee (after annual deductible is met)  Following is a copy of your full provider care plan:   Goals Addressed             This Visit's Progress    CCM Expected Outcome:  Monitor, Self-Manage and Reduce Symptoms of Diabetes       Current Barriers:  Chronic Disease Management support and education needs related to effective management of DM Financial Constraints.  Lab Results  Component Value Date   HGBA1C 7.5 (H) 08/10/2022     Planned Interventions: Provided education to patient about basic DM disease process. The patient still above goal but states he feels like he is doing a great job at taking care of his DM. Review of goal of A1c and the patient sees the pcp regularly.  Reviewed medications with patient and discussed importance of medication adherence. The patient  is taking [place] and Januvia. The patient was approved for assistance for Januvia and received a shipment of Januvia last Friday on the 15th of March. The patient wishes he could take other medications as they controlled his DM much better but he could not tolerate the side effects. Education and support given. Works with the pharm D on a regular basis. ;        Reviewed prescribed diet with patient heart healthy/ADA diet. The patient states he is eating better since the holidays are over. He is also exercising. The patient states he is trying to be mindful of his dietary habits and eat better. Counseled on importance of regular laboratory monitoring as prescribed. Has labs on a regular basis. Most recent in April        Discussed plans with patient for ongoing care management follow up and provided patient with direct contact information for care management team;      Provided patient with written educational materials related to hypo and hyperglycemia and importance of correct treatment. Denies any highs or lows.;       Reviewed scheduled/upcoming provider appointments including: 11-09-2022 at 840 am;         Advised patient, providing education and rationale, to check cbg once daily and when you have symptoms of low or high blood sugar and record. States his blood sugars this am was 151.  Education on fasting of <130 and 2 hours after eating of <180     Referral made to pharmacy team  for assistance with update on Januvia. The patient has been approved for Januvia and will get it for the year 2024. Works with the pharm D on a regular basis.  Review of patient status, including review of consultants reports, relevant laboratory and other test results, and medications completed;       Advised patient to discuss changes in DM, questions, or concerns with provider;      Screening for signs and symptoms of depression related to chronic disease state;        Assessed social determinant of health barriers;          Symptom Management: Take medications as prescribed   Attend all scheduled provider appointments Call provider office for new concerns or questions  call the Suicide and Crisis Lifeline: 988 call the [place] National Suicide Prevention Lifeline: (769) 823-6968 or TTY: (938) 424-0058 TTY 980 785 0191) to talk to a trained counselor call 1-800-273-TALK (toll free, 24 hour hotline) if experiencing a Mental Health or Behavioral Health Crisis  check feet daily for cuts, sores or redness trim toenails straight across manage portion size wash and dry feet carefully every day wear comfortable, cotton socks wear comfortable, well-fitting shoes  Follow Up Plan: Telephone follow up appointment with care management team member scheduled for: 12-03-2022 at 9 am       CCM Expected Outcome:  Monitor, Self-Manage and Reduce Symptoms of Heart Failure       Current Barriers:  Knowledge Deficits related to the importance of daily weights in the effective management of HF Chronic Disease Management support and education needs related to effective management of HF Financial Constraints.  Difficulty obtaining medications  Wt Readings from Last 3 Encounters:  08/10/22 200 lb (90.7 kg)  05/11/22 201 lb 8 oz (91.4 kg)  02/09/22 196 lb 6.4 oz (89.1 kg)     Planned Interventions: Basic overview and discussion of pathophysiology of Heart Failure reviewed. States his heart failure is stable. Denies any swelling in his feet of legs.  Denies any acute fluctuations in weight. Knows to call the provider for 2/3+ in one day and +5 in one week. The patient has a pacemaker and it has been checked recently.  Provided education on low sodium diet. Review and education provided. The patient is eating healthier. Discussed staying hydrated but monitoring for fluid overload. Education and support given.  Reviewed Heart Failure Action Plan in depth and provided written copy Assessed need for readable accurate scales in  home Provided education about placing scale on hard, flat surface Advised patient to weigh each morning after emptying bladder Discussed importance of daily weight and advised patient to weigh and record daily Reviewed role of diuretics in prevention of fluid overload and management of heart failure Discussed the importance of keeping all appointments with provider.  Sees cardiologist on a regular basis. Denies any current issues. Feels his chronic conditions are stable. The patient has appointment again with pcp on 11-09-2022 at 840 am Provided patient with education about the role of exercise in the management of heart failure. The patient is walking every day for at least 30 minutes and is doing this every day.  He says he does a brisk 3 miles at times. On days that it is cold he walks of the treadmill that is in his home. Praised for good habits and keeping an exercise routine Advised patient to discuss changes in heart failure or heart health with provider Screening for signs and symptoms of depression related to chronic disease state  Assessed  social determinant of health barriers Pharm D works with the patient on a regular basis. He has been approved for [facility] for 2024. He has his supply. Next shipment goes out in April of his Entresto. States compliance  Symptom Management: Take medications as prescribed   Attend all scheduled provider appointments Call pharmacy for medication refills 3-7 days in advance of running out of medications Call provider office for new concerns or questions  call the Suicide and Crisis Lifeline: 988 call the [place] National Suicide Prevention Lifeline: (919)253-1516 or TTY: (567)555-8009 TTY 619-118-1723) to talk to a trained counselor call 1-800-273-TALK (toll free, 24 hour hotline) if experiencing a Mental Health or Behavioral Health Crisis  call office if I gain more than 2 pounds in one day or 5 pounds in one week use salt in moderation watch for swelling  in feet, ankles and legs every day weigh myself daily develop a rescue plan follow rescue plan if symptoms flare-up track symptoms and what helps feel better or worse dress right for the weather, hot or cold  Follow Up Plan: Telephone follow up appointment with care management team member scheduled for: 11-09-2022 at 0840 am       CCM Expected Outcome:  Monitor, Self-Manage, and Reduce Symptoms of Hypertension       Current Barriers:  Knowledge Deficits related to the importance of monitoring blood pressures on a regular basis to be proactive in monitoring for changes and the increased risk of heart attack and stroke with blood pressures out of range. Chronic Disease Management support and education needs related to effective management of HTN  BP Readings from Last 3 Encounters:  08/10/22 135/82  05/11/22 122/62  04/26/22 (!) 153/93  Self reported this am was 114/79 Planned Interventions: Evaluation of current treatment plan related to hypertension self management and patient's adherence to plan as established by provider. The patient states that his blood pressures have been good at home. The patient denies any acute changes in HTN or heart healthy. Sees pcp and specialist on a regular basis;   Provided education to patient re: stroke prevention, s/s of heart attack and stroke. The patient is a little above goal on his cholesterol levels. Education today and read the recommendations of the pcp to start taking Omega 3 fish oil or equivalent ; Reviewed prescribed diet heart healthy/ADA diet. Review of heart healthy/ADA diet. The patient states he is eating better and exercising daily by walking at least 30 mins a day.  He is doing well and feels great. Reviewed medications with patient and discussed importance of compliance. The patient is compliant with medications.The patient got approval for 2024 for his Entresto. He is getting that without difficulty and is thankful for the assistance.  Works with the pharm D on a regular basis. Read the recommendations of the pcp to start taking Omega 3 fish oil or equivalent. The patient verbalized understanding. He will pick up some and start using ;  Discussed plans with patient for ongoing care management follow up and provided patient with direct contact information for care management team; Advised patient, providing education and rationale, to monitor blood pressure daily and record, calling PCP for findings outside established parameters;  Reviewed scheduled/upcoming provider appointments including: 11-09-2022 at 840 am Advised patient to discuss changes in blood pressures or questions/concerns about heart health with provider; Provided education on prescribed diet heart healthy/ADA diet. Review of sodium restrictions of <2000 mg daily ;  Discussed complications of poorly controlled blood pressure such as  heart disease, stroke, circulatory complications, vision complications, kidney impairment, sexual dysfunction;  Screening for signs and symptoms of depression related to chronic disease state;  Assessed social determinant of health barriers;   Symptom Management: Take medications as prescribed   Attend all scheduled provider appointments Call provider office for new concerns or questions  call the Suicide and Crisis Lifeline: 988 call the [place] National Suicide Prevention Lifeline: 229-399-6530 or TTY: (320)432-6663 TTY 731-374-2865) to talk to a trained counselor call 1-800-273-TALK (toll free, 24 hour hotline) if experiencing a Mental Health or Behavioral Health Crisis  check blood pressure weekly learn about high blood pressure keep a blood pressure log take blood pressure log to all doctor appointments call doctor for signs and symptoms of high blood pressure develop an action plan for high blood pressure keep all doctor appointments take medications for blood pressure exactly as prescribed report new symptoms to your  doctor  Follow Up Plan: Telephone follow up appointment with care management team member scheduled for: 12-03-2022 at 0900 am          Patient verbalizes understanding of instructions and care plan provided today and agrees to view in MyChart. Active MyChart status and patient understanding of how to access instructions and care plan via MyChart confirmed with patient.  Telephone follow up appointment with care management team member scheduled for: 12-03-2022 at 0900 am

## 2022-10-27 ENCOUNTER — Ambulatory Visit (INDEPENDENT_AMBULATORY_CARE_PROVIDER_SITE_OTHER): Payer: Medicare Other

## 2022-10-27 DIAGNOSIS — I5022 Chronic systolic (congestive) heart failure: Secondary | ICD-10-CM

## 2022-10-27 DIAGNOSIS — I428 Other cardiomyopathies: Secondary | ICD-10-CM

## 2022-10-27 LAB — CUP PACEART REMOTE DEVICE CHECK
Battery Remaining Longevity: 18 mo
Battery Voltage: 2.91 V
Brady Statistic AP VP Percent: 5.46 %
Brady Statistic AP VS Percent: 0.01 %
Brady Statistic AS VP Percent: 94.53 %
Brady Statistic AS VS Percent: 0 %
Brady Statistic RA Percent Paced: 5.43 %
Brady Statistic RV Percent Paced: 99.98 %
Date Time Interrogation Session: 20240619044225
HighPow Impedance: 45 Ohm
HighPow Impedance: 59 Ohm
Implantable Lead Connection Status: 753985
Implantable Lead Connection Status: 753985
Implantable Lead Connection Status: 753985
Implantable Lead Implant Date: 20060505
Implantable Lead Implant Date: 20120111
Implantable Lead Implant Date: 20180314
Implantable Lead Location: 753858
Implantable Lead Location: 753859
Implantable Lead Location: 753860
Implantable Lead Model: 5076
Implantable Lead Model: 6947
Implantable Pulse Generator Implant Date: 20180314
Lead Channel Impedance Value: 212.8 Ohm
Lead Channel Impedance Value: 216.848
Lead Channel Impedance Value: 218.087
Lead Channel Impedance Value: 222.34 Ohm
Lead Channel Impedance Value: 232.653
Lead Channel Impedance Value: 399 Ohm
Lead Channel Impedance Value: 399 Ohm
Lead Channel Impedance Value: 418 Ohm
Lead Channel Impedance Value: 456 Ohm
Lead Channel Impedance Value: 475 Ohm
Lead Channel Impedance Value: 475 Ohm
Lead Channel Impedance Value: 551 Ohm
Lead Channel Impedance Value: 646 Ohm
Lead Channel Impedance Value: 703 Ohm
Lead Channel Impedance Value: 722 Ohm
Lead Channel Impedance Value: 760 Ohm
Lead Channel Impedance Value: 760 Ohm
Lead Channel Impedance Value: 817 Ohm
Lead Channel Pacing Threshold Amplitude: 0.625 V
Lead Channel Pacing Threshold Amplitude: 0.625 V
Lead Channel Pacing Threshold Amplitude: 0.875 V
Lead Channel Pacing Threshold Pulse Width: 0.4 ms
Lead Channel Pacing Threshold Pulse Width: 0.4 ms
Lead Channel Pacing Threshold Pulse Width: 0.4 ms
Lead Channel Sensing Intrinsic Amplitude: 1.375 mV
Lead Channel Sensing Intrinsic Amplitude: 1.375 mV
Lead Channel Setting Pacing Amplitude: 1.5 V
Lead Channel Setting Pacing Amplitude: 1.5 V
Lead Channel Setting Pacing Amplitude: 2 V
Lead Channel Setting Pacing Pulse Width: 0.4 ms
Lead Channel Setting Pacing Pulse Width: 0.4 ms
Lead Channel Setting Sensing Sensitivity: 0.3 mV
Zone Setting Status: 755011
Zone Setting Status: 755011

## 2022-11-06 NOTE — Patient Instructions (Signed)
Be Involved in Your Health Care:  Taking Medications When medications are taken as directed, they can greatly improve your health. But if they are not taken as instructed, they may not work. In some cases, not taking them correctly can be harmful. To help ensure your treatment remains effective and safe, understand your medications and how to take them.  Your lab results, notes and after visit summary will be available on My Chart. We strongly encourage you to use this feature. If lab results are abnormal the clinic will contact you with the appropriate steps. If the clinic does not contact you assume the results are satisfactory. You can always see your results on My Chart. If you have questions regarding your condition, please contact the clinic during office hours. You can also ask questions on My Chart.  We at [facility] are grateful that you chose us to provide care. We strive to provide excellent and compassionate care and are always looking for feedback. If you get a survey from the clinic please complete this.   Diabetes Mellitus Basics  Diabetes mellitus, or diabetes, is a long-term (chronic) disease. It occurs when the body does not properly use sugar (glucose) that is released from food after you eat. Diabetes mellitus may be caused by one or both of these problems: Your pancreas does not make enough of a hormone called insulin. Your body does not react in a normal way to the insulin that it makes. Insulin lets glucose enter cells in your body. This gives you energy. If you have diabetes, glucose cannot get into cells. This causes high blood glucose (hyperglycemia). How to treat and manage diabetes You may need to take insulin or other diabetes medicines daily to keep your glucose in balance. If you are prescribed insulin, you will learn how to give yourself insulin by injection. You may need to adjust the amount of insulin you take based on the foods that you eat. You will  need to check your blood glucose levels using a glucose monitor as told by your health care provider. The readings can help determine if you have low or high blood glucose. Generally, you should have these blood glucose levels: Before meals (preprandial): 80-130 mg/dL (4.4-7.2 mmol/L). After meals (postprandial): below 180 mg/dL (10 mmol/L). Hemoglobin A1c (HbA1c) level: less than 7%. Your health care provider will set treatment goals for you. Keep all follow-up visits. This is important. Follow these instructions at home: Diabetes medicines Take your diabetes medicines every day as told by your health care provider. List your diabetes medicines here: Name of medicine: ______________________________ Amount (dose): _______________ Time (a.m./p.m.): _______________ Notes: ___________________________________ Name of medicine: ______________________________ Amount (dose): _______________ Time (a.m./p.m.): _______________ Notes: ___________________________________ Name of medicine: ______________________________ Amount (dose): _______________ Time (a.m./p.m.): _______________ Notes: ___________________________________ Insulin If you use insulin, list the types of insulin you use here: Insulin type: ______________________________ Amount (dose): _______________ Time (a.m./p.m.): _______________Notes: ___________________________________ Insulin type: ______________________________ Amount (dose): _______________ Time (a.m./p.m.): _______________ Notes: ___________________________________ Insulin type: ______________________________ Amount (dose): _______________ Time (a.m./p.m.): _______________ Notes: ___________________________________ Insulin type: ______________________________ Amount (dose): _______________ Time (a.m./p.m.): _______________ Notes: ___________________________________ Insulin type: ______________________________ Amount (dose): _______________ Time (a.m./p.m.): _______________  Notes: ___________________________________ Managing blood glucose  Check your blood glucose levels using a glucose monitor as told by your health care provider. Write down the times that you check your glucose levels here: Time: _______________ Notes: ___________________________________ Time: _______________ Notes: ___________________________________ Time: _______________ Notes: ___________________________________ Time: _______________ Notes: ___________________________________ Time: _______________ Notes: ___________________________________ Time: _______________ Notes: ___________________________________    Low blood glucose Low blood glucose (hypoglycemia) is when glucose is at or below 70 mg/dL (3.9 mmol/L). Symptoms may include: Feeling: Hungry. Sweaty and clammy. Irritable or easily upset. Dizzy. Sleepy. Having: A fast heartbeat. A headache. A change in your vision. Numbness around the mouth, lips, or tongue. Having trouble with: Moving (coordination). Sleeping. Treating low blood glucose To treat low blood glucose, eat or drink something containing sugar right away. If you can think clearly and swallow safely, follow the 15:15 rule: Take 15 grams of a fast-acting carb (carbohydrate), as told by your health care provider. Some fast-acting carbs are: Glucose tablets: take 3-4 tablets. Hard candy: eat 3-5 pieces. Fruit juice: drink 4 oz (120 mL). Regular (not diet) soda: drink 4-6 oz (120-180 mL). Honey or sugar: eat 1 Tbsp (15 mL). Check your blood glucose levels 15 minutes after you take the carb. If your glucose is still at or below 70 mg/dL (3.9 mmol/L), take 15 grams of a carb again. If your glucose does not go above 70 mg/dL (3.9 mmol/L) after 3 tries, get help right away. After your glucose goes back to normal, eat a meal or a snack within 1 hour. Treating very low blood glucose If your glucose is at or below 54 mg/dL (3 mmol/L), you have very low blood glucose  (severe hypoglycemia). This is an emergency. Do not wait to see if the symptoms will go away. Get medical help right away. Call your local emergency services (911 in the [place] Do not drive yourself to the hospital. Questions to ask your health care provider Should I talk with a diabetes educator? What equipment will I need to care for myself at home? What diabetes medicines do I need? When should I take them? How often do I need to check my blood glucose levels? What number can I call if I have questions? When is my follow-up visit? Where can I find a support group for people with diabetes? Where to find more information American Diabetes Association: www.diabetes.org Association of Diabetes Care and Education Specialists: www.diabeteseducator.org Contact a health care provider if: Your blood glucose is at or above 240 mg/dL (13.3 mmol/L) for 2 days in a row. You have been sick or have had a fever for 2 days or more, and you are not getting better. You have any of these problems for more than 6 hours: You cannot eat or drink. You feel nauseous. You vomit. You have diarrhea. Get help right away if: Your blood glucose is lower than 54 mg/dL (3 mmol/L). You get confused. You have trouble thinking clearly. You have trouble breathing. These symptoms may represent a serious problem that is an emergency. Do not wait to see if the symptoms will go away. Get medical help right away. Call your local emergency services (911 in the [place] Do not drive yourself to the hospital. Summary Diabetes mellitus is a chronic disease that occurs when the body does not properly use sugar (glucose) that is released from food after you eat. Take insulin and diabetes medicines as told. Check your blood glucose every day, as often as told. Keep all follow-up visits. This is important. This information is not intended to replace advice given to you by your health care provider. Make sure you discuss any  questions you have with your health care provider. Document Revised: 08/28/2019 Document Reviewed: 08/28/2019 Elsevier Patient Education  2024 Elsevier Inc.  

## 2022-11-09 ENCOUNTER — Ambulatory Visit (INDEPENDENT_AMBULATORY_CARE_PROVIDER_SITE_OTHER): Payer: Medicare Other | Admitting: Nurse Practitioner

## 2022-11-09 ENCOUNTER — Encounter: Payer: Self-pay | Admitting: Nurse Practitioner

## 2022-11-09 VITALS — BP 120/75 | HR 70 | Temp 97.7°F | Ht 67.99 in | Wt 196.4 lb

## 2022-11-09 DIAGNOSIS — G720 Drug-induced myopathy: Secondary | ICD-10-CM

## 2022-11-09 DIAGNOSIS — E6609 Other obesity due to excess calories: Secondary | ICD-10-CM

## 2022-11-09 DIAGNOSIS — I152 Hypertension secondary to endocrine disorders: Secondary | ICD-10-CM | POA: Diagnosis not present

## 2022-11-09 DIAGNOSIS — E1169 Type 2 diabetes mellitus with other specified complication: Secondary | ICD-10-CM | POA: Diagnosis not present

## 2022-11-09 DIAGNOSIS — N183 Chronic kidney disease, stage 3 unspecified: Secondary | ICD-10-CM

## 2022-11-09 DIAGNOSIS — E1159 Type 2 diabetes mellitus with other circulatory complications: Secondary | ICD-10-CM | POA: Diagnosis not present

## 2022-11-09 DIAGNOSIS — Z95810 Presence of automatic (implantable) cardiac defibrillator: Secondary | ICD-10-CM | POA: Diagnosis not present

## 2022-11-09 DIAGNOSIS — I5022 Chronic systolic (congestive) heart failure: Secondary | ICD-10-CM

## 2022-11-09 DIAGNOSIS — E785 Hyperlipidemia, unspecified: Secondary | ICD-10-CM | POA: Diagnosis not present

## 2022-11-09 DIAGNOSIS — I429 Cardiomyopathy, unspecified: Secondary | ICD-10-CM | POA: Diagnosis not present

## 2022-11-09 DIAGNOSIS — E1122 Type 2 diabetes mellitus with diabetic chronic kidney disease: Secondary | ICD-10-CM

## 2022-11-09 DIAGNOSIS — E039 Hypothyroidism, unspecified: Secondary | ICD-10-CM

## 2022-11-09 LAB — BAYER DCA HB A1C WAIVED: HB A1C (BAYER DCA - WAIVED): 7.3 % — ABNORMAL HIGH (ref 4.8–5.6)

## 2022-11-09 NOTE — Assessment & Plan Note (Signed)
Chronic, stable.  Euvolemic today.  Continue current medication regimen and collaboration with cardiology.  LABS: BMP.   Recommend: - Reminded to call for an overnight weight gain of >2 pounds or a weekly weight weight of >5 pounds - not adding salt to his food and has been reading food labels. Reviewed the importance of keeping daily sodium intake to 2000mg  daily  - Avoid NSAIDS

## 2022-11-09 NOTE — Assessment & Plan Note (Signed)
Chronic, stable.  BP at goal in office today.  Continue current medication regimen and collaboration with cardiology.  Recommend checking BP at home at least a few mornings a week + focus on DASH diet.  LABS: BMP.  Urine ALB 18 August 2022.  Return in 3 months.

## 2022-11-09 NOTE — Progress Notes (Signed)
BP 120/75   Pulse 70   Temp 97.7 F (36.5 C) (Oral)   Ht 5' 7.99" (1.727 m)   Wt 196 lb 6.4 oz (89.1 kg)   SpO2 99%   BMI 29.87 kg/m    Subjective:    Patient ID: Ryan [name], male    DOB: [birth year], 72 y.o.   [name]: [id number]  HPI: Ryan [name] is a 72 y.o. male  Chief Complaint  Patient presents with   Diabetes   Hypertension   Hyperlipidemia   DIABETES A1c in April 7.5%.  Takes Marcelline Deist (gets via assistance) + Januvia 100 MG daily (gets via assistance).  Had intolerance to Metformin and Rybelsus. Prefers no injectables.  Is performing walking routine and doing a lot of mowing.  He reports his water has been condemned due to have high levels of PFOS, drinking bottled water and working with city and state.  Supposed to have PFOS level of 4 per state and [place] has PFOS level of 8. He has well water and level was 156. Hypoglycemic episodes:no Polydipsia/polyuria: no Visual disturbance: no Chest pain: no Paresthesias: no Glucose Monitoring: no             Accucheck frequency: on occasion = 140 to 170 range             Fasting glucose:              Post prandial:             Evening:             Before meals: Taking Insulin?: no             Long acting insulin:             Short acting insulin: Blood Pressure Monitoring: daily Retinal Examination: Up to Date  Foot Exam: Up to Date Pneumovax: refuses Influenza:  refused Aspirin: no    HYPERTENSION / HYPERLIPIDEMIA/HF Follows with cardiology, Dr. Graciela Husbands, last saw 02/09/22 -- they have discussed triglycerides and cholesterol at visits, he recommended continue current diet focus.  Has cardio-defib implantable (replacement last in January 2012) last check 07/28/22.  Has 1 year and 6 months on battery left.  Continues on Aldactone, Entresto (gets via assistance), Coreg.  No statin, cardiology took him off of this years ago.  Pravastatin in past with poor response -- myalgia.   Satisfied with current treatment?  yes Duration of hypertension: chronic BP monitoring frequency: a few times a week BP range: 120/80 range BP medication side effects: no Duration of hyperlipidemia: chronic Medication compliance: good compliance Aspirin: no Recent stressors: no Recurrent headaches: no Visual changes: no Palpitations: no Dyspnea: no Chest pain: no Lower extremity edema: no Dizzy/lightheaded: no  The 10-year ASCVD risk score (Arnett DK, et al., 2019) is: 39.4%   Values used to calculate the score:     Age: 72 years     Sex: Male     Is Non-Hispanic African American: No     Diabetic: Yes     Tobacco smoker: No     Systolic Blood Pressure: 120 mmHg     Is BP treated: Yes     HDL Cholesterol: 30 mg/dL     Total Cholesterol: 147 mg/dL  CHRONIC KIDNEY DISEASE Stable since December 2022.  CKD status: stable Medications renally dose: yes Previous renal evaluation: no Pneumovax:  refuses Influenza Vaccine:  refuses   HYPOTHYROIDISM Continues on Levothyroxine 50 MCG daily.  Recent labs stable. Thyroid control  status:stable Satisfied with current treatment? yes Medication side effects: no Medication compliance: good compliance Etiology of hypothyroidism: unknown Recent dose adjustment:no Fatigue: no Cold intolerance: no Heat intolerance: no Weight gain: no Weight loss: no Constipation: no Diarrhea/loose stools: no Palpitations: no Lower extremity edema: no Anxiety/depressed mood: no   Relevant past medical, surgical, family and social history reviewed and updated as indicated. Interim medical history since our last visit reviewed. Allergies and medications reviewed and updated.  Review of Systems  Constitutional:  Negative for activity change, diaphoresis, fatigue and fever.  Respiratory:  Negative for cough, chest tightness, shortness of breath and wheezing.   Cardiovascular:  Negative for chest pain, palpitations and leg swelling.  Gastrointestinal: Negative.   Endocrine: Negative  for polydipsia, polyphagia and polyuria.  Neurological: Negative.   Psychiatric/Behavioral: Negative.      Per HPI unless specifically indicated above     Objective:    BP 120/75   Pulse 70   Temp 97.7 F (36.5 C) (Oral)   Ht 5' 7.99" (1.727 m)   Wt 196 lb 6.4 oz (89.1 kg)   SpO2 99%   BMI 29.87 kg/m   Wt Readings from Last 3 Encounters:  11/09/22 196 lb 6.4 oz (89.1 kg)  08/10/22 200 lb (90.7 kg)  05/11/22 201 lb 8 oz (91.4 kg)    Physical Exam Vitals and nursing note reviewed.  Constitutional:      General: He is awake. He is not in acute distress.    Appearance: He is well-developed and well-groomed. He is not ill-appearing.  HENT:     Head: Normocephalic and atraumatic.     Right Ear: Hearing normal. No drainage.     Left Ear: Hearing normal. No drainage.  Eyes:     General: Lids are normal.        Right eye: No discharge.        Left eye: No discharge.     Conjunctiva/sclera: Conjunctivae normal.     Pupils: Pupils are equal, round, and reactive to light.  Neck:     Vascular: No carotid bruit.  Cardiovascular:     Rate and Rhythm: Normal rate and regular rhythm.     Heart sounds: Normal heart sounds, S1 normal and S2 normal. No murmur heard.    No gallop.  Pulmonary:     Effort: Pulmonary effort is normal. No accessory muscle usage or respiratory distress.     Breath sounds: Normal breath sounds.  Abdominal:     General: Bowel sounds are normal.     Palpations: Abdomen is soft.  Musculoskeletal:        General: Normal range of motion.     Cervical back: Normal range of motion and neck supple.     Right lower leg: No edema.     Left lower leg: No edema.  Skin:    General: Skin is warm and dry.     Capillary Refill: Capillary refill takes less than 2 seconds.  Neurological:     Mental Status: He is alert and oriented to person, place, and time.  Psychiatric:        Attention and Perception: Attention normal.        Mood and Affect: Mood normal.         Speech: Speech normal.        Behavior: Behavior normal. Behavior is cooperative.        Thought Content: Thought content normal.     Results for orders placed or performed in  visit on 10/27/22  CUP PACEART REMOTE DEVICE CHECK  Result Value Ref Range   Date Time Interrogation Session [id number]    Pulse Generator Manufacturer MERM    Pulse Gen Model [id number] Claria MRI Quad CRT-D    Pulse Gen Serial Number [id number]    Clinic Name [facility]    Implantable Pulse Generator Type Cardiac Resynch Therapy Defibulator    Implantable Pulse Generator Implant Date [id number]    Implantable Lead Manufacturer [facility]    Implantable Lead Model 1458Q Quartet    Implantable Lead Serial Number [id number]    Implantable Lead Implant Date [id number]    Implantable Lead Location Detail 1 UNKNOWN    Implantable Lead Location [id number]    Implantable Lead Connection Status [id number]    Implantable Lead Manufacturer [facility]    Implantable Lead Model 705-104-5179 Sprint Quattro Secure    Implantable Lead Serial Number [id number]    Implantable Lead Implant Date [id number]    Implantable Lead Location [id number]    Implantable Lead Connection Status [id number]    Implantable Lead Manufacturer [employer]M    Implantable Lead Model 5076 CapSureFix Novus    Implantable Lead Serial Number [id number]    Implantable Lead Implant Date [id number]    Implantable Lead Location [id number]    Implantable Lead Connection Status [id number]    Lead Channel Setting Sensing Sensitivity 0.3 mV   Lead Channel Setting Pacing Amplitude 1.5 V   Lead Channel Setting Pacing Pulse Width 0.4 ms   Lead Channel Setting Pacing Amplitude 1.5 V   Lead Channel Setting Pacing Pulse Width 0.4 ms   Lead Channel Setting Pacing Amplitude 2 V   Lead Channel Setting Pacing Capture Mode Monitor Capture    Zone Setting Status Active    Zone Setting Status Inactive    Zone Setting Status Inactive    Zone Setting Status [id number]    Zone Setting Status [phone]    Lead Channel  Impedance Value 399 ohm   Lead Channel Sensing Intrinsic Amplitude 1.375 mV   Lead Channel Sensing Intrinsic Amplitude 1.375 mV   Lead Channel Pacing Threshold Amplitude 0.625 V   Lead Channel Pacing Threshold Pulse Width 0.4 ms   Lead Channel Impedance Value 551 ohm   Lead Channel Impedance Value 475 ohm   Lead Channel Pacing Threshold Amplitude 0.625 V   Lead Channel Pacing Threshold Pulse Width 0.4 ms   HighPow Impedance 45 ohm   HighPow Impedance 59 ohm   Lead Channel Impedance Value 703 ohm   Lead Channel Impedance Value 760 ohm   Lead Channel Impedance Value 817 ohm   Lead Channel Impedance Value 646 ohm   Lead Channel Impedance Value 722 ohm   Lead Channel Impedance Value 760 ohm   Lead Channel Impedance Value 475 ohm   Lead Channel Impedance Value 399 ohm   Lead Channel Impedance Value 418 ohm   Lead Channel Impedance Value 456 ohm   Lead Channel Impedance Value 216.848    Lead Channel Impedance Value 222.34 ohm   Lead Channel Impedance Value 232.653    Lead Channel Impedance Value 212.8 ohm   Lead Channel Impedance Value 218.087    Lead Channel Pacing Threshold Amplitude 0.875 V   Lead Channel Pacing Threshold Pulse Width 0.4 ms   Battery Status OK    Battery Remaining Longevity 18 mo   Battery Voltage 2.91 V   Brady Statistic RA Percent Paced 5.43 %   Brady Statistic RV Percent Paced 99.98 %   [place]  AP VP Percent 5.46 %   Brady Statistic AS VP Percent 94.53 %   Brady Statistic AP VS Percent 0.01 %   Brady Statistic AS VS Percent 0 %      Assessment & Plan:   Problem List Items Addressed This Visit       Cardiovascular and Mediastinum   Hypertension associated with diabetes (HCC)    Chronic, stable.  BP at goal in office today.  Continue current medication regimen and collaboration with cardiology.  Recommend checking BP at home at least a few mornings a week + focus on DASH diet.  LABS: BMP.  Urine ALB 18 August 2022.  Return in 3 months.       Relevant Orders   Bayer DCA Hb A1c Waived   Basic metabolic panel   Idiopathic cardiomyopathy (HCC)    Ongoing, stable, followed by cardiology.  Continue this collaboration.      Systolic CHF (HCC)    Chronic, stable.  Euvolemic today.  Continue current medication regimen and collaboration with cardiology.  LABS: BMP.   Recommend: - Reminded to call for an overnight weight gain of >2 pounds or a weekly weight weight of >5 pounds - not adding salt to his food and has been reading food labels. Reviewed the importance of keeping daily sodium intake to 2000mg  daily  - Avoid NSAIDS      Type 2 diabetes mellitus with cardiac complication (HCC) - Primary    Chronic, ongoing with HF.  A1c 7.3% today, trend down from previous, urine ALB 10 in April 2024.  Rybelsus & Metformin caused GI issues.   - Continue current medication regimen with Farxiga and Januvia 100 MG + continue collaboration with cardiology.   - Could consider Sulfonylurea at low dose if needed in future or a trial of injectable like Trulicity -- although concern for GI issues.  At this time he wishes not to add or change medications. - Recommend he check BS at home at least a few mornings a week with goal fasting <130, bring to next visit.   - Continue to collaborate with CCM crew.   - Up to date on eye and foot exam - Taking ARB, no statin (per cardiology this was stopped in past) - Refuses vaccinations.      Relevant Orders   Bayer DCA Hb A1c Waived     Endocrine   CKD stage 3 due to type 2 diabetes mellitus (HCC)    Ongoing, 3a, however remains stable on labs over past year.  Recheck labs today. Urine ALB 18 August 2022 on check.  Consider referral to nephrology if worsening.      Relevant Orders   Bayer DCA Hb A1c Waived   Basic metabolic panel   Hyperlipidemia associated with type 2 diabetes mellitus (HCC)    Chronic, ongoing.  No current medication due to poor tolerance.   Would benefit from statin use with diabetes  and heart health or consider injectable, discussed with him at visits.  Lipid panel today.  He is agreeable to trial of Rosuvastatin on 3 day or 1 day a week schedule if LDL >70 in future if needed.      Relevant Orders   Bayer DCA Hb A1c Waived   Lipid Panel w/o Chol/HDL Ratio   Hypothyroidism    Chronic, ongoing.  Continue current medication regimen and adjust as needed based on labs.  TSH + Free T4 up to date and normal ranges.  Musculoskeletal and Integument   Drug-induced myopathy    Due to statin therapy, history of on low doses.  Would benefit from statin use with diabetes and heart health or consider injectable, discussed with him today.         Other   Biventricular implantable cardioverter-defibrillator in situ    Continue collaboration with cardiology and regular checks.      Obesity    BMI 29.87.  Recommended eating smaller high protein, low fat meals more frequently and exercising 30 mins a day 5 times a week with a goal of 10-15lb weight loss in the next 3 months. Patient voiced their understanding and motivation to adhere to these recommendations.         Follow up plan: Return in about 3 months (around 02/09/2023) for T2DM, HTN/HLD/HF, HYPOTHYROID.

## 2022-11-09 NOTE — Assessment & Plan Note (Signed)
Chronic, ongoing with HF.  A1c 7.3% today, trend down from previous, urine ALB 10 in April 2024.  Rybelsus & Metformin caused GI issues.   - Continue current medication regimen with Farxiga and Januvia 100 MG + continue collaboration with cardiology.   - Could consider Sulfonylurea at low dose if needed in future or a trial of injectable like Trulicity -- although concern for GI issues.  At this time he wishes not to add or change medications. - Recommend he check BS at home at least a few mornings a week with goal fasting <130, bring to next visit.   - Continue to collaborate with CCM crew.   - Up to date on eye and foot exam - Taking ARB, no statin (per cardiology this was stopped in past) - Refuses vaccinations.

## 2022-11-09 NOTE — Assessment & Plan Note (Signed)
Ongoing, 3a, however remains stable on labs over past year.  Recheck labs today. Urine ALB 18 August 2022 on check.  Consider referral to nephrology if worsening. 

## 2022-11-09 NOTE — Assessment & Plan Note (Signed)
Chronic, ongoing.  No current medication due to poor tolerance.   Would benefit from statin use with diabetes and heart health or consider injectable, discussed with him at visits.  Lipid panel today.  He is agreeable to trial of Rosuvastatin on 3 day or 1 day a week schedule if LDL >70 in future if needed.

## 2022-11-09 NOTE — Assessment & Plan Note (Signed)
Ongoing, stable, followed by cardiology.  Continue this collaboration. 

## 2022-11-09 NOTE — Assessment & Plan Note (Signed)
Continue collaboration with cardiology and regular checks. 

## 2022-11-09 NOTE — Assessment & Plan Note (Signed)
Due to statin therapy, history of on low doses.  Would benefit from statin use with diabetes and heart health or consider injectable, discussed with him today.  

## 2022-11-09 NOTE — Addendum Note (Signed)
Addended by: Aura Dials T on: 11/09/2022 12:05 PM   Modules accepted: Orders

## 2022-11-09 NOTE — Assessment & Plan Note (Signed)
Chronic, ongoing.  Continue current medication regimen and adjust as needed based on labs.  TSH + Free T4 up to date and normal ranges.

## 2022-11-09 NOTE — Assessment & Plan Note (Signed)
BMI 29.87.  Recommended eating smaller high protein, low fat meals more frequently and exercising 30 mins a day 5 times a week with a goal of 10-15lb weight loss in the next 3 months. Patient voiced their understanding and motivation to adhere to these recommendations.  

## 2022-11-10 ENCOUNTER — Telehealth: Payer: Self-pay

## 2022-11-10 LAB — BASIC METABOLIC PANEL
BUN/Creatinine Ratio: 16 (ref 10–24)
BUN: 21 mg/dL (ref 8–27)
CO2: 20 mmol/L (ref 20–29)
Calcium: 9.5 mg/dL (ref 8.6–10.2)
Chloride: 103 mmol/L (ref 96–106)
Creatinine, Ser: 1.31 mg/dL — ABNORMAL HIGH (ref 0.76–1.27)
Glucose: 244 mg/dL — ABNORMAL HIGH (ref 70–99)
Potassium: 4.6 mmol/L (ref 3.5–5.2)
Sodium: 138 mmol/L (ref 134–144)
eGFR: 58 mL/min/{1.73_m2} — ABNORMAL LOW (ref >59)

## 2022-11-10 LAB — LIPID PANEL W/O CHOL/HDL RATIO
Cholesterol, Total: 145 mg/dL (ref 100–199)
HDL: 30 mg/dL — ABNORMAL LOW (ref >39)
LDL Chol Calc (NIH): 67 mg/dL (ref 0–99)
Triglycerides: 303 mg/dL — ABNORMAL HIGH (ref 0–149)
VLDL Cholesterol Cal: 48 mg/dL — ABNORMAL HIGH (ref 5–40)

## 2022-11-10 NOTE — Progress Notes (Signed)
   11/10/2022  Patient ID: Ryan [name], male   DOB: Mar 15, 1951, 72 y.o.   [name]: [id number]  Clinic routed request from patient's PCP, Aura Dials, inquiring about renewal of patient's PAP for [name], and Januvia.  Patient was previously seen by an [facility] pharmacist, and she would like to make sure patient does not fall through the cracks in regard to program renewals.  I am coordinating with the medication assistance team to get. Mr. Kozlowski on their spreadsheet of patient's enrolled in PAP's, so renewals can be addressed timely/appropriately.  Lenna Gilford, PharmD, DPLA

## 2022-11-10 NOTE — Progress Notes (Signed)
Contacted via MyChart   Good afternoon Ryan [name], your labs have returned and kidney function, creatinine and eGFR, remains stable with chronic kidney disease stage 3a.  No worsening.  Ensure good water intake daily.  Cholesterol levels show stable LDL, but those triglycerides remain elevated.  Try taking some Omega 3 daily to help lower these.  We could also consider medication to help lower those levels in future.  We will discuss.   Keep being wonderful!!  Thank you for allowing me to participate in your care.  I appreciate you. Kindest regards, Lakeeta Dobosz

## 2022-11-15 DIAGNOSIS — H43811 Vitreous degeneration, right eye: Secondary | ICD-10-CM | POA: Diagnosis not present

## 2022-11-15 DIAGNOSIS — H2513 Age-related nuclear cataract, bilateral: Secondary | ICD-10-CM | POA: Diagnosis not present

## 2022-11-15 DIAGNOSIS — E119 Type 2 diabetes mellitus without complications: Secondary | ICD-10-CM | POA: Diagnosis not present

## 2022-11-15 LAB — HM DIABETES EYE EXAM

## 2022-11-16 ENCOUNTER — Telehealth: Payer: Self-pay | Admitting: Internal Medicine

## 2022-11-16 ENCOUNTER — Telehealth: Payer: Self-pay

## 2022-11-16 ENCOUNTER — Encounter: Payer: Self-pay | Admitting: Nurse Practitioner

## 2022-11-16 ENCOUNTER — Other Ambulatory Visit: Payer: Self-pay | Admitting: Nurse Practitioner

## 2022-11-16 MED ORDER — ENTRESTO 24-26 MG PO TABS: 1.0000 | ORAL_TABLET | Freq: Two times a day (BID) | ORAL | 1 refills | Status: DC

## 2022-11-16 NOTE — Progress Notes (Signed)
   11/16/2022  Patient ID: Ryan [name], male   DOB: [birth year], 72 y.o.   [name]: [id number]  Received notification today from LCSW that patient was having an issue obtaining a refill on his PAP supplied Entresto.  Program was needing a new prescription to be able to fill and mail out to him.  PCP, Aura Dials sent an order to the pharmacy that processes prescriptions for [employer] PAP.  Contacted Entresto PAP to ensure prescription was received, and they are working on a refill for Ryan [name].  The program will call Ryan [name] when the medication is being shipped; but I instructed him to reach out if he has not heard from them in two days.  We also have a telephone visit scheduled for 12/13/22  Lenna Gilford, PharmD, DPLA

## 2022-11-16 NOTE — Telephone Encounter (Signed)
Spoke to patient and he stated that he needed to have his sacubitril-valsartan (ENTRESTO) 24-26 MG refilled. Explained to the patient that his PCP is the prescriber of that medication and to reach out to them first. Patient understood with read back.

## 2022-11-16 NOTE — Telephone Encounter (Signed)
Pt states he needs to e-scribe a medication. Please advise.

## 2022-11-16 NOTE — Telephone Encounter (Signed)
   CCM RN Visit Note   11-16-2022 Name: Ryan [name] [name]: [id number]      DOB: 1951-01-04  Subjective: Ryan [name] is a 72 y.o. year old male who is a primary care patient of Cannady, [name], NP. The patient was referred to the Chronic Care Management team for assistance with care management needs subsequent to provider initiation of CCM services and plan of care.      Today's Visit: Engaged with patient by telephone for  the patient needed assistance with refill .     Incoming call from the patient asking for help with refill for [facility]. Ask to get the pcp to fax request to [phone] or send by e-scripts to cover my meds at [address] [employer] [place], [place] [address].  After further review the patients Sherryll Burger is prescribed by his cardiologist. Advised the patient that he needed to call his cardiologist office and ask for refill. Did send a secure chat to the pcp letting her know of the patients call and needs. Will continue to monitor.    Plan:The care management team will reach out to the patient again over the next 30 days.  Alto Denver RN, MSN, CCM RN Care Manager  Chronic Care Management Direct Number: [phone]

## 2022-12-03 ENCOUNTER — Ambulatory Visit (INDEPENDENT_AMBULATORY_CARE_PROVIDER_SITE_OTHER): Payer: Medicare Other

## 2022-12-03 ENCOUNTER — Telehealth: Payer: Medicare Other

## 2022-12-03 DIAGNOSIS — I5022 Chronic systolic (congestive) heart failure: Secondary | ICD-10-CM

## 2022-12-03 DIAGNOSIS — E1159 Type 2 diabetes mellitus with other circulatory complications: Secondary | ICD-10-CM

## 2022-12-03 DIAGNOSIS — I152 Hypertension secondary to endocrine disorders: Secondary | ICD-10-CM

## 2022-12-03 NOTE — Chronic Care Management (AMB) (Signed)
Chronic Care Management   CCM RN Visit Note  12/03/2022 Name: Ryan [name] [name]: [id number] DOB: [birth year]  Subjective: Ryan [name] is a 72 y.o. year old male who is a primary care patient of Cannady, [name], NP. The patient was referred to the Chronic Care Management team for assistance with care management needs subsequent to provider initiation of CCM services and plan of care.    Today's Visit:  Engaged with patient by telephone for follow up visit.     SDOH Interventions Today    Flowsheet Row Most Recent Value  SDOH Interventions   Health Literacy Interventions Intervention Not Indicated         Goals Addressed             This Visit's Progress    CCM Expected Outcome:  Monitor, Self-Manage and Reduce Symptoms of Diabetes       Current Barriers:  Chronic Disease Management support and education needs related to effective management of DM Financial Constraints.  Lab Results  Component Value Date   HGBA1C 7.3 (H) 11/09/2022     Planned Interventions: Provided education to patient about basic DM disease process. The patient still above goal but states he feels like he is doing a great job at taking care of his DM. Review of goal of A1c and the patient sees the pcp regularly.  Reviewed medications with patient and discussed importance of medication adherence. The patient is taking [place] and Januvia. The patient was approved for assistance for Januvia. The patient wishes he could take other medications as they controlled his DM much better but he could not tolerate the side effects. Education and support given. Works with the pharm D on a regular basis. Review of pharm D changes and the patient is aware.;        Reviewed prescribed diet with patient heart healthy/ADA diet. The patient states he is eating better since the holidays are over. He is also exercising. The patient states he is trying to be mindful of his dietary habits and eat better. Counseled on importance  of regular laboratory monitoring as prescribed. Has labs on a regular basis. Most recent in April        Discussed plans with patient for ongoing care management follow up and provided patient with direct contact information for care management team;      Provided patient with written educational materials related to hypo and hyperglycemia and importance of correct treatment. Denies any highs or lows.;       Reviewed scheduled/upcoming provider appointments including: 02-09-2023 at 940 am;         Advised patient, providing education and rationale, to check cbg once daily and when you have symptoms of low or high blood sugar and record. Education on fasting of <130 and 2 hours after eating of <180     Referral made to pharmacy team for assistance with update on Januvia. The patient has been approved for Januvia and will get it for the year 2024. Works with the pharm D on a regular basis. Knows about updated pharm D changes. Review of patient status, including review of consultants reports, relevant laboratory and other test results, and medications completed;       Advised patient to discuss changes in DM, questions, or concerns with provider;      Screening for signs and symptoms of depression related to chronic disease state;        Assessed social determinant of health barriers;  Symptom Management: Take medications as prescribed   Attend all scheduled provider appointments Call provider office for new concerns or questions  call the Suicide and Crisis Lifeline: 988 call the [place] National Suicide Prevention Lifeline: (484) 642-3157 or TTY: 360 754 5498 TTY (619)574-6296) to talk to a trained counselor call 1-800-273-TALK (toll free, 24 hour hotline) if experiencing a Mental Health or Behavioral Health Crisis  check feet daily for cuts, sores or redness trim toenails straight across manage portion size wash and dry feet carefully every day wear comfortable, cotton socks wear  comfortable, well-fitting shoes  Follow Up Plan: Telephone follow up appointment with care management team member scheduled for: 02-04-2023 at 9 am       CCM Expected Outcome:  Monitor, Self-Manage and Reduce Symptoms of Heart Failure       Current Barriers:  Knowledge Deficits related to the importance of daily weights in the effective management of HF Chronic Disease Management support and education needs related to effective management of HF Financial Constraints.  Difficulty obtaining medications  Wt Readings from Last 3 Encounters:  11/09/22 196 lb 6.4 oz (89.1 kg)  08/10/22 200 lb (90.7 kg)  05/11/22 201 lb 8 oz (91.4 kg)     Planned Interventions: Basic overview and discussion of pathophysiology of Heart Failure reviewed. States his heart failure is stable. Denies any swelling in his feet of legs.  Denies any acute fluctuations in weight. Knows to call the provider for 2/3+ in one day and +5 in one week. The patient has a pacemaker and he has it checked regularly Provided education on low sodium diet. Review and education provided. The patient is eating healthier. Discussed staying hydrated but monitoring for fluid overload. Education and support given.  Reviewed Heart Failure Action Plan in depth and provided written copy Assessed need for readable accurate scales in home Provided education about placing scale on hard, flat surface Advised patient to weigh each morning after emptying bladder Discussed importance of daily weight and advised patient to weigh and record daily Reviewed role of diuretics in prevention of fluid overload and management of heart failure Discussed the importance of keeping all appointments with provider.  Sees cardiologist on a regular basis. Denies any current issues. Feels his chronic conditions are stable.  Provided patient with education about the role of exercise in the management of heart failure. The patient is walking every day for at least 30  minutes and is doing this every day.  He says he does a brisk 3 miles at times. On days that it is cold he walks of the treadmill that is in his home. Praised for good habits and keeping an exercise routine Advised patient to discuss changes in heart failure or heart health with provider Screening for signs and symptoms of depression related to chronic disease state  Assessed social determinant of health barriers Pharm D works with the patient on a regular basis. He has been approved for [facility] for 2024. He has his supply.Did have to call the company in July to get his next shipment. Review of pharm D help and support. The patient states today that he has all of his medications and he is taking as prescribed.   Symptom Management: Take medications as prescribed   Attend all scheduled provider appointments Call pharmacy for medication refills 3-7 days in advance of running out of medications Call provider office for new concerns or questions  call the Suicide and Crisis Lifeline: 988 call the [place] National Suicide Prevention Lifeline: 951-492-6940 or  TTY: 8-657-846-9 TTY 424-292-7408) to talk to a trained counselor call 1-800-273-TALK (toll free, 24 hour hotline) if experiencing a Mental Health or Behavioral Health Crisis  call office if I gain more than 2 pounds in one day or 5 pounds in one week use salt in moderation watch for swelling in feet, ankles and legs every day weigh myself daily develop a rescue plan follow rescue plan if symptoms flare-up track symptoms and what helps feel better or worse dress right for the weather, hot or cold  Follow Up Plan: Telephone follow up appointment with care management team member scheduled for: 02-04-2023 at 0900 am       CCM Expected Outcome:  Monitor, Self-Manage, and Reduce Symptoms of Hypertension       Current Barriers:  Knowledge Deficits related to the importance of monitoring blood pressures on a regular basis to be proactive in  monitoring for changes and the increased risk of heart attack and stroke with blood pressures out of range. Chronic Disease Management support and education needs related to effective management of HTN  BP Readings from Last 3 Encounters:  11/09/22 120/75  08/10/22 135/82  05/11/22 122/62   Planned Interventions: Evaluation of current treatment plan related to hypertension self management and patient's adherence to plan as established by provider. The patient with more stable blood pressures.The patient states that his blood pressures have been good at home. The patient denies any acute changes in HTN or heart healthy. Sees pcp and specialist on a regular basis;   Provided education to patient re: stroke prevention, s/s of heart attack and stroke. The patient is a little above goal on his cholesterol levels.Is taking Omega 3 fish oil or equivalent ; Reviewed prescribed diet heart healthy/ADA diet. Review of heart healthy/ADA diet. The patient states he is eating better and exercising daily by walking at least 30 mins a day.  He is doing well and feels great. Reviewed medications with patient and discussed importance of compliance. The patient is compliant with medications.The patient got approval for 2024 for his Entresto. The patient states he has his medications and denies any new concerns related to medications at this time. Review of pharm D changes and the patient is aware of pharm D support if needed.  Discussed plans with patient for ongoing care management follow up and provided patient with direct contact information for care management team; Advised patient, providing education and rationale, to monitor blood pressure daily and record, calling PCP for findings outside established parameters;  Reviewed scheduled/upcoming provider appointments including: 02-09-2023 at 940 am Advised patient to discuss changes in blood pressures or questions/concerns about heart health with provider; Provided  education on prescribed diet heart healthy/ADA diet. Review of sodium restrictions of <2000 mg daily ;  Discussed complications of poorly controlled blood pressure such as heart disease, stroke, circulatory complications, vision complications, kidney impairment, sexual dysfunction;  Screening for signs and symptoms of depression related to chronic disease state;  Assessed social determinant of health barriers;   Symptom Management: Take medications as prescribed   Attend all scheduled provider appointments Call provider office for new concerns or questions  call the Suicide and Crisis Lifeline: 988 call the [place] National Suicide Prevention Lifeline: 630-423-0994 or TTY: 905-497-7446 TTY 512-709-8725) to talk to a trained counselor call 1-800-273-TALK (toll free, 24 hour hotline) if experiencing a Mental Health or Behavioral Health Crisis  check blood pressure weekly learn about high blood pressure keep a blood pressure log take blood pressure log to  all doctor appointments call doctor for signs and symptoms of high blood pressure develop an action plan for high blood pressure keep all doctor appointments take medications for blood pressure exactly as prescribed report new symptoms to your doctor  Follow Up Plan: Telephone follow up appointment with care management team member scheduled for: 02-04-2023 at 0900 am          Plan:Telephone follow up appointment with care management team member scheduled for:  02-04-2023 at 0900 am  Alto Denver RN, MSN, CCM RN Care Manager  Chronic Care Management Direct Number: [phone]

## 2022-12-03 NOTE — Patient Instructions (Signed)
Please call the care guide team at [phone] if you need to cancel or reschedule your appointment.   If you are experiencing a Mental Health or Behavioral Health Crisis or need someone to talk to, please call the Suicide and Crisis Lifeline: 988 call the [place] National Suicide Prevention Lifeline: 609-069-7502 or TTY: (609) 087-2602 TTY (571)571-6716) to talk to a trained counselor call 1-800-273-TALK (toll free, 24 hour hotline)   Following is a copy of the CCM Program Consent:  CCM service includes personalized support from designated clinical staff supervised by the physician, including individualized plan of care and coordination with other care providers 24/7 contact phone numbers for assistance for urgent and routine care needs. Service will only be billed when office clinical staff spend 20 minutes or more in a month to coordinate care. Only one practitioner may furnish and bill the service in a calendar month. The patient may stop CCM services at amy time (effective at the end of the month) by phone call to the office staff. The patient will be responsible for cost sharing (co-pay) or up to 20% of the service fee (after annual deductible is met)  Following is a copy of your full provider care plan:   Goals Addressed             This Visit's Progress    CCM Expected Outcome:  Monitor, Self-Manage and Reduce Symptoms of Diabetes       Current Barriers:  Chronic Disease Management support and education needs related to effective management of DM Financial Constraints.  Lab Results  Component Value Date   HGBA1C 7.3 (H) 11/09/2022     Planned Interventions: Provided education to patient about basic DM disease process. The patient still above goal but states he feels like he is doing a great job at taking care of his DM. Review of goal of A1c and the patient sees the pcp regularly.  Reviewed medications with patient and discussed importance of medication adherence. The patient  is taking [place] and Januvia. The patient was approved for assistance for Januvia. The patient wishes he could take other medications as they controlled his DM much better but he could not tolerate the side effects. Education and support given. Works with the pharm D on a regular basis. Review of pharm D changes and the patient is aware.;        Reviewed prescribed diet with patient heart healthy/ADA diet. The patient states he is eating better since the holidays are over. He is also exercising. The patient states he is trying to be mindful of his dietary habits and eat better. Counseled on importance of regular laboratory monitoring as prescribed. Has labs on a regular basis. Most recent in April        Discussed plans with patient for ongoing care management follow up and provided patient with direct contact information for care management team;      Provided patient with written educational materials related to hypo and hyperglycemia and importance of correct treatment. Denies any highs or lows.;       Reviewed scheduled/upcoming provider appointments including: 02-09-2023 at 940 am;         Advised patient, providing education and rationale, to check cbg once daily and when you have symptoms of low or high blood sugar and record. Education on fasting of <130 and 2 hours after eating of <180     Referral made to pharmacy team for assistance with update on Januvia. The patient has been approved for [employer]  and will get it for the year 2024. Works with the pharm D on a regular basis. Knows about updated pharm D changes. Review of patient status, including review of consultants reports, relevant laboratory and other test results, and medications completed;       Advised patient to discuss changes in DM, questions, or concerns with provider;      Screening for signs and symptoms of depression related to chronic disease state;        Assessed social determinant of health barriers;         Symptom  Management: Take medications as prescribed   Attend all scheduled provider appointments Call provider office for new concerns or questions  call the Suicide and Crisis Lifeline: 988 call the [place] National Suicide Prevention Lifeline: (740) 680-9886 or TTY: 302-710-1421 TTY 865-817-6370) to talk to a trained counselor call 1-800-273-TALK (toll free, 24 hour hotline) if experiencing a Mental Health or Behavioral Health Crisis  check feet daily for cuts, sores or redness trim toenails straight across manage portion size wash and dry feet carefully every day wear comfortable, cotton socks wear comfortable, well-fitting shoes  Follow Up Plan: Telephone follow up appointment with care management team member scheduled for: 02-04-2023 at 9 am       CCM Expected Outcome:  Monitor, Self-Manage and Reduce Symptoms of Heart Failure       Current Barriers:  Knowledge Deficits related to the importance of daily weights in the effective management of HF Chronic Disease Management support and education needs related to effective management of HF Financial Constraints.  Difficulty obtaining medications  Wt Readings from Last 3 Encounters:  11/09/22 196 lb 6.4 oz (89.1 kg)  08/10/22 200 lb (90.7 kg)  05/11/22 201 lb 8 oz (91.4 kg)     Planned Interventions: Basic overview and discussion of pathophysiology of Heart Failure reviewed. States his heart failure is stable. Denies any swelling in his feet of legs.  Denies any acute fluctuations in weight. Knows to call the provider for 2/3+ in one day and +5 in one week. The patient has a pacemaker and he has it checked regularly Provided education on low sodium diet. Review and education provided. The patient is eating healthier. Discussed staying hydrated but monitoring for fluid overload. Education and support given.  Reviewed Heart Failure Action Plan in depth and provided written copy Assessed need for readable accurate scales in home Provided  education about placing scale on hard, flat surface Advised patient to weigh each morning after emptying bladder Discussed importance of daily weight and advised patient to weigh and record daily Reviewed role of diuretics in prevention of fluid overload and management of heart failure Discussed the importance of keeping all appointments with provider.  Sees cardiologist on a regular basis. Denies any current issues. Feels his chronic conditions are stable.  Provided patient with education about the role of exercise in the management of heart failure. The patient is walking every day for at least 30 minutes and is doing this every day.  He says he does a brisk 3 miles at times. On days that it is cold he walks of the treadmill that is in his home. Praised for good habits and keeping an exercise routine Advised patient to discuss changes in heart failure or heart health with provider Screening for signs and symptoms of depression related to chronic disease state  Assessed social determinant of health barriers Pharm D works with the patient on a regular basis. He has been approved for  Entresto for 2024. He has his supply.Did have to call the company in July to get his next shipment. Review of pharm D help and support. The patient states today that he has all of his medications and he is taking as prescribed.   Symptom Management: Take medications as prescribed   Attend all scheduled provider appointments Call pharmacy for medication refills 3-7 days in advance of running out of medications Call provider office for new concerns or questions  call the Suicide and Crisis Lifeline: 988 call the [place] National Suicide Prevention Lifeline: 832-716-7993 or TTY: 209 689 4476 TTY 9044548739) to talk to a trained counselor call 1-800-273-TALK (toll free, 24 hour hotline) if experiencing a Mental Health or Behavioral Health Crisis  call office if I gain more than 2 pounds in one day or 5 pounds in one  week use salt in moderation watch for swelling in feet, ankles and legs every day weigh myself daily develop a rescue plan follow rescue plan if symptoms flare-up track symptoms and what helps feel better or worse dress right for the weather, hot or cold  Follow Up Plan: Telephone follow up appointment with care management team member scheduled for: 02-04-2023 at 0900 am       CCM Expected Outcome:  Monitor, Self-Manage, and Reduce Symptoms of Hypertension       Current Barriers:  Knowledge Deficits related to the importance of monitoring blood pressures on a regular basis to be proactive in monitoring for changes and the increased risk of heart attack and stroke with blood pressures out of range. Chronic Disease Management support and education needs related to effective management of HTN  BP Readings from Last 3 Encounters:  11/09/22 120/75  08/10/22 135/82  05/11/22 122/62   Planned Interventions: Evaluation of current treatment plan related to hypertension self management and patient's adherence to plan as established by provider. The patient with more stable blood pressures.The patient states that his blood pressures have been good at home. The patient denies any acute changes in HTN or heart healthy. Sees pcp and specialist on a regular basis;   Provided education to patient re: stroke prevention, s/s of heart attack and stroke. The patient is a little above goal on his cholesterol levels.Is taking Omega 3 fish oil or equivalent ; Reviewed prescribed diet heart healthy/ADA diet. Review of heart healthy/ADA diet. The patient states he is eating better and exercising daily by walking at least 30 mins a day.  He is doing well and feels great. Reviewed medications with patient and discussed importance of compliance. The patient is compliant with medications.The patient got approval for 2024 for his Entresto. The patient states he has his medications and denies any new concerns related to  medications at this time. Review of pharm D changes and the patient is aware of pharm D support if needed.  Discussed plans with patient for ongoing care management follow up and provided patient with direct contact information for care management team; Advised patient, providing education and rationale, to monitor blood pressure daily and record, calling PCP for findings outside established parameters;  Reviewed scheduled/upcoming provider appointments including: 02-09-2023 at 940 am Advised patient to discuss changes in blood pressures or questions/concerns about heart health with provider; Provided education on prescribed diet heart healthy/ADA diet. Review of sodium restrictions of <2000 mg daily ;  Discussed complications of poorly controlled blood pressure such as heart disease, stroke, circulatory complications, vision complications, kidney impairment, sexual dysfunction;  Screening for signs and symptoms of depression related  to chronic disease state;  Assessed social determinant of health barriers;   Symptom Management: Take medications as prescribed   Attend all scheduled provider appointments Call provider office for new concerns or questions  call the Suicide and Crisis Lifeline: 988 call the [place] National Suicide Prevention Lifeline: 9096265348 or TTY: 380-162-5346 TTY 623-595-9652) to talk to a trained counselor call 1-800-273-TALK (toll free, 24 hour hotline) if experiencing a Mental Health or Behavioral Health Crisis  check blood pressure weekly learn about high blood pressure keep a blood pressure log take blood pressure log to all doctor appointments call doctor for signs and symptoms of high blood pressure develop an action plan for high blood pressure keep all doctor appointments take medications for blood pressure exactly as prescribed report new symptoms to your doctor  Follow Up Plan: Telephone follow up appointment with care management team member scheduled for:  02-04-2023 at 0900 am          Patient verbalizes understanding of instructions and care plan provided today and agrees to view in MyChart. Active MyChart status and patient understanding of how to access instructions and care plan via MyChart confirmed with patient.  Telephone follow up appointment with care management team member scheduled for: 02-04-2023 at 0900 am

## 2022-12-08 DIAGNOSIS — I5020 Unspecified systolic (congestive) heart failure: Secondary | ICD-10-CM

## 2022-12-08 DIAGNOSIS — I110 Hypertensive heart disease with heart failure: Secondary | ICD-10-CM

## 2022-12-08 DIAGNOSIS — E1159 Type 2 diabetes mellitus with other circulatory complications: Secondary | ICD-10-CM | POA: Diagnosis not present

## 2022-12-13 ENCOUNTER — Other Ambulatory Visit: Payer: Medicare Other

## 2022-12-13 NOTE — Progress Notes (Signed)
12/13/2022 Name: Ryan [name] [name]: [id number] DOB: 1950-07-04  Chief Complaint  Patient presents with   Medication Management   Ryan [name] is a 72 y.o. year old male who presented for a telephone visit.   They were referred to the pharmacist by their PCP for assistance in managing medication access.   Subjective:  Care Team: Primary Care Provider: Marjie Skiff, NP ; Next Scheduled Visit: 10/2  Medication Access/Adherence  Current Pharmacy:  [facility] - [place], [place] - [address] [place] [address] Phone: [phone] Fax: [phone]  -Patient reports affordability concerns with their medications: No -PAP for Ryan [name], Januvia -Patient reports access/transportation concerns to their pharmacy: No  -Patient reports adherence concerns with their medications:  No    Diabetes: Current medications: Farxiga 10mg  daily, Januvia 100mg  daily  -Medication tried in the past:  Metformin and Rybelsus cause GI issues -A1c 7.3% 11/09/22 -Patient denies hypoglycemic s/sx including dizziness, shakiness, sweating.  -Patient denies hyperglycemic symptoms including polyuria, polydipsia, polyphagia, nocturia, neuropathy, blurred vision. -Current medication access support: Januvia and Farxiga PAP  Hypertension: Current medications: carvedilol 12.5mg  BID, Entresto 24/26mg  BID, spironolactone 12.5mg  daily -Current blood pressure readings readings: 120/75  -Patient denies hypotensive s/sx including dizziness, lightheadedness.  -Patient denies hypertensive symptoms including headache, chest pain, shortness of breath  Hyperlipidemia/ASCVD Risk Reduction Current lipid lowering medications: OTC Omega 3 500mg  daily -Medications tried in the past: Statins caused muscle aches  Objective: Lab Results  Component Value Date   HGBA1C 7.3 (H) 11/09/2022   Lab Results  Component Value Date   CREATININE 1.31 (H) 11/09/2022   BUN 21 11/09/2022   NA 138  11/09/2022   K 4.6 11/09/2022   CL 103 11/09/2022   CO2 20 11/09/2022   Lab Results  Component Value Date   CHOL 145 11/09/2022   HDL 30 (L) 11/09/2022   LDLCALC 67 11/09/2022   TRIG 303 (H) 11/09/2022   CHOLHDL 4.0 05/24/2018   Medications Reviewed Today     Reviewed by Lenna Gilford, RPH (Pharmacist) on 12/13/22 at [phone]  Med List Status: <None>   Medication Order Taking? Sig Documenting Provider Last Dose Status Informant  carvedilol (COREG) 12.5 MG tablet [id number] Yes Take 1 tablet (12.5 mg total) by mouth 2 (two) times daily. Aura Dials T, NP Taking Active   Cholecalciferol (VITAMIN D3 SUPER STRENGTH) 50 MCG (2000 UT) CAPS [id number] No Take 2,000 Units by mouth daily.  Patient not taking: Reported on 12/13/2022   [provider] Not Taking Active   dapagliflozin propanediol (FARXIGA) 10 MG TABS tablet [id number] Yes Take 10 mg by mouth daily. Aura Dials T, NP Taking Active            Med Note Littie Deeds,  A   Mon Dec 13, 2022  8:41 AM) PAP  dexlansoprazole (DEXILANT) 60 MG capsule [id number] Yes Take 1 capsule (60 mg total) by mouth daily. Aura Dials T, NP Taking Active   febuxostat (ULORIC) 40 MG tablet [id number] Yes Take 1 tablet (40 mg total) by mouth daily. Aura Dials T, NP Taking Active   levothyroxine (SYNTHROID) 50 MCG tablet [id number] Yes Take 1 tablet (50 mcg total) by mouth daily. Aura Dials T, NP Taking Active   Omega-3 Fatty Acids (FISH OIL) 500 MG CAPS [id number] Yes Take by mouth. [provider] Taking Active   sacubitril-valsartan (ENTRESTO) 24-26 MG [id number] Yes Take 1 tablet by mouth 2 (two) times daily.  Aura Dials T, NP Taking Active            Med Note Littie Deeds,  A   Mon Dec 13, 2022  8:40 AM) PAP  sitaGLIPtin (JANUVIA) 100 MG tablet [id number] Yes Take 1 tablet (100 mg total) by mouth daily. Aura Dials T, NP Taking Active            Med Note Littie Deeds,  A   Mon Dec 13, 2022  8:40 AM) PAP   spironolactone (ALDACTONE) 25 MG tablet [id number] Yes Take 1 tablet (25 mg total) by mouth daily.  Patient taking differently: Take 12.5 mg by mouth daily.   Aura Dials T, NP Taking Active            Assessment/Plan:   Diabetes: - Currently moderately controlled - Likely that lifestyle modifications could achieve A1c <7; would not recommend additional pharmacotherapy at this time  Hypertension: - Currently controlled - Continue current regimen and regular follow-up with providers  Hyperlipidemia/ASCVD Risk Reduction: - Currently moderately controlled.  - Patient recently started taking OTC Omega 3 to assist with lowering of TG's - Recommend follow-up lipid panel at next visit.  If TG's remain elevated, could consider fenofibrate therapy  Follow Up Plan: Medications that patient receives through PAP have been added to spreadsheet, so medication assistance team can help with re-enrollment.  No follow-up scheduled but can as recommended per PCP.  Lenna Gilford, PharmD, DPLA

## 2023-01-26 ENCOUNTER — Ambulatory Visit (INDEPENDENT_AMBULATORY_CARE_PROVIDER_SITE_OTHER): Payer: Medicare Other

## 2023-01-26 DIAGNOSIS — I428 Other cardiomyopathies: Secondary | ICD-10-CM | POA: Diagnosis not present

## 2023-01-26 LAB — CUP PACEART REMOTE DEVICE CHECK
Battery Remaining Longevity: 16 mo
Battery Voltage: 2.9 V
Brady Statistic AP VP Percent: 3.5 %
Brady Statistic AP VS Percent: 0.01 %
Brady Statistic AS VP Percent: 96.5 %
Brady Statistic AS VS Percent: 0 %
Brady Statistic RA Percent Paced: 3.48 %
Brady Statistic RV Percent Paced: 99.99 %
Date Time Interrogation Session: 20240918073625
HighPow Impedance: 48 Ohm
HighPow Impedance: 60 Ohm
Implantable Lead Connection Status: 753985
Implantable Lead Connection Status: 753985
Implantable Lead Connection Status: 753985
Implantable Lead Implant Date: 20060505
Implantable Lead Implant Date: 20120111
Implantable Lead Implant Date: 20180314
Implantable Lead Location: 753858
Implantable Lead Location: 753859
Implantable Lead Location: 753860
Implantable Lead Model: 5076
Implantable Lead Model: 6947
Implantable Pulse Generator Implant Date: 20180314
Lead Channel Impedance Value: 216.848
Lead Channel Impedance Value: 222.34 Ohm
Lead Channel Impedance Value: 237.865
Lead Channel Impedance Value: 244.491
Lead Channel Impedance Value: 262.946
Lead Channel Impedance Value: 399 Ohm
Lead Channel Impedance Value: 399 Ohm
Lead Channel Impedance Value: 418 Ohm
Lead Channel Impedance Value: 475 Ohm
Lead Channel Impedance Value: 532 Ohm
Lead Channel Impedance Value: 551 Ohm
Lead Channel Impedance Value: 589 Ohm
Lead Channel Impedance Value: 703 Ohm
Lead Channel Impedance Value: 760 Ohm
Lead Channel Impedance Value: 760 Ohm
Lead Channel Impedance Value: 836 Ohm
Lead Channel Impedance Value: 893 Ohm
Lead Channel Impedance Value: 931 Ohm
Lead Channel Pacing Threshold Amplitude: 0.625 V
Lead Channel Pacing Threshold Amplitude: 0.75 V
Lead Channel Pacing Threshold Amplitude: 1.375 V
Lead Channel Pacing Threshold Pulse Width: 0.4 ms
Lead Channel Pacing Threshold Pulse Width: 0.4 ms
Lead Channel Pacing Threshold Pulse Width: 0.4 ms
Lead Channel Sensing Intrinsic Amplitude: 0.5 mV
Lead Channel Sensing Intrinsic Amplitude: 0.5 mV
Lead Channel Setting Pacing Amplitude: 1.25 V
Lead Channel Setting Pacing Amplitude: 1.5 V
Lead Channel Setting Pacing Amplitude: 2 V
Lead Channel Setting Pacing Pulse Width: 0.4 ms
Lead Channel Setting Pacing Pulse Width: 0.4 ms
Lead Channel Setting Sensing Sensitivity: 0.3 mV
Zone Setting Status: 755011
Zone Setting Status: 755011

## 2023-02-01 NOTE — Progress Notes (Signed)
Remote ICD transmission.   

## 2023-02-04 ENCOUNTER — Telehealth: Payer: Medicare Other

## 2023-02-04 ENCOUNTER — Other Ambulatory Visit: Payer: Medicare Other

## 2023-02-04 ENCOUNTER — Other Ambulatory Visit: Payer: Self-pay

## 2023-02-04 NOTE — Patient Instructions (Signed)
Visit Information  Thank you for taking time to visit with me today. Please don't hesitate to contact me if I can be of assistance to you before our next scheduled telephone appointment.  Following are the goals we discussed today:   Goals Addressed             This Visit's Progress    RNCM Care Management Expected Outcome:  Monitor, Self-Manage and Reduce Symptoms of Diabetes       Current Barriers:  Chronic Disease Management support and education needs related to effective management of DM Financial Constraints.  Lab Results  Component Value Date   HGBA1C 7.3 (H) 11/09/2022     Planned Interventions: Provided education to patient about basic DM disease process. The patient still above goal but states he feels like he is doing a great job at taking care of his DM. Review of goal of A1c and the patient sees the pcp regularly. Will see the pcp next week for follow up and new A1C.  Reviewed medications with patient and discussed importance of medication adherence. The patient is taking [place] and Januvia. The patient was approved for assistance for Januvia. The patient wishes he could take other medications as they controlled his DM much better but he could not tolerate the side effects. Education and support given. Works with the pharm D on a regular basis.Spoke to the pharm D recently for effective medications management;        Reviewed prescribed diet with patient heart healthy/ADA diet. The patient states he is eating better since the holidays are over. He is also exercising. The patient states he is trying to be mindful of his dietary habits and eat better. Counseled on importance of regular laboratory monitoring as prescribed. Has labs on a regular basis. Most recent in July and will have new in October       Discussed plans with patient for ongoing care management follow up and provided patient with direct contact information for care management team;      Provided patient with  written educational materials related to hypo and hyperglycemia and importance of correct treatment. Denies any highs or lows.;       Reviewed scheduled/upcoming provider appointments including: 02-09-2023 at 940 am;         Advised patient, providing education and rationale, to check cbg once daily and when you have symptoms of low or high blood sugar and record. Education on fasting of <130 and 2 hours after eating of <180. States his range has been 140 to 180. Denies any acute findings.      Referral made to pharmacy team for assistance with update on Januvia. The patient has been approved for Januvia and will get it for the year 2024. Works with the pharm D on a regular basis. Spoke to the pharm D recently. Denies any new concerns with medications. Knows to call for changes.  Review of patient status, including review of consultants reports, relevant laboratory and other test results, and medications completed;       Advised patient to discuss changes in DM, questions, or concerns with provider;      Screening for signs and symptoms of depression related to chronic disease state;        Assessed social determinant of health barriers;         Symptom Management: Take medications as prescribed   Attend all scheduled provider appointments Call provider office for new concerns or questions  call the Suicide and  Crisis Lifeline: 988 call the [place] National Suicide Prevention Lifeline: 579-437-0643 or TTY: 867-314-7015 TTY (252)276-9367) to talk to a trained counselor call 1-800-273-TALK (toll free, 24 hour hotline) if experiencing a Mental Health or Behavioral Health Crisis  check feet daily for cuts, sores or redness trim toenails straight across manage portion size wash and dry feet carefully every day wear comfortable, cotton socks wear comfortable, well-fitting shoes  Follow Up Plan: Telephone follow up appointment with care management team member scheduled for: 04-14-2023 at 9 am        RNCM Care Management Expected Outcome:  Monitor, Self-Manage and Reduce Symptoms of Heart Failure       Current Barriers:  Knowledge Deficits related to the importance of daily weights in the effective management of HF Chronic Disease Management support and education needs related to effective management of HF Financial Constraints.  Difficulty obtaining medications  Wt Readings from Last 3 Encounters:  11/09/22 196 lb 6.4 oz (89.1 kg)  08/10/22 200 lb (90.7 kg)  05/11/22 201 lb 8 oz (91.4 kg)     Planned Interventions: Basic overview and discussion of pathophysiology of Heart Failure reviewed. States his heart failure is stable. Denies any swelling in his feet of legs.  Denies any acute fluctuations in weight. Knows to call the provider for 2/3+ in one day and +5 in one week. The patient has a pacemaker and he has it checked regularly Provided education on low sodium diet. Review and education provided. The patient is eating healthier. Discussed staying hydrated but monitoring for fluid overload. Education and support given.  Reviewed Heart Failure Action Plan in depth and provided written copy Assessed need for readable accurate scales in home Provided education about placing scale on hard, flat surface Advised patient to weigh each morning after emptying bladder Discussed importance of daily weight and advised patient to weigh and record daily Reviewed role of diuretics in prevention of fluid overload and management of heart failure Discussed the importance of keeping all appointments with provider.  Sees cardiologist on a regular basis. Denies any current issues. Feels his chronic conditions are stable.  Provided patient with education about the role of exercise in the management of heart failure. The patient is walking every day for at least 30 minutes and is doing this every day.  He says he does a brisk 3 miles at times. On days that it is cold he walks of the treadmill that is in his  home. Praised for good habits and keeping an exercise routine Advised patient to discuss changes in heart failure or heart health with provider Screening for signs and symptoms of depression related to chronic disease state  Assessed social determinant of health barriers Pharm D works with the patient on a regular basis. He has been approved for [facility] for 2024. He has his supply.Did have to call the company in July to get his next shipment. Review of pharm D help and support. The patient states today that he has all of his medications and he is taking as prescribed.   Symptom Management: Take medications as prescribed   Attend all scheduled provider appointments Call pharmacy for medication refills 3-7 days in advance of running out of medications Call provider office for new concerns or questions  call the Suicide and Crisis Lifeline: 988 call the [place] National Suicide Prevention Lifeline: (854)564-8771 or TTY: 9290832205 TTY 251 601 7650) to talk to a trained counselor call 1-800-273-TALK (toll free, 24 hour hotline) if experiencing a Mental Health or Behavioral  Health Crisis  call office if I gain more than 2 pounds in one day or 5 pounds in one week use salt in moderation watch for swelling in feet, ankles and legs every day weigh myself daily develop a rescue plan follow rescue plan if symptoms flare-up track symptoms and what helps feel better or worse dress right for the weather, hot or cold  Follow Up Plan: Telephone follow up appointment with care management team member scheduled for: 04-14-2023 at 0900 am       RNCM Care Management Expected Outcome:  Monitor, Self-Manage, and Reduce Symptoms of Hypertension       Current Barriers:  Knowledge Deficits related to the importance of monitoring blood pressures on a regular basis to be proactive in monitoring for changes and the increased risk of heart attack and stroke with blood pressures out of range. Chronic Disease  Management support and education needs related to effective management of HTN  BP Readings from Last 3 Encounters:  11/09/22 120/75  08/10/22 135/82  05/11/22 122/62   Planned Interventions: Evaluation of current treatment plan related to hypertension self management and patient's adherence to plan as established by provider. The patient with more stable blood pressures.The patient states that his blood pressures have been good at home. The patient denies any acute changes in HTN or heart healthy. Sees pcp and specialist on a regular basis;   Provided education to patient re: stroke prevention, s/s of heart attack and stroke. The patient is a little above goal on his cholesterol levels.Is taking Omega 3 fish oil or equivalent ; Reviewed prescribed diet heart healthy/ADA diet. Review of heart healthy/ADA diet. The patient states he is eating better and exercising daily by walking at least 30 mins a day.  He is doing well and feels great. Reviewed medications with patient and discussed importance of compliance. The patient is compliant with medications.The patient got approval for 2024 for his Entresto. The patient states he has his medications and denies any new concerns related to medications at this time. Review of pharm D changes and the patient is aware of pharm D support if needed.  Discussed plans with patient for ongoing care management follow up and provided patient with direct contact information for care management team; Advised patient, providing education and rationale, to monitor blood pressure daily and record, calling PCP for findings outside established parameters;  Reviewed scheduled/upcoming provider appointments including: 02-09-2023 at 940 am Advised patient to discuss changes in blood pressures or questions/concerns about heart health with provider; Provided education on prescribed diet heart healthy/ADA diet. Review of sodium restrictions of <2000 mg daily ;  Discussed  complications of poorly controlled blood pressure such as heart disease, stroke, circulatory complications, vision complications, kidney impairment, sexual dysfunction;  Screening for signs and symptoms of depression related to chronic disease state;  Assessed social determinant of health barriers;   Symptom Management: Take medications as prescribed   Attend all scheduled provider appointments Call provider office for new concerns or questions  call the Suicide and Crisis Lifeline: 988 call the [place] National Suicide Prevention Lifeline: (817) 864-7165 or TTY: (832)095-7330 TTY 575-863-6244) to talk to a trained counselor call 1-800-273-TALK (toll free, 24 hour hotline) if experiencing a Mental Health or Behavioral Health Crisis  check blood pressure weekly learn about high blood pressure keep a blood pressure log take blood pressure log to all doctor appointments call doctor for signs and symptoms of high blood pressure develop an action plan for high blood pressure keep  all doctor appointments take medications for blood pressure exactly as prescribed report new symptoms to your doctor  Follow Up Plan: Telephone follow up appointment with care management team member scheduled for: 04-14-2023 at 0900 am           Our next appointment is by telephone on 04-14-2023 at 0900 am  Please call the care guide team at [phone] if you need to cancel or reschedule your appointment.   If you are experiencing a Mental Health or Behavioral Health Crisis or need someone to talk to, please call the Suicide and Crisis Lifeline: 988 call the [place] National Suicide Prevention Lifeline: (586)742-2143 or TTY: 563-314-2563 TTY 954-385-7391) to talk to a trained counselor call 1-800-273-TALK (toll free, 24 hour hotline)   Patient verbalizes understanding of instructions and care plan provided today and agrees to view in MyChart. Active MyChart status and patient understanding of how to access  instructions and care plan via MyChart confirmed with patient.     Telephone follow up appointment with care management team member scheduled for: 04-14-2023 at 0900 am  Alto Denver RN, MSN, CCM RN Care Manager  [facility]  Ambulatory Care Management  Direct Number: [phone]

## 2023-02-04 NOTE — Patient Outreach (Signed)
Care Management   Visit Note  02/04/2023 Name: Ryan [name] [name]: [id number] DOB: 1950/09/03  Subjective: Ryan [name] is a 72 y.o. year old male who is a primary care patient of Cannady, [name], NP. The Care Management team was consulted for assistance.      Engaged with patient spoke with patient by telephone.    Goals Addressed             This Visit's Progress    RNCM Care Management Expected Outcome:  Monitor, Self-Manage and Reduce Symptoms of Diabetes       Current Barriers:  Chronic Disease Management support and education needs related to effective management of DM Financial Constraints.  Lab Results  Component Value Date   HGBA1C 7.3 (H) 11/09/2022     Planned Interventions: Provided education to patient about basic DM disease process. The patient still above goal but states he feels like he is doing a great job at taking care of his DM. Review of goal of A1c and the patient sees the pcp regularly. Will see the pcp next week for follow up and new A1C.  Reviewed medications with patient and discussed importance of medication adherence. The patient is taking [place] and Januvia. The patient was approved for assistance for Januvia. The patient wishes he could take other medications as they controlled his DM much better but he could not tolerate the side effects. Education and support given. Works with the pharm D on a regular basis.Spoke to the pharm D recently for effective medications management;        Reviewed prescribed diet with patient heart healthy/ADA diet. The patient states he is eating better since the holidays are over. He is also exercising. The patient states he is trying to be mindful of his dietary habits and eat better. Counseled on importance of regular laboratory monitoring as prescribed. Has labs on a regular basis. Most recent in July and will have new in October       Discussed plans with patient for ongoing care management follow up and provided  patient with direct contact information for care management team;      Provided patient with written educational materials related to hypo and hyperglycemia and importance of correct treatment. Denies any highs or lows.;       Reviewed scheduled/upcoming provider appointments including: 02-09-2023 at 940 am;         Advised patient, providing education and rationale, to check cbg once daily and when you have symptoms of low or high blood sugar and record. Education on fasting of <130 and 2 hours after eating of <180. States his range has been 140 to 180. Denies any acute findings.      Referral made to pharmacy team for assistance with update on Januvia. The patient has been approved for Januvia and will get it for the year 2024. Works with the pharm D on a regular basis. Spoke to the pharm D recently. Denies any new concerns with medications. Knows to call for changes.  Review of patient status, including review of consultants reports, relevant laboratory and other test results, and medications completed;       Advised patient to discuss changes in DM, questions, or concerns with provider;      Screening for signs and symptoms of depression related to chronic disease state;        Assessed social determinant of health barriers;         Symptom Management: Take medications as prescribed  Attend all scheduled provider appointments Call provider office for new concerns or questions  call the Suicide and Crisis Lifeline: 988 call the [place] National Suicide Prevention Lifeline: (343)151-6378 or TTY: 404-343-8058 TTY 425-051-4131) to talk to a trained counselor call 1-800-273-TALK (toll free, 24 hour hotline) if experiencing a Mental Health or Behavioral Health Crisis  check feet daily for cuts, sores or redness trim toenails straight across manage portion size wash and dry feet carefully every day wear comfortable, cotton socks wear comfortable, well-fitting shoes  Follow Up Plan: Telephone  follow up appointment with care management team member scheduled for: 04-14-2023 at 9 am       RNCM Care Management Expected Outcome:  Monitor, Self-Manage and Reduce Symptoms of Heart Failure       Current Barriers:  Knowledge Deficits related to the importance of daily weights in the effective management of HF Chronic Disease Management support and education needs related to effective management of HF Financial Constraints.  Difficulty obtaining medications  Wt Readings from Last 3 Encounters:  11/09/22 196 lb 6.4 oz (89.1 kg)  08/10/22 200 lb (90.7 kg)  05/11/22 201 lb 8 oz (91.4 kg)     Planned Interventions: Basic overview and discussion of pathophysiology of Heart Failure reviewed. States his heart failure is stable. Denies any swelling in his feet of legs.  Denies any acute fluctuations in weight. Knows to call the provider for 2/3+ in one day and +5 in one week. The patient has a pacemaker and he has it checked regularly Provided education on low sodium diet. Review and education provided. The patient is eating healthier. Discussed staying hydrated but monitoring for fluid overload. Education and support given.  Reviewed Heart Failure Action Plan in depth and provided written copy Assessed need for readable accurate scales in home Provided education about placing scale on hard, flat surface Advised patient to weigh each morning after emptying bladder Discussed importance of daily weight and advised patient to weigh and record daily Reviewed role of diuretics in prevention of fluid overload and management of heart failure Discussed the importance of keeping all appointments with provider.  Sees cardiologist on a regular basis. Denies any current issues. Feels his chronic conditions are stable.  Provided patient with education about the role of exercise in the management of heart failure. The patient is walking every day for at least 30 minutes and is doing this every day.  He says he  does a brisk 3 miles at times. On days that it is cold he walks of the treadmill that is in his home. Praised for good habits and keeping an exercise routine Advised patient to discuss changes in heart failure or heart health with provider Screening for signs and symptoms of depression related to chronic disease state  Assessed social determinant of health barriers Pharm D works with the patient on a regular basis. He has been approved for [facility] for 2024. He has his supply.Did have to call the company in July to get his next shipment. Review of pharm D help and support. The patient states today that he has all of his medications and he is taking as prescribed.   Symptom Management: Take medications as prescribed   Attend all scheduled provider appointments Call pharmacy for medication refills 3-7 days in advance of running out of medications Call provider office for new concerns or questions  call the Suicide and Crisis Lifeline: 988 call the [place] National Suicide Prevention Lifeline: 720-054-9593 or TTY: 581-053-9832 TTY 305-877-5001) to talk  to a trained counselor call 1-800-273-TALK (toll free, 24 hour hotline) if experiencing a Mental Health or Behavioral Health Crisis  call office if I gain more than 2 pounds in one day or 5 pounds in one week use salt in moderation watch for swelling in feet, ankles and legs every day weigh myself daily develop a rescue plan follow rescue plan if symptoms flare-up track symptoms and what helps feel better or worse dress right for the weather, hot or cold  Follow Up Plan: Telephone follow up appointment with care management team member scheduled for: 04-14-2023 at 0900 am       RNCM Care Management Expected Outcome:  Monitor, Self-Manage, and Reduce Symptoms of Hypertension       Current Barriers:  Knowledge Deficits related to the importance of monitoring blood pressures on a regular basis to be proactive in monitoring for changes and the  increased risk of heart attack and stroke with blood pressures out of range. Chronic Disease Management support and education needs related to effective management of HTN  BP Readings from Last 3 Encounters:  11/09/22 120/75  08/10/22 135/82  05/11/22 122/62   Planned Interventions: Evaluation of current treatment plan related to hypertension self management and patient's adherence to plan as established by provider. The patient with more stable blood pressures.The patient states that his blood pressures have been good at home. The patient denies any acute changes in HTN or heart healthy. Sees pcp and specialist on a regular basis;   Provided education to patient re: stroke prevention, s/s of heart attack and stroke. The patient is a little above goal on his cholesterol levels.Is taking Omega 3 fish oil or equivalent ; Reviewed prescribed diet heart healthy/ADA diet. Review of heart healthy/ADA diet. The patient states he is eating better and exercising daily by walking at least 30 mins a day.  He is doing well and feels great. Reviewed medications with patient and discussed importance of compliance. The patient is compliant with medications.The patient got approval for 2024 for his Entresto. The patient states he has his medications and denies any new concerns related to medications at this time. Review of pharm D changes and the patient is aware of pharm D support if needed.  Discussed plans with patient for ongoing care management follow up and provided patient with direct contact information for care management team; Advised patient, providing education and rationale, to monitor blood pressure daily and record, calling PCP for findings outside established parameters;  Reviewed scheduled/upcoming provider appointments including: 02-09-2023 at 940 am Advised patient to discuss changes in blood pressures or questions/concerns about heart health with provider; Provided education on prescribed diet  heart healthy/ADA diet. Review of sodium restrictions of <2000 mg daily ;  Discussed complications of poorly controlled blood pressure such as heart disease, stroke, circulatory complications, vision complications, kidney impairment, sexual dysfunction;  Screening for signs and symptoms of depression related to chronic disease state;  Assessed social determinant of health barriers;   Symptom Management: Take medications as prescribed   Attend all scheduled provider appointments Call provider office for new concerns or questions  call the Suicide and Crisis Lifeline: 988 call the [place] National Suicide Prevention Lifeline: 828-270-1249 or TTY: (760)499-7696 TTY 902-282-4366) to talk to a trained counselor call 1-800-273-TALK (toll free, 24 hour hotline) if experiencing a Mental Health or Behavioral Health Crisis  check blood pressure weekly learn about high blood pressure keep a blood pressure log take blood pressure log to all doctor appointments call  doctor for signs and symptoms of high blood pressure develop an action plan for high blood pressure keep all doctor appointments take medications for blood pressure exactly as prescribed report new symptoms to your doctor  Follow Up Plan: Telephone follow up appointment with care management team member scheduled for: 04-14-2023 at 0900 am             Consent to Services:  Patient was given information about care management services, agreed to services, and gave verbal consent to participate.   Plan: Telephone follow up appointment with care management team member scheduled for: 04-14-2023 at 0900 am  Alto Denver RN, MSN, CCM RN Care Manager  [facility]  Ambulatory Care Management  Direct Number: [phone]

## 2023-02-06 NOTE — Patient Instructions (Signed)
 Be Involved in Caring For Your Health:  Taking Medications When medications are taken as directed, they can greatly improve your health. But if they are not taken as prescribed, they may not work. In some cases, not taking them correctly can be harmful. To help ensure your treatment remains effective and safe, understand your medications and how to take them. Bring your medications to each visit for review by your provider.  Your lab results, notes, and after visit summary will be available on My Chart. We strongly encourage you to use this feature. If lab results are abnormal the clinic will contact you with the appropriate steps. If the clinic does not contact you assume the results are satisfactory. You can always view your results on My Chart. If you have questions regarding your health or results, please contact the clinic during office hours. You can also ask questions on My Chart.  We at [facility] are grateful that you chose [place] to provide your care. We strive to provide evidence-based and compassionate care and are always looking for feedback. If you get a survey from the clinic please complete this so we can hear your opinions.  Diabetes Mellitus and Nutrition, Adult When you have diabetes, or diabetes mellitus, it is very important to have healthy eating habits because your blood sugar (glucose) levels are greatly affected by what you eat and drink. Eating healthy foods in the right amounts, at about the same times every day, can help you: Manage your blood glucose. Lower your risk of heart disease. Improve your blood pressure. Reach or maintain a healthy weight. What can affect my meal plan? Every person with diabetes is different, and each person has different needs for a meal plan. Your health care provider may recommend that you work with a dietitian to make a meal plan that is best for you. Your meal plan may vary depending on factors such as: The calories you need. The  medicines you take. Your weight. Your blood glucose, blood pressure, and cholesterol levels. Your activity level. Other health conditions you have, such as heart or kidney disease. How do carbohydrates affect me? Carbohydrates, also called carbs, affect your blood glucose level more than any other type of food. Eating carbs raises the amount of glucose in your blood. It is important to know how many carbs you can safely have in each meal. This is different for every person. Your dietitian can help you calculate how many carbs you should have at each meal and for each snack. How does alcohol affect me? Alcohol can cause a decrease in blood glucose (hypoglycemia), especially if you use insulin or take certain diabetes medicines by mouth. Hypoglycemia can be a life-threatening condition. Symptoms of hypoglycemia, such as sleepiness, dizziness, and confusion, are similar to symptoms of having too much alcohol. Do not drink alcohol if: Your health care provider tells you not to drink. You are pregnant, may be pregnant, or are planning to become pregnant. If you drink alcohol: Limit how much you have to: 0-1 drink a day for women. 0-2 drinks a day for men. Know how much alcohol is in your drink. In the [place] one drink equals one 12 oz bottle of beer (355 mL), one 5 oz glass of wine (148 mL), or one 1 oz glass of hard liquor (44 mL). Keep yourself hydrated with water, diet soda, or unsweetened iced tea. Keep in mind that regular soda, juice, and other mixers may contain a lot of sugar and must  be counted as carbs. What are tips for following this plan?  Reading food labels Start by checking the serving size on the Nutrition Facts label of packaged foods and drinks. The number of calories and the amount of carbs, fats, and other nutrients listed on the label are based on one serving of the item. Many items contain more than one serving per package. Check the total grams (g) of carbs in one  serving. Check the number of grams of saturated fats and trans fats in one serving. Choose foods that have a low amount or none of these fats. Check the number of milligrams (mg) of salt (sodium) in one serving. Most people should limit total sodium intake to less than 2,300 mg per day. Always check the nutrition information of foods labeled as "low-fat" or "nonfat." These foods may be higher in added sugar or refined carbs and should be avoided. Talk to your dietitian to identify your daily goals for nutrients listed on the label. Shopping Avoid buying canned, pre-made, or processed foods. These foods tend to be high in fat, sodium, and added sugar. Shop around the outside edge of the grocery store. This is where you will most often find fresh fruits and vegetables, bulk grains, fresh meats, and fresh dairy products. Cooking Use low-heat cooking methods, such as baking, instead of high-heat cooking methods, such as deep frying. Cook using healthy oils, such as olive, canola, or sunflower oil. Avoid cooking with butter, cream, or high-fat meats. Meal planning Eat meals and snacks regularly, preferably at the same times every day. Avoid going long periods of time without eating. Eat foods that are high in fiber, such as fresh fruits, vegetables, beans, and whole grains. Eat 4-6 oz (112-168 g) of lean protein each day, such as lean meat, chicken, fish, eggs, or tofu. One ounce (oz) (28 g) of lean protein is equal to: 1 oz (28 g) of meat, chicken, or fish. 1 egg.  cup (62 g) of tofu. Eat some foods each day that contain healthy fats, such as avocado, nuts, seeds, and fish. What foods should I eat? Fruits Berries. Apples. Oranges. Peaches. Apricots. Plums. Grapes. Mangoes. Papayas. Pomegranates. Kiwi. Cherries. Vegetables Leafy greens, including lettuce, spinach, kale, chard, collard greens, mustard greens, and cabbage. Beets. Cauliflower. Broccoli. Carrots. Green beans. Tomatoes. Peppers.  Onions. Cucumbers. Brussels sprouts. Grains Whole grains, such as whole-wheat or whole-grain bread, crackers, tortillas, cereal, and pasta. Unsweetened oatmeal. Quinoa. Brown or wild rice. Meats and other proteins Seafood. Poultry without skin. Lean cuts of poultry and beef. Tofu. Nuts. Seeds. Dairy Low-fat or fat-free dairy products such as milk, yogurt, and cheese. The items listed above may not be a complete list of foods and beverages you can eat and drink. Contact a dietitian for more information. What foods should I avoid? Fruits Fruits canned with syrup. Vegetables Canned vegetables. Frozen vegetables with butter or cream sauce. Grains Refined white flour and flour products such as bread, pasta, snack foods, and cereals. Avoid all processed foods. Meats and other proteins Fatty cuts of meat. Poultry with skin. Breaded or fried meats. Processed meat. Avoid saturated fats. Dairy Full-fat yogurt, cheese, or milk. Beverages Sweetened drinks, such as soda or iced tea. The items listed above may not be a complete list of foods and beverages you should avoid. Contact a dietitian for more information. Questions to ask a health care provider Do I need to meet with a certified diabetes care and education specialist? Do I need to meet with a  dietitian? What number can I call if I have questions? When are the best times to check my blood glucose? Where to find more information: American Diabetes Association: diabetes.org Academy of Nutrition and Dietetics: eatright.[employer] of Diabetes and Digestive and Kidney Diseases: StageSync.si Association of Diabetes Care & Education Specialists: diabeteseducator.org Summary It is important to have healthy eating habits because your blood sugar (glucose) levels are greatly affected by what you eat and drink. It is important to use alcohol carefully. A healthy meal plan will help you manage your blood glucose and lower your risk of  heart disease. Your health care provider may recommend that you work with a dietitian to make a meal plan that is best for you. This information is not intended to replace advice given to you by your health care provider. Make sure you discuss any questions you have with your health care provider. Document Revised: 11/28/2019 Document Reviewed: 11/28/2019 Elsevier Patient Education  2024 [employer].

## 2023-02-09 ENCOUNTER — Encounter: Payer: Self-pay | Admitting: Nurse Practitioner

## 2023-02-09 ENCOUNTER — Telehealth: Payer: Self-pay

## 2023-02-09 ENCOUNTER — Ambulatory Visit (INDEPENDENT_AMBULATORY_CARE_PROVIDER_SITE_OTHER): Payer: Medicare Other | Admitting: Nurse Practitioner

## 2023-02-09 VITALS — BP 127/82 | HR 72 | Temp 98.1°F | Resp 16 | Ht 72.5 in | Wt 195.2 lb

## 2023-02-09 DIAGNOSIS — E1159 Type 2 diabetes mellitus with other circulatory complications: Secondary | ICD-10-CM | POA: Diagnosis not present

## 2023-02-09 DIAGNOSIS — E039 Hypothyroidism, unspecified: Secondary | ICD-10-CM | POA: Diagnosis not present

## 2023-02-09 DIAGNOSIS — I152 Hypertension secondary to endocrine disorders: Secondary | ICD-10-CM | POA: Diagnosis not present

## 2023-02-09 DIAGNOSIS — I5020 Unspecified systolic (congestive) heart failure: Secondary | ICD-10-CM

## 2023-02-09 DIAGNOSIS — Z7984 Long term (current) use of oral hypoglycemic drugs: Secondary | ICD-10-CM

## 2023-02-09 DIAGNOSIS — E785 Hyperlipidemia, unspecified: Secondary | ICD-10-CM | POA: Diagnosis not present

## 2023-02-09 DIAGNOSIS — E1122 Type 2 diabetes mellitus with diabetic chronic kidney disease: Secondary | ICD-10-CM | POA: Diagnosis not present

## 2023-02-09 DIAGNOSIS — G720 Drug-induced myopathy: Secondary | ICD-10-CM

## 2023-02-09 DIAGNOSIS — E6609 Other obesity due to excess calories: Secondary | ICD-10-CM

## 2023-02-09 DIAGNOSIS — Z2821 Immunization not carried out because of patient refusal: Secondary | ICD-10-CM

## 2023-02-09 DIAGNOSIS — Z6826 Body mass index (BMI) 26.0-26.9, adult: Secondary | ICD-10-CM

## 2023-02-09 DIAGNOSIS — E1169 Type 2 diabetes mellitus with other specified complication: Secondary | ICD-10-CM | POA: Diagnosis not present

## 2023-02-09 DIAGNOSIS — N183 Chronic kidney disease, stage 3 unspecified: Secondary | ICD-10-CM

## 2023-02-09 DIAGNOSIS — I5022 Chronic systolic (congestive) heart failure: Secondary | ICD-10-CM | POA: Diagnosis not present

## 2023-02-09 DIAGNOSIS — Z95810 Presence of automatic (implantable) cardiac defibrillator: Secondary | ICD-10-CM

## 2023-02-09 DIAGNOSIS — I429 Cardiomyopathy, unspecified: Secondary | ICD-10-CM

## 2023-02-09 DIAGNOSIS — Z0000 Encounter for general adult medical examination without abnormal findings: Secondary | ICD-10-CM

## 2023-02-09 NOTE — Assessment & Plan Note (Signed)
Chronic, stable.  Euvolemic today.  Continue current medication regimen and collaboration with cardiology.  LABS: CMP.   Recommend: - Reminded to call for an overnight weight gain of >2 pounds or a weekly weight weight of >5 pounds - not adding salt to his food and has been reading food labels. Reviewed the importance of keeping daily sodium intake to 2000mg  daily  - Avoid NSAIDS

## 2023-02-09 NOTE — Progress Notes (Signed)
Remote ICD transmission.   

## 2023-02-09 NOTE — Progress Notes (Signed)
ERROR

## 2023-02-09 NOTE — Assessment & Plan Note (Signed)
Chronic, ongoing.  Continue current medication regimen and adjust as needed based on labs.  TSH + Free T4 up to date and normal ranges.

## 2023-02-09 NOTE — Assessment & Plan Note (Signed)
Due to statin therapy, history of on low doses.  Would benefit from statin use with diabetes and heart health or consider injectable, discussed with him today.  

## 2023-02-09 NOTE — Assessment & Plan Note (Signed)
Chronic, stable.  BP at goal in office today.  Continue current medication regimen and collaboration with cardiology.  Recommend checking BP at home at least a few mornings a week + focus on DASH diet.  LABS: CMP.  Urine ALB 18 August 2022.  Return in 3 months.

## 2023-02-09 NOTE — Assessment & Plan Note (Signed)
Refuses all vaccinations and colonoscopy.

## 2023-02-09 NOTE — Assessment & Plan Note (Signed)
BMI 26.11.  Recommended eating smaller high protein, low fat meals more frequently and exercising 30 mins a day 5 times a week with a goal of 10-15lb weight loss in the next 3 months. Patient voiced their understanding and motivation to adhere to these recommendations.

## 2023-02-09 NOTE — Assessment & Plan Note (Signed)
Chronic, ongoing with HF.  A1c 7.3% in July, trend down from previous, urine ALB 10 in April 2024.  Rybelsus & Metformin caused GI issues.  Recheck A1c today and adjust regimen as needed. - Continue current medication regimen with Farxiga and Januvia 100 MG + continue collaboration with cardiology.   - Could consider Sulfonylurea at low dose if needed in future or a trial of injectable like Trulicity -- although concern for GI issues.  At this time he wishes not to add or change medications. - Recommend he check BS at home at least a few mornings a week with goal fasting <130, bring to next visit.   - Continue to collaborate with CCM crew.   - Up to date on eye and foot exam - Taking ARB, no statin (per cardiology this was stopped in past) - Refuses vaccinations.

## 2023-02-09 NOTE — Assessment & Plan Note (Signed)
Ongoing, stable, followed by cardiology.  Continue this collaboration. 

## 2023-02-09 NOTE — Progress Notes (Signed)
02/09/2023  Patient ID: Ryan [name], male   DOB: 09-07-1950, 72 y.o.   [name]: [id number]  Outreach attempt to discuss patient question/concern regarding renewal for AZ&Me Farxiga PAP.  I was not able to reach the patient, but his wife states he does check MyChart message.  Sending message via MyChart and including my direct phone number in case he needs to reach back out to me.  Lenna Gilford, PharmD, DPLA

## 2023-02-09 NOTE — Assessment & Plan Note (Signed)
Ongoing, 3a, however remains stable on labs over past year.  Recheck labs today. Urine ALB 18 August 2022 on check.  Consider referral to nephrology if worsening. 

## 2023-02-09 NOTE — Assessment & Plan Note (Signed)
Chronic, ongoing.  No current medication due to poor tolerance.   Would benefit from statin use with diabetes and heart health or consider injectable, discussed with him at visits.  Lipid panel today.  He is agreeable to trial of Rosuvastatin on 3 day or 1 day a week schedule if LDL >70 in future if needed.

## 2023-02-09 NOTE — Progress Notes (Signed)
BP 127/82 (BP Location: Right Arm, Patient Position: Sitting, Cuff Size: Normal)   Pulse 72   Temp 98.1 F (36.7 C) (Oral)   Resp 16   Ht 6' 0.5" (1.842 m) Comment: per patient  Wt 195 lb 3.2 oz (88.5 kg)   SpO2 99%   BMI 26.11 kg/m    Subjective:    Patient ID: Ryan [name], male    DOB: 24-Dec-1950, 72 y.o.   [name]: [id number]  HPI: Ryan [name] is a 72 y.o. male  Chief Complaint  Patient presents with   Follow-up   DIABETES A1c July was 7.3%, slight trend down.  Nona Dell (via assistance) + Januvia 100 MG daily (via assistance).  Had intolerance to Metformin and Rybelsus. Prefers no injectables. 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    His water has been condemned due to have high levels of PFOS, drinking bottled water and working with city and state -- put in charcoal reverse system. Supposed to have PFOS level of 4 per state and [place] has PFOS level of 8. He has well water and level was 156. Hypoglycemic episodes:no Polydipsia/polyuria: no Visual disturbance: no Chest pain: no Paresthesias: no Glucose Monitoring: no             Accucheck frequency: not checking             Fasting glucose:              Post prandial:             Evening:             Before meals: Taking Insulin?: no             Long acting insulin:             Short acting insulin: Blood Pressure Monitoring: daily Retinal Examination: Up to Date -- [place] Foot Exam: Up to Date Pneumovax: refuses Influenza:  refuses Aspirin: no    HYPERTENSION / HYPERLIPIDEMIA/HF Visits with cardiology, Dr. Graciela Husbands, last visit 02/09/22 -- they have discussed triglycerides and cholesterol at visits, he recommended continue current diet focus. No statin, cardiology took him off of this years ago. He is due for visit with them, but reports has not received letter. Has cardio-defib implantable (replacement last in January 2012) last check 10/27/22.  Has 1 year and 4 months on battery  left.  Taking Aldactone, Entresto (gets via assistance), Coreg.  Pravastatin in past with poor response -- myalgia.   Satisfied with current treatment? yes Duration of hypertension: chronic BP monitoring frequency: a few times a week BP range: 120/80 range BP medication side effects: no Duration of hyperlipidemia: chronic Medication compliance: good compliance Aspirin: no Recent stressors: no Recurrent headaches: no Visual changes: no Palpitations: no Dyspnea: no Chest pain: no Lower extremity edema: no Dizzy/lightheaded: no  The 10-year ASCVD risk score (Arnett DK, et al., 2019) is: 42.4%   Values used to calculate the score:     Age: 75 years     Sex: Male     Is Non-Hispanic African American: No     Diabetic: Yes     Tobacco smoker: No     Systolic Blood Pressure: 127 mmHg     Is BP treated: Yes     HDL Cholesterol: 30 mg/dL     Total Cholesterol: 145 mg/dL  CHRONIC KIDNEY DISEASE (CKD 3a) Stable CKD 3a since 2022. CKD status: stable Medications renally dose: yes Previous  renal evaluation: no Pneumovax:  refuses Influenza Vaccine:  refuses   HYPOTHYROIDISM Continues on Levothyroxine 50 MCG daily.   Thyroid control status:stable Satisfied with current treatment? yes Medication side effects: no Medication compliance: good compliance Etiology of hypothyroidism: Ryan Recent dose adjustment:no Fatigue: no Cold intolerance: no Heat intolerance: no Weight gain: no Weight loss: no Constipation: no Diarrhea/loose stools: no Palpitations: no Lower extremity edema: no Anxiety/depressed mood: no   Relevant past medical, surgical, family and social history reviewed and updated as indicated. Interim medical history since our last visit reviewed. Allergies and medications reviewed and updated.  Review of Systems  Constitutional:  Negative for activity change, diaphoresis, fatigue and fever.  Respiratory:  Negative for cough, chest tightness, shortness of breath  and wheezing.   Cardiovascular:  Negative for chest pain, palpitations and leg swelling.  Gastrointestinal: Negative.   Endocrine: Negative for polydipsia, polyphagia and polyuria.  Neurological: Negative.   Psychiatric/Behavioral: Negative.      Per HPI unless specifically indicated above     Objective:    BP 127/82 (BP Location: Right Arm, Patient Position: Sitting, Cuff Size: Normal)   Pulse 72   Temp 98.1 F (36.7 C) (Oral)   Resp 16   Ht 6' 0.5" (1.842 m) Comment: per patient  Wt 195 lb 3.2 oz (88.5 kg)   SpO2 99%   BMI 26.11 kg/m   Wt Readings from Last 3 Encounters:  02/09/23 195 lb 3.2 oz (88.5 kg)  11/09/22 196 lb 6.4 oz (89.1 kg)  08/10/22 200 lb (90.7 kg)    Physical Exam Vitals and nursing note reviewed.  Constitutional:      General: He is awake. He is not in acute distress.    Appearance: He is well-developed and well-groomed. He is not ill-appearing.  HENT:     Head: Normocephalic and atraumatic.     Right Ear: Hearing normal. No drainage.     Left Ear: Hearing normal. No drainage.  Eyes:     General: Lids are normal.        Right eye: No discharge.        Left eye: No discharge.     Conjunctiva/sclera: Conjunctivae normal.     Pupils: Pupils are equal, round, and reactive to light.  Neck:     Vascular: No carotid bruit.  Cardiovascular:     Rate and Rhythm: Normal rate and regular rhythm.     Heart sounds: Normal heart sounds, S1 normal and S2 normal. No murmur heard.    No gallop.  Pulmonary:     Effort: Pulmonary effort is normal. No accessory muscle usage or respiratory distress.     Breath sounds: Normal breath sounds.  Abdominal:     General: Bowel sounds are normal.     Palpations: Abdomen is soft.  Musculoskeletal:        General: Normal range of motion.     Cervical back: Normal range of motion and neck supple.     Right lower leg: No edema.     Left lower leg: No edema.  Skin:    General: Skin is warm and dry.     Capillary  Refill: Capillary refill takes less than 2 seconds.  Neurological:     Mental Status: He is alert and oriented to person, place, and time.  Psychiatric:        Attention and Perception: Attention normal.        Mood and Affect: Mood normal.  Speech: Speech normal.        Behavior: Behavior normal. Behavior is cooperative.        Thought Content: Thought content normal.     Results for orders placed or performed in visit on 01/26/23  CUP PACEART REMOTE DEVICE CHECK  Result Value Ref Range   Date Time Interrogation Session [id number]    Pulse Generator Manufacturer MERM    Pulse Gen Model [id number] Claria MRI Quad CRT-D    Pulse Gen Serial Number [id number]    Clinic Name [facility]    Implantable Pulse Generator Type Cardiac Resynch Therapy Defibulator    Implantable Pulse Generator Implant Date 66063016    Implantable Lead Manufacturer [facility]    Implantable Lead Model 1458Q Quartet    Implantable Lead Serial Number [id number]    Implantable Lead Implant Date 01093235    Implantable Lead Location Detail 1 Ryan    Implantable Lead Location [id number]    Implantable Lead Connection Status [id number]    Implantable Lead Manufacturer [facility]    Implantable Lead Model [id number] Sprint Quattro Secure    Implantable Lead Serial Number [id number]    Implantable Lead Implant Date 20254270    Implantable Lead Location [id number]    Implantable Lead Connection Status [id number]    Implantable Lead Manufacturer [employer]    Implantable Lead Model 5076 CapSureFix Novus    Implantable Lead Serial Number [id number]    Implantable Lead Implant Date 62376283    Implantable Lead Location [id number]    Implantable Lead Connection Status [id number]    Lead Channel Setting Sensing Sensitivity 0.3 mV   Lead Channel Setting Pacing Amplitude 1.25 V   Lead Channel Setting Pacing Pulse Width 0.4 ms   Lead Channel Setting Pacing Amplitude 1.5 V   Lead Channel Setting Pacing Pulse Width 0.4 ms   Lead Channel Setting Pacing  Amplitude 2 V   Lead Channel Setting Pacing Capture Mode Monitor Capture    Zone Setting Status Active    Zone Setting Status Inactive    Zone Setting Status Inactive    Zone Setting Status [id number]    Zone Setting Status [phone]    Lead Channel Impedance Value 399 ohm   Lead Channel Sensing Intrinsic Amplitude 0.5 mV   Lead Channel Sensing Intrinsic Amplitude 0.5 mV   Lead Channel Pacing Threshold Amplitude 0.625 V   Lead Channel Pacing Threshold Pulse Width 0.4 ms   Lead Channel Impedance Value 551 ohm   Lead Channel Impedance Value 532 ohm   Lead Channel Pacing Threshold Amplitude 0.75 V   Lead Channel Pacing Threshold Pulse Width 0.4 ms   HighPow Impedance 48 ohm   HighPow Impedance 60 ohm   Lead Channel Impedance Value 893 ohm   Lead Channel Impedance Value 836 ohm   Lead Channel Impedance Value 931 ohm   Lead Channel Impedance Value 703 ohm   Lead Channel Impedance Value 760 ohm   Lead Channel Impedance Value 760 ohm   Lead Channel Impedance Value 589 ohm   Lead Channel Impedance Value 399 ohm   Lead Channel Impedance Value 418 ohm   Lead Channel Impedance Value 475 ohm   Lead Channel Impedance Value 237.865    Lead Channel Impedance Value 244.491    Lead Channel Impedance Value 262.946    Lead Channel Impedance Value 216.848    Lead Channel Impedance Value 222.34 ohm   Lead Channel Pacing Threshold Amplitude 1.375 V   Lead Channel Pacing Threshold Pulse Width 0.4 ms  Battery Status OK    Battery Remaining Longevity 16 mo   Battery Voltage 2.90 V   Brady Statistic RA Percent Paced 3.48 %   Brady Statistic RV Percent Paced 99.99 %   Brady Statistic AP VP Percent 3.5 %   Brady Statistic AS VP Percent 96.5 %   Brady Statistic AP VS Percent 0.01 %   Brady Statistic AS VS Percent 0 %      Assessment & Plan:   Problem List Items Addressed This Visit       Cardiovascular and Mediastinum   Hypertension associated with diabetes (HCC)    Chronic, stable.  BP at goal in  office today.  Continue current medication regimen and collaboration with cardiology.  Recommend checking BP at home at least a few mornings a week + focus on DASH diet.  LABS: CMP.  Urine ALB 18 August 2022.  Return in 3 months.      Relevant Orders   HgB A1c   Comprehensive metabolic panel   Idiopathic cardiomyopathy (HCC)    Ongoing, stable, followed by cardiology.  Continue this collaboration.      Relevant Orders   Comprehensive metabolic panel   Lipid Panel w/o Chol/HDL Ratio   Systolic CHF (HCC)    Chronic, stable.  Euvolemic today.  Continue current medication regimen and collaboration with cardiology.  LABS: CMP.   Recommend: - Reminded to call for an overnight weight gain of >2 pounds or a weekly weight weight of >5 pounds - not adding salt to his food and has been reading food labels. Reviewed the importance of keeping daily sodium intake to 2000mg  daily  - Avoid NSAIDS      Relevant Orders   Comprehensive metabolic panel   Lipid Panel w/o Chol/HDL Ratio   Type 2 diabetes mellitus with cardiac complication (HCC) - Primary    Chronic, ongoing with HF.  A1c 7.3% in July, trend down from previous, urine ALB 10 in April 2024.  Rybelsus & Metformin caused GI issues.  Recheck A1c today and adjust regimen as needed. - Continue current medication regimen with Farxiga and Januvia 100 MG + continue collaboration with cardiology.   - Could consider Sulfonylurea at low dose if needed in future or a trial of injectable like Trulicity -- although concern for GI issues.  At this time he wishes not to add or change medications. - Recommend he check BS at home at least a few mornings a week with goal fasting <130, bring to next visit.   - Continue to collaborate with CCM crew.   - Up to date on eye and foot exam - Taking ARB, no statin (per cardiology this was stopped in past) - Refuses vaccinations.      Relevant Orders   HgB A1c   Comprehensive metabolic panel     Endocrine   CKD  stage 3 due to type 2 diabetes mellitus (HCC)    Ongoing, 3a, however remains stable on labs over past year.  Recheck labs today. Urine ALB 18 August 2022 on check.  Consider referral to nephrology if worsening.      Relevant Orders   HgB A1c   Comprehensive metabolic panel   Hyperlipidemia associated with type 2 diabetes mellitus (HCC)    Chronic, ongoing.  No current medication due to poor tolerance.   Would benefit from statin use with diabetes and heart health or consider injectable, discussed with him at visits.  Lipid panel today.  He is agreeable to trial of  Rosuvastatin on 3 day or 1 day a week schedule if LDL >70 in future if needed.      Relevant Orders   HgB A1c   Comprehensive metabolic panel   Lipid Panel w/o Chol/HDL Ratio   Hypothyroidism    Chronic, ongoing.  Continue current medication regimen and adjust as needed based on labs.  TSH + Free T4 up to date and normal ranges.        Musculoskeletal and Integument   Drug-induced myopathy    Due to statin therapy, history of on low doses.  Would benefit from statin use with diabetes and heart health or consider injectable, discussed with him today.         Other   BMI 26.0-26.9,adult    BMI 26.11.  Recommended eating smaller high protein, low fat meals more frequently and exercising 30 mins a day 5 times a week with a goal of 10-15lb weight loss in the next 3 months. Patient voiced their understanding and motivation to adhere to these recommendations.       No vaccination-pt refuse    Refuses all vaccinations and colonoscopy.        Follow up plan: Return in about 3 months (around 05/12/2023) for T2DM, HTN/HLD, THYROID + needs Medicare Wellness with nurse.

## 2023-02-10 LAB — COMPREHENSIVE METABOLIC PANEL
ALT: 45 [IU]/L — ABNORMAL HIGH (ref 0–44)
AST: 26 [IU]/L (ref 0–40)
Albumin: 4.7 g/dL (ref 3.8–4.8)
Alkaline Phosphatase: 47 [IU]/L (ref 44–121)
BUN/Creatinine Ratio: 16 (ref 10–24)
BUN: 22 mg/dL (ref 8–27)
Bilirubin Total: 0.4 mg/dL (ref 0.0–1.2)
CO2: 20 mmol/L (ref 20–29)
Calcium: 10.1 mg/dL (ref 8.6–10.2)
Chloride: 102 mmol/L (ref 96–106)
Creatinine, Ser: 1.36 mg/dL — ABNORMAL HIGH (ref 0.76–1.27)
Globulin, Total: 2.5 g/dL (ref 1.5–4.5)
Glucose: 186 mg/dL — ABNORMAL HIGH (ref 70–99)
Potassium: 4.5 mmol/L (ref 3.5–5.2)
Sodium: 138 mmol/L (ref 134–144)
Total Protein: 7.2 g/dL (ref 6.0–8.5)
eGFR: 55 mL/min/{1.73_m2} — ABNORMAL LOW (ref >59)

## 2023-02-10 LAB — LIPID PANEL W/O CHOL/HDL RATIO
Cholesterol, Total: 151 mg/dL (ref 100–199)
HDL: 33 mg/dL — ABNORMAL LOW (ref >39)
LDL Chol Calc (NIH): 73 mg/dL (ref 0–99)
Triglycerides: 277 mg/dL — ABNORMAL HIGH (ref 0–149)
VLDL Cholesterol Cal: 45 mg/dL — ABNORMAL HIGH (ref 5–40)

## 2023-02-10 LAB — HEMOGLOBIN A1C
Est. average glucose Bld gHb Est-mCnc: 157 mg/dL
Hgb A1c MFr Bld: 7.1 % — ABNORMAL HIGH (ref 4.8–5.6)

## 2023-02-10 NOTE — Progress Notes (Signed)
Contacted via MyChart   Good afternoon Ryan [name], your labs have returned: - A1c is trending down from 7.3% to 7.1%.  Good news.  Continue current medications. - Kidney function continues to show Stage 3a kidney disease with no worsening.  Liver function has mild elevation in ALT, but overall stable. - Lipid panel continues to show stable LDL, but triglycerides are still elevated.  Are you taking Omega 3 supplement at home, if not I do recommend this.  Any questions? Keep being stellar!!  Thank you for allowing me to participate in your care.  I appreciate you. Kindest regards, Roque Schill

## 2023-02-11 NOTE — Progress Notes (Unsigned)
Cardiology Office Note Date:  02/14/2023  Patient ID:  Ryan [name], DOB 10-May-1951, [name] [id number] PCP:  Marjie Skiff, NP  Cardiologist:  None Electrophysiologist: Sherryl Manges, MD    Chief Complaint: CRT-D follow-up  History of Present Illness: Ryan [name] is a 72 y.o. male with PMH notable for NICM, HFmrEF, s/p CRT-D, parox AFib, HTN, T2DM; seen today for Sherryl Manges, MD for routine electrophysiology followup.  Originally had ICD placement d/t NICM, then developed CHB, and had CRT upgrade 2018. He last saw Dr. Graciela Husbands 02/2022, was doing well without complaints. No AFib by device, not on OAC.  On follow-up today, patient is feeling well. He denies chest pain, chest pressure. He parked far from the entrance this morning and walked in without SOB, lightheadedness, or dizziness. Does not think he has edema or extra fluid. 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 No activity limitations with farm work.    Device Information: MDT CRT-D, imp 2006 device upgrade 2018; dx CHF, LBBB St[employer] Jude LV lead  AAD History: none  Past Medical History:  Diagnosis Date   AICD (automatic cardioverter/defibrillator) present    Anxiety    Complete heart block (HCC)    Depression    Diabetes mellitus, type 2 (HCC)    Dual ICD (implantable cardiac defibrillator-Medtronic    a. 6949 implanted originally s/p [phone] lead & generator change out 05/2010; b. upgraded to MDT CRT-D in 07/2016   GERD (gastroesophageal reflux disease)    Gout    History of kidney stones    Hypertension    IBS (irritable bowel syndrome)    Kidney stones    Nonischemic cardiomyopathy (HCC)    a. Echo 07/2012 EF 35-40%, severe HK of the mid-distalanterior myocardium w/ AK of the distalinferior myocardium, GR1DD, mild MR, mild to mod dilated LA; b/ echo 06/2016 EF 35-40%, severe HK of the mid-apicalanteroseptal and anterior myocardium and severe HK of the apicalinferior myocardium along with AK  of the apical myocardium, GR1DD, calcified mitral annulus, mildly dilated left atrium   Paroxysmal atrial fibrillation (HCC)    a. CHADS2VASc => 3 (CHF, HTN, age x 1); b. on ASA only   Presence of permanent cardiac pacemaker    Syncope     Past Surgical History:  Procedure Laterality Date   BIV UPGRADE N/A 07/21/2016   Procedure: BiV Upgrade;  Surgeon: Duke Salvia, MD;  Location: [facility] [place] INVASIVE CV LAB;  Service: Cardiovascular;  Laterality: N/A;   CARDIAC DEFIBRILLATOR PLACEMENT     DIRECT LARYNGOSCOPY Right 07/14/2017   Procedure: MICRODIRECT LARYNGOSCOPY WITH EXCISION OF RIGHT VOCAL CORD LESION WITH MICRO FLAP TECHNIQUE;  Surgeon: Bud Face, MD;  Location: [facility] ORS;  Service: ENT;  Laterality: Right;   LARYNGOSCOPY Right 01/06/2017   Procedure: LARYNGOSCOPY WITH EXCISION OF RIGHT VOCAL CORD LESION;  Surgeon: Bud Face, MD;  Location: [facility] ORS;  Service: ENT;  Laterality: Right;   LITHOTRIPSY     UPPER ENDOSCOPY W/ ESOPHAGEAL MANOMETRY      Current Outpatient Medications  Medication Instructions   carvedilol (COREG) 12.5 mg, Oral, 2 times daily   dapagliflozin propanediol (FARXIGA) 10 MG TABS tablet Take 10 mg by mouth daily.   dexlansoprazole (DEXILANT) 60 mg, Oral, Daily   febuxostat (ULORIC) 40 mg, Oral, Daily   levothyroxine (SYNTHROID) 50 mcg, Oral, Daily   Omega-3 Fatty Acids (FISH OIL) 500 MG CAPS Oral   sacubitril-valsartan (ENTRESTO) 24-26 MG 1 tablet, Oral, 2  times daily   sitaGLIPtin (JANUVIA) 100 mg, Oral, Daily   spironolactone (ALDACTONE) 25 mg, Oral, Daily   Vitamin D3 Super Strength 2,000 Units, Daily    Social History:  The patient  reports that he quit smoking about 33 years ago. His smoking use included cigarettes. He has never used smokeless tobacco. He reports current alcohol use of about 7.0 standard drinks of alcohol per week. He reports that he does not use drugs.   Family History:  The patient's family history includes Breast cancer in his  mother; Cancer in his father and mother; Diabetes in his father, mother, and sister; Heart disease in his mother; Lung cancer in his father.  ROS:  Please see the history of present illness. All other systems are reviewed and otherwise negative.   PHYSICAL EXAM:  VS:  BP 128/78 (BP Location: Left Arm, Patient Position: Sitting)   Pulse 82   Ht 6' (1.829 m)   Wt 195 lb 6.4 oz (88.6 kg)   SpO2 98%   BMI 26.50 kg/m  BMI: Body mass index is 26.5 kg/m.  GEN- The patient is well appearing, alert and oriented x 3 today.   Lungs- Clear to ausculation bilaterally, normal work of breathing.  Heart- Regular rate and rhythm, no murmurs, rubs or gallops Extremities- No peripheral edema, warm, dry Skin-  device pocket well-healed, no tethering   Device interrogation done today and reviewed by myself:  Battery 15mon Lead thresholds, impedence, sensing stable  VP 100% Dependent down to VVI 40bpm No episodes No changes made today  EKG is ordered. Personal review of EKG from today shows:    EKG Interpretation Date/Time:  Monday February 14 2023 10:22:00 EDT Ventricular Rate:  72 PR Interval:  150 QRS Duration:  168 QT Interval:  458 QTC Calculation: 501 R Axis:   259  Text Interpretation: Atrial-sensed ventricular-paced rhythm Biventricular pacemaker detected Confirmed by Sherie Don [phone]) on 02/14/2023 10:27:26 AM    Recent Labs: 08/10/2022: Hemoglobin 15.6; Platelets 169; TSH 4.060 02/09/2023: ALT 45; BUN 22; Creatinine, Ser 1.36; Potassium 4.5; Sodium 138  02/09/2023: Cholesterol, Total 151; HDL 33; LDL Chol Calc (NIH) 73; Triglycerides 277   Estimated Creatinine Clearance: 53.9 mL/min (A) (by C-G formula based on SCr of 1.36 mg/dL (H)).   Wt Readings from Last 3 Encounters:  02/14/23 195 lb 6.4 oz (88.6 kg)  02/09/23 195 lb 3.2 oz (88.5 kg)  11/09/22 196 lb 6.4 oz (89.1 kg)     Additional studies reviewed include: Previous EP, cardiology notes.   TTE, 07/27/2017 - Left  ventricle: The cavity size was normal. Systolic function was mildly to moderately reduced. The estimated ejection fraction was in the range of 40% to 45%. Hypokinesis of the basal to mid inferior and inferolateral myocardium (not well visualized). Hypokinesis of the apical and anteroapical myocardium. Doppler parameters are consistent with abnormal left ventricular relaxation (grade 1 diastolic dysfunction).  - Left atrium: The atrium was normal in size.  - Right ventricle: Pacer wire or catheter noted in right ventricle. Systolic function was normal.  - Pulmonary arteries: Systolic pressure was within the normal range.   ASSESSMENT AND PLAN:  #) HFmrEF NYHA I symptoms Warm and dry on exam Good exercise tolerance GDMT: farxiga 10, entresto 24-26, coreg 12.5, spiro 25 Diuretic: none  #) LBBB #) CRT-D in situ Device functioning well, see paceart for details VP 100%        Current medicines are reviewed at length with the patient today.  The patient does not have concerns regarding his medicines.  The following changes were made today:  none  Labs/ tests ordered today include:  Orders Placed This Encounter  Procedures   EKG 12-Lead     Disposition: Follow up with Dr. Graciela Husbands or EP APP in 12 months   Signed, Sherie Don, NP  02/14/23  10:27 AM  Electrophysiology [facility]

## 2023-02-14 ENCOUNTER — Encounter: Payer: Self-pay | Admitting: Cardiology

## 2023-02-14 ENCOUNTER — Ambulatory Visit: Payer: Medicare Other | Attending: Cardiology | Admitting: Cardiology

## 2023-02-14 VITALS — BP 128/78 | HR 82 | Ht 72.0 in | Wt 195.4 lb

## 2023-02-14 DIAGNOSIS — I428 Other cardiomyopathies: Secondary | ICD-10-CM | POA: Insufficient documentation

## 2023-02-14 DIAGNOSIS — I10 Essential (primary) hypertension: Secondary | ICD-10-CM | POA: Diagnosis not present

## 2023-02-14 DIAGNOSIS — I480 Paroxysmal atrial fibrillation: Secondary | ICD-10-CM | POA: Insufficient documentation

## 2023-02-14 DIAGNOSIS — Z95810 Presence of automatic (implantable) cardiac defibrillator: Secondary | ICD-10-CM | POA: Diagnosis not present

## 2023-02-14 DIAGNOSIS — I5022 Chronic systolic (congestive) heart failure: Secondary | ICD-10-CM | POA: Diagnosis not present

## 2023-02-14 LAB — CUP PACEART INCLINIC DEVICE CHECK
Date Time Interrogation Session: 20241007111438
Implantable Lead Connection Status: 753985
Implantable Lead Connection Status: 753985
Implantable Lead Connection Status: 753985
Implantable Lead Implant Date: 20060505
Implantable Lead Implant Date: 20120111
Implantable Lead Implant Date: 20180314
Implantable Lead Location: 753858
Implantable Lead Location: 753859
Implantable Lead Location: 753860
Implantable Lead Model: 5076
Implantable Lead Model: 6947
Implantable Pulse Generator Implant Date: 20180314

## 2023-02-14 NOTE — Patient Instructions (Signed)
Medication Instructions:  The current medical regimen is effective;  continue present plan and medications as directed. Please refer to the Current Medication list given to you today.   *If you need a refill on your cardiac medications before your next appointment, please call your pharmacy*   Follow-Up: At [facility], you and your health needs are our priority.  As part of our continuing mission to provide you with exceptional heart care, we have created designated Provider Care Teams.  These Care Teams include your primary Cardiologist (physician) and Advanced Practice Providers (APPs -  Physician Assistants and Nurse Practitioners) who all work together to provide you with the care you need, when you need it.  We recommend signing up for the patient portal called "MyChart".  Sign up information is provided on this After Visit Summary.  MyChart is used to connect with patients for Virtual Visits (Telemedicine).  Patients are able to view lab/test results, encounter notes, upcoming appointments, etc.  Non-urgent messages can be sent to your provider as well.   To learn more about what you can do with MyChart, go to [email or web address].    Your next appointment:   12 month(s)  Provider:   Sherryl Manges, MD or Sherie Don, NP

## 2023-02-22 ENCOUNTER — Ambulatory Visit: Payer: Medicare Other | Admitting: Emergency Medicine

## 2023-02-22 VITALS — Ht 72.5 in | Wt 195.0 lb

## 2023-02-22 DIAGNOSIS — Z0000 Encounter for general adult medical examination without abnormal findings: Secondary | ICD-10-CM | POA: Diagnosis not present

## 2023-02-22 NOTE — Patient Instructions (Addendum)
[name] , Thank you for taking time to come for your Medicare Wellness Visit. I appreciate your ongoing commitment to your health goals. Please review the following plan we discussed and let me know if I can assist you in the future.   Referrals/Orders/Follow-Ups/Clinician Recommendations: Get tetanus vaccine at your earliest convenience.   This is a list of the screening recommended for you and due dates:  Health Maintenance  Topic Date Due   COVID-19 Vaccine (1) 02/22/2023*   Zoster (Shingles) Vaccine (1 of 2) 05/08/2023*   Colon Cancer Screening  05/12/2023*   Flu Shot  08/08/2023*   Pneumonia Vaccine (2 of 2 - PCV) 02/06/2024*   Yearly kidney health urinalysis for diabetes  08/10/2023   Complete foot exam   08/10/2023   Hemoglobin A1C  08/10/2023   Eye exam for diabetics  11/15/2023   Yearly kidney function blood test for diabetes  02/09/2024   Medicare Annual Wellness Visit  02/22/2024   Hepatitis C Screening  Completed   HPV Vaccine  Aged Out   DTaP/Tdap/Td vaccine  Discontinued  *Topic was postponed. The date shown is not the original due date.    Advanced directives: (Copy Requested) Please bring a copy of your health care power of attorney and living will to the office to be added to your chart at your convenience.  Next Medicare Annual Wellness Visit scheduled for next year: Yes, 02/28/24 @ 12:30pm

## 2023-02-22 NOTE — Progress Notes (Signed)
Subjective:   Ryan [name] is a 72 y.o. male who presents for Medicare Annual/Subsequent preventive examination.  Visit Complete: Virtual I connected with  Ryan [name] on 02/22/23 by a audio enabled telemedicine application and verified that I am speaking with the correct person using two identifiers.  Patient Location: Home  Provider Location: Office/Clinic  I discussed the limitations of evaluation and management by telemedicine. The patient expressed understanding and agreed to proceed.  Vital Signs: Because this visit was a virtual/telehealth visit, some criteria may be missing or patient reported. Any vitals not documented were not able to be obtained and vitals that have been documented are patient reported.   Cardiac Risk Factors include: advanced age (>56men, >45 women);male gender;diabetes mellitus;dyslipidemia;hypertension     Objective:    Today's Vitals   02/22/23 1247  Weight: 195 lb (88.5 kg)  Height: 6' 0.5" (1.842 m)   Body mass index is 26.08 kg/m.     02/22/2023    1:00 PM 02/04/2021    5:13 PM 01/17/2019   11:21 AM 07/05/2017   10:50 AM 04/25/2017    8:40 AM 07/21/2016   10:43 AM 04/14/2016    9:19 AM  Advanced Directives  Does Patient Have a Medical Advance Directive? Yes Yes Yes Yes Yes Yes Yes  Type of Estate agent of [place];Living will Living will Living will;Healthcare Power of [employer] Power of [place];Living will Healthcare Power of [place];Living will Healthcare Power of [place];Living will Living will  Does patient want to make changes to medical advance directive? No - Patient declined No - Patient declined  No - Patient declined  No - Patient declined No - Patient declined  Copy of Healthcare Power of Attorney in Chart? No - copy requested  No - copy requested No - copy requested No - copy requested No - copy requested     Current Medications (verified) Outpatient Encounter Medications as of 02/22/2023   Medication Sig   carvedilol (COREG) 12.5 MG tablet Take 1 tablet (12.5 mg total) by mouth 2 (two) times daily.   dapagliflozin propanediol (FARXIGA) 10 MG TABS tablet Take 10 mg by mouth daily.   dexlansoprazole (DEXILANT) 60 MG capsule Take 1 capsule (60 mg total) by mouth daily.   febuxostat (ULORIC) 40 MG tablet Take 1 tablet (40 mg total) by mouth daily.   levothyroxine (SYNTHROID) 50 MCG tablet Take 1 tablet (50 mcg total) by mouth daily.   Omega-3 Fatty Acids (FISH OIL) 500 MG CAPS Take by mouth.   sacubitril-valsartan (ENTRESTO) 24-26 MG Take 1 tablet by mouth 2 (two) times daily.   sitaGLIPtin (JANUVIA) 100 MG tablet Take 1 tablet (100 mg total) by mouth daily.   spironolactone (ALDACTONE) 25 MG tablet Take 1 tablet (25 mg total) by mouth daily. (Patient taking differently: Take 12.5 mg by mouth daily.)   Cholecalciferol (VITAMIN D3 SUPER STRENGTH) 50 MCG (2000 UT) CAPS Take 2,000 Units by mouth daily. (Patient not taking: Reported on 12/13/2022)   No facility-administered encounter medications on file as of 02/22/2023.    Allergies (verified) Ace inhibitors and Crestor [rosuvastatin calcium]   History: Past Medical History:  Diagnosis Date   AICD (automatic cardioverter/defibrillator) present    Anxiety    Complete heart block (HCC)    Depression    Diabetes mellitus, type 2 (HCC)    Dual ICD (implantable cardiac defibrillator-Medtronic    a. 6949 implanted originally s/p 8119 lead & generator change out 05/2010; b. upgraded to MDT CRT-D  in 07/2016   GERD (gastroesophageal reflux disease)    Gout    History of kidney stones    Hypertension    IBS (irritable bowel syndrome)    Kidney stones    Nonischemic cardiomyopathy (HCC)    a. Echo 07/2012 EF 35-40%, severe HK of the mid-distalanterior myocardium w/ AK of the distalinferior myocardium, GR1DD, mild MR, mild to mod dilated LA; b/ echo 06/2016 EF 35-40%, severe HK of the mid-apicalanteroseptal and anterior myocardium and  severe HK of the apicalinferior myocardium along with AK of the apical myocardium, GR1DD, calcified mitral annulus, mildly dilated left atrium   Paroxysmal atrial fibrillation (HCC)    a. CHADS2VASc => 3 (CHF, HTN, age x 1); b. on ASA only   Presence of permanent cardiac pacemaker    Syncope    Past Surgical History:  Procedure Laterality Date   BIV UPGRADE N/A 07/21/2016   Procedure: BiV Upgrade;  Surgeon: Duke Salvia, MD;  Location: [facility] INVASIVE CV LAB;  Service: Cardiovascular;  Laterality: N/A;   CARDIAC DEFIBRILLATOR PLACEMENT     DIRECT LARYNGOSCOPY Right 07/14/2017   Procedure: MICRODIRECT LARYNGOSCOPY WITH EXCISION OF RIGHT VOCAL CORD LESION WITH MICRO FLAP TECHNIQUE;  Surgeon: Bud Face, MD;  Location: [facility] ORS;  Service: ENT;  Laterality: Right;   LARYNGOSCOPY Right 01/06/2017   Procedure: LARYNGOSCOPY WITH EXCISION OF RIGHT VOCAL CORD LESION;  Surgeon: Bud Face, MD;  Location: [facility] ORS;  Service: ENT;  Laterality: Right;   LITHOTRIPSY     UPPER ENDOSCOPY W/ ESOPHAGEAL MANOMETRY     Family History  Problem Relation Age of Onset   Breast cancer Mother    Diabetes Mother    Heart disease Mother    Cancer Mother        breast   Lung cancer Father    Diabetes Father    Cancer Father        lung   Diabetes Sister    Social History   Socioeconomic History   Marital status: Married    Spouse name: Ryan [name]   Number of children: 1   Years of education: 12   Highest education level: 12th grade  Occupational History   Occupation: Self Employed    Employer: [employer]   Occupation: retired  Tobacco Use   Smoking status: Former    Current packs/day: 0.00    Average packs/day: 0.3 packs/day for 0.6 years (0.2 ttl pk-yrs)    Types: Cigarettes    Start date: 79    Quit date: 12/30/1989    Years since quitting: 33.1   Smokeless tobacco: Never  Vaping Use   Vaping status: Never Used  Substance and Sexual Activity   Alcohol use: Yes     Alcohol/week: 7.0 standard drinks of alcohol    Types: 7 Cans of beer per week    Comment: 1 beer every day   Drug use: No   Sexual activity: Yes  Other Topics Concern   Not on file  Social History Narrative   Not on file   Social Determinants of Health   Financial Resource Strain: Low Risk  (02/22/2023)   Overall Financial Resource Strain (CARDIA)    Difficulty of Paying Living Expenses: Not hard at all  Food Insecurity: No Food Insecurity (02/22/2023)   Hunger Vital Sign    Worried About Running Out of Food in the Last Year: Never true    Ran Out of Food in the Last Year: Never true  Transportation Needs: No Transportation  Needs (02/22/2023)   PRAPARE - Administrator, Civil Service (Medical): No    Lack of Transportation (Non-Medical): No  Physical Activity: Sufficiently Active (02/22/2023)   Exercise Vital Sign    Days of Exercise per Week: 7 days    Minutes of Exercise per Session: 30 min  Stress: No Stress Concern Present (02/22/2023)   [employer] of Occupational Health - Occupational Stress Questionnaire    Feeling of Stress : Not at all  Social Connections: Moderately Integrated (02/22/2023)   Social Connection and Isolation Panel [NHANES]    Frequency of Communication with Friends and Family: More than three times a week    Frequency of Social Gatherings with Friends and Family: More than three times a week    Attends Religious Services: Never    Database administrator or Organizations: Yes    Attends Engineer, structural: 1 to 4 times per year    Marital Status: Married    Tobacco Counseling Counseling given: Not Answered   Clinical Intake:  Pre-visit preparation completed: Yes  Pain : No/denies pain     BMI - recorded: 26.08 Nutritional Status: BMI 25 -29 Overweight Nutritional Risks: None Diabetes: Yes CBG done?: No Did pt. bring in CBG monitor from home?: No  How often do you need to have someone help you when you read  instructions, pamphlets, or other written materials from your doctor or pharmacy?: 1 - Never  Interpreter Needed?: No  Information entered by :: Tora Kindred, CMA   Activities of Daily Living    02/22/2023   12:49 PM 02/09/2023    9:36 AM  In your present state of health, do you have any difficulty performing the following activities:  Hearing? 0 0  Vision? 0 0  Difficulty concentrating or making decisions? 0 0  Walking or climbing stairs? 0 0  Dressing or bathing? 0 0  Doing errands, shopping? 0 0  Preparing Food and eating ? N   Using the Toilet? N   In the past six months, have you accidently leaked urine? N   Do you have problems with loss of bowel control? N   Managing your Medications? N   Managing your Finances? N   Housekeeping or managing your Housekeeping? N     Patient Care Team: Marjie Skiff, NP as PCP - General (Nurse Practitioner) Duke Salvia, MD as PCP - Electrophysiology (Cardiology) Bud Face, MD as Referring Physician (Otolaryngology) Duke Salvia, MD as Consulting Physician (Cardiology) Marlowe Sax, RN as Case Manager (General Practice) [facility] (Ophthalmology)  Indicate any recent Medical Services you may have received from other than [facility] providers in the past year (date may be approximate).     Assessment:   This is a routine wellness examination for Boss.  Hearing/Vision screen Hearing Screening - Comments:: Denies hearing loss Vision Screening - Comments:: Gets routine eye exams   Goals Addressed               This Visit's Progress     Patient Stated (pt-stated)        Keep A1C below 7.5      Depression Screen    02/22/2023   12:56 PM 02/09/2023    9:35 AM 11/09/2022    8:33 AM 08/10/2022   10:03 AM 04/09/2022    9:25 AM 02/05/2022   10:09 AM 01/25/2022   10:19 AM  PHQ 2/9 Scores  PHQ - 2 Score 0 0  0 0 0 0 0  PHQ- 9 Score 0 0 0 0  0 0    Fall Risk    02/22/2023    1:01 PM 02/09/2023     9:35 AM 11/09/2022    8:33 AM 10/08/2022    9:40 AM 08/10/2022   10:02 AM  Fall Risk   Falls in the past year? 0 0 0 0 0  Number falls in past yr: 0  0 0 0  Injury with Fall? 0  0 0 0  Risk for fall due to : No Fall Risks No Fall Risks No Fall Risks  No Fall Risks  Follow up Falls prevention discussed Falls prevention discussed Falls evaluation completed Falls evaluation completed;Education provided     MEDICARE RISK AT HOME: Medicare Risk at Home Any stairs in or around the home?: Yes If so, are there any without handrails?: No Home free of loose throw rugs in walkways, pet beds, electrical cords, etc?: Yes Adequate lighting in your home to reduce risk of falls?: Yes Life alert?: No Use of a cane, walker or w/c?: No Grab bars in the bathroom?: Yes Shower chair or bench in shower?: No Elevated toilet seat or a handicapped toilet?: Yes  TIMED UP AND GO:  Was the test performed?  No    Cognitive Function:        02/22/2023    1:03 PM 02/05/2022   10:12 AM 02/04/2021    5:13 PM 05/29/2019    8:53 AM 04/25/2017    8:30 AM  6CIT Screen  What Year? 0 points 0 points 0 points 0 points 0 points  What month? 0 points 0 points 0 points 0 points 0 points  What time? 0 points 0 points 0 points 0 points 0 points  Count back from 20 0 points 0 points 0 points 0 points 0 points  Months in reverse 0 points 0 points 0 points 0 points 0 points  Repeat phrase 0 points 0 points 0 points 0 points 0 points  Total Score 0 points 0 points 0 points 0 points 0 points    Immunizations Immunization History  Administered Date(s) Administered   Pneumococcal Polysaccharide-23 04/21/2005   Td 06/10/1996, 05/20/2010   Zoster, Live 11/04/2011    TDAP status: Due, Education has been provided regarding the importance of this vaccine. Advised may receive this vaccine at local pharmacy or Health Dept. Aware to provide a copy of the vaccination record if obtained from local pharmacy or Health Dept.  Verbalized acceptance and understanding.  Flu Vaccine status: Declined, Education has been provided regarding the importance of this vaccine but patient still declined. Advised may receive this vaccine at local pharmacy or Health Dept. Aware to provide a copy of the vaccination record if obtained from local pharmacy or Health Dept. Verbalized acceptance and understanding.  Pneumococcal vaccine status: Declined,  Education has been provided regarding the importance of this vaccine but patient still declined. Advised may receive this vaccine at local pharmacy or Health Dept. Aware to provide a copy of the vaccination record if obtained from local pharmacy or Health Dept. Verbalized acceptance and understanding.   Covid-19 vaccine status: Declined, Education has been provided regarding the importance of this vaccine but patient still declined. Advised may receive this vaccine at local pharmacy or Health Dept.or vaccine clinic. Aware to provide a copy of the vaccination record if obtained from local pharmacy or Health Dept. Verbalized acceptance and understanding.  Qualifies for Shingles Vaccine? Yes   Zostavax  completed No   Shingrix Completed?: No.    Education has been provided regarding the importance of this vaccine. Patient has been advised to call insurance company to determine out of pocket expense if they have not yet received this vaccine. Advised may also receive vaccine at local pharmacy or Health Dept. Verbalized acceptance and understanding.  Screening Tests Health Maintenance  Topic Date Due   COVID-19 Vaccine (1) 02/22/2023 (Originally 10/22/1955)   Zoster Vaccines- Shingrix (1 of 2) 05/08/2023 (Originally [birth year]21/1969)   Colonoscopy  05/12/2023 (Originally 10/22/1995)   INFLUENZA VACCINE  08/08/2023 (Originally 12/09/2022)   Pneumonia Vaccine 75+ Years old (2 of 2 - PCV) 02/06/2024 (Originally 10/22/2015)   Diabetic kidney evaluation - Urine ACR  08/10/2023   FOOT EXAM  08/10/2023    HEMOGLOBIN A1C  08/10/2023   OPHTHALMOLOGY EXAM  11/15/2023   Diabetic kidney evaluation - eGFR measurement  02/09/2024   Medicare Annual Wellness (AWV)  02/22/2024   Hepatitis C Screening  Completed   HPV VACCINES  Aged Out   DTaP/Tdap/Td  Discontinued    Health Maintenance  There are no preventive care reminders to display for this patient.   Colorectal cancer screening: Patient refused colonoscopy and cologuard  Lung Cancer Screening: (Low Dose CT Chest recommended if Age 76-80 years, 20 pack-year currently smoking OR have quit w/in 15years.) does not qualify.   Lung Cancer Screening Referral: n/a  Additional Screening:  Hepatitis C Screening: does not qualify; Completed 04/25/17  Vision Screening: Recommended annual ophthalmology exams for early detection of glaucoma and other disorders of the eye. Is the patient up to date with their annual eye exam?  Yes  Who is the provider or what is the name of the office in which the patient attends annual eye exams? Dr. Clydene Pugh If pt is not established with a provider, would they like to be referred to a provider to establish care? No .   Dental Screening: Recommended annual dental exams for proper oral hygiene  Diabetic Foot Exam: Diabetic Foot Exam: Completed 08/10/22  Community Resource Referral / Chronic Care Management: CRR required this visit?  No   CCM required this visit?  No     Plan:     I have personally reviewed and noted the following in the patient's chart:   Medical and social history Use of alcohol, tobacco or illicit drugs  Current medications and supplements including opioid prescriptions. Patient is not currently taking opioid prescriptions. Functional ability and status Nutritional status Physical activity Advanced directives List of other physicians Hospitalizations, surgeries, and ER visits in previous 12 months Vitals Screenings to include cognitive, depression, and falls Referrals and  appointments  In addition, I have reviewed and discussed with patient certain preventive protocols, quality metrics, and best practice recommendations. A written personalized care plan for preventive services as well as general preventive health recommendations were provided to patient.     Tora Kindred, CMA   02/22/2023   After Visit Summary: (MyChart) Due to this being a telephonic visit, the after visit summary with patients personalized plan was offered to patient via MyChart   Nurse Notes:  Needs tetanus shot Declined colonoscopy and cologuard Declined flu, pneumonia, covid and shingles vaccines. Declined DM & Nutrition education.

## 2023-03-16 NOTE — Progress Notes (Signed)
   03/16/2023  Patient ID: Ryan [name], male   DOB: [birth year], 72 y.o.   [name]: [id number]  Completed provider portion of Entresto PAP renewal application for 2025 and submitted via fax.  Contacting patient to schedule a time we can complete PAP renewals for Januvia and Farxiga, because these both end 05/10/23.  Lenna Gilford, PharmD, DPLA

## 2023-03-29 NOTE — Progress Notes (Signed)
   03/29/2023  Patient ID: Ryan [name], male   DOB: 1950-05-30, 72 y.o.   [name]: [id number]  Contacted [employer] to check on PAP renewal for Entresto, because application was "missing information."  Program requires that patient apply for and be denied LIS Medicare Extra Help prior to approval.  Patient is coming in to sign [place] and Januvia renewal paperwork tomorrow, and we will discuss LIS and apply if able.  If he would qualify, it may not be necessary to file PAP renewals for 2025  Lenna Gilford, PharmD, DPLA

## 2023-03-30 ENCOUNTER — Telehealth: Payer: Self-pay

## 2023-03-30 ENCOUNTER — Other Ambulatory Visit: Payer: Self-pay

## 2023-03-30 NOTE — Progress Notes (Signed)
   03/30/2023  Patient ID: Ryan [name], male   DOB: Sep 21, 1950, 72 y.o.   [name]: [id number]  Outreach attempt to reschedule this morning's visit to complete PAP paperwork.  We will actually also need to complete LIS application, which can be done over the phone ASAP.  I was not able to reach Mr. Hoggan, but HIPAA compliant voicemail was left with my direct phone number.  Lenna Gilford, PharmD, DPLA

## 2023-03-31 ENCOUNTER — Telehealth: Payer: Self-pay

## 2023-03-31 NOTE — Progress Notes (Signed)
   03/31/2023  Patient ID: Ryan [name], male   DOB: 1950/05/18, 72 y.o.   [name]: [id number]  Outreach to schedule visit to complete LIS Medicare Extra Help application (requirement for Entresto PAP) and to complete Farxiga and Januvia PAP renewal applications for 2025.  Ryan [name] will be coming in to [facility] to see me 12/4 at 9am.  Lenna Gilford, PharmD, DPLA

## 2023-04-13 ENCOUNTER — Other Ambulatory Visit: Payer: Self-pay

## 2023-04-13 NOTE — Progress Notes (Signed)
   04/13/2023  Patient ID: Ryan [name], male   DOB: 06-22-1950, 72 y.o.   [name]: [id number]  Patient presenting to complete [employer] PAP application for Farxiga 10mg  daily and [employer] PAP application for Januvia 100mg  daily.  Pre-filled information was reviewed, applications signed/dated by patient and PCP, and faxed to PAPs.  We also submitted LIS Extra Care application online, because [employer] PAP for Sherryll Burger is requiring a denial letter from the program prior to approving patient's 2025 re-enrollment application.  Lenna Gilford, PharmD, DPLA

## 2023-04-14 ENCOUNTER — Other Ambulatory Visit: Payer: Medicare Other

## 2023-04-14 ENCOUNTER — Ambulatory Visit: Payer: Self-pay | Admitting: *Deleted

## 2023-04-14 NOTE — Patient Outreach (Signed)
Care Coordination   Follow Up Visit Note   04/14/2023 Name: SHAHEER KINAS [name]: [id number] DOB: [birth year]  Lenward Chancellor is a 72 y.o. year old male who sees [place], Corrie Dandy T, NP for primary care. I spoke with  Lenward Chancellor by phone today.  What matters to the patients health and wellness today?  Report he is doing very well, all conditions currently controlled.  Denies any urgent concerns, encouraged to contact this care manager with questions.      Goals Addressed             This Visit's Progress    COMPLETED: RNCM Care Management Expected Outcome:  Monitor, Self-Manage and Reduce Symptoms of Diabetes   On track    Current Barriers:  Chronic Disease Management support and education needs related to effective management of DM Financial Constraints.  Lab Results  Component Value Date   HGBA1C 7.1 (H) 02/09/2023     Planned Interventions: Provided education to patient about basic DM disease process. The patient still above goal but states he feels like he is doing a great job at taking care of his DM. Review of goal of A1c and the patient sees the pcp regularly. Will see the pcp next week for follow up and new A1C.  Reviewed medications with patient and discussed importance of medication adherence. The patient is taking [place] and Januvia. The patient was approved for assistance for Januvia. The patient wishes he could take other medications as they controlled his DM much better but he could not tolerate the side effects. Education and support given. Works with the pharm D on a regular basis.Spoke to the pharm D recently for effective medications management;        Reviewed prescribed diet with patient heart healthy/ADA diet. The patient states he is eating better since the holidays are over. He is also exercising. The patient states he is trying to be mindful of his dietary habits and eat better. Counseled on importance of regular laboratory monitoring as prescribed. Has labs on a  regular basis. Most recent in July and will have new in October       Discussed plans with patient for ongoing care management follow up and provided patient with direct contact information for care management team;      Provided patient with written educational materials related to hypo and hyperglycemia and importance of correct treatment. Denies any highs or lows.;       Reviewed scheduled/upcoming provider appointments including: 02-09-2023 at 940 am;         Advised patient, providing education and rationale, to check cbg once daily and when you have symptoms of low or high blood sugar and record. Education on fasting of <130 and 2 hours after eating of <180. States his range has been 140 to 180. Denies any acute findings.      Referral made to pharmacy team for assistance with update on Januvia. The patient has been approved for Januvia and will get it for the year 2024. Works with the pharm D on a regular basis. Spoke to the pharm D recently. Denies any new concerns with medications. Knows to call for changes.  Review of patient status, including review of consultants reports, relevant laboratory and other test results, and medications completed;       Advised patient to discuss changes in DM, questions, or concerns with provider;      Screening for signs and symptoms of depression related to chronic disease state;  Assessed social determinant of health barriers;         Symptom Management: Take medications as prescribed   Attend all scheduled provider appointments Call provider office for new concerns or questions  call the Suicide and Crisis Lifeline: 988 call the [place] National Suicide Prevention Lifeline: 9374709042 or TTY: 416 007 4703 TTY 251-296-9333) to talk to a trained counselor call 1-800-273-TALK (toll free, 24 hour hotline) if experiencing a Mental Health or Behavioral Health Crisis  check feet daily for cuts, sores or redness trim toenails straight across manage  portion size wash and dry feet carefully every day wear comfortable, cotton socks wear comfortable, well-fitting shoes  Follow Up Plan: Telephone follow up appointment with care management team member scheduled for: 04-14-2023 at 9 am       COMPLETED: RNCM Care Management Expected Outcome:  Monitor, Self-Manage and Reduce Symptoms of Heart Failure   On track    Current Barriers:  Knowledge Deficits related to the importance of daily weights in the effective management of HF Chronic Disease Management support and education needs related to effective management of HF Financial Constraints.  Difficulty obtaining medications  Wt Readings from Last 3 Encounters:  02/22/23 195 lb (88.5 kg)  02/14/23 195 lb 6.4 oz (88.6 kg)  02/09/23 195 lb 3.2 oz (88.5 kg)     Planned Interventions: Basic overview and discussion of pathophysiology of Heart Failure reviewed. States his heart failure is stable. Denies any swelling in his feet of legs.  Denies any acute fluctuations in weight. Knows to call the provider for 2/3+ in one day and +5 in one week. The patient has a pacemaker and he has it checked regularly Provided education on low sodium diet. Review and education provided. The patient is eating healthier. Discussed staying hydrated but monitoring for fluid overload. Education and support given.  Reviewed Heart Failure Action Plan in depth and provided written copy Assessed need for readable accurate scales in home Provided education about placing scale on hard, flat surface Advised patient to weigh each morning after emptying bladder Discussed importance of daily weight and advised patient to weigh and record daily Reviewed role of diuretics in prevention of fluid overload and management of heart failure Discussed the importance of keeping all appointments with provider.  Sees cardiologist on a regular basis. Denies any current issues. Feels his chronic conditions are stable.  Provided patient with  education about the role of exercise in the management of heart failure. The patient is walking every day for at least 30 minutes and is doing this every day.  He says he does a brisk 3 miles at times. On days that it is cold he walks of the treadmill that is in his home. Praised for good habits and keeping an exercise routine Advised patient to discuss changes in heart failure or heart health with provider Screening for signs and symptoms of depression related to chronic disease state  Assessed social determinant of health barriers Pharm D works with the patient on a regular basis. He has been approved for [facility] for 2024. He has his supply.Did have to call the company in July to get his next shipment. Review of pharm D help and support. The patient states today that he has all of his medications and he is taking as prescribed.   Symptom Management: Take medications as prescribed   Attend all scheduled provider appointments Call pharmacy for medication refills 3-7 days in advance of running out of medications Call provider office for new concerns or  questions  call the Suicide and Crisis Lifeline: 988 call the [place] National Suicide Prevention Lifeline: (916)469-2579 or TTY: 346-224-0405 TTY (971)518-3824) to talk to a trained counselor call 1-800-273-TALK (toll free, 24 hour hotline) if experiencing a Mental Health or Behavioral Health Crisis  call office if I gain more than 2 pounds in one day or 5 pounds in one week use salt in moderation watch for swelling in feet, ankles and legs every day weigh myself daily develop a rescue plan follow rescue plan if symptoms flare-up track symptoms and what helps feel better or worse dress right for the weather, hot or cold  Follow Up Plan: Telephone follow up appointment with care management team member scheduled for: 04-14-2023 at 0900 am       COMPLETED: RNCM Care Management Expected Outcome:  Monitor, Self-Manage, and Reduce Symptoms of  Hypertension   On track    Current Barriers:  Knowledge Deficits related to the importance of monitoring blood pressures on a regular basis to be proactive in monitoring for changes and the increased risk of heart attack and stroke with blood pressures out of range. Chronic Disease Management support and education needs related to effective management of HTN  BP Readings from Last 3 Encounters:  02/14/23 128/78  02/09/23 127/82  11/09/22 120/75   Planned Interventions: Evaluation of current treatment plan related to hypertension self management and patient's adherence to plan as established by provider. The patient with more stable blood pressures.The patient states that his blood pressures have been good at home. The patient denies any acute changes in HTN or heart healthy. Sees pcp and specialist on a regular basis;   Provided education to patient re: stroke prevention, s/s of heart attack and stroke. The patient is a little above goal on his cholesterol levels.Is taking Omega 3 fish oil or equivalent ; Reviewed prescribed diet heart healthy/ADA diet. Review of heart healthy/ADA diet. The patient states he is eating better and exercising daily by walking at least 30 mins a day.  He is doing well and feels great. Reviewed medications with patient and discussed importance of compliance. The patient is compliant with medications.The patient got approval for 2024 for his Entresto. The patient states he has his medications and denies any new concerns related to medications at this time. Review of pharm D changes and the patient is aware of pharm D support if needed.  Discussed plans with patient for ongoing care management follow up and provided patient with direct contact information for care management team; Advised patient, providing education and rationale, to monitor blood pressure daily and record, calling PCP for findings outside established parameters;  Reviewed scheduled/upcoming provider  appointments including: 02-09-2023 at 940 am Advised patient to discuss changes in blood pressures or questions/concerns about heart health with provider; Provided education on prescribed diet heart healthy/ADA diet. Review of sodium restrictions of <2000 mg daily ;  Discussed complications of poorly controlled blood pressure such as heart disease, stroke, circulatory complications, vision complications, kidney impairment, sexual dysfunction;  Screening for signs and symptoms of depression related to chronic disease state;  Assessed social determinant of health barriers;   Symptom Management: Take medications as prescribed   Attend all scheduled provider appointments Call provider office for new concerns or questions  call the Suicide and Crisis Lifeline: 988 call the [place] National Suicide Prevention Lifeline: 302-841-8491 or TTY: 920-598-0220 TTY 8144122828) to talk to a trained counselor call 1-800-273-TALK (toll free, 24 hour hotline) if experiencing a Mental Health or Behavioral  Health Crisis  check blood pressure weekly learn about high blood pressure keep a blood pressure log take blood pressure log to all doctor appointments call doctor for signs and symptoms of high blood pressure develop an action plan for high blood pressure keep all doctor appointments take medications for blood pressure exactly as prescribed report new symptoms to your doctor  Follow Up Plan: Telephone follow up appointment with care management team member scheduled for: 04-14-2023 at 0900 am          SDOH assessments and interventions completed:  No     Care Coordination Interventions:  Yes, provided   Interventions Today    Flowsheet Row Most Recent Value  Chronic Disease   Chronic disease during today's visit Diabetes, Hypertension (HTN), Congestive Heart Failure (CHF)  General Interventions   General Interventions Discussed/Reviewed General Interventions Reviewed, Labs, Doctor Visits   [has pacer, expecting to need battery changed in the next few months]  Labs Hgb A1c every 3 months  [recent 7.1]  Doctor Visits Discussed/Reviewed Doctor Visits Reviewed, PCP, Specialist  [upcoming with  pharmacy team on 12/18,  PCP 1/2]  PCP/Specialist Visits Compliance with follow-up visit  Exercise Interventions   Exercise Discussed/Reviewed Physical Activity  [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  Using medication assistance for [name], and Januvia.]  Labs Reviewed Hgb A1c       Follow up plan: Follow up call scheduled for 3/6    Encounter Outcome:  Patient Visit Completed   Rodney Langton, RN, MSN, CCM [place]  [facility], [facility] RN Care Coordinator Direct Dial: [phone] / Main [phone] Fax [phone] Email: [email or web address]: [email or web address]

## 2023-04-21 ENCOUNTER — Other Ambulatory Visit: Payer: Self-pay | Admitting: Nurse Practitioner

## 2023-04-21 NOTE — Telephone Encounter (Signed)
Requested Prescriptions  Pending Prescriptions Disp Refills   JANUVIA 100 MG tablet [Pharmacy Med Name: JANUVIA 100MG  TAB] 90 tablet 1    Sig: TAKE ONE TABLET BY MOUTH EVERY DAY     Endocrinology:  Diabetes - DPP-4 Inhibitors Failed - 04/21/2023  4:56 PM      Failed - Cr in normal range and within 360 days    Creatinine, Ser  Date Value Ref Range Status  02/09/2023 1.36 (H) 0.76 - 1.27 mg/dL Final         Passed - HBA1C is between 0 and 7.9 and within 180 days    Hemoglobin A1C  Date Value Ref Range Status  11/08/2017 6.9  Final  11/08/2017 6.9  Final   HB A1C (BAYER DCA - WAIVED)  Date Value Ref Range Status  11/09/2022 7.3 (H) 4.8 - 5.6 % Final    Comment:             Prediabetes: 5.7 - 6.4          Diabetes: >6.4          Glycemic control for adults with diabetes: <7.0    Hgb A1c MFr Bld  Date Value Ref Range Status  02/09/2023 7.1 (H) 4.8 - 5.6 % Final    Comment:             Prediabetes: 5.7 - 6.4          Diabetes: >6.4          Glycemic control for adults with diabetes: <7.0          Passed - Valid encounter within last 6 months    Recent Outpatient Visits           2 months ago Type 2 diabetes mellitus with cardiac complication (HCC)   [place] [facility] [place], [name] T, NP   5 months ago Type 2 diabetes mellitus with cardiac complication (HCC)   [place] [facility] [place], [name] T, NP   8 months ago Type 2 diabetes mellitus with cardiac complication (HCC)   [place] [facility] [place], [name] T, NP   11 months ago Type 2 diabetes mellitus with cardiac complication (HCC)   [place] [facility] [place], Corrie Dandy T, NP   1 year ago Type 2 diabetes mellitus with cardiac complication [facility])   [place] [facility] Marjie Skiff, NP       Future Appointments             In 1 week Deirdre Evener, MD [facility]   In 3 weeks McN[place], [name], NP  [place] [facility]

## 2023-04-27 ENCOUNTER — Ambulatory Visit (INDEPENDENT_AMBULATORY_CARE_PROVIDER_SITE_OTHER): Payer: Medicare Other

## 2023-04-27 ENCOUNTER — Other Ambulatory Visit: Payer: Self-pay

## 2023-04-27 DIAGNOSIS — I428 Other cardiomyopathies: Secondary | ICD-10-CM

## 2023-04-27 DIAGNOSIS — I5022 Chronic systolic (congestive) heart failure: Secondary | ICD-10-CM | POA: Diagnosis not present

## 2023-04-27 LAB — CUP PACEART REMOTE DEVICE CHECK
Battery Remaining Longevity: 13 mo
Battery Voltage: 2.89 V
Brady Statistic AP VP Percent: 3.13 %
Brady Statistic AP VS Percent: 0.01 %
Brady Statistic AS VP Percent: 96.86 %
Brady Statistic AS VS Percent: 0 %
Brady Statistic RA Percent Paced: 3.13 %
Brady Statistic RV Percent Paced: 99.98 %
Date Time Interrogation Session: 20241218103726
HighPow Impedance: 50 Ohm
HighPow Impedance: 64 Ohm
Implantable Lead Connection Status: 753985
Implantable Lead Connection Status: 753985
Implantable Lead Connection Status: 753985
Implantable Lead Implant Date: 20060505
Implantable Lead Implant Date: 20120111
Implantable Lead Implant Date: 20180314
Implantable Lead Location: 753858
Implantable Lead Location: 753859
Implantable Lead Location: 753860
Implantable Lead Model: 5076
Implantable Lead Model: 6947
Implantable Pulse Generator Implant Date: 20180314
Lead Channel Impedance Value: 222.34 Ohm
Lead Channel Impedance Value: 222.34 Ohm
Lead Channel Impedance Value: 237.686
Lead Channel Impedance Value: 237.686
Lead Channel Impedance Value: 255.093
Lead Channel Impedance Value: 399 Ohm
Lead Channel Impedance Value: 418 Ohm
Lead Channel Impedance Value: 418 Ohm
Lead Channel Impedance Value: 475 Ohm
Lead Channel Impedance Value: 513 Ohm
Lead Channel Impedance Value: 551 Ohm
Lead Channel Impedance Value: 589 Ohm
Lead Channel Impedance Value: 665 Ohm
Lead Channel Impedance Value: 779 Ohm
Lead Channel Impedance Value: 817 Ohm
Lead Channel Impedance Value: 836 Ohm
Lead Channel Impedance Value: 931 Ohm
Lead Channel Impedance Value: 950 Ohm
Lead Channel Pacing Threshold Amplitude: 0.625 V
Lead Channel Pacing Threshold Amplitude: 0.625 V
Lead Channel Pacing Threshold Amplitude: 1.25 V
Lead Channel Pacing Threshold Pulse Width: 0.4 ms
Lead Channel Pacing Threshold Pulse Width: 0.4 ms
Lead Channel Pacing Threshold Pulse Width: 0.4 ms
Lead Channel Sensing Intrinsic Amplitude: 1.375 mV
Lead Channel Sensing Intrinsic Amplitude: 1.375 mV
Lead Channel Setting Pacing Amplitude: 1.25 V
Lead Channel Setting Pacing Amplitude: 1.5 V
Lead Channel Setting Pacing Amplitude: 2 V
Lead Channel Setting Pacing Pulse Width: 0.4 ms
Lead Channel Setting Pacing Pulse Width: 0.4 ms
Lead Channel Setting Sensing Sensitivity: 0.3 mV
Zone Setting Status: 755011
Zone Setting Status: 755011

## 2023-04-27 NOTE — Progress Notes (Signed)
   04/27/2023  Patient ID: Ryan [name], male   DOB: 11-21-1950, 72 y.o.   [name]: [id number]  Patient outreach to follow-up on [employer] Medicare Extra Help application submitted 2 weeks ago.  Denial letter required for Entresto PAP re-enrollment.  Patient has not yet heard anything from Social Security regarding the application, but he will call me as soon as he does.  Lenna Gilford, PharmD, DPLA

## 2023-04-28 ENCOUNTER — Ambulatory Visit: Payer: Medicare Other | Admitting: Dermatology

## 2023-04-28 ENCOUNTER — Encounter: Payer: Self-pay | Admitting: Dermatology

## 2023-04-28 DIAGNOSIS — L578 Other skin changes due to chronic exposure to nonionizing radiation: Secondary | ICD-10-CM

## 2023-04-28 DIAGNOSIS — Z872 Personal history of diseases of the skin and subcutaneous tissue: Secondary | ICD-10-CM | POA: Diagnosis not present

## 2023-04-28 DIAGNOSIS — Z85828 Personal history of other malignant neoplasm of skin: Secondary | ICD-10-CM | POA: Diagnosis not present

## 2023-04-28 DIAGNOSIS — L820 Inflamed seborrheic keratosis: Secondary | ICD-10-CM

## 2023-04-28 DIAGNOSIS — W908XXA Exposure to other nonionizing radiation, initial encounter: Secondary | ICD-10-CM

## 2023-04-28 DIAGNOSIS — D229 Melanocytic nevi, unspecified: Secondary | ICD-10-CM

## 2023-04-28 DIAGNOSIS — L821 Other seborrheic keratosis: Secondary | ICD-10-CM

## 2023-04-28 DIAGNOSIS — Z1283 Encounter for screening for malignant neoplasm of skin: Secondary | ICD-10-CM | POA: Diagnosis not present

## 2023-04-28 DIAGNOSIS — D1801 Hemangioma of skin and subcutaneous tissue: Secondary | ICD-10-CM | POA: Diagnosis not present

## 2023-04-28 DIAGNOSIS — L814 Other melanin hyperpigmentation: Secondary | ICD-10-CM | POA: Diagnosis not present

## 2023-04-28 NOTE — Patient Instructions (Signed)

## 2023-04-28 NOTE — Progress Notes (Signed)
   Follow-Up Visit   Subjective  Ryan [name] is a 72 y.o. male who presents for the following: Skin Cancer Screening and Full Body Skin Exam  The patient presents for Total-Body Skin Exam (TBSE) for skin cancer screening and mole check. The patient has spots, moles and lesions to be evaluated, some may be new or changing and the patient may have concern these could be cancer. History of AKs. History of SCCs x 2 of the chest treated years ago.    The following portions of the chart were reviewed this encounter and updated as appropriate: medications, allergies, medical history  Review of Systems:  No other skin or systemic complaints except as noted in HPI or Assessment and Plan.  Objective  Well appearing patient in no apparent distress; mood and affect are within normal limits.  A full examination was performed including scalp, head, eyes, ears, nose, lips, neck, chest, axillae, abdomen, back, buttocks, bilateral upper extremities, bilateral lower extremities, hands, feet, fingers, toes, fingernails, and toenails. All findings within normal limits unless otherwise noted below.   Relevant physical exam findings are noted in the Assessment and Plan.  back x >40 (40) Erythematous stuck-on, waxy papule or plaque  Assessment & Plan   SKIN CANCER SCREENING PERFORMED TODAY.  ACTINIC DAMAGE - Chronic condition, secondary to cumulative UV/sun exposure - diffuse scaly erythematous macules with underlying dyspigmentation - Recommend daily broad spectrum sunscreen SPF 30+ to sun-exposed areas, reapply every 2 hours as needed.  - Staying in the shade or wearing long sleeves, sun glasses (UVA+UVB protection) and wide brim hats (4-inch brim around the entire circumference of the hat) are also recommended for sun protection.  - Call for new or changing lesions.  LENTIGINES, SEBORRHEIC KERATOSES, HEMANGIOMAS - Benign normal skin lesions - Benign-appearing - Call for any changes  MELANOCYTIC  NEVI - Tan-brown and/or pink-flesh-colored symmetric macules and papules - Benign appearing on exam today - Observation - Call clinic for new or changing moles - Recommend daily use of broad spectrum spf 30+ sunscreen to sun-exposed areas.   HISTORY OF SKIN CANCER Chest x 2, treated years ago by Dr Jarold Motto - Clear. Observe for recurrence.  - Call clinic for new or changing lesions.   - Recommend regular skin exams, daily broad-spectrum spf 30+ sunscreen use, and photoprotection.      INFLAMED SEBORRHEIC KERATOSIS (40) back x >40 (40) Symptomatic, irritating, patient would like treated. Destruction of lesion - back x >40 (40) Complexity: simple   Destruction method: cryotherapy   Informed consent: discussed and consent obtained   Timeout:  patient name, date of birth, surgical site, and procedure verified Lesion destroyed using liquid nitrogen: Yes   Region frozen until ice ball extended beyond lesion: Yes   Outcome: patient tolerated procedure well with no complications   Post-procedure details: wound care instructions given   Return in about 1 year (around 04/27/2024) for TBSE.  Wendee Beavers, CMA, am acting as scribe for Armida Sans, MD .   Documentation: I have reviewed the above documentation for accuracy and completeness, and I agree with the above.  Armida Sans, MD

## 2023-05-08 ENCOUNTER — Encounter: Payer: Self-pay | Admitting: Dermatology

## 2023-05-12 ENCOUNTER — Ambulatory Visit: Payer: Medicare Other | Admitting: Nurse Practitioner

## 2023-05-13 NOTE — Progress Notes (Signed)
   05/13/2023  Patient ID: Ryan [name], male   DOB: May 18, 1950, 73 y.o.   [name]: [id number]  Patient messaged to inform me that he brought LIS denial letter to [facility] for copy to be made-needed for Entresto  PAP.  Contacted office to request this be scanned under patient's media tab and/or emailed to me, so I can submit to [employer].  Farxiga  2025 re-enrollment was denied based on [facility]- income came back >$61,320; company will need proof of income if this is not accurate.  I will fax copy of this to [employer] on patient's behalf and follow-up with company again next week.  Januvia  2025 re-enrollment was denied b/c of patient having Medicare D, but they state based on income he will qualify.  An attestation (exception) has been mailed to patient to complete and return to program.  Ryan [name], PharmD, DPLA

## 2023-05-13 NOTE — Patient Instructions (Signed)
 Be Involved in Caring For Your Health:  Taking Medications When medications are taken as directed, they can greatly improve your health. But if they are not taken as prescribed, they may not work. In some cases, not taking them correctly can be harmful. To help ensure your treatment remains effective and safe, understand your medications and how to take them. Bring your medications to each visit for review by your provider.  Your lab results, notes, and after visit summary will be available on My Chart. We strongly encourage you to use this feature. If lab results are abnormal the clinic will contact you with the appropriate steps. If the clinic does not contact you assume the results are satisfactory. You can always view your results on My Chart. If you have questions regarding your health or results, please contact the clinic during office hours. You can also ask questions on My Chart.  We at [facility] are grateful that you chose [place] to provide your care. We strive to provide evidence-based and compassionate care and are always looking for feedback. If you get a survey from the clinic please complete this so we can hear your opinions.  Diabetes Mellitus and Foot Care Diabetes, also called diabetes mellitus, may cause problems with your feet and legs because of poor blood flow (circulation). Poor circulation may make your skin: Become thinner and drier. Break more easily. Heal more slowly. Peel and crack. You may also have nerve damage (neuropathy). This can cause decreased feeling in your legs and feet. This means that you may not notice minor injuries to your feet that could lead to more serious problems. Finding and treating problems early is the best way to prevent future foot problems. How to care for your feet Foot hygiene  Wash your feet daily with warm water and mild soap. Do not use hot water. Then, pat your feet and the areas between your toes until they are fully dry. Do  not soak your feet. This can dry your skin. Trim your toenails straight across. Do not dig under them or around the cuticle. File the edges of your nails with an emery board or nail file. Apply a moisturizing lotion or petroleum jelly to the skin on your feet and to dry, brittle toenails. Use lotion that does not contain alcohol and is unscented. Do not apply lotion between your toes. Shoes and socks Wear clean socks or stockings every day. Make sure they are not too tight. Do not wear knee-high stockings. These may decrease blood flow to your legs. Wear shoes that fit well and have enough cushioning. Always look in your shoes before you put them on to be sure there are no objects inside. To break in new shoes, wear them for just a few hours a day. This prevents injuries on your feet. Wounds, scrapes, corns, and calluses  Check your feet daily for blisters, cuts, bruises, sores, and redness. If you cannot see the bottom of your feet, use a mirror or ask someone for help. Do not cut off corns or calluses or try to remove them with medicine. If you find a minor scrape, cut, or break in the skin on your feet, keep it and the skin around it clean and dry. You may clean these areas with mild soap and water. Do not clean the area with peroxide, alcohol, or iodine. If you have a wound, scrape, corn, or callus on your foot, look at it several times a day to make sure it  is healing and not infected. Check for: Redness, swelling, or pain. Fluid or blood. Warmth. Pus or a bad smell. General tips Do not cross your legs. This may decrease blood flow to your feet. Do not use heating pads or hot water bottles on your feet. They may burn your skin. If you have lost feeling in your feet or legs, you may not know this is happening until it is too late. Protect your feet from hot and cold by wearing shoes, such as at the beach or on hot pavement. Schedule a complete foot exam at least once a year or more often if  you have foot problems. Report any cuts, sores, or bruises to your health care provider right away. Where to find more information American Diabetes Association: diabetes.org Association of Diabetes Care & Education Specialists: diabeteseducator.org Contact a health care provider if: You have a condition that increases your risk of infection, and you have any cuts, sores, or bruises on your feet. You have an injury that is not healing. You have redness on your legs or feet. You feel burning or tingling in your legs or feet. You have pain or cramps in your legs and feet. Your legs or feet are numb. Your feet always feel cold. You have pain around any toenails. Get help right away if: You have a wound, scrape, corn, or callus on your foot and: You have signs of infection. You have a fever. You have a red line going up your leg. This information is not intended to replace advice given to you by your health care provider. Make sure you discuss any questions you have with your health care provider. Document Revised: 10/28/2021 Document Reviewed: 10/28/2021 Elsevier Patient Education  2024 [employer].

## 2023-05-18 ENCOUNTER — Ambulatory Visit (INDEPENDENT_AMBULATORY_CARE_PROVIDER_SITE_OTHER): Payer: Medicare HMO | Admitting: Nurse Practitioner

## 2023-05-18 ENCOUNTER — Encounter: Payer: Self-pay | Admitting: Nurse Practitioner

## 2023-05-18 VITALS — BP 137/74 | HR 79 | Temp 97.8°F | Ht 72.0 in | Wt 201.4 lb

## 2023-05-18 DIAGNOSIS — Z6826 Body mass index (BMI) 26.0-26.9, adult: Secondary | ICD-10-CM

## 2023-05-18 DIAGNOSIS — I429 Cardiomyopathy, unspecified: Secondary | ICD-10-CM

## 2023-05-18 DIAGNOSIS — G720 Drug-induced myopathy: Secondary | ICD-10-CM | POA: Diagnosis not present

## 2023-05-18 DIAGNOSIS — I5022 Chronic systolic (congestive) heart failure: Secondary | ICD-10-CM | POA: Diagnosis not present

## 2023-05-18 DIAGNOSIS — E1122 Type 2 diabetes mellitus with diabetic chronic kidney disease: Secondary | ICD-10-CM | POA: Diagnosis not present

## 2023-05-18 DIAGNOSIS — E785 Hyperlipidemia, unspecified: Secondary | ICD-10-CM

## 2023-05-18 DIAGNOSIS — Z95810 Presence of automatic (implantable) cardiac defibrillator: Secondary | ICD-10-CM | POA: Diagnosis not present

## 2023-05-18 DIAGNOSIS — E039 Hypothyroidism, unspecified: Secondary | ICD-10-CM

## 2023-05-18 DIAGNOSIS — N183 Chronic kidney disease, stage 3 unspecified: Secondary | ICD-10-CM

## 2023-05-18 DIAGNOSIS — E1159 Type 2 diabetes mellitus with other circulatory complications: Secondary | ICD-10-CM | POA: Diagnosis not present

## 2023-05-18 DIAGNOSIS — E1169 Type 2 diabetes mellitus with other specified complication: Secondary | ICD-10-CM | POA: Diagnosis not present

## 2023-05-18 DIAGNOSIS — I152 Hypertension secondary to endocrine disorders: Secondary | ICD-10-CM

## 2023-05-18 LAB — MICROALBUMIN, URINE WAIVED
Creatinine, Urine Waived: 50 mg/dL (ref 10–300)
Microalb, Ur Waived: 30 mg/L — ABNORMAL HIGH (ref 0–19)

## 2023-05-18 LAB — BAYER DCA HB A1C WAIVED: HB A1C (BAYER DCA - WAIVED): 7.4 % — ABNORMAL HIGH (ref 4.8–5.6)

## 2023-05-18 NOTE — Assessment & Plan Note (Signed)
 BMI 27.31, slight trend up over holidays.  Recommended eating smaller high protein, low fat meals more frequently and exercising 30 mins a day 5 times a week with a goal of 10-15lb weight loss in the next 3 months. Patient voiced their understanding and motivation to adhere to these recommendations.

## 2023-05-18 NOTE — Assessment & Plan Note (Signed)
Continue collaboration with cardiology and regular checks. 

## 2023-05-18 NOTE — Assessment & Plan Note (Signed)
Chronic, ongoing.  Continue current medication regimen and adjust as needed based on labs.  TSH + Free T4 up to date and normal ranges.

## 2023-05-18 NOTE — Assessment & Plan Note (Signed)
Due to statin therapy, history of on low doses.  Would benefit from statin use with diabetes and heart health or consider injectable, discussed with him today.  

## 2023-05-18 NOTE — Assessment & Plan Note (Signed)
Chronic, ongoing.  No current medication due to poor tolerance.   Would benefit from statin use with diabetes and heart health or consider injectable, discussed with him at visits.  Lipid panel today.  He is agreeable to trial of Rosuvastatin on 3 day or 1 day a week schedule if LDL >70 in future if needed.

## 2023-05-18 NOTE — Assessment & Plan Note (Signed)
Chronic, stable.  Euvolemic today.  Continue current medication regimen and collaboration with cardiology.  LABS: CMP.   Recommend: - Reminded to call for an overnight weight gain of >2 pounds or a weekly weight weight of >5 pounds - not adding salt to his food and has been reading food labels. Reviewed the importance of keeping daily sodium intake to 2000mg  daily  - Avoid NSAIDS

## 2023-05-18 NOTE — Assessment & Plan Note (Signed)
 Chronic, stable.  BP at goal in office today.  Continue current medication regimen and collaboration with cardiology.  Recommend checking BP at home at least a few mornings a week + focus on DASH diet.  LABS: CMP.  Urine ALB 09 June 2023.  Return in 3 months.

## 2023-05-18 NOTE — Assessment & Plan Note (Signed)
Ongoing, stable, followed by cardiology.  Continue this collaboration. 

## 2023-05-18 NOTE — Assessment & Plan Note (Signed)
 Chronic, ongoing with HF.  A1c 7.4% today, slight trend up over holidays due to diet, urine ALB 09 June 2023.  Rybelsus  & Metformin  caused GI issues.   - Continue current medication regimen with Farxiga  and Januvia  100 MG + continue collaboration with cardiology.   - Could consider Sulfonylurea at low dose if needed in future or a trial of injectable like Trulicity -- although concern for GI issues.  At this time he wishes not to add or change medications. - Recommend he check BS at home at least a few mornings a week with goal fasting <130, bring to next visit.   - Continue to collaborate with CCM crew.   - Up to date on eye and foot exam - Taking ARB, no statin (per cardiology this was stopped in past) - Refuses vaccinations.

## 2023-05-18 NOTE — Assessment & Plan Note (Signed)
 Ongoing, 3a, however remains stable on labs over past years.  Recheck labs today. Urine ALB 09 June 2023 on check.  Consider referral to nephrology if worsening.

## 2023-05-18 NOTE — Progress Notes (Signed)
 BP 137/74   Pulse 79   Temp 97.8 F (36.6 C) (Oral)   Ht 6' (1.829 m)   Wt 201 lb 6.4 oz (91.4 kg)   SpO2 98%   BMI 27.31 kg/m    Subjective:    Patient ID: Ryan [name], male    DOB: 1951-04-28, 73 y.o.   [name]: [id number]  HPI: [name] is a 73 y.o. male  Chief Complaint  Patient presents with   Diabetes   Hyperlipidemia   Hypertension   Hypothyroidism   DIABETES October A1c 7.1%.  Continues Farxiga  (via assistance) + Januvia  100 MG daily (via assistance).  Had intolerance to Metformin  and Rybelsus . Prefers no injectables. Continues to work on diet, has not been walking as much due to cold weather.  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  History at present: His water has been condemned due to have high levels of PFOS, drinking bottled water and working with city and state. Put in charcoal reverse system and salt system -- last week the state did declare his water okay to drink. Supposed to have PFOS level of 4 per state and [place] has PFOS level of 8. He has well water and level was 156.  His blood was sent off to [place]  for this and no results yet.  Reports PFOS can increase calcium on blood work. Hypoglycemic episodes:no Polydipsia/polyuria: no Visual disturbance: no Chest pain: no Paresthesias: no Glucose Monitoring: no             Accucheck frequency: not checking             Fasting glucose:              Post prandial:             Evening:             Before meals: Taking Insulin?: no             Long acting insulin:             Short acting insulin: Blood Pressure Monitoring: daily Retinal Examination: Up to Date -- [place] Foot Exam: Up to Date Pneumovax: refuses Influenza:  refuses Aspirin: no    HYPERTENSION / HYPERLIPIDEMIA/HF Continues Aldactone , Entresto  (gets via assistance), Coreg .  Pravastatin in past with poor response -- myalgia.    No statin, cardiology took him off of this years ago. He is  due for visit with them, but reports has not received letter. Has cardio-defib implantable (replacement last in January 2012).  Has 1 year and 1 month on battery left. Follows with cardiology, Dr. Fernande, last visit 02/14/23.  They have discussed triglycerides and cholesterol at visits, he recommended continue current diet focus.  Satisfied with current treatment? yes Duration of hypertension: chronic BP monitoring frequency: occasionally BP range: <130/80 range on average BP medication side effects: no Duration of hyperlipidemia: chronic Medication compliance: good compliance Aspirin: no Recent stressors: no Recurrent headaches: no Visual changes: no Palpitations: no Dyspnea: no Chest pain: no Lower extremity edema: no Dizzy/lightheaded: no  The 10-year ASCVD risk score (Arnett DK, et al., 2019) is: 46.1%   Values used to calculate the score:     Age: 23 years     Sex: Male     Is Non-Hispanic African American: No     Diabetic: Yes     Tobacco smoker: No     Systolic Blood Pressure: 137 mmHg  Is BP treated: Yes     HDL Cholesterol: 33 mg/dL     Total Cholesterol: 151 mg/dL  CHRONIC KIDNEY DISEASE (CKD 3a) Stable CKD 3a since 2022. CKD status: stable Medications renally dose: yes Previous renal evaluation: no Pneumovax:  refuses Influenza Vaccine:  refuses   HYPOTHYROIDISM Taking Levothyroxine  50 MCG daily.   Thyroid  control status:stable Satisfied with current treatment? yes Medication side effects: no Medication compliance: good compliance Etiology of hypothyroidism: unknown Recent dose adjustment:no Fatigue: no Cold intolerance: no Heat intolerance: no Weight gain: no Weight loss: no Constipation: no Diarrhea/loose stools: no Palpitations: no Lower extremity edema: no Anxiety/depressed mood: no   Relevant past medical, surgical, family and social history reviewed and updated as indicated. Interim medical history since our last visit reviewed. Allergies and  medications reviewed and updated.  Review of Systems  Constitutional:  Negative for activity change, diaphoresis, fatigue and fever.  Respiratory:  Negative for cough, chest tightness, shortness of breath and wheezing.   Cardiovascular:  Negative for chest pain, palpitations and leg swelling.  Gastrointestinal: Negative.   Endocrine: Negative for polydipsia, polyphagia and polyuria.  Neurological: Negative.   Psychiatric/Behavioral: Negative.      Per HPI unless specifically indicated above     Objective:    BP 137/74   Pulse 79   Temp 97.8 F (36.6 C) (Oral)   Ht 6' (1.829 m)   Wt 201 lb 6.4 oz (91.4 kg)   SpO2 98%   BMI 27.31 kg/m   Wt Readings from Last 3 Encounters:  05/18/23 201 lb 6.4 oz (91.4 kg)  02/22/23 195 lb (88.5 kg)  02/14/23 195 lb 6.4 oz (88.6 kg)    Physical Exam Vitals and nursing note reviewed.  Constitutional:      General: He is awake. He is not in acute distress.    Appearance: He is well-developed and well-groomed. He is not ill-appearing.  HENT:     Head: Normocephalic and atraumatic.     Right Ear: Hearing normal. No drainage.     Left Ear: Hearing normal. No drainage.  Eyes:     General: Lids are normal.        Right eye: No discharge.        Left eye: No discharge.     Conjunctiva/sclera: Conjunctivae normal.     Pupils: Pupils are equal, round, and reactive to light.  Neck:     Vascular: No carotid bruit.  Cardiovascular:     Rate and Rhythm: Normal rate and regular rhythm.     Heart sounds: Normal heart sounds, S1 normal and S2 normal. No murmur heard.    No gallop.  Pulmonary:     Effort: Pulmonary effort is normal. No accessory muscle usage or respiratory distress.     Breath sounds: Normal breath sounds.  Abdominal:     General: Bowel sounds are normal.     Palpations: Abdomen is soft.  Musculoskeletal:        General: Normal range of motion.     Cervical back: Normal range of motion and neck supple.     Right lower leg: No  edema.     Left lower leg: No edema.  Skin:    General: Skin is warm and dry.     Capillary Refill: Capillary refill takes less than 2 seconds.  Neurological:     Mental Status: He is alert and oriented to person, place, and time.  Psychiatric:        Attention and Perception:  Attention normal.        Mood and Affect: Mood normal.        Speech: Speech normal.        Behavior: Behavior normal. Behavior is cooperative.        Thought Content: Thought content normal.    Outpatient Encounter Medications as of 05/18/2023  Medication Sig Note   carvedilol  (COREG ) 12.5 MG tablet Take 1 tablet (12.5 mg total) by mouth 2 (two) times daily.    dapagliflozin  propanediol (FARXIGA ) 10 MG TABS tablet Take 10 mg by mouth daily. 12/13/2022: PAP   dexlansoprazole  (DEXILANT ) 60 MG capsule Take 1 capsule (60 mg total) by mouth daily.    febuxostat  (ULORIC ) 40 MG tablet Take 1 tablet (40 mg total) by mouth daily.    JANUVIA  100 MG tablet TAKE ONE TABLET BY MOUTH EVERY DAY 05/15/2023: PAP   levothyroxine  (SYNTHROID ) 50 MCG tablet Take 1 tablet (50 mcg total) by mouth daily.    Omega-3 Fatty Acids (FISH OIL) 500 MG CAPS Take by mouth.    sacubitril -valsartan  (ENTRESTO ) 24-26 MG Take 1 tablet by mouth 2 (two) times daily. 12/13/2022: PAP   spironolactone  (ALDACTONE ) 25 MG tablet Take 1 tablet (25 mg total) by mouth daily. (Patient taking differently: Take 12.5 mg by mouth daily.)    [DISCONTINUED] Cholecalciferol (VITAMIN D3 SUPER STRENGTH) 50 MCG (2000 UT) CAPS Take 2,000 Units by mouth daily. (Patient not taking: Reported on 12/13/2022)    No facility-administered encounter medications on file as of 05/18/2023.    Results for orders placed or performed in visit on 04/27/23  CUP PACEART REMOTE DEVICE CHECK   Collection Time: 04/27/23 10:37 AM  Result Value Ref Range   Date Time Interrogation Session [id number]    Pulse Generator Manufacturer [employer]    Pulse Gen Model [id number] Claria MRI Quad CRT-D    Pulse Gen  Serial Number [id number]    Clinic Name [facility]    Implantable Pulse Generator Type Cardiac Resynch Therapy Defibulator    Implantable Pulse Generator Implant Date 79819685    Implantable Lead Manufacturer [facility]    Implantable Lead Model 1458Q Quartet    Implantable Lead Serial Number [id number]    Implantable Lead Implant Date 79819685    Implantable Lead Location Detail 1 UNKNOWN    Implantable Lead Location [id number]    Implantable Lead Connection Status [id number]    Implantable Lead Manufacturer [facility]    Implantable Lead Model 915-063-8600 Sprint Quattro Secure    Implantable Lead Serial Number [id number]    Implantable Lead Implant Date 79879888    Implantable Lead Location [id number]    Implantable Lead Connection Status [id number]    Implantable Lead Manufacturer [employer]    Implantable Lead Model 5076 CapSureFix Novus    Implantable Lead Serial Number [id number]    Implantable Lead Implant Date 79939494    Implantable Lead Location [id number]    Implantable Lead Connection Status [id number]    Lead Channel Setting Sensing Sensitivity 0.3 mV   Lead Channel Setting Pacing Amplitude 1.25 V   Lead Channel Setting Pacing Pulse Width 0.4 ms   Lead Channel Setting Pacing Amplitude 1.5 V   Lead Channel Setting Pacing Pulse Width 0.4 ms   Lead Channel Setting Pacing Amplitude 2 V   Lead Channel Setting Pacing Capture Mode Monitor Capture    Zone Setting Status Active    Zone Setting Status Inactive    Zone Setting Status Inactive    Zone Setting Status [phone]    Zone  Setting Status [phone]    Lead Channel Impedance Value 399 ohm   Lead Channel Sensing Intrinsic Amplitude 1.375 mV   Lead Channel Sensing Intrinsic Amplitude 1.375 mV   Lead Channel Pacing Threshold Amplitude 0.625 V   Lead Channel Pacing Threshold Pulse Width 0.4 ms   Lead Channel Impedance Value 589 ohm   Lead Channel Impedance Value 513 ohm   Lead Channel Pacing Threshold Amplitude 0.625 V   Lead Channel Pacing Threshold Pulse Width 0.4 ms    HighPow Impedance 50 ohm   HighPow Impedance 64 ohm   Lead Channel Impedance Value 836 ohm   Lead Channel Impedance Value 931 ohm   Lead Channel Impedance Value 950 ohm   Lead Channel Impedance Value 665 ohm   Lead Channel Impedance Value 779 ohm   Lead Channel Impedance Value 817 ohm   Lead Channel Impedance Value 551 ohm   Lead Channel Impedance Value 418 ohm   Lead Channel Impedance Value 418 ohm   Lead Channel Impedance Value 475 ohm   Lead Channel Impedance Value 237.686    Lead Channel Impedance Value 237.686    Lead Channel Impedance Value 255.093    Lead Channel Impedance Value 222.34 ohm   Lead Channel Impedance Value 222.34 ohm   Lead Channel Pacing Threshold Amplitude 1.25 V   Lead Channel Pacing Threshold Pulse Width 0.4 ms   Battery Status OK    Battery Remaining Longevity 13 mo   Battery Voltage 2.89 V   Brady Statistic RA Percent Paced 3.13 %   Brady Statistic RV Percent Paced 99.98 %   Brady Statistic AP VP Percent 3.13 %   Brady Statistic AS VP Percent 96.86 %   Brady Statistic AP VS Percent 0.01 %   Brady Statistic AS VS Percent 0 %      Assessment & Plan:   Problem List Items Addressed This Visit       Cardiovascular and Mediastinum   Hypertension associated with diabetes (HCC)   Chronic, stable.  BP at goal in office today.  Continue current medication regimen and collaboration with cardiology.  Recommend checking BP at home at least a few mornings a week + focus on DASH diet.  LABS: CMP.  Urine ALB 09 June 2023.  Return in 3 months.      Relevant Orders   Bayer DCA Hb A1c Waived   Microalbumin, Urine Waived   Comprehensive metabolic panel   Idiopathic cardiomyopathy (HCC)   Ongoing, stable, followed by cardiology.  Continue this collaboration.      Systolic CHF (HCC)   Chronic, stable.  Euvolemic today.  Continue current medication regimen and collaboration with cardiology.  LABS: CMP.   Recommend: - Reminded to call for an overnight weight  gain of >2 pounds or a weekly weight weight of >5 pounds - not adding salt to his food and has been reading food labels. Reviewed the importance of keeping daily sodium intake to 2000mg  daily  - Avoid NSAIDS      Type 2 diabetes mellitus with cardiac complication (HCC) - Primary   Chronic, ongoing with HF.  A1c 7.4% today, slight trend up over holidays due to diet, urine ALB 09 June 2023.  Rybelsus  & Metformin  caused GI issues.   - Continue current medication regimen with Farxiga  and Januvia  100 MG + continue collaboration with cardiology.   - Could consider Sulfonylurea at low dose if needed in future or a trial of injectable like Trulicity -- although concern for GI  issues.  At this time he wishes not to add or change medications. - Recommend he check BS at home at least a few mornings a week with goal fasting <130, bring to next visit.   - Continue to collaborate with CCM crew.   - Up to date on eye and foot exam - Taking ARB, no statin (per cardiology this was stopped in past) - Refuses vaccinations.      Relevant Orders   Bayer DCA Hb A1c Waived     Endocrine   CKD stage 3 due to type 2 diabetes mellitus (HCC)   Ongoing, 3a, however remains stable on labs over past years.  Recheck labs today. Urine ALB 09 June 2023 on check.  Consider referral to nephrology if worsening.      Relevant Orders   Bayer DCA Hb A1c Waived   Microalbumin, Urine Waived   Comprehensive metabolic panel   Hyperlipidemia associated with type 2 diabetes mellitus (HCC)   Chronic, ongoing.  No current medication due to poor tolerance.   Would benefit from statin use with diabetes and heart health or consider injectable, discussed with him at visits.  Lipid panel today.  He is agreeable to trial of Rosuvastatin on 3 day or 1 day a week schedule if LDL >70 in future if needed.      Relevant Orders   Bayer DCA Hb A1c Waived   Comprehensive metabolic panel   Lipid Panel w/o Chol/HDL Ratio    Hypothyroidism   Chronic, ongoing.  Continue current medication regimen and adjust as needed based on labs.  TSH + Free T4 up to date and normal ranges.        Musculoskeletal and Integument   Drug-induced myopathy   Due to statin therapy, history of on low doses.  Would benefit from statin use with diabetes and heart health or consider injectable, discussed with him today.         Other   Biventricular implantable cardioverter-defibrillator in situ   Continue collaboration with cardiology and regular checks.      BMI 26.0-26.9,adult   BMI 27.31, slight trend up over holidays.  Recommended eating smaller high protein, low fat meals more frequently and exercising 30 mins a day 5 times a week with a goal of 10-15lb weight loss in the next 3 months. Patient voiced their understanding and motivation to adhere to these recommendations.         Follow up plan: Return in about 3 months (around 08/16/2023) for Annual Physical and diabetes check.

## 2023-05-19 LAB — COMPREHENSIVE METABOLIC PANEL
ALT: 31 [IU]/L (ref 0–44)
AST: 15 [IU]/L (ref 0–40)
Albumin: 4.7 g/dL (ref 3.8–4.8)
Alkaline Phosphatase: 46 [IU]/L (ref 44–121)
BUN/Creatinine Ratio: 16 (ref 10–24)
BUN: 24 mg/dL (ref 8–27)
Bilirubin Total: 0.4 mg/dL (ref 0.0–1.2)
CO2: 20 mmol/L (ref 20–29)
Calcium: 9.5 mg/dL (ref 8.6–10.2)
Chloride: 105 mmol/L (ref 96–106)
Creatinine, Ser: 1.47 mg/dL — ABNORMAL HIGH (ref 0.76–1.27)
Globulin, Total: 2.1 g/dL (ref 1.5–4.5)
Glucose: 223 mg/dL — ABNORMAL HIGH (ref 70–99)
Potassium: 4.5 mmol/L (ref 3.5–5.2)
Sodium: 140 mmol/L (ref 134–144)
Total Protein: 6.8 g/dL (ref 6.0–8.5)
eGFR: 50 mL/min/{1.73_m2} — ABNORMAL LOW (ref >59)

## 2023-05-19 LAB — LIPID PANEL W/O CHOL/HDL RATIO
Cholesterol, Total: 151 mg/dL (ref 100–199)
HDL: 29 mg/dL — ABNORMAL LOW (ref >39)
LDL Chol Calc (NIH): 73 mg/dL (ref 0–99)
Triglycerides: 303 mg/dL — ABNORMAL HIGH (ref 0–149)
VLDL Cholesterol Cal: 49 mg/dL — ABNORMAL HIGH (ref 5–40)

## 2023-05-19 NOTE — Progress Notes (Signed)
 Contacted via MyChart   Good morning Ryan [name], your labs have returned and overall remain stable.  Kidney function continues to show stable chronic kidney disease stage 3a.  Cholesterol levels continue to show elevations in triglycerides, you would benefit from low dose statin or even Zetia  as we have discussed.  Something to think about for future.  Any questions? Keep being amazing!!  Thank you for allowing me to participate in your care.  I appreciate you. Kindest regards, Dolores Mcgovern

## 2023-05-22 ENCOUNTER — Encounter: Payer: Self-pay | Admitting: Nurse Practitioner

## 2023-05-23 ENCOUNTER — Other Ambulatory Visit: Payer: Self-pay | Admitting: Nurse Practitioner

## 2023-05-23 MED ORDER — EZETIMIBE 10 MG PO TABS: 10.0000 mg | ORAL_TABLET | Freq: Every day | ORAL | 3 refills | Status: DC

## 2023-05-25 ENCOUNTER — Telehealth: Payer: Self-pay

## 2023-05-25 MED ORDER — ENTRESTO 24-26 MG PO TABS: 1.0000 | ORAL_TABLET | Freq: Two times a day (BID) | ORAL | 0 refills | Status: DC

## 2023-05-25 NOTE — Progress Notes (Signed)
   05/25/2023  Patient ID: Ryan [name], male   DOB: 07-02-1950, 73 y.o.   [name]: [id number]  Received request from patient asking for a phone call from me. Called the patient and he states that he is down to a 7 day supply of his Entresto  and would like a 30 day refill sent to [employer] while we continue to work on the Entresto  patient assistance program. Patient received another copy of the Medicare extra help denial letter in the mail and will email this to me, so I can submit that to the Entresto  patient assistance program. Patient also states that he has not yet heard anything in regard to his Farxiga  patient assistance for 2025, but he did receive a 3 month refill of this medication just last week. To note, the refill was filled in December; so it would have been under last years patient assistance. I will contact [employer] to follow up on his 2025 enrollment status. Patient also states he never received anything from [employer] regarding the attestation for the Januvia  he was receiving through that patient assistance program. I will call that program to follow up as well and will make patient aware of any information I obtain.  Linn Rich, PharmD, DPLA

## 2023-05-25 NOTE — Progress Notes (Signed)
 LIS denial letter rec'd from patient and faxed to [employer] PAP for Entresto .  Linn Rich, PharmD, DPLA

## 2023-05-27 ENCOUNTER — Other Ambulatory Visit: Payer: Self-pay

## 2023-05-27 MED ORDER — FARXIGA 10 MG PO TABS: ORAL_TABLET | ORAL | 3 refills | Status: DC

## 2023-05-27 NOTE — Progress Notes (Signed)
   05/27/2023  Patient ID: Ryan [name], male   DOB: [birth year], 73 y.o.   [name]: [id number]  Received a message from Mr. Reish earlier today stating he had received 3 bottles of Entresto from the patient assistance program, so he is approved for 2025.  Contacted [employer] to check on 2025 reenrollment for Farxiga 10 mg; and patient has been approved for this through 2025 as well.  Patient assistance program does state they need a new prescription e-prescribed for Farxiga.  I will pend a prescription for his primary care provider to sign if in agreement.  Contacted [employer] patient assistance regarding patient's 2025 reenrollment for Januvia, and they have  mailed the patient another attestation to be able to get approved for the program.  They had originally mailed one several weeks ago, but the patient had never received it.  Lenna Gilford, PharmD, DPLA

## 2023-06-06 NOTE — Progress Notes (Signed)
Remote ICD transmission.

## 2023-06-07 ENCOUNTER — Other Ambulatory Visit: Payer: Self-pay

## 2023-06-07 MED ORDER — SITAGLIPTIN PHOSPHATE 100 MG PO TABS: 100.0000 mg | ORAL_TABLET | Freq: Every day | ORAL | 1 refills | Status: DC

## 2023-06-07 NOTE — Progress Notes (Signed)
   06/07/2023  Patient ID: Ryan [name], male   DOB: December 13, 1950, 73 y.o.   [name]: [id number]  Patient out reach to assist with additional paperwork patient received from [employer] patient assistance program regarding Januvia 100 mg.  [employer] is requiring an Patent attorney stating the reason [facility] is not affordable for the patient, including medication is not covered by insurance, co-pay is not affordable, unable to meet plan deductible.  Patient is unsure of what his co-pay will be if the medication is covered; so I am pending a prescription to go to his retail pharmacy for PCP to sign if in agreement.  If the medication is not covered or co-pay is not affordable, we will move forward with the attestation paperwork; so patient can continue to receive the receive the medication through the patient assistance program.  Lenna Gilford, PharmD, DPLA

## 2023-06-07 NOTE — Progress Notes (Signed)
   06/07/2023  Patient ID: Ryan [name], male   DOB: [birth year], 73 y.o.   [name]: [id number]  Patient contacted [facility], and 1 month co-pay for Januvia 100 mg is going to be $81 a month.  This is not affordable for the patient long-term, so he is completing the attestation meal by [facility] in order to continue to receive the medication through the patient assistance program patient will notify me once he hears back from the program.  Lenna Gilford, PharmD, DPLA

## 2023-06-23 ENCOUNTER — Telehealth: Payer: Self-pay

## 2023-06-23 NOTE — Progress Notes (Unsigned)
Pharmacy Medication Assistance Program Note    06/23/2023  Patient ID: Ryan [name], male   DOB: 1951-03-28, 73 y.o.   [name]: [id number]     06/23/2023  Outreach Medication One  Type of Assistance Manufacturer Assistance  Name of Prescriber Aura Dials  Patient Assistance Determination Approved  Approval Start Date 06/13/2023  Approval End Date 06/13/2023  Patient Notification Method MyChart     Signature

## 2023-06-23 NOTE — Telephone Encounter (Signed)
PAP: Patient assistance application for JANUVIA has been approved by PAP Companies: [employer] from 06/13/2023 to 05/09/2024. Medication should be delivered to PAP Delivery: Home. For further shipping updates, please contact [employer] at [phone]. Patient ID is: NO ID   PLEASE BE ADVISED BEING PROCESSED BY PHARMACY AT [facility] AND PROCESS AND SHIP TO HOME ON 06/24/2023 SHOULD BE TO HOME 3-7 BUSINESS DAYS

## 2023-06-30 NOTE — Progress Notes (Signed)
   06/30/2023  Patient ID: Ryan [name], male   DOB: Sep 29, 1950, 73 y.o.   [name]: [id number]  Received an email and voicemail from patient stating insurance is no longer wanting to cover Febuxostat 40 mg daily.  I was able to contact the insurance company and get a step therapy override based on patient's previous allopurinol use.  Medication will continue to be covered per insurance representative.  Lenna Gilford, PharmD, DPLA

## 2023-07-14 ENCOUNTER — Ambulatory Visit: Payer: Self-pay | Admitting: *Deleted

## 2023-07-14 NOTE — Patient Outreach (Signed)
 Care Coordination   Follow Up Visit Note   07/14/2023 Name: Ryan [name] [name]: [id number] DOB: 1950-06-06  Ryan [name] is a 73 y.o. year old male who sees [place], Corrie Dandy T, NP for primary care. I spoke with  Ryan [name] by phone today.  What matters to the patients health and wellness today?  Patient report he is doing well.  Has worked closely with pharmacist for medication assistance, expresses gratitude for help and resources.  Denies any urgent concerns, encouraged to contact this care manager with questions.     Goals Addressed             This Visit's Progress    COMPLETED: Care Coordination activities       Interventions Today    Flowsheet Row Most Recent Value  Chronic Disease   Chronic disease during today's visit Hypertension (HTN), Diabetes, Congestive Heart Failure (CHF)  General Interventions   General Interventions Discussed/Reviewed General Interventions Reviewed, Doctor Visits, Labs  Labs Hgb A1c every 3 months  [A1C remains at goal]  Doctor Visits Discussed/Reviewed Doctor Visits Reviewed, PCP, Annual Wellness Visits  [Upcoming PCP 4/10 and AWV 10/21]  PCP/Specialist Visits Compliance with follow-up visit  Education Interventions   Education Provided Provided Education  Provided Verbal Education On Medication, When to see the doctor, Blood Sugar Monitoring  [Meds reviewed, happy to have patient assistance confirmed. Reminded to monitor blood sugar, BP, and weights daily and report abnormal readings to MD]              SDOH assessments and interventions completed:  No     Care Coordination Interventions:  Yes, provided   Follow up plan: No further intervention required.   Encounter Outcome:  Patient Visit Completed   Rodney Langton, RN, MSN, CCM [place]  [facility], [facility] RN Care Coordinator Direct Dial: [phone] / Main [phone] Fax [phone] Email: [email or web address] Website:  [email or web address]

## 2023-07-27 ENCOUNTER — Ambulatory Visit: Payer: Medicare Other | Attending: Internal Medicine

## 2023-07-27 DIAGNOSIS — I428 Other cardiomyopathies: Secondary | ICD-10-CM | POA: Diagnosis not present

## 2023-07-28 LAB — CUP PACEART REMOTE DEVICE CHECK
Battery Remaining Longevity: 12 mo
Battery Voltage: 2.88 V
Brady Statistic AP VP Percent: 3.06 %
Brady Statistic AP VS Percent: 0.01 %
Brady Statistic AS VP Percent: 96.93 %
Brady Statistic AS VS Percent: 0 %
Brady Statistic RA Percent Paced: 3.06 %
Brady Statistic RV Percent Paced: 99.98 %
Date Time Interrogation Session: 20250319012304
HighPow Impedance: 54 Ohm
HighPow Impedance: 75 Ohm
Implantable Lead Connection Status: 753985
Implantable Lead Connection Status: 753985
Implantable Lead Connection Status: 753985
Implantable Lead Implant Date: 20060505
Implantable Lead Implant Date: 20120111
Implantable Lead Implant Date: 20180314
Implantable Lead Location: 753858
Implantable Lead Location: 753859
Implantable Lead Location: 753860
Implantable Lead Model: 5076
Implantable Lead Model: 6947
Implantable Pulse Generator Implant Date: 20180314
Lead Channel Impedance Value: 230.327
Lead Channel Impedance Value: 237.686
Lead Channel Impedance Value: 246.635
Lead Channel Impedance Value: 255.093
Lead Channel Impedance Value: 265.661
Lead Channel Impedance Value: 418 Ohm
Lead Channel Impedance Value: 456 Ohm
Lead Channel Impedance Value: 475 Ohm
Lead Channel Impedance Value: 513 Ohm
Lead Channel Impedance Value: 551 Ohm
Lead Channel Impedance Value: 551 Ohm
Lead Channel Impedance Value: 646 Ohm
Lead Channel Impedance Value: 722 Ohm
Lead Channel Impedance Value: 779 Ohm
Lead Channel Impedance Value: 836 Ohm
Lead Channel Impedance Value: 836 Ohm
Lead Channel Impedance Value: 836 Ohm
Lead Channel Impedance Value: 893 Ohm
Lead Channel Pacing Threshold Amplitude: 0.625 V
Lead Channel Pacing Threshold Amplitude: 0.625 V
Lead Channel Pacing Threshold Amplitude: 1 V
Lead Channel Pacing Threshold Pulse Width: 0.4 ms
Lead Channel Pacing Threshold Pulse Width: 0.4 ms
Lead Channel Pacing Threshold Pulse Width: 0.4 ms
Lead Channel Sensing Intrinsic Amplitude: 1.125 mV
Lead Channel Sensing Intrinsic Amplitude: 1.125 mV
Lead Channel Setting Pacing Amplitude: 1.25 V
Lead Channel Setting Pacing Amplitude: 1.5 V
Lead Channel Setting Pacing Amplitude: 2 V
Lead Channel Setting Pacing Pulse Width: 0.4 ms
Lead Channel Setting Pacing Pulse Width: 0.4 ms
Lead Channel Setting Sensing Sensitivity: 0.3 mV
Zone Setting Status: 755011
Zone Setting Status: 755011

## 2023-08-07 DIAGNOSIS — E119 Type 2 diabetes mellitus without complications: Secondary | ICD-10-CM | POA: Insufficient documentation

## 2023-08-07 NOTE — Patient Instructions (Signed)
 Be Involved in Caring For Your Health:  Taking Medications When medications are taken as directed, they can greatly improve your health. But if they are not taken as prescribed, they may not work. In some cases, not taking them correctly can be harmful. To help ensure your treatment remains effective and safe, understand your medications and how to take them. Bring your medications to each visit for review by your provider.  Your lab results, notes, and after visit summary will be available on My Chart. We strongly encourage you to use this feature. If lab results are abnormal the clinic will contact you with the appropriate steps. If the clinic does not contact you assume the results are satisfactory. You can always view your results on My Chart. If you have questions regarding your health or results, please contact the clinic during office hours. You can also ask questions on My Chart.  We at [facility] are grateful that you chose [place] to provide your care. We strive to provide evidence-based and compassionate care and are always looking for feedback. If you get a survey from the clinic please complete this so we can hear your opinions.  Diabetes Mellitus and Foot Care Diabetes, also called diabetes mellitus, may cause problems with your feet and legs because of poor blood flow (circulation). Poor circulation may make your skin: Become thinner and drier. Break more easily. Heal more slowly. Peel and crack. You may also have nerve damage (neuropathy). This can cause decreased feeling in your legs and feet. This means that you may not notice minor injuries to your feet that could lead to more serious problems. Finding and treating problems early is the best way to prevent future foot problems. How to care for your feet Foot hygiene  Wash your feet daily with warm water and mild soap. Do not use hot water. Then, pat your feet and the areas between your toes until they are fully dry. Do  not soak your feet. This can dry your skin. Trim your toenails straight across. Do not dig under them or around the cuticle. File the edges of your nails with an emery board or nail file. Apply a moisturizing lotion or petroleum jelly to the skin on your feet and to dry, brittle toenails. Use lotion that does not contain alcohol and is unscented. Do not apply lotion between your toes. Shoes and socks Wear clean socks or stockings every day. Make sure they are not too tight. Do not wear knee-high stockings. These may decrease blood flow to your legs. Wear shoes that fit well and have enough cushioning. Always look in your shoes before you put them on to be sure there are no objects inside. To break in new shoes, wear them for just a few hours a day. This prevents injuries on your feet. Wounds, scrapes, corns, and calluses  Check your feet daily for blisters, cuts, bruises, sores, and redness. If you cannot see the bottom of your feet, use a mirror or ask someone for help. Do not cut off corns or calluses or try to remove them with medicine. If you find a minor scrape, cut, or break in the skin on your feet, keep it and the skin around it clean and dry. You may clean these areas with mild soap and water. Do not clean the area with peroxide, alcohol, or iodine. If you have a wound, scrape, corn, or callus on your foot, look at it several times a day to make sure it  is healing and not infected. Check for: Redness, swelling, or pain. Fluid or blood. Warmth. Pus or a bad smell. General tips Do not cross your legs. This may decrease blood flow to your feet. Do not use heating pads or hot water bottles on your feet. They may burn your skin. If you have lost feeling in your feet or legs, you may not know this is happening until it is too late. Protect your feet from hot and cold by wearing shoes, such as at the beach or on hot pavement. Schedule a complete foot exam at least once a year or more often if  you have foot problems. Report any cuts, sores, or bruises to your health care provider right away. Where to find more information American Diabetes Association: diabetes.org Association of Diabetes Care & Education Specialists: diabeteseducator.org Contact a health care provider if: You have a condition that increases your risk of infection, and you have any cuts, sores, or bruises on your feet. You have an injury that is not healing. You have redness on your legs or feet. You feel burning or tingling in your legs or feet. You have pain or cramps in your legs and feet. Your legs or feet are numb. Your feet always feel cold. You have pain around any toenails. Get help right away if: You have a wound, scrape, corn, or callus on your foot and: You have signs of infection. You have a fever. You have a red line going up your leg. This information is not intended to replace advice given to you by your health care provider. Make sure you discuss any questions you have with your health care provider. Document Revised: 10/28/2021 Document Reviewed: 10/28/2021 Elsevier Patient Education  2024 [employer].

## 2023-08-17 ENCOUNTER — Other Ambulatory Visit: Payer: Self-pay | Admitting: Nurse Practitioner

## 2023-08-18 ENCOUNTER — Ambulatory Visit (INDEPENDENT_AMBULATORY_CARE_PROVIDER_SITE_OTHER): Payer: Self-pay | Admitting: Nurse Practitioner

## 2023-08-18 ENCOUNTER — Encounter: Payer: Self-pay | Admitting: Nurse Practitioner

## 2023-08-18 VITALS — BP 136/75 | HR 65 | Temp 98.1°F | Ht 72.9 in | Wt 200.6 lb

## 2023-08-18 DIAGNOSIS — I5022 Chronic systolic (congestive) heart failure: Secondary | ICD-10-CM | POA: Diagnosis not present

## 2023-08-18 DIAGNOSIS — Z0000 Encounter for general adult medical examination without abnormal findings: Secondary | ICD-10-CM | POA: Diagnosis not present

## 2023-08-18 DIAGNOSIS — E1122 Type 2 diabetes mellitus with diabetic chronic kidney disease: Secondary | ICD-10-CM

## 2023-08-18 DIAGNOSIS — E1169 Type 2 diabetes mellitus with other specified complication: Secondary | ICD-10-CM

## 2023-08-18 DIAGNOSIS — N400 Enlarged prostate without lower urinary tract symptoms: Secondary | ICD-10-CM | POA: Diagnosis not present

## 2023-08-18 DIAGNOSIS — I429 Cardiomyopathy, unspecified: Secondary | ICD-10-CM

## 2023-08-18 DIAGNOSIS — I152 Hypertension secondary to endocrine disorders: Secondary | ICD-10-CM | POA: Diagnosis not present

## 2023-08-18 DIAGNOSIS — E785 Hyperlipidemia, unspecified: Secondary | ICD-10-CM | POA: Diagnosis not present

## 2023-08-18 DIAGNOSIS — N183 Chronic kidney disease, stage 3 unspecified: Secondary | ICD-10-CM

## 2023-08-18 DIAGNOSIS — M1A0710 Idiopathic chronic gout, right ankle and foot, without tophus (tophi): Secondary | ICD-10-CM | POA: Diagnosis not present

## 2023-08-18 DIAGNOSIS — E1159 Type 2 diabetes mellitus with other circulatory complications: Secondary | ICD-10-CM | POA: Diagnosis not present

## 2023-08-18 DIAGNOSIS — G720 Drug-induced myopathy: Secondary | ICD-10-CM | POA: Diagnosis not present

## 2023-08-18 DIAGNOSIS — E039 Hypothyroidism, unspecified: Secondary | ICD-10-CM

## 2023-08-18 DIAGNOSIS — E119 Type 2 diabetes mellitus without complications: Secondary | ICD-10-CM | POA: Diagnosis not present

## 2023-08-18 LAB — BAYER DCA HB A1C WAIVED: HB A1C (BAYER DCA - WAIVED): 7.9 % — ABNORMAL HIGH (ref 4.8–5.6)

## 2023-08-18 NOTE — Telephone Encounter (Signed)
 OV scheduled 08/18/23.  Requested Prescriptions  Pending Prescriptions Disp Refills   carvedilol (COREG) 12.5 MG tablet [Pharmacy Med Name: CARVEDILOL 12.5 MG TABLET] 180 tablet 0    Sig: Take 1 tablet (12.5 mg total) by mouth 2 (two) times daily.     Cardiovascular: Beta Blockers 3 Failed - 08/18/2023  8:37 AM      Failed - Cr in normal range and within 360 days    Creatinine, Ser  Date Value Ref Range Status  05/18/2023 1.47 (H) 0.76 - 1.27 mg/dL Final         Passed - AST in normal range and within 360 days    AST  Date Value Ref Range Status  05/18/2023 15 0 - 40 IU/L Final   AST (SGOT) Piccolo, Waived  Date Value Ref Range Status  01/10/2019 25 11 - 38 U/L Final         Passed - ALT in normal range and within 360 days    ALT  Date Value Ref Range Status  05/18/2023 31 0 - 44 IU/L Final   ALT (SGPT) Piccolo, Waived  Date Value Ref Range Status  01/10/2019 35 10 - 47 U/L Final         Passed - Last BP in normal range    BP Readings from Last 1 Encounters:  08/18/23 136/75         Passed - Last Heart Rate in normal range    Pulse Readings from Last 1 Encounters:  08/18/23 65         Passed - Valid encounter within last 6 months    Recent Outpatient Visits           Today Type 2 diabetes mellitus with cardiac complication [facility])   [facility] Marjie Skiff, NP       Future Appointments             In 7 months Deirdre Evener, MD [facility]

## 2023-08-18 NOTE — Assessment & Plan Note (Signed)
 Chronic, ongoing.  Tolerating Zetia, will continue this.   Would benefit from statin use with diabetes and heart health or consider injectable, discussed with him at visits.  Lipid panel today.

## 2023-08-18 NOTE — Assessment & Plan Note (Signed)
Ongoing, stable, followed by cardiology.  Continue this collaboration. 

## 2023-08-18 NOTE — Progress Notes (Signed)
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 Refused - Prevnar: Refused - Zostavax vaccine: Refused  SCREENING: - Colonoscopy: Refused  Discussed with patient purpose of the colonoscopy  is to detect colon cancer at curable precancerous or early stages   - AAA Screening: Not applicable  -Hearing Test: Not applicable  -Spirometry: Not applicable   PATIENT COUNSELING:    Sexuality: Discussed sexually transmitted diseases, partner selection, use of condoms, avoidance of unintended pregnancy  and contraceptive alternatives.   Advised to avoid cigarette smoking.  I discussed with the patient that most people either abstain from alcohol or drink within safe limits (<=14/week and <=4 drinks/occasion for males, <=7/weeks and <= 3 drinks/occasion for females) and that the risk for alcohol disorders and other health effects rises proportionally with the number of drinks per week and how often a drinker exceeds daily limits.  Discussed cessation/primary prevention of drug use and availability of treatment for abuse.   Diet: Encouraged to adjust caloric intake to maintain  or achieve ideal body weight, to reduce intake of dietary saturated fat and total fat, to limit  sodium intake by avoiding high sodium foods and not adding table salt, and to maintain adequate dietary potassium and calcium preferably from fresh fruits, vegetables, and low-fat dairy products.    Stressed the importance of regular exercise  Injury prevention: Discussed safety belts, safety helmets, smoke detector, smoking near bedding or upholstery.   Dental health: Discussed importance of regular tooth brushing, flossing, and dental visits.   Follow up plan: NEXT PREVENTATIVE PHYSICAL DUE IN 1 YEAR. Return in about 3 months (around 11/17/2023) for T2DM, HTN/HLD.

## 2023-08-18 NOTE — Assessment & Plan Note (Signed)
Stable, check PSA today. 

## 2023-08-18 NOTE — Assessment & Plan Note (Signed)
 Chronic, stable.  Euvolemic today.  Continue current medication regimen and collaboration with cardiology.  LABS: CBC, CMP.   Recommend: - Reminded to call for an overnight weight gain of >2 pounds or a weekly weight weight of >5 pounds - not adding salt to his food and has been reading food labels. Reviewed the importance of keeping daily sodium intake to 2000mg  daily  - Avoid NSAIDS

## 2023-08-18 NOTE — Assessment & Plan Note (Signed)
Refer to diabetes with cardiac complication plan of care. 

## 2023-08-18 NOTE — Assessment & Plan Note (Signed)
 Chronic, stable.  BP at goal in office today.  Continue current medication regimen and collaboration with cardiology.  Recommend checking BP at home at least a few mornings a week + focus on DASH diet.  LABS: CBC, TSH, CMP.  Urine ALB 09 June 2023.  Return in 3 months.

## 2023-08-18 NOTE — Assessment & Plan Note (Signed)
 Chronic, ongoing with HF.  A1c 7.9% today, slight trend up, urine ALB 09 June 2023.  Rybelsus & Metformin caused GI issues.  We discussed and could consider trial of GLP1 injectable next visit if ongoing elevations, which he would be willing to try.  If poor tolerance then consider Glipizide.  For now no changes. - Continue current medication regimen with Farxiga and Januvia 100 MG + continue collaboration with cardiology.   - Recommend he check BS at home at least a few mornings a week with goal fasting <130, bring to next visit.   - Continue to collaborate with PharmD.  - Up to date on eye and foot exam - Taking ARB, no statin (per cardiology this was stopped in past) - takes Zetia. - Refuses vaccinations.

## 2023-08-18 NOTE — Assessment & Plan Note (Signed)
Chronic, stable.  Continue Uloric and recheck uric acid today. 

## 2023-08-18 NOTE — Assessment & Plan Note (Signed)
 Chronic, ongoing CKD 3a.  A1c 7.9% today, slight trend up, urine ALB 09 June 2023.  Rybelsus & Metformin caused GI issues.  We discussed and could consider trial of GLP1 injectable next visit if ongoing elevations, which he would be willing to try.  If poor tolerance then consider Glipizide.  For now no changes. - Continue current medication regimen with Farxiga and Januvia 100 MG + continue collaboration with cardiology.   - Recommend he check BS at home at least a few mornings a week with goal fasting <130, bring to next visit.   - Continue to collaborate with PharmD.  - Up to date on eye and foot exam - Taking ARB, no statin (per cardiology this was stopped in past) - takes Zetia. - Refuses vaccinations.

## 2023-08-18 NOTE — Assessment & Plan Note (Signed)
Chronic, ongoing.  Continue current medication regimen and adjust as needed based on labs.  TSH + Free T4 today. ?

## 2023-08-18 NOTE — Assessment & Plan Note (Signed)
 Due to statin therapy, history of on low doses.  Would benefit from statin use with diabetes and heart health or consider injectable, discussed with him today.  Continue Zetia as is tolerating.

## 2023-08-19 ENCOUNTER — Encounter: Payer: Self-pay | Admitting: Nurse Practitioner

## 2023-08-19 LAB — COMPREHENSIVE METABOLIC PANEL WITH GFR
ALT: 35 IU/L (ref 0–44)
AST: 21 IU/L (ref 0–40)
Albumin: 4.7 g/dL (ref 3.8–4.8)
Alkaline Phosphatase: 47 IU/L (ref 44–121)
BUN/Creatinine Ratio: 13 (ref 10–24)
BUN: 19 mg/dL (ref 8–27)
Bilirubin Total: 0.6 mg/dL (ref 0.0–1.2)
CO2: 19 mmol/L — ABNORMAL LOW (ref 20–29)
Calcium: 9.4 mg/dL (ref 8.6–10.2)
Chloride: 103 mmol/L (ref 96–106)
Creatinine, Ser: 1.41 mg/dL — ABNORMAL HIGH (ref 0.76–1.27)
Globulin, Total: 2.2 g/dL (ref 1.5–4.5)
Glucose: 209 mg/dL — ABNORMAL HIGH (ref 70–99)
Potassium: 4.5 mmol/L (ref 3.5–5.2)
Sodium: 137 mmol/L (ref 134–144)
Total Protein: 6.9 g/dL (ref 6.0–8.5)
eGFR: 53 mL/min/{1.73_m2} — ABNORMAL LOW (ref >59)

## 2023-08-19 LAB — URIC ACID: Uric Acid: 3.2 mg/dL — ABNORMAL LOW (ref 3.8–8.4)

## 2023-08-19 LAB — CBC WITH DIFFERENTIAL/PLATELET
Basophils Absolute: 0.1 10*3/uL (ref 0.0–0.2)
Basos: 1 %
EOS (ABSOLUTE): 0.3 10*3/uL (ref 0.0–0.4)
Eos: 5 %
Hematocrit: 45.8 % (ref 37.5–51.0)
Hemoglobin: 15.7 g/dL (ref 13.0–17.7)
Immature Grans (Abs): 0 10*3/uL (ref 0.0–0.1)
Immature Granulocytes: 0 %
Lymphocytes Absolute: 1.8 10*3/uL (ref 0.7–3.1)
Lymphs: 26 %
MCH: 31.2 pg (ref 26.6–33.0)
MCHC: 34.3 g/dL (ref 31.5–35.7)
MCV: 91 fL (ref 79–97)
Monocytes Absolute: 0.5 10*3/uL (ref 0.1–0.9)
Monocytes: 8 %
Neutrophils Absolute: 4 10*3/uL (ref 1.4–7.0)
Neutrophils: 60 %
Platelets: 164 10*3/uL (ref 150–450)
RBC: 5.04 x10E6/uL (ref 4.14–5.80)
RDW: 12.7 % (ref 11.6–15.4)
WBC: 6.8 10*3/uL (ref 3.4–10.8)

## 2023-08-19 LAB — LIPID PANEL W/O CHOL/HDL RATIO
Cholesterol, Total: 126 mg/dL (ref 100–199)
HDL: 34 mg/dL — ABNORMAL LOW (ref >39)
LDL Chol Calc (NIH): 57 mg/dL (ref 0–99)
Triglycerides: 215 mg/dL — ABNORMAL HIGH (ref 0–149)
VLDL Cholesterol Cal: 35 mg/dL (ref 5–40)

## 2023-08-19 LAB — TSH: TSH: 4.3 u[IU]/mL (ref 0.450–4.500)

## 2023-08-19 LAB — PSA: Prostate Specific Ag, Serum: 1.4 ng/mL (ref 0.0–4.0)

## 2023-08-19 LAB — T4, FREE: Free T4: 1.3 ng/dL (ref 0.82–1.77)

## 2023-08-19 NOTE — Progress Notes (Signed)
 Contacted via MyChart   Good afternoon Ryan [name], your labs have returned and overall remain stable.  Kidney function,  creatinine and eGFR, continues to show Stage 3a kidney disease but no worsening.  Glucose level is in 200's, definitely focus on diet and regular activity.  Any questions?  Continue all current medications:) Keep being stellar!!  Thank you for allowing me to participate in your care.  I appreciate you. Kindest regards, Adra Shepler

## 2023-08-20 ENCOUNTER — Other Ambulatory Visit: Payer: Self-pay | Admitting: Nurse Practitioner

## 2023-08-22 ENCOUNTER — Encounter: Payer: Self-pay | Admitting: Internal Medicine

## 2023-08-22 ENCOUNTER — Other Ambulatory Visit: Payer: Self-pay | Admitting: Nurse Practitioner

## 2023-08-22 DIAGNOSIS — E039 Hypothyroidism, unspecified: Secondary | ICD-10-CM

## 2023-08-22 NOTE — Telephone Encounter (Signed)
 Requested Prescriptions  Pending Prescriptions Disp Refills   spironolactone (ALDACTONE) 25 MG tablet [Pharmacy Med Name: SPIRONOLACTONE 25 MG TABLET] 90 tablet 0    Sig: Take 1 tablet (25 mg total) by mouth daily.     Cardiovascular: Diuretics - Aldosterone Antagonist Failed - 08/22/2023  1:18 PM      Failed - Cr in normal range and within 180 days    Creatinine, Ser  Date Value Ref Range Status  08/18/2023 1.41 (H) 0.76 - 1.27 mg/dL Final         Passed - K in normal range and within 180 days    Potassium  Date Value Ref Range Status  08/18/2023 4.5 3.5 - 5.2 mmol/L Final         Passed - Na in normal range and within 180 days    Sodium  Date Value Ref Range Status  08/18/2023 137 134 - 144 mmol/L Final         Passed - eGFR is 30 or above and within 180 days    GFR calc Af Amer  Date Value Ref Range Status  06/04/2020 59 (L) >59 mL/min/1.73 Final    Comment:    **In accordance with recommendations from the NKF-ASN Task force,**   [facility] is in the process of updating its eGFR calculation to the   2021 CKD-EPI creatinine equation that estimates kidney function   without a race variable.    GFR calc non Af Amer  Date Value Ref Range Status  06/04/2020 51 (L) >59 mL/min/1.73 Final   eGFR  Date Value Ref Range Status  08/18/2023 53 (L) >59 mL/min/1.73 Final         Passed - Last BP in normal range    BP Readings from Last 1 Encounters:  08/18/23 136/75         Passed - Valid encounter within last 6 months    Recent Outpatient Visits           4 days ago Type 2 diabetes mellitus with cardiac complication [facility])    [facility] Lemar Pyles, NP       Future Appointments             In 7 months Elta Halter, MD [facility] [place] [facility]

## 2023-08-23 NOTE — Telephone Encounter (Signed)
 Requested Prescriptions  Pending Prescriptions Disp Refills   levothyroxine (SYNTHROID) 50 MCG tablet [Pharmacy Med Name: LEVOTHYROXINE 50 MCG TABLET] 90 tablet 0    Sig: Take 1 tablet (50 mcg total) by mouth daily.     Endocrinology:  Hypothyroid Agents Passed - 08/23/2023 10:53 AM      Passed - TSH in normal range and within 360 days    TSH  Date Value Ref Range Status  08/18/2023 4.300 0.450 - 4.500 uIU/mL Final         Passed - Valid encounter within last 12 months    Recent Outpatient Visits           5 days ago Type 2 diabetes mellitus with cardiac complication [facility])   [place] [facility] Lemar Pyles, NP       Future Appointments             In 7 months Elta Halter, MD [facility] [place] [facility]

## 2023-09-06 ENCOUNTER — Other Ambulatory Visit: Payer: Self-pay | Admitting: Nurse Practitioner

## 2023-09-06 DIAGNOSIS — M1A0720 Idiopathic chronic gout, left ankle and foot, without tophus (tophi): Secondary | ICD-10-CM

## 2023-09-08 NOTE — Telephone Encounter (Signed)
 Requested Prescriptions  Pending Prescriptions Disp Refills   febuxostat  (ULORIC ) 40 MG tablet [Pharmacy Med Name: FEBUXOSTAT  40 MG TABLET] 90 tablet 0    Sig: Take 1 tablet (40 mg total) by mouth daily.     Endocrinology: Gout Agents - febuxostat  Failed - 09/08/2023  4:06 PM      Failed - Uric Acid in normal range and within 360 days    Uric Acid  Date Value Ref Range Status  08/18/2023 3.2 (L) 3.8 - 8.4 mg/dL Final    Comment:               Therapeutic target for gout patients: <6.0         Failed - Cr in normal range and within 360 days    Creatinine, Ser  Date Value Ref Range Status  08/18/2023 1.41 (H) 0.76 - 1.27 mg/dL Final         Passed - AST in normal range and within 360 days    AST  Date Value Ref Range Status  08/18/2023 21 0 - 40 IU/L Final   AST (SGOT) Piccolo, Waived  Date Value Ref Range Status  01/10/2019 25 11 - 38 U/L Final         Passed - ALT in normal range and within 360 days    ALT  Date Value Ref Range Status  08/18/2023 35 0 - 44 IU/L Final   ALT (SGPT) Piccolo, Waived  Date Value Ref Range Status  01/10/2019 35 10 - 47 U/L Final         Passed - Valid encounter within last 12 months    Recent Outpatient Visits           3 weeks ago Type 2 diabetes mellitus with cardiac complication [facility])   [place] [facility] Lemar Pyles, NP       Future Appointments             In 7 months Elta Halter, MD [facility] [place] [facility]

## 2023-09-09 NOTE — Addendum Note (Signed)
 Addended by: Lott Rouleau A on: 09/09/2023 01:51 PM   Modules accepted: Orders

## 2023-09-09 NOTE — Progress Notes (Signed)
 Remote ICD transmission.

## 2023-10-09 ENCOUNTER — Other Ambulatory Visit: Payer: Self-pay | Admitting: Nurse Practitioner

## 2023-10-09 DIAGNOSIS — I429 Cardiomyopathy, unspecified: Secondary | ICD-10-CM

## 2023-10-10 NOTE — Telephone Encounter (Signed)
 Requested Prescriptions  Pending Prescriptions Disp Refills   dexlansoprazole  (DEXILANT ) 60 MG capsule [Pharmacy Med Name: DEXLANSOPRAZOLE  DR 60 MG CAP] 90 capsule 1    Sig: Take 1 capsule (60 mg total) by mouth daily.     Gastroenterology: Proton Pump Inhibitors Passed - 10/10/2023  4:48 PM      Passed - Valid encounter within last 12 months    Recent Outpatient Visits           1 month ago Type 2 diabetes mellitus with cardiac complication [facility])   [facility] Lemar Pyles, NP       Future Appointments             In 6 months Elta Halter, MD [facility] [place] [facility]

## 2023-10-18 ENCOUNTER — Other Ambulatory Visit: Payer: Self-pay | Admitting: Nurse Practitioner

## 2023-10-19 NOTE — Telephone Encounter (Signed)
 Requested by interface surescripts. Last OV 2 months ago .  Requested Prescriptions  Pending Prescriptions Disp Refills   JANUVIA  100 MG tablet [Pharmacy Med Name: JANUVIA  100MG  TAB] 90 tablet 0    Sig: TAKE ONE TABLET BY MOUTH EVERY DAY     Endocrinology:  Diabetes - DPP-4 Inhibitors Failed - 10/19/2023  3:47 PM      Failed - Cr in normal range and within 360 days    Creatinine, Ser  Date Value Ref Range Status  08/18/2023 1.41 (H) 0.76 - 1.27 mg/dL Final         Passed - HBA1C is between 0 and 7.9 and within 180 days    Hemoglobin A1C  Date Value Ref Range Status  11/08/2017 6.9  Final  11/08/2017 6.9  Final   HB A1C (BAYER DCA - WAIVED)  Date Value Ref Range Status  08/18/2023 7.9 (H) 4.8 - 5.6 % Final    Comment:             Prediabetes: 5.7 - 6.4          Diabetes: >6.4          Glycemic control for adults with diabetes: <7.0          Passed - Valid encounter within last 6 months    Recent Outpatient Visits           2 months ago Type 2 diabetes mellitus with cardiac complication [facility])    [facility] Lemar Pyles, NP       Future Appointments             In 6 months Elta Halter, MD [facility] [place] [facility]

## 2023-10-26 ENCOUNTER — Ambulatory Visit (INDEPENDENT_AMBULATORY_CARE_PROVIDER_SITE_OTHER): Payer: Medicare Other

## 2023-10-26 ENCOUNTER — Ambulatory Visit: Payer: Self-pay | Admitting: Cardiology

## 2023-10-26 DIAGNOSIS — I428 Other cardiomyopathies: Secondary | ICD-10-CM

## 2023-10-26 LAB — CUP PACEART REMOTE DEVICE CHECK
Battery Remaining Longevity: 9 mo
Battery Voltage: 2.86 V
Brady Statistic AP VP Percent: 4.51 %
Brady Statistic AP VS Percent: 0.01 %
Brady Statistic AS VP Percent: 95.48 %
Brady Statistic AS VS Percent: 0 %
Brady Statistic RA Percent Paced: 4.49 %
Brady Statistic RV Percent Paced: 99.98 %
Date Time Interrogation Session: 20250618001805
HighPow Impedance: 49 Ohm
HighPow Impedance: 65 Ohm
Implantable Lead Connection Status: 753985
Implantable Lead Connection Status: 753985
Implantable Lead Connection Status: 753985
Implantable Lead Implant Date: 20060505
Implantable Lead Implant Date: 20120111
Implantable Lead Implant Date: 20180314
Implantable Lead Location: 753858
Implantable Lead Location: 753859
Implantable Lead Location: 753860
Implantable Lead Model: 5076
Implantable Lead Model: 6947
Implantable Pulse Generator Implant Date: 20180314
Lead Channel Impedance Value: 1007 Ohm
Lead Channel Impedance Value: 222.34 Ohm
Lead Channel Impedance Value: 232.653
Lead Channel Impedance Value: 237.686
Lead Channel Impedance Value: 249.509
Lead Channel Impedance Value: 255.093
Lead Channel Impedance Value: 418 Ohm
Lead Channel Impedance Value: 418 Ohm
Lead Channel Impedance Value: 456 Ohm
Lead Channel Impedance Value: 475 Ohm
Lead Channel Impedance Value: 532 Ohm
Lead Channel Impedance Value: 551 Ohm
Lead Channel Impedance Value: 589 Ohm
Lead Channel Impedance Value: 665 Ohm
Lead Channel Impedance Value: 760 Ohm
Lead Channel Impedance Value: 779 Ohm
Lead Channel Impedance Value: 817 Ohm
Lead Channel Impedance Value: 893 Ohm
Lead Channel Pacing Threshold Amplitude: 0.5 V
Lead Channel Pacing Threshold Amplitude: 0.625 V
Lead Channel Pacing Threshold Amplitude: 1.375 V
Lead Channel Pacing Threshold Pulse Width: 0.4 ms
Lead Channel Pacing Threshold Pulse Width: 0.4 ms
Lead Channel Pacing Threshold Pulse Width: 0.4 ms
Lead Channel Sensing Intrinsic Amplitude: 1.125 mV
Lead Channel Sensing Intrinsic Amplitude: 1.125 mV
Lead Channel Setting Pacing Amplitude: 1.25 V
Lead Channel Setting Pacing Amplitude: 1.5 V
Lead Channel Setting Pacing Amplitude: 2 V
Lead Channel Setting Pacing Pulse Width: 0.4 ms
Lead Channel Setting Pacing Pulse Width: 0.4 ms
Lead Channel Setting Sensing Sensitivity: 0.3 mV
Zone Setting Status: 755011
Zone Setting Status: 755011

## 2023-11-02 ENCOUNTER — Telehealth: Payer: Self-pay | Admitting: Internal Medicine

## 2023-11-02 NOTE — Telephone Encounter (Signed)
 New Message:     Patient says he needs to talk to a nurse about seeing what the plans for him to get his Defibrillator changed.

## 2023-11-02 NOTE — Telephone Encounter (Signed)
 Spoke with patient and he states his last battery check states he had 3 months left of battery life. He states he is aware Dr.Klein is out on medical leave and may not return.   He would like to know who is monitoring his device and when will he get scheduled for battery replacement.

## 2023-11-12 NOTE — Patient Instructions (Signed)
Be Involved in Caring For Your Health:  Taking Medications When medications are taken as directed, they can greatly improve your health. But if they are not taken as prescribed, they may not work. In some cases, not taking them correctly can be harmful. To help ensure your treatment remains effective and safe, understand your medications and how to take them. Bring your medications to each visit for review by your provider.  Your lab results, notes, and after visit summary will be available on My Chart. We strongly encourage you to use this feature. If lab results are abnormal the clinic will contact you with the appropriate steps. If the clinic does not contact you assume the results are satisfactory. You can always view your results on My Chart. If you have questions regarding your health or results, please contact the clinic during office hours. You can also ask questions on My Chart.  We at [facility] are grateful that you chose [place] to provide your care. We strive to provide evidence-based and compassionate care and are always looking for feedback. If you get a survey from the clinic please complete this so we can hear your opinions.  Diabetes Mellitus and Exercise Regular exercise is important for your health, especially if you have diabetes mellitus. Exercise is not just about losing weight. It can also help you increase muscle strength and bone density and reduce body fat and stress. This can help your level of endurance and make you more fit and flexible. Why should I exercise if I have diabetes? Exercise has many benefits for people with diabetes. It can: Help lower and control your blood sugar (glucose). Help your body respond better and become more sensitive to the hormone insulin. Reduce how much insulin your body needs. Lower your risk for heart disease by: Lowering how much "bad" cholesterol and triglycerides you have in your body. Increasing how much "good" cholesterol  you have in your body. Lowering your blood pressure. Lowering your blood glucose levels. What is my activity plan? Your health care provider or an expert trained in diabetes care (certified diabetes educator) can help you make an activity plan. This plan can help you find the type of exercise that works for you. It may also tell you how often to exercise and for how long. Be sure to: Get at least 150 minutes of medium-intensity or high-intensity exercise each week. This may involve brisk walking, biking, or water aerobics. Do stretching and strengthening exercises at least 2 times a week. This may involve yoga or weight lifting. Spread out your activity over at least 3 days of the week. Get some form of physical activity each day. Do not go more than 2 days in a row without some kind of activity. Avoid being inactive for more than 30 minutes at a time. Take frequent breaks to walk or stretch. Choose activities that you enjoy. Set goals that you know you can accomplish. Start slowly and increase the intensity of your exercise over time. How do I manage my diabetes during exercise?  Monitor your blood glucose Check your blood glucose before and after you exercise. If your blood glucose is 240 mg/dL (40.9 mmol/L) or higher before you exercise, check your urine for ketones. These are chemicals created by the liver. If you have ketones in your urine, do not exercise until your blood glucose returns to normal. If your blood glucose is 100 mg/dL (5.6 mmol/L) or lower, eat a snack that has 15-20 grams of carbohydrate in  it. Check your blood glucose 15 minutes after the snack to make sure that your level is above 100 mg/dL (5.6 mmol/L) before you start to exercise. Your risk for low blood glucose (hypoglycemia) goes up during and after exercise. Know the symptoms of this condition and how to treat it. Follow these instructions at home: Keep a carbohydrate snack on hand for use before, during, and after  exercise. This can help prevent or treat hypoglycemia. Avoid injecting insulin into parts of your body that are going to be used during exercise. This may include: Your arms, when you are going to play tennis. Your legs, when you are about to go jogging. Keep track of your exercise habits. This can help you and your health care provider watch and adjust your activity plan. Write down: What you eat before and after you exercise. Blood glucose levels before and after you exercise. The type and amount of exercise you do. Talk to your health care provider before you start a new activity. They may need to: Make sure that the activity is safe for you. Adjust your insulin, other medicines, and food that you eat. Drink water while you exercise. This can stop you from losing too much water (dehydration). It can also prevent problems caused by having a lot of heat in your body (heat stroke). Where to find more information American Diabetes Association: diabetes.org Association of Diabetes Care & Education Specialists: diabeteseducator.org This information is not intended to replace advice given to you by your health care provider. Make sure you discuss any questions you have with your health care provider. Document Revised: 10/14/2021 Document Reviewed: 10/14/2021 Elsevier Patient Education  2024 [employer].

## 2023-11-13 ENCOUNTER — Other Ambulatory Visit: Payer: Self-pay | Admitting: Nurse Practitioner

## 2023-11-15 NOTE — Telephone Encounter (Signed)
 Requested by interface surescripts. Future visit in 3 days. Requested Prescriptions  Pending Prescriptions Disp Refills   carvedilol  (COREG ) 12.5 MG tablet [Pharmacy Med Name: CARVEDILOL  12.5 MG TABLET] 180 tablet 0    Sig: Take 1 tablet (12.5 mg total) by mouth 2 (two) times daily.     Cardiovascular: Beta Blockers 3 Failed - 11/15/2023  3:33 PM      Failed - Cr in normal range and within 360 days    Creatinine, Ser  Date Value Ref Range Status  08/18/2023 1.41 (H) 0.76 - 1.27 mg/dL Final         Passed - AST in normal range and within 360 days    AST  Date Value Ref Range Status  08/18/2023 21 0 - 40 IU/L Final   AST (SGOT) Piccolo, Waived  Date Value Ref Range Status  01/10/2019 25 11 - 38 U/L Final         Passed - ALT in normal range and within 360 days    ALT  Date Value Ref Range Status  08/18/2023 35 0 - 44 IU/L Final   ALT (SGPT) Piccolo, Waived  Date Value Ref Range Status  01/10/2019 35 10 - 47 U/L Final         Passed - Last BP in normal range    BP Readings from Last 1 Encounters:  08/18/23 136/75         Passed - Last Heart Rate in normal range    Pulse Readings from Last 1 Encounters:  08/18/23 65         Passed - Valid encounter within last 6 months    Recent Outpatient Visits           2 months ago Type 2 diabetes mellitus with cardiac complication [facility])   [place] [facility] Valerio Melanie DASEN, NP       Future Appointments             In 5 months Hester Alm BROCKS, MD [facility]

## 2023-11-16 DIAGNOSIS — H2513 Age-related nuclear cataract, bilateral: Secondary | ICD-10-CM | POA: Diagnosis not present

## 2023-11-16 DIAGNOSIS — H43811 Vitreous degeneration, right eye: Secondary | ICD-10-CM | POA: Diagnosis not present

## 2023-11-16 DIAGNOSIS — E119 Type 2 diabetes mellitus without complications: Secondary | ICD-10-CM | POA: Diagnosis not present

## 2023-11-16 LAB — HM DIABETES EYE EXAM

## 2023-11-17 ENCOUNTER — Encounter: Payer: Self-pay | Admitting: Nurse Practitioner

## 2023-11-17 ENCOUNTER — Other Ambulatory Visit: Payer: Self-pay | Admitting: Nurse Practitioner

## 2023-11-18 ENCOUNTER — Encounter: Payer: Self-pay | Admitting: Nurse Practitioner

## 2023-11-18 ENCOUNTER — Ambulatory Visit (INDEPENDENT_AMBULATORY_CARE_PROVIDER_SITE_OTHER): Admitting: Nurse Practitioner

## 2023-11-18 VITALS — BP 124/66 | HR 66 | Temp 97.7°F | Ht 72.5 in | Wt 195.0 lb

## 2023-11-18 DIAGNOSIS — E1122 Type 2 diabetes mellitus with diabetic chronic kidney disease: Secondary | ICD-10-CM

## 2023-11-18 DIAGNOSIS — E1169 Type 2 diabetes mellitus with other specified complication: Secondary | ICD-10-CM

## 2023-11-18 DIAGNOSIS — I429 Cardiomyopathy, unspecified: Secondary | ICD-10-CM

## 2023-11-18 DIAGNOSIS — G720 Drug-induced myopathy: Secondary | ICD-10-CM

## 2023-11-18 DIAGNOSIS — E119 Type 2 diabetes mellitus without complications: Secondary | ICD-10-CM

## 2023-11-18 DIAGNOSIS — Z95810 Presence of automatic (implantable) cardiac defibrillator: Secondary | ICD-10-CM

## 2023-11-18 DIAGNOSIS — I152 Hypertension secondary to endocrine disorders: Secondary | ICD-10-CM | POA: Diagnosis not present

## 2023-11-18 DIAGNOSIS — I5022 Chronic systolic (congestive) heart failure: Secondary | ICD-10-CM | POA: Diagnosis not present

## 2023-11-18 DIAGNOSIS — Z7984 Long term (current) use of oral hypoglycemic drugs: Secondary | ICD-10-CM

## 2023-11-18 DIAGNOSIS — Z6826 Body mass index (BMI) 26.0-26.9, adult: Secondary | ICD-10-CM | POA: Diagnosis not present

## 2023-11-18 DIAGNOSIS — E1159 Type 2 diabetes mellitus with other circulatory complications: Secondary | ICD-10-CM

## 2023-11-18 DIAGNOSIS — E039 Hypothyroidism, unspecified: Secondary | ICD-10-CM | POA: Diagnosis not present

## 2023-11-18 DIAGNOSIS — E785 Hyperlipidemia, unspecified: Secondary | ICD-10-CM | POA: Diagnosis not present

## 2023-11-18 DIAGNOSIS — N183 Chronic kidney disease, stage 3 unspecified: Secondary | ICD-10-CM

## 2023-11-18 LAB — BAYER DCA HB A1C WAIVED: HB A1C (BAYER DCA - WAIVED): 7 % — ABNORMAL HIGH (ref 4.8–5.6)

## 2023-11-18 MED ORDER — SPIRONOLACTONE 25 MG PO TABS: 12.5000 mg | ORAL_TABLET | Freq: Every day | ORAL | 3 refills | Status: AC

## 2023-11-18 NOTE — Telephone Encounter (Signed)
 spironolactone  (ALDACTONE ) 25 MG tablet 45 tablet 3 11/18/2023 --   Sig - Route: Take 0.5 tablets (12.5 mg total) by mouth daily. - Oral   Sent to pharmacy as: spironolactone  (ALDACTONE ) 25 MG tablet   E-Prescribing Status: Receipt confirmed by pharmacy (11/18/2023 10:25 AM EDT)

## 2023-11-18 NOTE — Assessment & Plan Note (Signed)
Ongoing, stable, followed by cardiology.  Continue this collaboration. 

## 2023-11-18 NOTE — Assessment & Plan Note (Signed)
 BMI 26.08.  Recommended eating smaller high protein, low fat meals more frequently and exercising 30 mins a day 5 times a week with a goal of 10-15lb weight loss in the next 3 months. Patient voiced their understanding and motivation to adhere to these recommendations.

## 2023-11-18 NOTE — Assessment & Plan Note (Signed)
Refer to diabetes with cardiac complication plan of care. 

## 2023-11-18 NOTE — Assessment & Plan Note (Signed)
 Chronic, ongoing.  Tolerating Zetia, will continue this.   Would benefit from statin use with diabetes and heart health or consider injectable, discussed with him at visits.  Lipid panel today.

## 2023-11-18 NOTE — Assessment & Plan Note (Signed)
Continue collaboration with cardiology and regular checks. 

## 2023-11-18 NOTE — Assessment & Plan Note (Signed)
 Due to statin therapy, history of on low doses.  Would benefit from statin use with diabetes and heart health or consider injectable, discussed with him today.  Continue Zetia as is tolerating.

## 2023-11-18 NOTE — Progress Notes (Signed)
 BP 124/66   Pulse 66   Temp 97.7 F (36.5 C) (Oral)   Ht 6' 0.5 (1.842 m)   Wt 195 lb (88.5 kg)   SpO2 99%   BMI 26.08 kg/m    Subjective:    Patient ID: Ryan [name], male    DOB: 19-Sep-1950, 73 y.o.   [name]: [id number]  HPI: Ryan [name] is a 73 y.o. male  Chief Complaint  Patient presents with   Diabetes   Hyperlipidemia   Hypertension   DIABETES A1c 7.9% in April.  Continues Farxiga  (gets via assistance) + Januvia  100 MG daily. Intolerance to Metformin  and Rybelsus , GI issues. Prefers no injectables. Has been active recently and drinking Glucerna shakes. Hypoglycemic episodes:no Polydipsia/polyuria: no Visual disturbance: no Chest pain: no Paresthesias: no Glucose Monitoring: no             Accucheck frequency: not checking             Fasting glucose:              Post prandial:             Evening:             Before meals: Taking Insulin?: no             Long acting insulin:             Short acting insulin: Blood Pressure Monitoring: daily Retinal Examination: Up to Date --  Foot Exam: Up to Date Pneumovax: refuses Influenza: refuses Aspirin: no    HYPERTENSION / HYPERLIPIDEMIA/HF Has cardio-defib implantable (replacement last in January 2012) last check 10/26/23. 9 months left on battery, he is thinking 12 months. Last cardiology visit on 02/14/23, Dr. Fernande.  Will be transferring to Dr. Cindie on review as Dr. Fernande on leave.  Takes Aldactone , Entresto  (gets via assistance), Coreg , Zetia  (tolerating this).  No statin, cardiology took him off of this years ago.  Pravastatin in past with poor response -- myalgia.   Satisfied with current treatment? yes Duration of hypertension: chronic BP monitoring frequency: a few times a week BP range: 110 to 120/70 to 80 range BP medication side effects: no Duration of hyperlipidemia: chronic Medication compliance: good compliance Aspirin: no Recent stressors: no Recurrent headaches: no Visual changes:  no Palpitations: no Dyspnea: no Chest pain: no Lower extremity edema: no Dizzy/lightheaded: no  The ASCVD Risk score (Arnett DK, et al., 2019) failed to calculate for the following reasons:   The valid total cholesterol range is 130 to 320 mg/dL   CHRONIC KIDNEY DISEASE (CKD 3a) CKD status: stable Medications renally dose: yes Previous renal evaluation: no Pneumovax:  refuses Influenza Vaccine:  refuses   HYPOTHYROIDISM Taking Levothyroxine  50 MCG daily.   Thyroid  control status:stable Satisfied with current treatment? yes Medication side effects: no Medication compliance: good compliance Etiology of hypothyroidism: unknown Recent dose adjustment:no Fatigue: no Cold intolerance: no Heat intolerance: no Weight gain: no Weight loss: no Constipation: no Diarrhea/loose stools:  no Palpitations: no Lower extremity edema: no Anxiety/depressed mood: no      11/18/2023   10:14 AM 05/18/2023   10:17 AM 02/22/2023   12:56 PM 02/09/2023    9:35 AM 11/09/2022    8:33 AM  Depression screen PHQ 2/9  Decreased Interest 0 0 0 0 0  Down, Depressed, Hopeless 0 0 0 0 0  PHQ - 2 Score 0 0 0 0 0  Altered sleeping 0 0 0 0 0  Tired,  decreased energy 0 0 0 0 0  Change in appetite 0 0 0 0 0  Feeling bad or failure about yourself  0 0 0 0 0  Trouble concentrating 0 0 0 0 0  Moving slowly or fidgety/restless 0 0 0 0 0  Suicidal thoughts 0 0 0 0 0  PHQ-9 Score 0 0 0 0 0  Difficult doing work/chores Not difficult at all Not difficult at all Not difficult at all  Not difficult at all       11/18/2023   10:14 AM 05/18/2023   10:17 AM 02/09/2023    9:36 AM 11/09/2022    8:33 AM  GAD 7 : Generalized Anxiety Score  Nervous, Anxious, on Edge 0 0 0 0  Control/stop worrying 0 0 0 0  Worry too much - different things 0 0 0 0  Trouble relaxing 0 0 0 0  Restless 0 0 0 0  Easily annoyed or irritable 0 0 0 0  Afraid - awful might happen 0 0 0 0  Total GAD 7 Score 0 0 0 0  Anxiety Difficulty Not  difficult at all Not difficult at all  Not difficult at all   Relevant past medical, surgical, family and social history reviewed and updated as indicated. Interim medical history since our last visit reviewed. Allergies and medications reviewed and updated.  Review of Systems  Constitutional:  Negative for activity change, diaphoresis, fatigue and fever.  Respiratory:  Negative for cough, chest tightness, shortness of breath and wheezing.   Cardiovascular:  Negative for chest pain, palpitations and leg swelling.  Gastrointestinal: Negative.   Endocrine: Negative for polydipsia, polyphagia and polyuria.  Neurological: Negative.   Psychiatric/Behavioral: Negative.      Per HPI unless specifically indicated above     Objective:    BP 124/66   Pulse 66   Temp 97.7 F (36.5 C) (Oral)   Ht 6' 0.5 (1.842 m)   Wt 195 lb (88.5 kg)   SpO2 99%   BMI 26.08 kg/m   Wt Readings from Last 3 Encounters:  11/18/23 195 lb (88.5 kg)  08/18/23 200 lb 9.6 oz (91 kg)  05/18/23 201 lb 6.4 oz (91.4 kg)    Physical Exam Vitals and nursing note reviewed.  Constitutional:      General: He is awake. He is not in acute distress.    Appearance: He is well-developed and well-groomed. He is not ill-appearing or toxic-appearing.  HENT:     Head: Normocephalic.     Right Ear: Hearing and external ear normal.     Left Ear: Hearing and external ear normal.  Eyes:     General: Lids are normal.     Extraocular Movements: Extraocular movements intact.     Conjunctiva/sclera: Conjunctivae normal.  Neck:     Thyroid : No thyromegaly.     Vascular: No carotid bruit.  Cardiovascular:     Rate and Rhythm: Normal rate and regular rhythm.     Heart sounds: Normal heart sounds. No murmur heard.    No gallop.  Pulmonary:     Effort: No accessory muscle usage or respiratory distress.     Breath sounds: Normal breath sounds.  Abdominal:     General: Bowel sounds are normal. There is no distension.      Palpations: Abdomen is soft.     Tenderness: There is no abdominal tenderness.  Musculoskeletal:     Cervical back: Full passive range of motion without pain.  Right lower leg: No edema.     Left lower leg: No edema.  Lymphadenopathy:     Cervical: No cervical adenopathy.  Skin:    General: Skin is warm.     Capillary Refill: Capillary refill takes less than 2 seconds.  Neurological:     Mental Status: He is alert and oriented to person, place, and time.     Deep Tendon Reflexes: Reflexes are normal and symmetric.     Reflex Scores:      Brachioradialis reflexes are 2+ on the right side and 2+ on the left side.      Patellar reflexes are 2+ on the right side and 2+ on the left side. Psychiatric:        Attention and Perception: Attention normal.        Mood and Affect: Mood normal.        Speech: Speech normal.        Behavior: Behavior normal. Behavior is cooperative.        Thought Content: Thought content normal.    Results for orders placed or performed in visit on 11/17/23  HM DIABETES EYE EXAM   Collection Time: 11/16/23 12:02 PM  Result Value Ref Range   HM Diabetic Eye Exam No Retinopathy No Retinopathy      Assessment & Plan:   Problem List Items Addressed This Visit       Cardiovascular and Mediastinum   Type 2 diabetes mellitus with cardiac complication (HCC) - Primary   Chronic, ongoing CKD 3a.  A1c 7%, trend down, urine ALB 09 June 2023.  Rybelsus  & Metformin  caused GI issues.  We discussed and could consider trial of GLP1 injectable in future if needed, which he would be willing to try.  If poor tolerance then consider Glipizide .  Maintain current regimen at this time. - Continue current medication regimen with Farxiga  and Januvia  100 MG + continue collaboration with cardiology.   - Recommend he check BS at home at least a few mornings a week with goal fasting <130, bring to next visit.   - Continue to collaborate with PharmD.  - Up to date on eye and  foot exam - Taking ARB, no statin (per cardiology this was stopped in past) - takes Zetia . - Refuses vaccinations.      Relevant Medications   spironolactone  (ALDACTONE ) 25 MG tablet   Other Relevant Orders   Bayer DCA Hb A1c Waived   Systolic CHF (HCC)   Chronic, stable.  Euvolemic today.  Continue current medication regimen and collaboration with cardiology.  LABS: CMP.   Recommend: - Reminded to call for an overnight weight gain of >2 pounds or a weekly weight weight of >5 pounds - not adding salt to his food and has been reading food labels. Reviewed the importance of keeping daily sodium intake to 2000mg  daily  - Avoid NSAIDS      Relevant Medications   spironolactone  (ALDACTONE ) 25 MG tablet   Idiopathic cardiomyopathy (HCC)   Ongoing, stable, followed by cardiology.  Continue this collaboration.      Relevant Medications   spironolactone  (ALDACTONE ) 25 MG tablet   Hypertension associated with diabetes (HCC)   Chronic, stable.  BP at goal in office today.  Continue current medication regimen and collaboration with cardiology.  Recommend checking BP at home at least a few mornings a week + focus on DASH diet.  LABS: CMP.  Urine ALB 09 June 2023.  Return in 3 months.  Relevant Medications   spironolactone  (ALDACTONE ) 25 MG tablet   Other Relevant Orders   Bayer DCA Hb A1c Waived     Endocrine   Hypothyroidism   Chronic, ongoing.  Continue current medication regimen and adjust as needed based on labs.  TSH + Free T4 at physical in April 2026.      Hyperlipidemia associated with type 2 diabetes mellitus (HCC)   Chronic, ongoing.  Tolerating Zetia , will continue this.   Would benefit from statin use with diabetes and heart health or consider injectable, discussed with him at visits.  Lipid panel today.        Relevant Medications   spironolactone  (ALDACTONE ) 25 MG tablet   Other Relevant Orders   Bayer DCA Hb A1c Waived   Comprehensive metabolic panel with GFR    Lipid Panel w/o Chol/HDL Ratio   Diabetes mellitus treated with oral medication (HCC)   Refer to diabetes with cardiac complication plan of care.      Relevant Orders   Bayer DCA Hb A1c Waived   CKD stage 3 due to type 2 diabetes mellitus (HCC)   Chronic, ongoing CKD 3a.  A1c 7%, trend down, urine ALB 09 June 2023.  Rybelsus  & Metformin  caused GI issues.  We discussed and could consider trial of GLP1 injectable in future if needed, which he would be willing to try.  If poor tolerance then consider Glipizide .  Maintain current regimen at this time. - Continue current medication regimen with Farxiga  and Januvia  100 MG + continue collaboration with cardiology.   - Recommend he check BS at home at least a few mornings a week with goal fasting <130, bring to next visit.   - Continue to collaborate with PharmD.  - Up to date on eye and foot exam - Taking ARB, no statin (per cardiology this was stopped in past) - takes Zetia . - Refuses vaccinations.      Relevant Orders   Bayer DCA Hb A1c Waived     Musculoskeletal and Integument   Drug-induced myopathy   Due to statin therapy, history of on low doses.  Would benefit from statin use with diabetes and heart health or consider injectable, discussed with him today.  Continue Zetia  as is tolerating.        Other   BMI 26.0-26.9,adult   BMI 26.08.  Recommended eating smaller high protein, low fat meals more frequently and exercising 30 mins a day 5 times a week with a goal of 10-15lb weight loss in the next 3 months. Patient voiced their understanding and motivation to adhere to these recommendations.       Biventricular implantable cardioverter-defibrillator in situ     Follow up plan: Return in about 3 months (around 02/18/2024) for T2DM, HTN/HLD, CKD.

## 2023-11-18 NOTE — Assessment & Plan Note (Signed)
 Chronic, ongoing.  Continue current medication regimen and adjust as needed based on labs.  TSH + Free T4 at physical in April 2026.

## 2023-11-18 NOTE — Assessment & Plan Note (Signed)
 Chronic, stable.  BP at goal in office today.  Continue current medication regimen and collaboration with cardiology.  Recommend checking BP at home at least a few mornings a week + focus on DASH diet.  LABS: CMP.  Urine ALB 09 June 2023.  Return in 3 months.

## 2023-11-18 NOTE — Assessment & Plan Note (Signed)
 Chronic, ongoing CKD 3a.  A1c 7%, trend down, urine ALB 09 June 2023.  Rybelsus  & Metformin  caused GI issues.  We discussed and could consider trial of GLP1 injectable in future if needed, which Ryan [name] would be willing to try.  If poor tolerance then consider Glipizide .  Maintain current regimen at this time. - Continue current medication regimen with Farxiga  and Januvia  100 MG + continue collaboration with cardiology.   - Recommend Ryan [name] check BS at home at least a few mornings a week with goal fasting <130, bring to next visit.   - Continue to collaborate with PharmD.  - Up to date on eye and foot exam - Taking ARB, no statin (per cardiology this was stopped in past) - takes Zetia . - Refuses vaccinations.

## 2023-11-18 NOTE — Assessment & Plan Note (Signed)
 Chronic, ongoing CKD 3a.  A1c 7%, trend down, urine ALB 09 June 2023.  Rybelsus  & Metformin  caused GI issues.  We discussed and could consider trial of GLP1 injectable in future if needed, which he would be willing to try.  If poor tolerance then consider Glipizide .  Maintain current regimen at this time. - Continue current medication regimen with Farxiga  and Januvia  100 MG + continue collaboration with cardiology.   - Recommend he check BS at home at least a few mornings a week with goal fasting <130, bring to next visit.   - Continue to collaborate with PharmD.  - Up to date on eye and foot exam - Taking ARB, no statin (per cardiology this was stopped in past) - takes Zetia . - Refuses vaccinations.

## 2023-11-18 NOTE — Assessment & Plan Note (Signed)
Chronic, stable.  Euvolemic today.  Continue current medication regimen and collaboration with cardiology.  LABS: CMP.   Recommend: - Reminded to call for an overnight weight gain of >2 pounds or a weekly weight weight of >5 pounds - not adding salt to his food and has been reading food labels. Reviewed the importance of keeping daily sodium intake to 2000mg  daily  - Avoid NSAIDS

## 2023-11-19 ENCOUNTER — Ambulatory Visit: Payer: Self-pay | Admitting: Nurse Practitioner

## 2023-11-19 LAB — COMPREHENSIVE METABOLIC PANEL WITH GFR
ALT: 34 IU/L (ref 0–44)
AST: 19 IU/L (ref 0–40)
Albumin: 4.6 g/dL (ref 3.8–4.8)
Alkaline Phosphatase: 46 IU/L (ref 44–121)
BUN/Creatinine Ratio: 16 (ref 10–24)
BUN: 22 mg/dL (ref 8–27)
Bilirubin Total: 0.6 mg/dL (ref 0.0–1.2)
CO2: 18 mmol/L — ABNORMAL LOW (ref 20–29)
Calcium: 9.6 mg/dL (ref 8.6–10.2)
Chloride: 104 mmol/L (ref 96–106)
Creatinine, Ser: 1.37 mg/dL — ABNORMAL HIGH (ref 0.76–1.27)
Globulin, Total: 2.3 g/dL (ref 1.5–4.5)
Glucose: 213 mg/dL — ABNORMAL HIGH (ref 70–99)
Potassium: 4.4 mmol/L (ref 3.5–5.2)
Sodium: 139 mmol/L (ref 134–144)
Total Protein: 6.9 g/dL (ref 6.0–8.5)
eGFR: 54 mL/min/1.73 — ABNORMAL LOW (ref >59)

## 2023-11-19 LAB — LIPID PANEL W/O CHOL/HDL RATIO
Cholesterol, Total: 136 mg/dL (ref 100–199)
HDL: 34 mg/dL — ABNORMAL LOW (ref >39)
LDL Chol Calc (NIH): 62 mg/dL (ref 0–99)
Triglycerides: 251 mg/dL — ABNORMAL HIGH (ref 0–149)
VLDL Cholesterol Cal: 40 mg/dL (ref 5–40)

## 2023-11-19 NOTE — Progress Notes (Signed)
 Contacted via MyChart  Good afternoon Ryan [name], your labs have returned: - Kidney function, creatinine and eGFR, continues to show stable chronic kidney disease Stage 3a.  Liver function, AST and ALT, is normal. - Overall lipid panel does show stable LDL level.  Triglycerides remain a little elevated at 251, continue Zetia  and focus on healthy diet choices.  Any questions? Keep being stellar!!  Thank you for allowing me to participate in your care.  I appreciate you. Kindest regards, Macon Sandiford

## 2023-11-20 ENCOUNTER — Other Ambulatory Visit: Payer: Self-pay | Admitting: Nurse Practitioner

## 2023-11-22 ENCOUNTER — Telehealth: Payer: Self-pay

## 2023-11-22 NOTE — Progress Notes (Signed)
   11/22/2023  Patient ID: Ryan [name], male   DOB: 20-Feb-1951, 73 y.o.   [name]: [id number]  Missed call/voicemail from patient earlier today; I attempted to return his call but did not get an answer.  HIPAA compliant voicemail left with my direct phone number.  Channing DELENA Mealing, PharmD, DPLA

## 2023-11-22 NOTE — Progress Notes (Signed)
   11/22/2023  Patient ID: Ryan [name], male   DOB: 1950/07/01, 73 y.o.   [name]: [id number]  Made contact with patient, and he is needing a refill on Entreso 24/26mg  he receives through [employer] [facility].  He contacted the program, and is not sure his refill processed appropriately; but the automated system states he should get confirmation in 3 days.  Patient has 10-12 days of medication on hand and will contact me Friday if he has not received confirmation that refill is processing/shipping.  Channing DELENA Mealing, PharmD, DPLA

## 2023-11-22 NOTE — Telephone Encounter (Signed)
 No longer current dosing of this medication.  Requested Prescriptions  Pending Prescriptions Disp Refills   spironolactone  (ALDACTONE ) 25 MG tablet [Pharmacy Med Name: SPIRONOLACTONE  25 MG TABLET] 90 tablet 0    Sig: Take 1 tablet (25 mg total) by mouth daily.     There is no refill protocol information for this order

## 2023-11-24 NOTE — Progress Notes (Signed)
   11/24/2023  Patient ID: Ryan [name], male   DOB: 11-24-1950, 73 y.o.   [name]: [id number]  Patient calling to let me know he received 3 months of Entresto  24/26mg  today from [facility].  Channing DELENA Mealing, PharmD, DPLA

## 2024-01-04 NOTE — Progress Notes (Signed)
 Remote ICD transmission.

## 2024-01-05 ENCOUNTER — Telehealth: Payer: Self-pay | Admitting: Nurse Practitioner

## 2024-01-05 NOTE — Telephone Encounter (Signed)
 Copied from CRM [id number]. Topic: Medicare AWV >> Jan 05, 2024  1:51 PM Nathanel DEL wrote: Reason for CRM: LVM 01/05/24 to move AWV appt time from 12:30 to 3:10p on 02/28/24, due to nha lunch scheduled. Please call to confirm time change khc  Nathanel Paschal; Care Guide Ambulatory Clinical Support [place] [facility] Direct Dial: [phone]

## 2024-01-10 ENCOUNTER — Other Ambulatory Visit: Payer: Self-pay | Admitting: Nurse Practitioner

## 2024-01-10 DIAGNOSIS — M1A0720 Idiopathic chronic gout, left ankle and foot, without tophus (tophi): Secondary | ICD-10-CM

## 2024-01-10 NOTE — Telephone Encounter (Signed)
 Requested Prescriptions  Pending Prescriptions Disp Refills   febuxostat  (ULORIC ) 40 MG tablet [Pharmacy Med Name: FEBUXOSTAT  40 MG TABLET] 90 tablet 0    Sig: Take 1 tablet (40 mg total) by mouth daily.     Endocrinology: Gout Agents - febuxostat  Failed - 01/10/2024  3:45 PM      Failed - Uric Acid in normal range and within 360 days    Uric Acid  Date Value Ref Range Status  08/18/2023 3.2 (L) 3.8 - 8.4 mg/dL Final    Comment:               Therapeutic target for gout patients: <6.0         Failed - Cr in normal range and within 360 days    Creatinine, Ser  Date Value Ref Range Status  11/18/2023 1.37 (H) 0.76 - 1.27 mg/dL Final         Passed - AST in normal range and within 360 days    AST  Date Value Ref Range Status  11/18/2023 19 0 - 40 IU/L Final   AST (SGOT) Piccolo, Waived  Date Value Ref Range Status  01/10/2019 25 11 - 38 U/L Final         Passed - ALT in normal range and within 360 days    ALT  Date Value Ref Range Status  11/18/2023 34 0 - 44 IU/L Final   ALT (SGPT) Piccolo, Waived  Date Value Ref Range Status  01/10/2019 35 10 - 47 U/L Final         Passed - Valid encounter within last 12 months    Recent Outpatient Visits           1 month ago Type 2 diabetes mellitus with cardiac complication (HCC)   [facility] Justice, [name] T, NP   4 months ago Type 2 diabetes mellitus with cardiac complication [facility])   [facility] Valerio Melanie DASEN, NP       Future Appointments             In 3 months Hester Alm BROCKS, MD [facility]

## 2024-01-25 ENCOUNTER — Ambulatory Visit (INDEPENDENT_AMBULATORY_CARE_PROVIDER_SITE_OTHER): Payer: Medicare Other

## 2024-01-25 DIAGNOSIS — I428 Other cardiomyopathies: Secondary | ICD-10-CM | POA: Diagnosis not present

## 2024-01-26 ENCOUNTER — Ambulatory Visit: Payer: Self-pay | Admitting: Cardiology

## 2024-01-26 LAB — CUP PACEART REMOTE DEVICE CHECK
Battery Remaining Longevity: 6 mo
Battery Voltage: 2.85 V
Brady Statistic AP VP Percent: 4.64 %
Brady Statistic AP VS Percent: 0.01 %
Brady Statistic AS VP Percent: 95.33 %
Brady Statistic AS VS Percent: 0.02 %
Brady Statistic RA Percent Paced: 4.63 %
Brady Statistic RV Percent Paced: 99.91 %
Date Time Interrogation Session: 20250917012305
HighPow Impedance: 49 Ohm
HighPow Impedance: 65 Ohm
Implantable Lead Connection Status: 753985
Implantable Lead Connection Status: 753985
Implantable Lead Connection Status: 753985
Implantable Lead Implant Date: 20060505
Implantable Lead Implant Date: 20120111
Implantable Lead Implant Date: 20180314
Implantable Lead Location: 753858
Implantable Lead Location: 753859
Implantable Lead Location: 753860
Implantable Lead Model: 5076
Implantable Lead Model: 6947
Implantable Pulse Generator Implant Date: 20180314
Lead Channel Impedance Value: 230.327
Lead Channel Impedance Value: 234.08 Ohm
Lead Channel Impedance Value: 241.412
Lead Channel Impedance Value: 245.538
Lead Channel Impedance Value: 261.164
Lead Channel Impedance Value: 418 Ohm
Lead Channel Impedance Value: 418 Ohm
Lead Channel Impedance Value: 456 Ohm
Lead Channel Impedance Value: 513 Ohm
Lead Channel Impedance Value: 513 Ohm
Lead Channel Impedance Value: 532 Ohm
Lead Channel Impedance Value: 551 Ohm
Lead Channel Impedance Value: 703 Ohm
Lead Channel Impedance Value: 779 Ohm
Lead Channel Impedance Value: 817 Ohm
Lead Channel Impedance Value: 836 Ohm
Lead Channel Impedance Value: 874 Ohm
Lead Channel Impedance Value: 931 Ohm
Lead Channel Pacing Threshold Amplitude: 0.625 V
Lead Channel Pacing Threshold Amplitude: 0.625 V
Lead Channel Pacing Threshold Amplitude: 1 V
Lead Channel Pacing Threshold Pulse Width: 0.4 ms
Lead Channel Pacing Threshold Pulse Width: 0.4 ms
Lead Channel Pacing Threshold Pulse Width: 0.4 ms
Lead Channel Sensing Intrinsic Amplitude: 1.375 mV
Lead Channel Sensing Intrinsic Amplitude: 1.375 mV
Lead Channel Sensing Intrinsic Amplitude: 19.625 mV
Lead Channel Sensing Intrinsic Amplitude: 19.625 mV
Lead Channel Setting Pacing Amplitude: 1.25 V
Lead Channel Setting Pacing Amplitude: 1.5 V
Lead Channel Setting Pacing Amplitude: 2 V
Lead Channel Setting Pacing Pulse Width: 0.4 ms
Lead Channel Setting Pacing Pulse Width: 0.4 ms
Lead Channel Setting Sensing Sensitivity: 0.3 mV
Zone Setting Status: 755011
Zone Setting Status: 755011

## 2024-01-31 NOTE — Progress Notes (Signed)
Remote ICD Transmission.

## 2024-02-10 ENCOUNTER — Other Ambulatory Visit: Payer: Self-pay | Admitting: Nurse Practitioner

## 2024-02-13 NOTE — Telephone Encounter (Signed)
 Requested Prescriptions  Pending Prescriptions Disp Refills   carvedilol  (COREG ) 12.5 MG tablet [Pharmacy Med Name: CARVEDILOL  12.5 MG TABLET] 180 tablet 0    Sig: Take 1 tablet (12.5 mg total) by mouth 2 (two) times daily.     Cardiovascular: Beta Blockers 3 Failed - 02/13/2024  9:41 AM      Failed - Cr in normal range and within 360 days    Creatinine, Ser  Date Value Ref Range Status  11/18/2023 1.37 (H) 0.76 - 1.27 mg/dL Final         Passed - AST in normal range and within 360 days    AST  Date Value Ref Range Status  11/18/2023 19 0 - 40 IU/L Final   AST (SGOT) Piccolo, Waived  Date Value Ref Range Status  01/10/2019 25 11 - 38 U/L Final         Passed - ALT in normal range and within 360 days    ALT  Date Value Ref Range Status  11/18/2023 34 0 - 44 IU/L Final   ALT (SGPT) Piccolo, Waived  Date Value Ref Range Status  01/10/2019 35 10 - 47 U/L Final         Passed - Last BP in normal range    BP Readings from Last 1 Encounters:  11/18/23 124/66         Passed - Last Heart Rate in normal range    Pulse Readings from Last 1 Encounters:  11/18/23 66         Passed - Valid encounter within last 6 months    Recent Outpatient Visits           2 months ago Type 2 diabetes mellitus with cardiac complication (HCC)   [facility] Coyle, Ucon T, NP   5 months ago Type 2 diabetes mellitus with cardiac complication [facility])   [facility] Valerio Melanie DASEN, NP       Future Appointments             In 2 months Hester Alm BROCKS, MD [facility]

## 2024-02-18 NOTE — Patient Instructions (Signed)

## 2024-02-20 ENCOUNTER — Other Ambulatory Visit: Payer: Self-pay | Admitting: Nurse Practitioner

## 2024-02-20 ENCOUNTER — Encounter: Payer: Self-pay | Admitting: Nurse Practitioner

## 2024-02-20 ENCOUNTER — Ambulatory Visit (INDEPENDENT_AMBULATORY_CARE_PROVIDER_SITE_OTHER): Admitting: Nurse Practitioner

## 2024-02-20 VITALS — BP 138/78 | HR 70 | Temp 97.9°F | Ht 72.5 in | Wt 196.6 lb

## 2024-02-20 DIAGNOSIS — I152 Hypertension secondary to endocrine disorders: Secondary | ICD-10-CM | POA: Diagnosis not present

## 2024-02-20 DIAGNOSIS — Z95810 Presence of automatic (implantable) cardiac defibrillator: Secondary | ICD-10-CM

## 2024-02-20 DIAGNOSIS — I429 Cardiomyopathy, unspecified: Secondary | ICD-10-CM | POA: Diagnosis not present

## 2024-02-20 DIAGNOSIS — Z6826 Body mass index (BMI) 26.0-26.9, adult: Secondary | ICD-10-CM

## 2024-02-20 DIAGNOSIS — G720 Drug-induced myopathy: Secondary | ICD-10-CM | POA: Diagnosis not present

## 2024-02-20 DIAGNOSIS — N183 Chronic kidney disease, stage 3 unspecified: Secondary | ICD-10-CM | POA: Diagnosis not present

## 2024-02-20 DIAGNOSIS — I5022 Chronic systolic (congestive) heart failure: Secondary | ICD-10-CM | POA: Diagnosis not present

## 2024-02-20 DIAGNOSIS — Z2821 Immunization not carried out because of patient refusal: Secondary | ICD-10-CM

## 2024-02-20 DIAGNOSIS — E1122 Type 2 diabetes mellitus with diabetic chronic kidney disease: Secondary | ICD-10-CM

## 2024-02-20 DIAGNOSIS — Z7984 Long term (current) use of oral hypoglycemic drugs: Secondary | ICD-10-CM

## 2024-02-20 DIAGNOSIS — E1159 Type 2 diabetes mellitus with other circulatory complications: Secondary | ICD-10-CM | POA: Diagnosis not present

## 2024-02-20 DIAGNOSIS — E039 Hypothyroidism, unspecified: Secondary | ICD-10-CM | POA: Diagnosis not present

## 2024-02-20 DIAGNOSIS — E119 Type 2 diabetes mellitus without complications: Secondary | ICD-10-CM

## 2024-02-20 DIAGNOSIS — E1169 Type 2 diabetes mellitus with other specified complication: Secondary | ICD-10-CM

## 2024-02-20 LAB — BAYER DCA HB A1C WAIVED: HB A1C (BAYER DCA - WAIVED): 6.9 % — ABNORMAL HIGH (ref 4.8–5.6)

## 2024-02-20 MED ORDER — FEBUXOSTAT 40 MG PO TABS: 40.0000 mg | ORAL_TABLET | Freq: Every day | ORAL | 3 refills | Status: AC

## 2024-02-20 MED ORDER — EZETIMIBE 10 MG PO TABS: 10.0000 mg | ORAL_TABLET | Freq: Every day | ORAL | 3 refills | Status: AC

## 2024-02-20 MED ORDER — CARVEDILOL 12.5 MG PO TABS: 12.5000 mg | ORAL_TABLET | Freq: Two times a day (BID) | ORAL | 4 refills | Status: AC

## 2024-02-20 NOTE — Progress Notes (Signed)
 BP 138/78   Pulse 70   Temp 97.9 F (36.6 C) (Oral)   Ht 6' 0.5 (1.842 m)   Wt 196 lb 9.6 oz (89.2 kg)   SpO2 97%   BMI 26.30 kg/m    Subjective:    Patient ID: Ryan [name], male    DOB: [birth year], 73 y.o.   [name]: [id number]  HPI: Ryan [name] is a 73 y.o. male  Chief Complaint  Patient presents with   Diabetes   Hyperlipidemia   Hypertension   DIABETES Was 7% July 2025.  Continues to take Farxiga  (gets via assistance) + Januvia  100 MG daily. Intolerance to Metformin  and Rybelsus , GI issues. Prefers no injectables. Continues to be very active. Hypoglycemic episodes:no Polydipsia/polyuria: no Visual disturbance: no Chest pain: no Paresthesias: no Glucose Monitoring: no             Accucheck frequency: not checking             Fasting glucose:              Post prandial:             Evening:             Before meals: Taking Insulin?: no             Long acting insulin:             Short acting insulin: Blood Pressure Monitoring: daily Retinal Examination: Up to Date -- [place] Foot Exam: Up to Date Pneumovax: refuses Influenza: refuses Aspirin: no    HYPERTENSION / HYPERLIPIDEMIA/HF Last saw cardiology on 02/14/23, is scheduled to see Dr. Cindie beginning of December. Has cardio-defib implantable (replacement last in January 2012) last check 01/26/24. He figures they will change battery in January or February.  Continues to take Aldactone , Entresto  (gets via assistance, but this program is stopping due to generic), Coreg , Zetia  (tolerating this).  No statin, cardiology took him off of this years ago.  Pravastatin in past with poor response -- myalgia.   Satisfied with current treatment? yes Duration of hypertension: chronic BP monitoring frequency: occasionally BP range: 120/70-80 BP medication side effects: no Duration of hyperlipidemia: chronic Medication compliance: good compliance Aspirin: no Recent stressors: no Recurrent headaches: no Visual  changes: no Palpitations: no Dyspnea: no Chest pain: no Lower extremity edema: no Dizzy/lightheaded: no  The 10-year ASCVD risk score (Arnett DK, et al., 2019) is: 47.1%   Values used to calculate the score:     Age: 22 years     Clincally relevant sex: Male     Is Non-Hispanic African American: No     Diabetic: Yes     Tobacco smoker: No     Systolic Blood Pressure: 138 mmHg     Is BP treated: Yes     HDL Cholesterol: 34 mg/dL     Total Cholesterol: 136 mg/dL   CHRONIC KIDNEY DISEASE (CKD 3a) CKD status: stable Medications renally dose: yes Previous renal evaluation: no Pneumovax:  refuses Influenza Vaccine:  refuses   HYPOTHYROIDISM Takes Levothyroxine  50 MCG daily.   Thyroid  control status:stable Satisfied with current treatment? yes Medication side effects: no Medication compliance: good compliance Etiology of hypothyroidism: unknown Recent dose adjustment:no Fatigue: no Cold intolerance: no Heat intolerance: no Weight gain: no Weight loss: no Constipation: no Diarrhea/loose stools:  no Palpitations: no Lower extremity edema: no Anxiety/depressed mood: no      02/20/2024   10:15 AM 11/18/2023   10:14 AM 05/18/2023  10:17 AM 02/22/2023   12:56 PM 02/09/2023    9:35 AM  Depression screen PHQ 2/9  Decreased Interest 0 0 0 0 0  Down, Depressed, Hopeless 0 0 0 0 0  PHQ - 2 Score 0 0 0 0 0  Altered sleeping 0 0 0 0 0  Tired, decreased energy 0 0 0 0 0  Change in appetite 0 0 0 0 0  Feeling bad or failure about yourself  0 0 0 0 0  Trouble concentrating 0 0 0 0 0  Moving slowly or fidgety/restless 0 0 0 0 0  Suicidal thoughts 0 0 0 0 0  PHQ-9 Score 0 0 0 0 0  Difficult doing work/chores Not difficult at all Not difficult at all Not difficult at all Not difficult at all        02/20/2024   10:15 AM 11/18/2023   10:14 AM 05/18/2023   10:17 AM 02/09/2023    9:36 AM  GAD 7 : Generalized Anxiety Score  Nervous, Anxious, on Edge 0 0 0 0  Control/stop worrying  0 0 0 0  Worry too much - different things 0 0 0 0  Trouble relaxing 0 0 0 0  Restless 0 0 0 0  Easily annoyed or irritable 0 0 0 0  Afraid - awful might happen 0 0 0 0  Total GAD 7 Score 0 0 0 0  Anxiety Difficulty Not difficult at all Not difficult at all Not difficult at all    Relevant past medical, surgical, family and social history reviewed and updated as indicated. Interim medical history since our last visit reviewed. Allergies and medications reviewed and updated.  Review of Systems  Constitutional:  Negative for activity change, diaphoresis, fatigue and fever.  Respiratory:  Negative for cough, chest tightness, shortness of breath and wheezing.   Cardiovascular:  Negative for chest pain, palpitations and leg swelling.  Gastrointestinal: Negative.   Endocrine: Negative for polydipsia, polyphagia and polyuria.  Neurological: Negative.   Psychiatric/Behavioral: Negative.      Per HPI unless specifically indicated above     Objective:    BP 138/78   Pulse 70   Temp 97.9 F (36.6 C) (Oral)   Ht 6' 0.5 (1.842 m)   Wt 196 lb 9.6 oz (89.2 kg)   SpO2 97%   BMI 26.30 kg/m   Wt Readings from Last 3 Encounters:  02/20/24 196 lb 9.6 oz (89.2 kg)  11/18/23 195 lb (88.5 kg)  08/18/23 200 lb 9.6 oz (91 kg)    Physical Exam Vitals and nursing note reviewed.  Constitutional:      General: He is awake. He is not in acute distress.    Appearance: He is well-developed and well-groomed. He is not ill-appearing or toxic-appearing.  HENT:     Head: Normocephalic.     Right Ear: Hearing and external ear normal.     Left Ear: Hearing and external ear normal.  Eyes:     General: Lids are normal.     Extraocular Movements: Extraocular movements intact.     Conjunctiva/sclera: Conjunctivae normal.  Neck:     Thyroid : No thyromegaly.     Vascular: No carotid bruit.  Cardiovascular:     Rate and Rhythm: Normal rate and regular rhythm.     Heart sounds: Normal heart sounds. No  murmur heard.    No gallop.  Pulmonary:     Effort: No accessory muscle usage or respiratory distress.     Breath  sounds: Normal breath sounds.  Abdominal:     General: Bowel sounds are normal. There is no distension.     Palpations: Abdomen is soft.     Tenderness: There is no abdominal tenderness.  Musculoskeletal:     Cervical back: Full passive range of motion without pain.     Right lower leg: No edema.     Left lower leg: No edema.  Lymphadenopathy:     Cervical: No cervical adenopathy.  Skin:    General: Skin is warm.     Capillary Refill: Capillary refill takes less than 2 seconds.  Neurological:     Mental Status: He is alert and oriented to person, place, and time.     Deep Tendon Reflexes: Reflexes are normal and symmetric.     Reflex Scores:      Brachioradialis reflexes are 2+ on the right side and 2+ on the left side.      Patellar reflexes are 2+ on the right side and 2+ on the left side. Psychiatric:        Attention and Perception: Attention normal.        Mood and Affect: Mood normal.        Speech: Speech normal.        Behavior: Behavior normal. Behavior is cooperative.        Thought Content: Thought content normal.    Results for orders placed or performed in visit on 01/25/24  CUP PACEART REMOTE DEVICE CHECK   Collection Time: 01/25/24  1:23 AM  Result Value Ref Range   Date Time Interrogation Session [phone]    Pulse Generator Manufacturer MERM    Pulse Gen Model [id number] Claria MRI Quad CRT-D    Pulse Gen Serial Number [id number]    Clinic Name [facility]    Implantable Pulse Generator Type Cardiac Resynch Therapy Defibulator    Implantable Pulse Generator Implant Date 79819685    Implantable Lead Manufacturer [facility]    Implantable Lead Model 1458Q Quartet    Implantable Lead Serial Number [id number]    Implantable Lead Implant Date 79819685    Implantable Lead Location Detail 1 UNKNOWN    Implantable Lead Location [id number]    Implantable  Lead Connection Status [id number]    Implantable Lead Manufacturer [facility]    Implantable Lead Model [id number] Sprint Quattro Secure    Implantable Lead Serial Number [id number]    Implantable Lead Implant Date 79879888    Implantable Lead Location [id number]    Implantable Lead Connection Status [id number]    Implantable Lead Manufacturer MERM    Implantable Lead Model 5076 CapSureFix Novus    Implantable Lead Serial Number [id number]    Implantable Lead Implant Date 79939494    Implantable Lead Location [id number]    Implantable Lead Connection Status [id number]    Lead Channel Setting Sensing Sensitivity 0.3 mV   Lead Channel Setting Pacing Amplitude 1.25 V   Lead Channel Setting Pacing Pulse Width 0.4 ms   Lead Channel Setting Pacing Amplitude 1.5 V   Lead Channel Setting Pacing Pulse Width 0.4 ms   Lead Channel Setting Pacing Amplitude 2 V   Lead Channel Setting Pacing Capture Mode Monitor Capture    Zone Setting Status Active    Zone Setting Status Inactive    Zone Setting Status Inactive    Zone Setting Status 755011    Zone Setting Status [phone]    Lead Channel Impedance Value 418 ohm   Lead Channel Sensing Intrinsic Amplitude 1.375 mV  Lead Channel Sensing Intrinsic Amplitude 1.375 mV   Lead Channel Pacing Threshold Amplitude 0.625 V   Lead Channel Pacing Threshold Pulse Width 0.4 ms   Lead Channel Impedance Value 551 ohm   Lead Channel Impedance Value 513 ohm   Lead Channel Sensing Intrinsic Amplitude 19.625 mV   Lead Channel Sensing Intrinsic Amplitude 19.625 mV   Lead Channel Pacing Threshold Amplitude 0.625 V   Lead Channel Pacing Threshold Pulse Width 0.4 ms   HighPow Impedance 49 ohm   HighPow Impedance 65 ohm   Lead Channel Impedance Value 779 ohm   Lead Channel Impedance Value 836 ohm   Lead Channel Impedance Value 931 ohm   Lead Channel Impedance Value 703 ohm   Lead Channel Impedance Value 817 ohm   Lead Channel Impedance Value 874 ohm   Lead Channel Impedance Value 513 ohm    Lead Channel Impedance Value 418 ohm   Lead Channel Impedance Value 456 ohm   Lead Channel Impedance Value 532 ohm   Lead Channel Impedance Value 230.327    Lead Channel Impedance Value 241.412    Lead Channel Impedance Value 261.164    Lead Channel Impedance Value 234.08 ohm   Lead Channel Impedance Value 245.538    Lead Channel Pacing Threshold Amplitude 1 V   Lead Channel Pacing Threshold Pulse Width 0.4 ms   Battery Status OK    Battery Remaining Longevity 6 mo   Battery Voltage 2.85 V   Brady Statistic RA Percent Paced 4.63 %   Brady Statistic RV Percent Paced 99.91 %   Brady Statistic AP VP Percent 4.64 %   Brady Statistic AS VP Percent 95.33 %   Brady Statistic AP VS Percent 0.01 %   Brady Statistic AS VS Percent 0.02 %      Assessment & Plan:   Problem List Items Addressed This Visit       Cardiovascular and Mediastinum   Type 2 diabetes mellitus with cardiac complication (HCC) - Primary   Chronic, ongoing CKD 3a.  A1c 6.9%, trend down, urine ALB 09 June 2023.  Rybelsus  & Metformin  caused GI issues.  We discussed and could consider trial of GLP1 injectable in future if needed, which he would be willing to try.  If poor tolerance then consider Glipizide .  Maintain current regimen at this time. - Continue current medication regimen with Farxiga  and Januvia  100 MG + continue collaboration with cardiology.   - Recommend he check BS at home at least a few mornings a week with goal fasting <130, bring to next visit.   - Continue to collaborate with PharmD on assistance programs - Up to date on eye and foot exam - Taking ARB, no statin (per cardiology this was stopped in past) - takes Zetia . - Refuses vaccinations. - Will have return in 6 months for physical       Relevant Medications   carvedilol  (COREG ) 12.5 MG tablet   ezetimibe  (ZETIA ) 10 MG tablet   Other Relevant Orders   Bayer DCA Hb A1c Waived   Systolic CHF (HCC)   Chronic, stable.  Euvolemic today.  Continue  current medication regimen and collaboration with cardiology.  LABS: CMP.   Recommend: - Reminded to call for an overnight weight gain of >2 pounds or a weekly weight weight of >5 pounds - not adding salt to his food and has been reading food labels. Reviewed the importance of keeping daily sodium intake to 2000mg  daily  - Avoid NSAIDS  Relevant Medications   carvedilol  (COREG ) 12.5 MG tablet   ezetimibe  (ZETIA ) 10 MG tablet   Idiopathic cardiomyopathy (HCC)   Ongoing, stable, followed by cardiology.  Continue this collaboration.      Relevant Medications   carvedilol  (COREG ) 12.5 MG tablet   ezetimibe  (ZETIA ) 10 MG tablet   Hypertension associated with diabetes (HCC)   Chronic, stable.  BP at goal in office today.  Continue current medication regimen and collaboration with cardiology.  Recommend checking BP at home at least a few mornings a week + focus on DASH diet.  LABS: CMP.  Urine ALB 09 June 2023.  Return in 3 months.      Relevant Medications   carvedilol  (COREG ) 12.5 MG tablet   ezetimibe  (ZETIA ) 10 MG tablet   Other Relevant Orders   Bayer DCA Hb A1c Waived   Comprehensive metabolic panel with GFR     Endocrine   Hypothyroidism   Chronic, ongoing.  Continue current medication regimen and adjust as needed based on labs.  TSH + Free T4 at physical in April 2026.      Relevant Medications   carvedilol  (COREG ) 12.5 MG tablet   Hyperlipidemia associated with type 2 diabetes mellitus (HCC)   Chronic, ongoing.  Tolerating Zetia , will continue this.   Would benefit from statin use with diabetes and heart health or consider injectable, discussed with him at visits.  Lipid panel today.        Relevant Medications   carvedilol  (COREG ) 12.5 MG tablet   ezetimibe  (ZETIA ) 10 MG tablet   Other Relevant Orders   Bayer DCA Hb A1c Waived   Comprehensive metabolic panel with GFR   Lipid Panel w/o Chol/HDL Ratio   Diabetes mellitus treated with oral medication (HCC)    Refer to diabetes with cardiac complication plan of care.      Relevant Orders   Bayer DCA Hb A1c Waived   CKD stage 3 due to type 2 diabetes mellitus (HCC)   Chronic, ongoing CKD 3a.  A1c 6.9%, trend down, urine ALB 09 June 2023.  Rybelsus  & Metformin  caused GI issues.  We discussed and could consider trial of GLP1 injectable in future if needed, which he would be willing to try.  If poor tolerance then consider Glipizide .  Maintain current regimen at this time. - Continue current medication regimen with Farxiga  and Januvia  100 MG + continue collaboration with cardiology.   - Recommend he check BS at home at least a few mornings a week with goal fasting <130, bring to next visit.   - Continue to collaborate with PharmD on assistance for medications. - Up to date on eye and foot exam - Taking ARB, no statin (per cardiology this was stopped in past) - takes Zetia . - Refuses vaccinations.      Relevant Medications   febuxostat  (ULORIC ) 40 MG tablet   Other Relevant Orders   Bayer DCA Hb A1c Waived     Musculoskeletal and Integument   Drug-induced myopathy   Due to statin therapy, history of on low doses.  Would benefit from statin use with diabetes and heart health or consider injectable, discussed with him today.  Continue Zetia  as is tolerating.        Other   No vaccination-pt refuse   Refuses all vaccinations and colonoscopy.      BMI 26.0-26.9,adult   BMI 26.30.  Recommended eating smaller high protein, low fat meals more frequently and exercising 30 mins a day 5 times a  week with a goal of 10-15lb weight loss in the next 3 months. Patient voiced their understanding and motivation to adhere to these recommendations.       Biventricular implantable cardioverter-defibrillator in situ   Continue collaboration with cardiology and regular checks.        Follow up plan: Return in about 6 months (around 08/20/2024) for Annual Physical -- after 08/17/24.

## 2024-02-20 NOTE — Assessment & Plan Note (Signed)
 BMI 26.30.  Recommended eating smaller high protein, low fat meals more frequently and exercising 30 mins a day 5 times a week with a goal of 10-15lb weight loss in the next 3 months. Patient voiced their understanding and motivation to adhere to these recommendations.

## 2024-02-20 NOTE — Assessment & Plan Note (Signed)
 Chronic, stable.  BP at goal in office today.  Continue current medication regimen and collaboration with cardiology.  Recommend checking BP at home at least a few mornings a week + focus on DASH diet.  LABS: CMP.  Urine ALB 09 June 2023.  Return in 3 months.

## 2024-02-20 NOTE — Assessment & Plan Note (Signed)
Continue collaboration with cardiology and regular checks. 

## 2024-02-20 NOTE — Assessment & Plan Note (Signed)
Refer to diabetes with cardiac complication plan of care. 

## 2024-02-20 NOTE — Assessment & Plan Note (Signed)
 Chronic, ongoing CKD 3a.  A1c 6.9%, trend down, urine ALB 09 June 2023.  Rybelsus  & Metformin  caused GI issues.  We discussed and could consider trial of GLP1 injectable in future if needed, which he would be willing to try.  If poor tolerance then consider Glipizide .  Maintain current regimen at this time. - Continue current medication regimen with Farxiga  and Januvia  100 MG + continue collaboration with cardiology.   - Recommend he check BS at home at least a few mornings a week with goal fasting <130, bring to next visit.   - Continue to collaborate with PharmD on assistance programs - Up to date on eye and foot exam - Taking ARB, no statin (per cardiology this was stopped in past) - takes Zetia . - Refuses vaccinations. - Will have return in 6 months for physical

## 2024-02-20 NOTE — Assessment & Plan Note (Signed)
Chronic, stable.  Euvolemic today.  Continue current medication regimen and collaboration with cardiology.  LABS: CMP.   Recommend: - Reminded to call for an overnight weight gain of >2 pounds or a weekly weight weight of >5 pounds - not adding salt to his food and has been reading food labels. Reviewed the importance of keeping daily sodium intake to 2000mg  daily  - Avoid NSAIDS

## 2024-02-20 NOTE — Assessment & Plan Note (Signed)
Refuses all vaccinations and colonoscopy.

## 2024-02-20 NOTE — Assessment & Plan Note (Signed)
 Due to statin therapy, history of on low doses.  Would benefit from statin use with diabetes and heart health or consider injectable, discussed with him today.  Continue Zetia as is tolerating.

## 2024-02-20 NOTE — Assessment & Plan Note (Signed)
 Chronic, ongoing CKD 3a.  A1c 6.9%, trend down, urine ALB 09 June 2023.  Rybelsus  & Metformin  caused GI issues.  We discussed and could consider trial of GLP1 injectable in future if needed, which he would be willing to try.  If poor tolerance then consider Glipizide .  Maintain current regimen at this time. - Continue current medication regimen with Farxiga  and Januvia  100 MG + continue collaboration with cardiology.   - Recommend he check BS at home at least a few mornings a week with goal fasting <130, bring to next visit.   - Continue to collaborate with PharmD on assistance for medications. - Up to date on eye and foot exam - Taking ARB, no statin (per cardiology this was stopped in past) - takes Zetia . - Refuses vaccinations.

## 2024-02-20 NOTE — Assessment & Plan Note (Signed)
 Chronic, ongoing.  Continue current medication regimen and adjust as needed based on labs.  TSH + Free T4 at physical in April 2026.

## 2024-02-20 NOTE — Assessment & Plan Note (Signed)
 Chronic, ongoing.  Tolerating Zetia, will continue this.   Would benefit from statin use with diabetes and heart health or consider injectable, discussed with him at visits.  Lipid panel today.

## 2024-02-20 NOTE — Assessment & Plan Note (Signed)
Ongoing, stable, followed by cardiology.  Continue this collaboration. 

## 2024-02-21 ENCOUNTER — Ambulatory Visit: Payer: Self-pay | Admitting: Nurse Practitioner

## 2024-02-21 LAB — LIPID PANEL W/O CHOL/HDL RATIO
Cholesterol, Total: 133 mg/dL (ref 100–199)
HDL: 30 mg/dL — ABNORMAL LOW (ref >39)
LDL Chol Calc (NIH): 58 mg/dL (ref 0–99)
Triglycerides: 283 mg/dL — ABNORMAL HIGH (ref 0–149)
VLDL Cholesterol Cal: 45 mg/dL — ABNORMAL HIGH (ref 5–40)

## 2024-02-21 LAB — COMPREHENSIVE METABOLIC PANEL WITH GFR
ALT: 38 IU/L (ref 0–44)
AST: 25 IU/L (ref 0–40)
Albumin: 4.4 g/dL (ref 3.8–4.8)
Alkaline Phosphatase: 44 IU/L — ABNORMAL LOW (ref 47–123)
BUN/Creatinine Ratio: 13 (ref 10–24)
BUN: 18 mg/dL (ref 8–27)
Bilirubin Total: 0.6 mg/dL (ref 0.0–1.2)
CO2: 21 mmol/L (ref 20–29)
Calcium: 9.2 mg/dL (ref 8.6–10.2)
Chloride: 107 mmol/L — ABNORMAL HIGH (ref 96–106)
Creatinine, Ser: 1.34 mg/dL — ABNORMAL HIGH (ref 0.76–1.27)
Globulin, Total: 2.1 g/dL (ref 1.5–4.5)
Glucose: 240 mg/dL — ABNORMAL HIGH (ref 70–99)
Potassium: 4.4 mmol/L (ref 3.5–5.2)
Sodium: 142 mmol/L (ref 134–144)
Total Protein: 6.5 g/dL (ref 6.0–8.5)
eGFR: 56 mL/min/1.73 — ABNORMAL LOW (ref >59)

## 2024-02-21 NOTE — Telephone Encounter (Signed)
 Requested Prescriptions  Refused Prescriptions Disp Refills   ezetimibe  (ZETIA ) 10 MG tablet [Pharmacy Med Name: EZETIMIBE  10 MG TABLET] 90 tablet 0    Sig: Take 1 tablet (10 mg total) by mouth daily.     Cardiovascular:  Antilipid - Sterol Transport Inhibitors Failed - 02/21/2024  4:58 PM      Failed - Lipid Panel in normal range within the last 12 months    Cholesterol, Total  Date Value Ref Range Status  02/20/2024 133 100 - 199 mg/dL Final   Cholesterol Piccolo, Waived  Date Value Ref Range Status  01/10/2019 134 <200 mg/dL Final    Comment:                            Desirable                <200                         Borderline High      200- 239                         High                     >239    LDL Chol Calc (NIH)  Date Value Ref Range Status  02/20/2024 58 0 - 99 mg/dL Final   HDL  Date Value Ref Range Status  02/20/2024 30 (L) >39 mg/dL Final   Triglycerides  Date Value Ref Range Status  02/20/2024 283 (H) 0 - 149 mg/dL Final   Triglycerides Piccolo,Waived  Date Value Ref Range Status  01/10/2019 260 (H) <150 mg/dL Final    Comment:                            Normal                   <150                         Borderline High     150 - 199                         High                200 - 499                         Very High                >499          Passed - AST in normal range and within 360 days    AST  Date Value Ref Range Status  02/20/2024 25 0 - 40 IU/L Final   AST (SGOT) Piccolo, Waived  Date Value Ref Range Status  01/10/2019 25 11 - 38 U/L Final         Passed - ALT in normal range and within 360 days    ALT  Date Value Ref Range Status  02/20/2024 38 0 - 44 IU/L Final   ALT (SGPT) Piccolo, Waived  Date Value Ref Range Status  01/10/2019 35 10 - 47 U/L Final         Passed - Patient is  not pregnant      Passed - Valid encounter within last 12 months    Recent Outpatient Visits           Yesterday Type 2 diabetes  mellitus with cardiac complication (HCC)   [facility] [place], Aurora T, NP   3 months ago Type 2 diabetes mellitus with cardiac complication (HCC)   [facility] [place], Melanie T, NP   6 months ago Type 2 diabetes mellitus with cardiac complication [facility])   [facility] Melanie DASEN, NP       Future Appointments             In 1 month Hester Alm BROCKS, MD [facility]

## 2024-02-21 NOTE — Progress Notes (Signed)
 Contacted via MyChart  Good afternoon Ryan [name], your labs have returned: - Kidney function continues to show Stage 3a kidney disease with no worsening.  Ensure good water intake daily. - Triglycerides remain elevated. Continue Zetia  and ensure eating lots of fish and chicken, try to avoid greasy foods.  In future you may benefit addition of an injection like Repatha or Praluent to help lower lipid panel levels a little more. Definitely something to discuss with new cardiologist too since you cannot take statins.  Any questions? Keep being amazing!!  Thank you for allowing me to participate in your care.  I appreciate you. Kindest regards, Amina Menchaca

## 2024-02-22 ENCOUNTER — Encounter

## 2024-02-27 ENCOUNTER — Encounter

## 2024-02-27 ENCOUNTER — Ambulatory Visit

## 2024-02-28 ENCOUNTER — Other Ambulatory Visit: Payer: Self-pay

## 2024-02-28 ENCOUNTER — Ambulatory Visit: Admitting: Emergency Medicine

## 2024-02-28 VITALS — Ht 72.5 in | Wt 196.0 lb

## 2024-02-28 DIAGNOSIS — I429 Cardiomyopathy, unspecified: Secondary | ICD-10-CM

## 2024-02-28 DIAGNOSIS — Z0000 Encounter for general adult medical examination without abnormal findings: Secondary | ICD-10-CM

## 2024-02-28 LAB — CUP PACEART REMOTE DEVICE CHECK
Battery Remaining Longevity: 6 mo
Battery Voltage: 2.85 V
Brady Statistic AP VP Percent: 1.78 %
Brady Statistic AP VS Percent: 0.01 %
Brady Statistic AS VP Percent: 98.21 %
Brady Statistic AS VS Percent: 0 %
Brady Statistic RA Percent Paced: 1.79 %
Brady Statistic RV Percent Paced: 99.98 %
Date Time Interrogation Session: 20251020044226
HighPow Impedance: 47 Ohm
HighPow Impedance: 59 Ohm
Implantable Lead Connection Status: 753985
Implantable Lead Connection Status: 753985
Implantable Lead Connection Status: 753985
Implantable Lead Implant Date: 20060505
Implantable Lead Implant Date: 20120111
Implantable Lead Implant Date: 20180314
Implantable Lead Location: 753858
Implantable Lead Location: 753859
Implantable Lead Location: 753860
Implantable Lead Model: 5076
Implantable Lead Model: 6947
Implantable Pulse Generator Implant Date: 20180314
Lead Channel Impedance Value: 230.327
Lead Channel Impedance Value: 230.327
Lead Channel Impedance Value: 237.686
Lead Channel Impedance Value: 237.686
Lead Channel Impedance Value: 265.661
Lead Channel Impedance Value: 399 Ohm
Lead Channel Impedance Value: 418 Ohm
Lead Channel Impedance Value: 418 Ohm
Lead Channel Impedance Value: 513 Ohm
Lead Channel Impedance Value: 513 Ohm
Lead Channel Impedance Value: 551 Ohm
Lead Channel Impedance Value: 551 Ohm
Lead Channel Impedance Value: 665 Ohm
Lead Channel Impedance Value: 779 Ohm
Lead Channel Impedance Value: 817 Ohm
Lead Channel Impedance Value: 836 Ohm
Lead Channel Impedance Value: 874 Ohm
Lead Channel Impedance Value: 950 Ohm
Lead Channel Pacing Threshold Amplitude: 0.5 V
Lead Channel Pacing Threshold Amplitude: 0.5 V
Lead Channel Pacing Threshold Amplitude: 1.375 V
Lead Channel Pacing Threshold Pulse Width: 0.4 ms
Lead Channel Pacing Threshold Pulse Width: 0.4 ms
Lead Channel Pacing Threshold Pulse Width: 0.4 ms
Lead Channel Sensing Intrinsic Amplitude: 1.125 mV
Lead Channel Sensing Intrinsic Amplitude: 1.125 mV
Lead Channel Sensing Intrinsic Amplitude: 19.625 mV
Lead Channel Sensing Intrinsic Amplitude: 19.625 mV
Lead Channel Setting Pacing Amplitude: 1.25 V
Lead Channel Setting Pacing Amplitude: 1.5 V
Lead Channel Setting Pacing Amplitude: 2 V
Lead Channel Setting Pacing Pulse Width: 0.4 ms
Lead Channel Setting Pacing Pulse Width: 0.4 ms
Lead Channel Setting Sensing Sensitivity: 0.3 mV
Zone Setting Status: 755011
Zone Setting Status: 755011

## 2024-02-28 NOTE — Patient Instructions (Addendum)
 Mr. Ryan [name],  Thank you for taking the time for your Medicare Wellness Visit. I appreciate your continued commitment to your health goals. Please review the care plan we discussed, and feel free to reach out if I can assist you further.  Medicare recommends these wellness visits once per year to help you and your care team stay ahead of potential health issues. These visits are designed to focus on prevention, allowing your provider to concentrate on managing your acute and chronic conditions during your regular appointments.  Please note that Annual Wellness Visits do not include a physical exam. Some assessments may be limited, especially if the visit was conducted virtually. If needed, we may recommend a separate in-person follow-up with your provider.  Ongoing Care Seeing your primary care provider every 3 to 6 months helps us  monitor your health and provide consistent, personalized care.   Referrals If a referral was made during today's visit and you haven't received any updates within two weeks, please contact the referred provider directly to check on the status.  Recommended Screenings:  Get a Tetanus vaccine at your convenience. Keep up the good work!   Health Maintenance  Topic Date Due   COVID-19 Vaccine (1) 03/05/2024*   Zoster (Shingles) Vaccine (1 of 2) 05/22/2024*   Flu Shot  08/07/2024*   Colon Cancer Screening  08/17/2024*   Pneumococcal Vaccine for age over 69 (2 of 2 - PCV) 02/17/2025*   Yearly kidney health urinalysis for diabetes  05/17/2024   Complete foot exam   08/17/2024   Hemoglobin A1C  08/20/2024   Eye exam for diabetics  11/15/2024   Yearly kidney function blood test for diabetes  02/19/2025   Medicare Annual Wellness Visit  02/27/2025   Hepatitis C Screening  Completed   Meningitis B Vaccine  Aged Out   DTaP/Tdap/Td vaccine  Discontinued  *Topic was postponed. The date shown is not the original due date.       02/28/2024    3:18 PM  Advanced  Directives  Does Patient Have a Medical Advance Directive? Yes  Type of Estate agent of [place];Living will  Does patient want to make changes to medical advance directive? No - Patient declined  Copy of Healthcare Power of Attorney in Chart? No - copy requested   Advance Care Planning is important because it: Ensures you receive medical care that aligns with your values, goals, and preferences. Provides guidance to your family and loved ones, reducing the emotional burden of decision-making during critical moments.  Vision: Annual vision screenings are recommended for early detection of glaucoma, cataracts, and diabetic retinopathy. These exams can also reveal signs of chronic conditions such as diabetes and high blood pressure.  Dental: Annual dental screenings help detect early signs of oral cancer, gum disease, and other conditions linked to overall health, including heart disease and diabetes.  Please see the attached documents for additional preventive care recommendations.    Fall Prevention in the Home, Adult Falls can cause injuries and affect people of all ages. There are many simple things that you can do to make your home safe and to help prevent falls. If you need it, ask for help making these changes. What actions can I take to prevent falls? General information Use good lighting in all rooms. Make sure to: Replace any light bulbs that burn out. Turn on lights if it is dark and use night-lights. Keep items that you use often in easy-to-reach places. Lower the shelves around your home  if needed. Move furniture so that there are clear paths around it. Do not keep throw rugs or other things on the floor that can make you trip. If any of your floors are uneven, fix them. Add color or contrast paint or tape to clearly mark and help you see: Grab bars or handrails. First and last steps of staircases. Where the edge of each step is. If you use a ladder  or stepladder: Make sure that it is fully opened. Do not climb a closed ladder. Make sure the sides of the ladder are locked in place. Have someone hold the ladder while you use it. Know where your pets are as you move through your home. What can I do in the bathroom?     Keep the floor dry. Clean up any water that is on the floor right away. Remove soap buildup in the bathtub or shower. Buildup makes bathtubs and showers slippery. Use non-skid mats or decals on the floor of the bathtub or shower. Attach bath mats securely with double-sided, non-slip rug tape. If you need to sit down while you are in the shower, use a non-slip stool. Install grab bars by the toilet and in the bathtub and shower. Do not use towel bars as grab bars. What can I do in the bedroom? Make sure that you have a light by your bed that is easy to reach. Do not use any sheets or blankets on your bed that hang to the floor. Have a firm bench or chair with side arms that you can use for support when you get dressed. What can I do in the kitchen? Clean up any spills right away. If you need to reach something above you, use a sturdy step stool that has a grab bar. Keep electrical cables out of the way. Do not use floor polish or wax that makes floors slippery. What can I do with my stairs? Do not leave anything on the stairs. Make sure that you have a light switch at the top and the bottom of the stairs. Have them installed if you do not have them. Make sure that there are handrails on both sides of the stairs. Fix handrails that are broken or loose. Make sure that handrails are as long as the staircases. Install non-slip stair treads on all stairs in your home if they do not have carpet. Avoid having throw rugs at the top or bottom of stairs, or secure the rugs with carpet tape to prevent them from moving. Choose a carpet design that does not hide the edge of steps on the stairs. Make sure that carpet is firmly  attached to the stairs. Fix any carpet that is loose or worn. What can I do on the outside of my home? Use bright outdoor lighting. Repair the edges of walkways and driveways and fix any cracks. Clear paths of anything that can make you trip, such as tools or rocks. Add color or contrast paint or tape to clearly mark and help you see high doorway thresholds. Trim any bushes or trees on the main path into your home. Check that handrails are securely fastened and in good repair. Both sides of all steps should have handrails. Install guardrails along the edges of any raised decks or porches. Have leaves, snow, and ice cleared regularly. Use sand, salt, or ice melt on walkways during winter months if you live where there is ice and snow. In the garage, clean up any spills right away, including grease  or oil spills. What other actions can I take? Review your medicines with your health care provider. Some medicines can make you confused or feel dizzy. This can increase your chance of falling. Wear closed-toe shoes that fit well and support your feet. Wear shoes that have rubber soles and low heels. Use a cane, walker, scooter, or crutches that help you move around if needed. Talk with your provider about other ways that you can decrease your risk of falls. This may include seeing a physical therapist to learn to do exercises to improve movement and strength. Where to find more information Centers for Disease Control and Prevention, STEADI: TonerPromos.no [employer] on Aging: BaseRingTones.pl National Institute on Aging: BaseRingTones.pl Contact a health care provider if: You are afraid of falling at home. You feel weak, drowsy, or dizzy at home. You fall at home. Get help right away if you: Lose consciousness or have trouble moving after a fall. Have a fall that causes a head injury. These symptoms may be an emergency. Get help right away. Call 911. Do not wait to see if the symptoms will go away. Do  not drive yourself to the hospital. This information is not intended to replace advice given to you by your health care provider. Make sure you discuss any questions you have with your health care provider. Document Revised: 12/28/2021 Document Reviewed: 12/28/2021 Elsevier Patient Education  2024 [employer].

## 2024-02-28 NOTE — Progress Notes (Unsigned)
   02/28/2024  Patient ID: Ryan [name], male   DOB: Sep 07, 1950, 73 y.o.   [name]: [id number]  Patient outreach to discuss upcoming changes to Entresto  PAP after patient received a letter stating this medication would no longer be provided since a generic will soon be available.  Entresto  copay assistance is available through [facility] for cardiomyopathy, which patient would qualify for.  Will enroll patient in this fund and have asked him to inform us  when he needs a refill on Entresto  sent to his pharmacy; and I will provide them with processing information to bill secondary to his insurance to make copay $0.  Patient is expecting another 90 day supply of Entresto  from PAP, which should arrive this week.    Ryan [name], PharmD, DPLA

## 2024-02-28 NOTE — Progress Notes (Signed)
 Subjective:   Ryan [name] is a 73 y.o. who presents for a Medicare Wellness preventive visit.  As a reminder, Annual Wellness Visits don't include a physical exam, and some assessments may be limited, especially if this visit is performed virtually. We may recommend an in-person follow-up visit with your provider if needed.  Visit Complete: Virtual I connected with  Ryan [name] on 02/28/24 by a audio enabled telemedicine application and verified that I am speaking with the correct person using two identifiers.  Patient Location: Home  Provider Location: Office/Clinic  I discussed the limitations of evaluation and management by telemedicine. The patient expressed understanding and agreed to proceed.  Vital Signs: Because this visit was a virtual/telehealth visit, some criteria may be missing or patient reported. Any vitals not documented were not able to be obtained and vitals that have been documented are patient reported.  VideoDeclined- This patient declined Librarian, academic. Therefore the visit was completed with audio only.  Persons Participating in Visit: Patient.  AWV Questionnaire: No: Patient Medicare AWV questionnaire was not completed prior to this visit.  Cardiac Risk Factors include: advanced age (>28men, >44 women);male gender;diabetes mellitus;dyslipidemia;hypertension     Objective:    Today's Vitals   02/28/24 1504  Weight: 196 lb (88.9 kg)  Height: 6' 0.5 (1.842 m)   Body mass index is 26.22 kg/m.     02/28/2024    3:18 PM 02/22/2023    1:00 PM 02/04/2021    5:13 PM 01/17/2019   11:21 AM 07/05/2017   10:50 AM 04/25/2017    8:40 AM 07/21/2016   10:43 AM  Advanced Directives  Does Patient Have a Medical Advance Directive? Yes Yes Yes Yes Yes  Yes  Yes   Type of Estate agent of [place];Living will Healthcare Power of [place];Living will Living will Living will;Healthcare Power of [employer]  Power of [place];Living will Healthcare Power of [place];Living will Healthcare Power of [place];Living will  Does patient want to make changes to medical advance directive? No - Patient declined No - Patient declined No - Patient declined  No - Patient declined   No - Patient declined   Copy of Healthcare Power of Attorney in Chart? No - copy requested No - copy requested  No - copy requested No - copy requested  No - copy requested  No - copy requested      Data saved with a previous flowsheet row definition    Current Medications (verified) Outpatient Encounter Medications as of 02/28/2024  Medication Sig   carvedilol  (COREG ) 12.5 MG tablet Take 1 tablet (12.5 mg total) by mouth 2 (two) times daily.   dexlansoprazole  (DEXILANT ) 60 MG capsule Take 1 capsule (60 mg total) by mouth daily.   ezetimibe  (ZETIA ) 10 MG tablet Take 1 tablet (10 mg total) by mouth daily.   FARXIGA  10 MG TABS tablet Take 10 mg by mouth daily.   febuxostat  (ULORIC ) 40 MG tablet Take 1 tablet (40 mg total) by mouth daily.   JANUVIA  100 MG tablet TAKE ONE TABLET BY MOUTH EVERY DAY   levothyroxine  (SYNTHROID ) 50 MCG tablet Take 1 tablet (50 mcg total) by mouth daily.   sacubitril -valsartan  (ENTRESTO ) 24-26 MG Take 1 tablet by mouth 2 (two) times daily.   spironolactone  (ALDACTONE ) 25 MG tablet Take 0.5 tablets (12.5 mg total) by mouth daily.   No facility-administered encounter medications on file as of 02/28/2024.    Allergies (verified) Ace inhibitors and Crestor [rosuvastatin calcium]  History: Past Medical History:  Diagnosis Date   AICD (automatic cardioverter/defibrillator) present    Anxiety    Complete heart block (HCC)    Depression    Diabetes mellitus, type 2 (HCC)    Dual ICD (implantable cardiac defibrillator-Medtronic    a. 6949 implanted originally s/p [phone] lead & generator change out 05/2010; b. upgraded to MDT CRT-D in 07/2016   GERD (gastroesophageal reflux disease)    Gout    History of  kidney stones    Hypertension    IBS (irritable bowel syndrome)    Kidney stones    Nonischemic cardiomyopathy (HCC)    a. Echo 07/2012 EF 35-40%, severe HK of the mid-distalanterior myocardium w/ AK of the distalinferior myocardium, GR1DD, mild MR, mild to mod dilated LA; b/ echo 06/2016 EF 35-40%, severe HK of the mid-apicalanteroseptal and anterior myocardium and severe HK of the apicalinferior myocardium along with AK of the apical myocardium, GR1DD, calcified mitral annulus, mildly dilated left atrium   Paroxysmal atrial fibrillation (HCC)    a. CHADS2VASc => 3 (CHF, HTN, age x 1); b. on ASA only   Presence of permanent cardiac pacemaker    Skin cancer    chest x 2, treated by Dr Jakie in the past.   Syncope    Past Surgical History:  Procedure Laterality Date   BIV UPGRADE N/A 07/21/2016   Procedure: BiV Upgrade;  Surgeon: Elspeth JAYSON Sage, MD;  Location: [facility] INVASIVE CV LAB;  Service: Cardiovascular;  Laterality: N/A;   CARDIAC DEFIBRILLATOR PLACEMENT     DIRECT LARYNGOSCOPY Right 07/14/2017   Procedure: MICRODIRECT LARYNGOSCOPY WITH EXCISION OF RIGHT VOCAL CORD LESION WITH MICRO FLAP TECHNIQUE;  Surgeon: Milissa Hamming, MD;  Location: [facility] ORS;  Service: ENT;  Laterality: Right;   LARYNGOSCOPY Right 01/06/2017   Procedure: LARYNGOSCOPY WITH EXCISION OF RIGHT VOCAL CORD LESION;  Surgeon: Milissa Hamming, MD;  Location: [facility] ORS;  Service: ENT;  Laterality: Right;   LITHOTRIPSY     UPPER ENDOSCOPY W/ ESOPHAGEAL MANOMETRY     Family History  Problem Relation Age of Onset   Breast cancer Mother    Diabetes Mother    Heart disease Mother    Cancer Mother        breast   Lung cancer Father    Diabetes Father    Cancer Father        lung   Diabetes Sister    Social History   Socioeconomic History   Marital status: Married    Spouse name: [name]   Number of children: 1   Years of education: 12   Highest education level: Associate degree: occupational, Scientist, product/process development, or  vocational program  Occupational History   Occupation: Self Employed    Employer: [employer]   Occupation: retired  Tobacco Use   Smoking status: Former    Current packs/day: 0.00    Average packs/day: 0.3 packs/day for 0.6 years (0.2 ttl pk-yrs)    Types: Cigarettes    Start date: 11    Quit date: 12/30/1989    Years since quitting: 34.1   Smokeless tobacco: Never  Vaping Use   Vaping status: Never Used  Substance and Sexual Activity   Alcohol use: Yes    Alcohol/week: 7.0 standard drinks of alcohol    Types: 7 Cans of beer per week    Comment: 1 beer every day   Drug use: No   Sexual activity: Yes  Other Topics Concern   Not on file  Social  History Narrative   Not on file   Social Drivers of Health   Financial Resource Strain: Low Risk  (02/28/2024)   Overall Financial Resource Strain (CARDIA)    Difficulty of Paying Living Expenses: Not hard at all  Food Insecurity: No Food Insecurity (02/28/2024)   Hunger Vital Sign    Worried About Running Out of Food in the Last Year: Never true    Ran Out of Food in the Last Year: Never true  Transportation Needs: No Transportation Needs (02/28/2024)   PRAPARE - Administrator, Civil Service (Medical): No    Lack of Transportation (Non-Medical): No  Physical Activity: Insufficiently Active (02/28/2024)   Exercise Vital Sign    Days of Exercise per Week: 7 days    Minutes of Exercise per Session: 20 min  Stress: No Stress Concern Present (02/28/2024)   [employer] of Occupational Health - Occupational Stress Questionnaire    Feeling of Stress: Not at all  Social Connections: Moderately Integrated (02/28/2024)   Social Connection and Isolation Panel    Frequency of Communication with Friends and Family: More than three times a week    Frequency of Social Gatherings with Friends and Family: More than three times a week    Attends Religious Services: Never    Database administrator or Organizations:  Yes    Attends Engineer, structural: More than 4 times per year    Marital Status: Married    Tobacco Counseling Counseling given: Not Answered    Clinical Intake:  Pre-visit preparation completed: Yes  Pain : No/denies pain     BMI - recorded: 26.22 Nutritional Status: BMI 25 -29 Overweight Nutritional Risks: None Diabetes: Yes CBG done?: No Did pt. bring in CBG monitor from home?: No  Lab Results  Component Value Date   HGBA1C 6.9 (H) 02/20/2024   HGBA1C 7.0 (H) 11/18/2023   HGBA1C 7.9 (H) 08/18/2023     How often do you need to have someone help you when you read instructions, pamphlets, or other written materials from your doctor or pharmacy?: 1 - Never  Interpreter Needed?: No  Information entered by :: Vina Ned, CMA   Activities of Daily Living     02/28/2024    3:09 PM 08/18/2023    8:12 AM  In your present state of health, do you have any difficulty performing the following activities:  Hearing? 0 0  Vision? 0 0  Difficulty concentrating or making decisions? 0 0  Walking or climbing stairs? 0 0  Dressing or bathing? 0 0  Doing errands, shopping? 0 0  Preparing Food and eating ? N   Using the Toilet? N   In the past six months, have you accidently leaked urine? N   Do you have problems with loss of bowel control? N   Managing your Medications? N   Managing your Finances? N   Housekeeping or managing your Housekeeping? N     Patient Care Team: Cannady, [name] T, NP as PCP - General (Nurse Practitioner) Hester Alm BROCKS, MD (Dermatology) Cindie Ole DASEN, MD as Consulting Physician (Cardiology) Mevelyn JONETTA Bathe, OD (Optometry) Deanna Channing LABOR, [facility] (Pharmacist)  I have updated your Care Teams any recent Medical Services you may have received from other providers in the past year.     Assessment:   This is a routine wellness examination for Ryan [name].  Hearing/Vision screen Hearing Screening - Comments:: Denies hearing  loss  Vision Screening - Comments:: Gets DM  eye exam, Dr. Robynn [place], [place]   Goals Addressed             This Visit's Progress    Patient Stated       Would like A1C to be 6.5, triglycerides to be 150 and continue to stay active       Depression Screen     02/28/2024    3:15 PM 02/20/2024   10:15 AM 11/18/2023   10:14 AM 05/18/2023   10:17 AM 02/22/2023   12:56 PM 02/09/2023    9:35 AM 11/09/2022    8:33 AM  PHQ 2/9 Scores  PHQ - 2 Score 0 0 0 0 0 0 0  PHQ- 9 Score 0 0 0 0 0 0 0    Fall Risk     02/28/2024    3:19 PM 02/20/2024   10:15 AM 11/18/2023   10:14 AM 05/18/2023   10:16 AM 05/18/2023   10:15 AM  Fall Risk   Falls in the past year? 0 0 0 0 0  Number falls in past yr: 0 0 0 0 0  Injury with Fall? 0 0 0 0 0  Risk for fall due to : No Fall Risks No Fall Risks No Fall Risks No Fall Risks No Fall Risks  Follow up Falls evaluation completed Falls evaluation completed Falls evaluation completed Falls evaluation completed Falls evaluation completed    MEDICARE RISK AT HOME:  Medicare Risk at Home Any stairs in or around the home?: Yes If so, are there any without handrails?: No Home free of loose throw rugs in walkways, pet beds, electrical cords, etc?: Yes Adequate lighting in your home to reduce risk of falls?: Yes Life alert?: No Use of a cane, walker or w/c?: No Grab bars in the bathroom?: No Shower chair or bench in shower?: No Elevated toilet seat or a handicapped toilet?: Yes  TIMED UP AND GO:  Was the test performed?  No  Cognitive Function: 6CIT completed        02/28/2024    3:21 PM 02/22/2023    1:03 PM 02/05/2022   10:12 AM 02/04/2021    5:13 PM 05/29/2019    8:53 AM  6CIT Screen  What Year? 0 points 0 points 0 points 0 points 0 points  What month? 0 points 0 points 0 points 0 points 0 points  What time? 3 points 0 points 0 points 0 points 0 points  Count back from 20 0 points 0 points 0 points 0 points 0 points  Months in reverse 0  points 0 points 0 points 0 points 0 points  Repeat phrase 0 points 0 points 0 points 0 points 0 points  Total Score 3 points 0 points 0 points 0 points 0 points    Immunizations Immunization History  Administered Date(s) Administered   Pneumococcal Polysaccharide-23 04/21/2005   Td 06/10/1996, 05/20/2010   Zoster, Live 11/04/2011    Screening Tests Health Maintenance  Topic Date Due   COVID-19 Vaccine (1) 03/05/2024 (Originally 10/22/1955)   Zoster Vaccines- Shingrix (1 of 2) 05/22/2024 (Originally 10/21/1969)   Influenza Vaccine  08/07/2024 (Originally 12/09/2023)   Colonoscopy  08/17/2024 (Originally 10/22/1995)   Pneumococcal Vaccine: 50+ Years (2 of 2 - PCV) 02/17/2025 (Originally 04/21/2006)   Diabetic kidney evaluation - Urine ACR  05/17/2024   FOOT EXAM  08/17/2024   HEMOGLOBIN A1C  08/20/2024   OPHTHALMOLOGY EXAM  11/15/2024   Diabetic kidney evaluation - eGFR measurement  02/19/2025   Medicare  Annual Wellness (AWV)  02/27/2025   Hepatitis C Screening  Completed   Meningococcal B Vaccine  Aged Out   DTaP/Tdap/Td  Discontinued    Health Maintenance Items Addressed: Vaccines Due: Tdap (will get when battery is replaced in Pacemaker/Defib, See Nurse Notes at the end of this note  Additional Screening:  Vision Screening: Recommended annual ophthalmology exams for early detection of glaucoma and other disorders of the eye. Is the patient up to date with their annual eye exam?  Yes  Who is the provider or what is the name of the office in which the patient attends annual eye exams? Dr. Robynn Mevelyn Molly   Dental Screening: Recommended annual dental exams for proper oral hygiene  Community Resource Referral / Chronic Care Management: CRR required this visit?  No   CCM required this visit?  No   Plan:    I have personally reviewed and noted the following in the patient's chart:   Medical and social history Use of alcohol, tobacco or illicit drugs  Current  medications and supplements including opioid prescriptions. Patient is not currently taking opioid prescriptions. Functional ability and status Nutritional status Physical activity Advanced directives List of other physicians Hospitalizations, surgeries, and ER visits in previous 12 months Vitals Screenings to include cognitive, depression, and falls Referrals and appointments  In addition, I have reviewed and discussed with patient certain preventive protocols, quality metrics, and best practice recommendations. A written personalized care plan for preventive services as well as general preventive health recommendations were provided to patient.   Vina Ned, CMA   02/28/2024   After Visit Summary: (MyChart) Due to this being a telephonic visit, the after visit summary with patients personalized plan was offered to patient via MyChart   Notes:  6 CIT Score - 3 Needs Tdap (will be getting with battery change on pacemaker/defib later this year) Declined DM & Nutrition education referral Declined pneumonia, covid, flu and shingles vaccines Declined colonoscopy

## 2024-03-26 ENCOUNTER — Encounter

## 2024-03-27 ENCOUNTER — Telehealth: Payer: Self-pay

## 2024-03-27 NOTE — Telephone Encounter (Signed)
 Gave pt a call pt is coming up due for re-enrollment on [employer] (Januvia ) and AZ&ME Farxiga , spoke with pt pt will come in to office on 11/19 to sign in paper work.

## 2024-03-29 ENCOUNTER — Encounter

## 2024-03-29 ENCOUNTER — Ambulatory Visit: Attending: Cardiology

## 2024-03-29 LAB — CUP PACEART REMOTE DEVICE CHECK
Battery Remaining Longevity: 5 mo
Battery Voltage: 2.85 V
Brady Statistic AP VP Percent: 1.37 %
Brady Statistic AP VS Percent: 0.01 %
Brady Statistic AS VP Percent: 98.62 %
Brady Statistic AS VS Percent: 0 %
Brady Statistic RA Percent Paced: 1.38 %
Brady Statistic RV Percent Paced: 99.98 %
Date Time Interrogation Session: 20251120042405
HighPow Impedance: 46 Ohm
HighPow Impedance: 59 Ohm
Implantable Lead Connection Status: 753985
Implantable Lead Connection Status: 753985
Implantable Lead Connection Status: 753985
Implantable Lead Implant Date: 20060505
Implantable Lead Implant Date: 20120111
Implantable Lead Implant Date: 20180314
Implantable Lead Location: 753858
Implantable Lead Location: 753859
Implantable Lead Location: 753860
Implantable Lead Model: 5076
Implantable Lead Model: 6947
Implantable Pulse Generator Implant Date: 20180314
Lead Channel Impedance Value: 1007 Ohm
Lead Channel Impedance Value: 1083 Ohm
Lead Channel Impedance Value: 245.538
Lead Channel Impedance Value: 245.538
Lead Channel Impedance Value: 270.508
Lead Channel Impedance Value: 270.508
Lead Channel Impedance Value: 295.556
Lead Channel Impedance Value: 399 Ohm
Lead Channel Impedance Value: 456 Ohm
Lead Channel Impedance Value: 456 Ohm
Lead Channel Impedance Value: 513 Ohm
Lead Channel Impedance Value: 532 Ohm
Lead Channel Impedance Value: 589 Ohm
Lead Channel Impedance Value: 665 Ohm
Lead Channel Impedance Value: 703 Ohm
Lead Channel Impedance Value: 836 Ohm
Lead Channel Impedance Value: 836 Ohm
Lead Channel Impedance Value: 931 Ohm
Lead Channel Pacing Threshold Amplitude: 0.625 V
Lead Channel Pacing Threshold Amplitude: 0.625 V
Lead Channel Pacing Threshold Amplitude: 1 V
Lead Channel Pacing Threshold Pulse Width: 0.4 ms
Lead Channel Pacing Threshold Pulse Width: 0.4 ms
Lead Channel Pacing Threshold Pulse Width: 0.4 ms
Lead Channel Sensing Intrinsic Amplitude: 0.875 mV
Lead Channel Sensing Intrinsic Amplitude: 0.875 mV
Lead Channel Sensing Intrinsic Amplitude: 19.625 mV
Lead Channel Sensing Intrinsic Amplitude: 19.625 mV
Lead Channel Setting Pacing Amplitude: 1.25 V
Lead Channel Setting Pacing Amplitude: 1.5 V
Lead Channel Setting Pacing Amplitude: 2 V
Lead Channel Setting Pacing Pulse Width: 0.4 ms
Lead Channel Setting Pacing Pulse Width: 0.4 ms
Lead Channel Setting Sensing Sensitivity: 0.3 mV
Zone Setting Status: 755011
Zone Setting Status: 755011

## 2024-04-01 ENCOUNTER — Ambulatory Visit: Payer: Self-pay | Admitting: Cardiology

## 2024-04-08 NOTE — Progress Notes (Deleted)
 Electrophysiology Clinic Note    Date:  04/08/2024  Patient ID:  Kyi, Romanello [birth year], [name] [id number] PCP:  Valerio Melanie DASEN, NP  Cardiologist:  None  Electrophysiologist:  Fonda Kitty, MD  Electrophysiology APP:  Rhaya Coale, NP    ***refresh  Discussed the use of AI scribe software for clinical note transcription with the patient, who gave verbal consent to proceed.   Patient Profile    Chief Complaint: routine CRT-D follow-up  History of Present Illness: Ryan [name] is a 73 y.o. male with PMH notable for NICM, HFmrEF, s/p CRT-D, parox AFib, HTN, T2DM ; seen today for Fonda Kitty, MD (formerly Dr. Fernande) for routine electrophysiology followup.   Originally had ICD placement d/t NICM, then developed CHB, and had CRT upgrade 2018. I last saw him 02/2023 where he was doing well without activity limitations or edema.   On follow-up today, *** fluid status, edema?  - full device check  - update TTE? - gen cards referral?   Since last being seen in our clinic the patient reports doing ***.  he denies chest pain, palpitations, dyspnea, PND, orthopnea, nausea, vomiting, dizziness, syncope, edema, weight gain, or early satiety.      Arrhythmia/Device History MDT CRT-D, imp 2006 device upgrade 2018; dx CHF, LBBB - St. Jude LV lead     ROS:  Please see the history of present illness. All other systems are reviewed and otherwise negative.    Physical Exam    VS:  There were no vitals taken for this visit. BMI: There is no height or weight on file to calculate BMI.           Wt Readings from Last 3 Encounters:  02/28/24 196 lb (88.9 kg)  02/20/24 196 lb 9.6 oz (89.2 kg)  11/18/23 195 lb (88.5 kg)     GEN- The patient is well appearing, alert and oriented x 3 today.   Lungs- Clear to ausculation bilaterally, normal work of breathing.  Heart- {Blank single:19197::Regular,Irregularly irregular} rate and rhythm, no murmurs, rubs or  gallops Extremities- {EDEMA LEVEL:28147::No} peripheral edema, warm, dry Skin-  *** device pocket well-healed, no tethering     Device interrogation done today and reviewed by myself:  Battery *** Lead thresholds, impedence, sensing stable *** *** episodes *** changes made today   Studies Reviewed   Previous EP, cardiology notes.    EKG is ordered. Personal review of EKG from today shows:  ***        TTE, 07/27/2017 - Left ventricle: The cavity size was normal. Systolic function was mildly to moderately reduced. The estimated ejection fraction was in the range of 40% to 45%. Hypokinesis of the basal to mid inferior and inferolateral myocardium (not well visualized). Hypokinesis of the apical and anteroapical myocardium. Doppler parameters are consistent with abnormal left ventricular relaxation (grade 1 diastolic dysfunction).  - Left atrium: The atrium was normal in size.  - Right ventricle: Pacer wire or catheter noted in right ventricle. Systolic function was normal.  - Pulmonary arteries: Systolic pressure was within the normal range.      Assessment and Plan     #) NICM, LBBB s/p CRT-D   #) HFmrEF   #) subclinical AFib   {Are you ordering a CV Procedure (e.g. stress test, cath, DCCV, TEE, etc)?   Press F2        :[id number]}   Current medicines are reviewed at length with the patient today.   The patient {  ACTIONS; HAS/DOES NOT HAVE:19233} concerns regarding his medicines.  The following changes were made today:  {NONE DEFAULTED:18576}  Labs/ tests ordered today include: *** No orders of the defined types were placed in this encounter.    Disposition: Follow up with {EPMDS:28135::EP Team} or EP APP {EPFOLLOW UP:28173}   Signed, Marycarmen Hagey, NP  04/08/24  4:54 PM  Electrophysiology [facility]

## 2024-04-09 ENCOUNTER — Ambulatory Visit: Admitting: Cardiology

## 2024-04-09 DIAGNOSIS — Z95810 Presence of automatic (implantable) cardiac defibrillator: Secondary | ICD-10-CM

## 2024-04-09 DIAGNOSIS — I5022 Chronic systolic (congestive) heart failure: Secondary | ICD-10-CM

## 2024-04-09 DIAGNOSIS — I428 Other cardiomyopathies: Secondary | ICD-10-CM

## 2024-04-11 ENCOUNTER — Other Ambulatory Visit: Payer: Self-pay | Admitting: Nurse Practitioner

## 2024-04-11 DIAGNOSIS — N183 Chronic kidney disease, stage 3 unspecified: Secondary | ICD-10-CM

## 2024-04-12 ENCOUNTER — Ambulatory Visit: Payer: Medicare Other | Admitting: Dermatology

## 2024-04-14 NOTE — Progress Notes (Unsigned)
 Electrophysiology Clinic Note    Date:  04/16/2024  Patient ID:  Ryan [name], Ryan [name] 07-12-1950, [name] [id number] PCP:  Valerio Melanie DASEN, NP  Cardiologist:  None  Electrophysiologist:  Fonda Kitty, MD  Electrophysiology APP:  Banks Chaikin, NP     Discussed the use of AI scribe software for clinical note transcription with the patient, who gave verbal consent to proceed.   Patient Profile    Chief Complaint: routine CRT-D follow-up  History of Present Illness: Ryan [name] is a 73 y.o. male with PMH notable for NICM, HFmrEF, s/p CRT-D, parox AFib, HTN, T2DM ; seen today for Fonda Kitty, MD (formerly Dr. Fernande) for routine electrophysiology followup.   Originally had ICD placement d/t NICM, then developed CHB, and had CRT upgrade 2018. I last saw him 02/2023 where he was doing well without activity limitations or edema.   On follow-up today, he is doing well from a cardiac standpoint. He remains active caring for his large property that needs fences mended, bush-hogged (no longer has cows, only 2 donkeys). He denies chest pain, chest pressure, palpitations, or edema.       Arrhythmia/Device History MDT CRT-D, imp 2006 device upgrade 2018; dx CHF, LBBB - St. Jude LV lead     ROS:  Please see the history of present illness. All other systems are reviewed and otherwise negative.    Physical Exam    VS:  BP 138/72 (BP Location: Left Arm, Patient Position: Sitting, Cuff Size: Normal)   Pulse 68   Ht 6' 0.5 (1.842 m)   Wt 196 lb 12.8 oz (89.3 kg)   SpO2 97%   BMI 26.32 kg/m  BMI: Body mass index is 26.32 kg/m.           Wt Readings from Last 3 Encounters:  04/16/24 196 lb 12.8 oz (89.3 kg)  02/28/24 196 lb (88.9 kg)  02/20/24 196 lb 9.6 oz (89.2 kg)     GEN- The patient is well appearing, alert and oriented x 3 today.   Lungs- Clear to ausculation bilaterally, normal work of breathing.  Heart- Regular rate and rhythm, no murmurs, rubs or  gallops Extremities- No peripheral edema, warm, dry Skin-  device pocket well-healed, no tethering   Device interrogation done today and reviewed by myself:  Battery 4 months Lead thresholds, impedence, sensing stable  No episodes No changes made today   Studies Reviewed   Previous EP, cardiology notes.    EKG is ordered. Personal review of EKG from today shows:    EKG Interpretation Date/Time:  Monday April 16 2024 09:24:18 EST Ventricular Rate:  68 PR Interval:  150 QRS Duration:  170 QT Interval:  476 QTC Calculation: 506 R Axis:   256  Text Interpretation: Atrial-sensed ventricular-paced rhythm Confirmed by Daxen Lanum [phone]) on 04/16/2024 10:04:40 AM    TTE, 07/27/2017 - Left ventricle: The cavity size was normal. Systolic function was mildly to moderately reduced. The estimated ejection fraction was in the range of 40% to 45%. Hypokinesis of the basal to mid inferior and inferolateral myocardium (not well visualized). Hypokinesis of the apical and anteroapical myocardium. Doppler parameters are consistent with abnormal left ventricular relaxation (grade 1 diastolic dysfunction).  - Left atrium: The atrium was normal in size.  - Right ventricle: Pacer wire or catheter noted in right ventricle. Systolic function was normal.  - Pulmonary arteries: Systolic pressure was within the normal range.    Assessment and Plan     #)  NICM, LBBB s/p CRT-D Device functioning well, see paceart for details Battery 4 months Lead measurements stable Dependent down to VVI 40 VP 100% We discussed generator change procedure. He is enrolled in monthly remotes to closely monitor battery Discussed he would need lab work within 1 month of gen change. He prefers to have gen change done at [facility], but is ok to go to [facility] if needed He does not feel strongly about meeting Dr. Kennyth prior to procedure  #) HFmrEF Appears euvolemic on exam with good exercise capacity Most recent TTE with LVEF  40-45% Recommend updated TTE GDMT includes 24-26 entresto , 12.5mg  coreg  BID, 10mg  farxiga , 12.5mg  spiro No diuretic indicated Will refer to general cardiology for further GDMT adjustments and to establish care   #) subclinical AFib No episodes on device interrogation Continue to monitor via PPM      Current medicines are reviewed at length with the patient today.   The patient does not have concerns regarding his medicines.  The following changes were made today:  none  Labs/ tests ordered today include:  Orders Placed This Encounter  Procedures   Ambulatory referral to Cardiology   EKG 12-Lead   ECHOCARDIOGRAM COMPLETE     Disposition: Follow up with Dr. Kennyth or EP APP in 6 months, sooner as needed based on PPM battery life Continue monthly remote monitoring    Signed, Chantal Needle, NP  04/16/24  12:36 PM  Electrophysiology [facility]

## 2024-04-14 NOTE — Telephone Encounter (Signed)
 Too soon for refill, LRF 02/20/24 for 90/3 RF.  Requested Prescriptions  Pending Prescriptions Disp Refills   febuxostat  (ULORIC ) 40 MG tablet [Pharmacy Med Name: FEBUXOSTAT  40 MG TABLET] 90 tablet 0    Sig: Take 1 tablet (40 mg total) by mouth daily.     Endocrinology: Gout Agents - febuxostat  Failed - 04/14/2024  7:37 AM      Failed - Uric Acid in normal range and within 360 days    Uric Acid  Date Value Ref Range Status  08/18/2023 3.2 (L) 3.8 - 8.4 mg/dL Final    Comment:               Therapeutic target for gout patients: <6.0         Failed - Cr in normal range and within 360 days    Creatinine, Ser  Date Value Ref Range Status  02/20/2024 1.34 (H) 0.76 - 1.27 mg/dL Final         Passed - AST in normal range and within 360 days    AST  Date Value Ref Range Status  02/20/2024 25 0 - 40 IU/L Final   AST (SGOT) Piccolo, Waived  Date Value Ref Range Status  01/10/2019 25 11 - 38 U/L Final         Passed - ALT in normal range and within 360 days    ALT  Date Value Ref Range Status  02/20/2024 38 0 - 44 IU/L Final   ALT (SGPT) Piccolo, Waived  Date Value Ref Range Status  01/10/2019 35 10 - 47 U/L Final         Passed - Valid encounter within last 12 months    Recent Outpatient Visits           1 month ago Type 2 diabetes mellitus with cardiac complication (HCC)   [place] [facility] [place], [name] T, NP   4 months ago Type 2 diabetes mellitus with cardiac complication (HCC)   [place] [facility] [place], [name] T, NP   8 months ago Type 2 diabetes mellitus with cardiac complication [facility])   [place] [facility] Valerio Melanie DASEN, NP       Future Appointments             In 2 days Hester Alm BROCKS, MD [facility]

## 2024-04-16 ENCOUNTER — Ambulatory Visit: Admitting: Dermatology

## 2024-04-16 ENCOUNTER — Ambulatory Visit: Attending: Cardiology | Admitting: Cardiology

## 2024-04-16 ENCOUNTER — Encounter: Payer: Self-pay | Admitting: Cardiology

## 2024-04-16 VITALS — BP 138/72 | HR 68 | Ht 72.5 in | Wt 196.8 lb

## 2024-04-16 DIAGNOSIS — I152 Hypertension secondary to endocrine disorders: Secondary | ICD-10-CM | POA: Diagnosis not present

## 2024-04-16 DIAGNOSIS — L570 Actinic keratosis: Secondary | ICD-10-CM | POA: Diagnosis not present

## 2024-04-16 DIAGNOSIS — E1159 Type 2 diabetes mellitus with other circulatory complications: Secondary | ICD-10-CM

## 2024-04-16 DIAGNOSIS — D1801 Hemangioma of skin and subcutaneous tissue: Secondary | ICD-10-CM | POA: Diagnosis not present

## 2024-04-16 DIAGNOSIS — L820 Inflamed seborrheic keratosis: Secondary | ICD-10-CM | POA: Diagnosis not present

## 2024-04-16 DIAGNOSIS — L578 Other skin changes due to chronic exposure to nonionizing radiation: Secondary | ICD-10-CM

## 2024-04-16 DIAGNOSIS — L814 Other melanin hyperpigmentation: Secondary | ICD-10-CM | POA: Diagnosis not present

## 2024-04-16 DIAGNOSIS — L821 Other seborrheic keratosis: Secondary | ICD-10-CM

## 2024-04-16 DIAGNOSIS — Z95810 Presence of automatic (implantable) cardiac defibrillator: Secondary | ICD-10-CM | POA: Diagnosis not present

## 2024-04-16 DIAGNOSIS — I5022 Chronic systolic (congestive) heart failure: Secondary | ICD-10-CM | POA: Diagnosis not present

## 2024-04-16 DIAGNOSIS — L209 Atopic dermatitis, unspecified: Secondary | ICD-10-CM | POA: Diagnosis not present

## 2024-04-16 DIAGNOSIS — Z7189 Other specified counseling: Secondary | ICD-10-CM

## 2024-04-16 DIAGNOSIS — W908XXA Exposure to other nonionizing radiation, initial encounter: Secondary | ICD-10-CM | POA: Diagnosis not present

## 2024-04-16 DIAGNOSIS — I428 Other cardiomyopathies: Secondary | ICD-10-CM

## 2024-04-16 DIAGNOSIS — L2089 Other atopic dermatitis: Secondary | ICD-10-CM

## 2024-04-16 DIAGNOSIS — I509 Heart failure, unspecified: Secondary | ICD-10-CM

## 2024-04-16 DIAGNOSIS — Z79899 Other long term (current) drug therapy: Secondary | ICD-10-CM

## 2024-04-16 DIAGNOSIS — Z85828 Personal history of other malignant neoplasm of skin: Secondary | ICD-10-CM

## 2024-04-16 DIAGNOSIS — D229 Melanocytic nevi, unspecified: Secondary | ICD-10-CM

## 2024-04-16 DIAGNOSIS — Z1283 Encounter for screening for malignant neoplasm of skin: Secondary | ICD-10-CM

## 2024-04-16 LAB — CUP PACEART INCLINIC DEVICE CHECK
Date Time Interrogation Session: 20251208124532
Implantable Lead Connection Status: 753985
Implantable Lead Connection Status: 753985
Implantable Lead Connection Status: 753985
Implantable Lead Implant Date: 20060505
Implantable Lead Implant Date: 20120111
Implantable Lead Implant Date: 20180314
Implantable Lead Location: 753858
Implantable Lead Location: 753859
Implantable Lead Location: 753860
Implantable Lead Model: 5076
Implantable Lead Model: 6947
Implantable Pulse Generator Implant Date: 20180314

## 2024-04-16 MED ORDER — MOMETASONE FUROATE 0.1 % EX CREA: TOPICAL_CREAM | CUTANEOUS | 6 refills | Status: AC

## 2024-04-16 NOTE — Progress Notes (Unsigned)
 Follow-Up Visit   Subjective  Ryan [name] is a 73 y.o. male who presents for the following: Skin Cancer Screening and Full Body Skin Exam Hx of bcc at chest patient reports  Hx of isks   Hx of rash on sides has been using hc 2.5 cream   The patient presents for Total-Body Skin Exam (TBSE) for skin cancer screening and mole check. The patient has spots, moles and lesions to be evaluated, some may be new or changing and the patient may have concern these could be cancer.  The following portions of the chart were reviewed this encounter and updated as appropriate: medications, allergies, medical history  Review of Systems:  No other skin or systemic complaints except as noted in HPI or Assessment and Plan.  Objective  Well appearing patient in no apparent distress; mood and affect are within normal limits.  A full examination was performed including scalp, head, eyes, ears, nose, lips, neck, chest, axillae, abdomen, back, buttocks, bilateral upper extremities, bilateral lower extremities, hands, feet, fingers, toes, fingernails, and toenails. All findings within normal limits unless otherwise noted below.   Relevant physical exam findings are noted in the Assessment and Plan.  right ear x 1, face x 1 (2) Erythematous thin papules/macules with gritty scale.  back and trunk x 20 (20) Erythematous stuck-on, waxy papule or plaque  Assessment & Plan   HISTORY OF SKIN CANCER Patient reports BCC at  Chest x 2, treated years ago by Dr Jakie - Clear. Observe for recurrence.  - Call clinic for new or changing lesions.   - Recommend regular skin exams, daily broad-spectrum spf 30+ sunscreen use, and photoprotection.    SKIN CANCER SCREENING PERFORMED TODAY.  ACTINIC DAMAGE - Chronic condition, secondary to cumulative UV/sun exposure - diffuse scaly erythematous macules with underlying dyspigmentation - Recommend daily broad spectrum sunscreen SPF 30+ to sun-exposed areas, reapply  every 2 hours as needed.  - Staying in the shade or wearing long sleeves, sun glasses (UVA+UVB protection) and wide brim hats (4-inch brim around the entire circumference of the hat) are also recommended for sun protection.  - Call for new or changing lesions.  LENTIGINES, SEBORRHEIC KERATOSES, HEMANGIOMAS - Benign normal skin lesions - Benign-appearing - Call for any changes  MELANOCYTIC NEVI - Tan-brown and/or pink-flesh-colored symmetric macules and papules - Benign appearing on exam today - Observation - Call clinic for new or changing moles - Recommend daily use of broad spectrum spf 30+ sunscreen to sun-exposed areas.   ATOPIC DERMATITIS Exam: Scaly pink papules coalescing to plaques 3% BSA Chronic and persistent condition with duration or expected duration over one year. Condition is symptomatic/ bothersome to patient. Not currently at goal. Atopic dermatitis (eczema) is a chronic, relapsing, pruritic condition that can significantly affect quality of life. It is often associated with allergic rhinitis and/or asthma and can require treatment with topical medications, phototherapy, or in severe cases biologic injectable medication (Dupixent; Adbry) or Oral JAK inhibitors. Treatment Plan: Start mometasone  0.5% cream apply topically to affected areas of eczema qd/bid prn as needed  Recommend gentle skin care.   ACTINIC KERATOSIS (2) right ear x 1, face x 1 (2) Actinic keratoses are precancerous spots that appear secondary to cumulative UV radiation exposure/sun exposure over time. They are chronic with expected duration over 1 year. A portion of actinic keratoses will progress to squamous cell carcinoma of the skin. It is not possible to reliably predict which spots will progress to skin cancer and so  treatment is recommended to prevent development of skin cancer.  Recommend daily broad spectrum sunscreen SPF 30+ to sun-exposed areas, reapply every 2 hours as needed.  Recommend  staying in the shade or wearing long sleeves, sun glasses (UVA+UVB protection) and wide brim hats (4-inch brim around the entire circumference of the hat). Call for new or changing lesions. Destruction of lesion - right ear x 1, face x 1 (2) Complexity: simple   Destruction method: cryotherapy   Informed consent: discussed and consent obtained   Timeout:  patient name, date of birth, surgical site, and procedure verified Lesion destroyed using liquid nitrogen: Yes   Region frozen until ice ball extended beyond lesion: Yes   Outcome: patient tolerated procedure well with no complications   Post-procedure details: wound care instructions given    INFLAMED SEBORRHEIC KERATOSIS (20) back and trunk x 20 (20) Symptomatic, irritating, patient would like treated. Destruction of lesion - back and trunk x 20 (20) Complexity: simple   Destruction method: cryotherapy   Informed consent: discussed and consent obtained   Timeout:  patient name, date of birth, surgical site, and procedure verified Lesion destroyed using liquid nitrogen: Yes   Region frozen until ice ball extended beyond lesion: Yes   Outcome: patient tolerated procedure well with no complications   Post-procedure details: wound care instructions given    ATOPIC DERMATITIS, UNSPECIFIED TYPE   Related Medications mometasone  (ELOCON ) 0.1 % cream Apply topically to eczema on body qd/bid prn Return in about 1 year (around 04/16/2025) for TBSE.  IEleanor Blush, CMA, am acting as scribe for Alm Rhyme, MD.   Documentation: I have reviewed the above documentation for accuracy and completeness, and I agree with the above.  Alm Rhyme, MD

## 2024-04-16 NOTE — Patient Instructions (Addendum)
 Medication Instructions:  Your physician recommends that you continue on your current medications as directed. Please refer to the Current Medication list given to you today.  *If you need a refill on your cardiac medications before your next appointment, please call your pharmacy*  Lab Work: No labs ordered today    Testing/Procedures: Your physician has requested that you have an echocardiogram. Echocardiography is a painless test that uses sound waves to create images of your heart. It provides your doctor with information about the size and shape of your heart and how well your heart's chambers and valves are working.   You may receive an ultrasound enhancing agent through an IV if needed to better visualize your heart during the echo. This procedure takes approximately one hour.  There are no restrictions for this procedure.  This will take place at [address] [facility] [address], [place] [address]  Please note: We ask at that you not bring children with you during ultrasound (echo/ vascular) testing. Due to room size and safety concerns, children are not allowed in the ultrasound rooms during exams. Our front office staff cannot provide observation of children in our lobby area while testing is being conducted. An adult accompanying a patient to their appointment will only be allowed in the ultrasound room at the discretion of the ultrasound technician under special circumstances. We apologize for any inconvenience.   Follow-Up: At [facility], you and your health needs are our priority.  As part of our continuing mission to provide you with exceptional heart care, our providers are all part of one team.  This team includes your primary Cardiologist (physician) and Advanced Practice Providers or APPs (Physician Assistants and Nurse Practitioners) who all work together to provide you with the care you need, when you need it.  Your next appointment:   6 month(s)  with Suzann Riddle, NP  New Consult with Dr. Argentina  Provider:   Suzann Riddle, NP

## 2024-04-16 NOTE — Patient Instructions (Addendum)
 Actinic keratoses are precancerous spots that appear secondary to cumulative UV radiation exposure/sun exposure over time. They are chronic with expected duration over 1 year. A portion of actinic keratoses will progress to squamous cell carcinoma of the skin. It is not possible to reliably predict which spots will progress to skin cancer and so treatment is recommended to prevent development of skin cancer.  Recommend daily broad spectrum sunscreen SPF 30+ to sun-exposed areas, reapply every 2 hours as needed.  Recommend staying in the shade or wearing long sleeves, sun glasses (UVA+UVB protection) and wide brim hats (4-inch brim around the entire circumference of the hat). Call for new or changing lesions.     Cryotherapy Aftercare  Wash gently with soap and water everyday.   Apply Vaseline and Band-Aid daily until healed.    Seborrheic Keratosis  What causes seborrheic keratoses? Seborrheic keratoses are harmless, common skin growths that first appear during adult life.  As time goes by, more growths appear.  Some people may develop a large number of them.  Seborrheic keratoses appear on both covered and uncovered body parts.  They are not caused by sunlight.  The tendency to develop seborrheic keratoses can be inherited.  They vary in color from skin-colored to gray, brown, or even black.  They can be either smooth or have a rough, warty surface.   Seborrheic keratoses are superficial and look as if they were stuck on the skin.  Under the microscope this type of keratosis looks like layers upon layers of skin.  That is why at times the top layer may seem to fall off, but the rest of the growth remains and re-grows.    Treatment Seborrheic keratoses do not need to be treated, but can easily be removed in the office.  Seborrheic keratoses often cause symptoms when they rub on clothing or jewelry.  Lesions can be in the way of shaving.  If they become inflamed, they can cause itching, soreness,  or burning.  Removal of a seborrheic keratosis can be accomplished by freezing, burning, or surgery. If any spot bleeds, scabs, or grows rapidly, please return to have it checked, as these can be an indication of a skin cancer.   Melanoma ABCDEs  Melanoma is the most dangerous type of skin cancer, and is the leading cause of death from skin disease.  You are more likely to develop melanoma if you: Have light-colored skin, light-colored eyes, or red or blond hair Spend a lot of time in the sun Tan regularly, either outdoors or in a tanning bed Have had blistering sunburns, especially during childhood Have a close family member who has had a melanoma Have atypical moles or large birthmarks  Early detection of melanoma is key since treatment is typically straightforward and cure rates are extremely high if we catch it early.   The first sign of melanoma is often a change in a mole or a new dark spot.  The ABCDE system is a way of remembering the signs of melanoma.  A for asymmetry:  The two halves do not match. B for border:  The edges of the growth are irregular. C for color:  A mixture of colors are present instead of an even brown color. D for diameter:  Melanomas are usually (but not always) greater than 6mm - the size of a pencil eraser. E for evolution:  The spot keeps changing in size, shape, and color.  Please check your skin once per month between visits. You can  use a small mirror in front and a large mirror behind you to keep an eye on the back side or your body.   If you see any new or changing lesions before your next follow-up, please call to schedule a visit.  Please continue daily skin protection including broad spectrum sunscreen SPF 30+ to sun-exposed areas, reapplying every 2 hours as needed when you're outdoors.   Staying in the shade or wearing long sleeves, sun glasses (UVA+UVB protection) and wide brim hats (4-inch brim around the entire circumference of the hat) are  also recommended for sun protection.    Due to recent changes in healthcare laws, you may see results of your pathology and/or laboratory studies on MyChart before the doctors have had a chance to review them. We understand that in some cases there may be results that are confusing or concerning to you. Please understand that not all results are received at the same time and often the doctors may need to interpret multiple results in order to provide you with the best plan of care or course of treatment. Therefore, we ask that you please give us  2 business days to thoroughly review all your results before contacting the office for clarification. Should we see a critical lab result, you will be contacted sooner.   If You Need Anything After Your Visit  If you have any questions or concerns for your doctor, please call our main line at [phone] and press option 4 to reach your doctor's medical assistant. If no one answers, please leave a voicemail as directed and we will return your call as soon as possible. Messages left after 4 pm will be answered the following business day.   You may also send us  a message via MyChart. We typically respond to MyChart messages within 1-2 business days.  For prescription refills, please ask your pharmacy to contact our office. Our fax number is [phone].  If you have an urgent issue when the clinic is closed that cannot wait until the next business day, you can page your doctor at the number below.    Please note that while we do our best to be available for urgent issues outside of office hours, we are not available 24/7.   If you have an urgent issue and are unable to reach us , you may choose to seek medical care at your doctor's office, retail clinic, urgent care center, or emergency room.  If you have a medical emergency, please immediately call 911 or go to the emergency department.  Pager Numbers  - Dr. Hester: [phone]  - Dr. Jackquline:  [phone]  - Dr. Claudene: [phone]   - Dr. Raymund: [phone]  In the event of inclement weather, please call our main line at [phone] for an update on the status of any delays or closures.  Dermatology Medication Tips: Please keep the boxes that topical medications come in in order to help keep track of the instructions about where and how to use these. Pharmacies typically print the medication instructions only on the boxes and not directly on the medication tubes.   If your medication is too expensive, please contact our office at [phone] option 4 or send us  a message through MyChart.   We are unable to tell what your co-pay for medications will be in advance as this is different depending on your insurance coverage. However, we may be able to find a substitute medication at lower cost or fill out paperwork to get insurance to cover  a needed medication.   If a prior authorization is required to get your medication covered by your insurance company, please allow us  1-2 business days to complete this process.  Drug prices often vary depending on where the prescription is filled and some pharmacies may offer cheaper prices.  The website www.goodrx.com contains coupons for medications through different pharmacies. The prices here do not account for what the cost may be with help from insurance (it may be cheaper with your insurance), but the website can give you the price if you did not use any insurance.  - You can print the associated coupon and take it with your prescription to the pharmacy.  - You may also stop by our office during regular business hours and pick up a GoodRx coupon card.  - If you need your prescription sent electronically to a different pharmacy, notify our office through [facility] or by phone at [phone] option 4.     Si Usted Necesita Algo Despus de Su Visita  Tambin puede enviarnos un mensaje a travs de Clinical cytogeneticist. Por lo general  respondemos a los mensajes de MyChart en el transcurso de 1 a 2 das hbiles.  Para renovar recetas, por favor pida a su farmacia que se ponga en contacto con nuestra oficina. Randi lakes de fax es [place] [phone].  Si tiene un asunto urgente cuando la clnica est cerrada y que no puede esperar hasta el siguiente da hbil, puede llamar/localizar a su doctor(a) al nmero que aparece a continuacin.   Por favor, tenga en cuenta que aunque hacemos todo lo posible para estar disponibles para asuntos urgentes fuera del horario de [place], no estamos disponibles las 24 horas del da, los 7 [address]  [address] de la [place].   Si tiene un problema urgente y no puede comunicarse con nosotros, puede optar por buscar atencin mdica  en el consultorio de su doctor(a), en una clnica privada, en un centro de atencin urgente o en una sala de emergencias.  Si tiene Engineer, drilling, por favor llame inmediatamente al 911 o vaya a la sala de emergencias.  Nmeros de bper  - Dr. Hester: [phone]  - Dra. Jackquline: [phone]  - Dr. Claudene: [phone]  - Dra. Kitts: [phone]  En caso de inclemencias del [place], por favor llame a nuestra lnea principal al [phone] para una actualizacin sobre el estado de cualquier retraso o cierre.  Consejos para la medicacin en dermatologa: Por favor, guarde las cajas en las que vienen los medicamentos de uso tpico para ayudarle a seguir las instrucciones sobre dnde y cmo usarlos. Las farmacias generalmente imprimen las instrucciones del medicamento slo en las cajas y no directamente en los tubos del [place].   Si su medicamento es muy caro, por favor, pngase en contacto con landry rieger llamando al [phone] y presione la opcin 4 o envenos un mensaje a travs de Clinical cytogeneticist.   No podemos decirle cul ser su copago por los medicamentos por adelantado ya que esto es diferente dependiendo de la cobertura de su seguro. Sin embargo, es posible  que podamos encontrar un medicamento sustituto a Audiological scientist un formulario para que el seguro cubra el medicamento que se considera necesario.   Si se requiere una autorizacin previa para que su compaa de seguros malta su medicamento, por favor permtanos de 1 a 2 das hbiles para completar este proceso.  Los precios de los medicamentos varan con frecuencia dependiendo del Environmental consultant de dnde se surte la receta y iraq  pueden ofrecer precios ms baratos.  El sitio web www.goodrx.com tiene cupones para medicamentos de Health and safety inspector. Los precios aqu no tienen en cuenta lo que podra costar con la ayuda del seguro (puede ser ms barato con su seguro), pero el sitio web puede darle el precio si no utiliz Tourist information centre manager.  - Puede imprimir el cupn correspondiente y llevarlo con su receta a la farmacia.  - Tambin puede pasar por nuestra oficina durante el horario de atencin regular y Education officer, museum una tarjeta de cupones de GoodRx.  - Si necesita que su receta se enve electrnicamente a una farmacia diferente, informe a nuestra oficina a travs de MyChart de [employer] o por telfono llamando al [phone] y presione la opcin 4.

## 2024-04-17 ENCOUNTER — Encounter: Payer: Self-pay | Admitting: Dermatology

## 2024-04-17 NOTE — Telephone Encounter (Signed)
 Received pt portion pap [employer] (januvia ) waiting on provider portion.

## 2024-04-18 ENCOUNTER — Other Ambulatory Visit: Payer: Self-pay | Admitting: Nurse Practitioner

## 2024-04-18 DIAGNOSIS — I429 Cardiomyopathy, unspecified: Secondary | ICD-10-CM

## 2024-04-20 ENCOUNTER — Other Ambulatory Visit: Payer: Self-pay | Admitting: Nurse Practitioner

## 2024-04-20 ENCOUNTER — Ambulatory Visit: Payer: Self-pay | Admitting: Cardiology

## 2024-04-20 DIAGNOSIS — I429 Cardiomyopathy, unspecified: Secondary | ICD-10-CM

## 2024-04-20 NOTE — Telephone Encounter (Signed)
 Received provider portion [employer] (Januvia ) faxed to company along provider portion proof of income.

## 2024-04-20 NOTE — Telephone Encounter (Signed)
 Requested Prescriptions  Pending Prescriptions Disp Refills   dexlansoprazole  (DEXILANT ) 60 MG capsule [Pharmacy Med Name: DEXLANSOPRAZOLE  DR 60 MG CAP] 90 capsule 0    Sig: Take 1 capsule (60 mg total) by mouth daily.     Gastroenterology: Proton Pump Inhibitors Passed - 04/20/2024  8:38 AM      Passed - Valid encounter within last 12 months    Recent Outpatient Visits           2 months ago Type 2 diabetes mellitus with cardiac complication (HCC)   [facility] [place], [place] T, NP   5 months ago Type 2 diabetes mellitus with cardiac complication (HCC)   [place] [facility] [place], [name] T, NP   8 months ago Type 2 diabetes mellitus with cardiac complication [facility])    [facility] Val[place] Melanie DASEN, NP       Future Appointments             In 1 month Argentina Clap, MD [facility] [place]   In 12 months Hester Alm BROCKS, MD [facility]

## 2024-04-23 NOTE — Telephone Encounter (Signed)
 Already refilled on 04/20/24 in a separate refill encounter. Will refuse this request due to this.

## 2024-04-25 ENCOUNTER — Encounter

## 2024-04-26 ENCOUNTER — Encounter

## 2024-04-29 ENCOUNTER — Ambulatory Visit: Payer: Self-pay | Admitting: Cardiology

## 2024-04-29 ENCOUNTER — Ambulatory Visit

## 2024-04-29 DIAGNOSIS — I509 Heart failure, unspecified: Secondary | ICD-10-CM | POA: Diagnosis not present

## 2024-04-29 LAB — CUP PACEART REMOTE DEVICE CHECK
Battery Remaining Longevity: 4 mo
Battery Voltage: 2.84 V
Brady Statistic AP VP Percent: 4.29 %
Brady Statistic AP VS Percent: 0.01 %
Brady Statistic AS VP Percent: 95.68 %
Brady Statistic AS VS Percent: 0.01 %
Brady Statistic RA Percent Paced: 4.26 %
Brady Statistic RV Percent Paced: 99.96 %
Date Time Interrogation Session: 20251221063427
HighPow Impedance: 48 Ohm
HighPow Impedance: 61 Ohm
Implantable Lead Connection Status: 753985
Implantable Lead Connection Status: 753985
Implantable Lead Connection Status: 753985
Implantable Lead Implant Date: 20060505
Implantable Lead Implant Date: 20120111
Implantable Lead Implant Date: 20180314
Implantable Lead Location: 753858
Implantable Lead Location: 753859
Implantable Lead Location: 753860
Implantable Lead Model: 5076
Implantable Lead Model: 6947
Implantable Pulse Generator Implant Date: 20180314
Lead Channel Impedance Value: 224.438
Lead Channel Impedance Value: 230.327
Lead Channel Impedance Value: 231.42 Ohm
Lead Channel Impedance Value: 237.686
Lead Channel Impedance Value: 265.661
Lead Channel Impedance Value: 399 Ohm
Lead Channel Impedance Value: 399 Ohm
Lead Channel Impedance Value: 418 Ohm
Lead Channel Impedance Value: 475 Ohm
Lead Channel Impedance Value: 513 Ohm
Lead Channel Impedance Value: 551 Ohm
Lead Channel Impedance Value: 551 Ohm
Lead Channel Impedance Value: 703 Ohm
Lead Channel Impedance Value: 836 Ohm
Lead Channel Impedance Value: 836 Ohm
Lead Channel Impedance Value: 836 Ohm
Lead Channel Impedance Value: 836 Ohm
Lead Channel Impedance Value: 988 Ohm
Lead Channel Pacing Threshold Amplitude: 0.625 V
Lead Channel Pacing Threshold Amplitude: 0.75 V
Lead Channel Pacing Threshold Amplitude: 1.25 V
Lead Channel Pacing Threshold Pulse Width: 0.4 ms
Lead Channel Pacing Threshold Pulse Width: 0.4 ms
Lead Channel Pacing Threshold Pulse Width: 0.4 ms
Lead Channel Sensing Intrinsic Amplitude: 1.375 mV
Lead Channel Sensing Intrinsic Amplitude: 1.375 mV
Lead Channel Sensing Intrinsic Amplitude: 19.625 mV
Lead Channel Sensing Intrinsic Amplitude: 19.625 mV
Lead Channel Setting Pacing Amplitude: 1.5 V
Lead Channel Setting Pacing Amplitude: 1.5 V
Lead Channel Setting Pacing Amplitude: 2 V
Lead Channel Setting Pacing Pulse Width: 0.4 ms
Lead Channel Setting Pacing Pulse Width: 0.4 ms
Lead Channel Setting Sensing Sensitivity: 0.3 mV
Zone Setting Status: 755011
Zone Setting Status: 755011

## 2024-04-30 ENCOUNTER — Encounter

## 2024-04-30 NOTE — Progress Notes (Signed)
 Remote ICD Transmission

## 2024-05-11 NOTE — Progress Notes (Signed)
" ° °  05/11/2024  Patient ID: Elspeth KATHEE Core, male   DOB: [birth year], 74 y.o.   [name]: [id number]  Following up on medication access/affordability.  Patient previously enrolled in patient assistance programs for Farxiga , Entresto , and Januvia .    -2026 PAP application was faxed to [employer] 12/12 for patient to continue receiving Januvia  100mg - will coordinate with medication assistance team to check on status  -Unclear if [employer]&Me PAP application for Farxiga  renewal for 2026 was ever submitted- coordinating with medication assistance team to follow-up  -Entresto  is no longer available through PAP since medication now has a generic available.  Based on [facility] for 2024, patient would not qualify for [facility], but I am sending a MyChart message to see if current HHI may differ.  Channing DELENA Mealing, PharmD, DPLA  "

## 2024-05-18 ENCOUNTER — Other Ambulatory Visit: Payer: Self-pay | Admitting: Nurse Practitioner

## 2024-05-18 NOTE — Telephone Encounter (Signed)
 Faxing [employer] pap (Januvia ) company said they have not received.,faxing pt and provider,proof of income,ins card.

## 2024-05-18 NOTE — Telephone Encounter (Signed)
 Filled and faxed  provider portion [employer]ME Farxiga , for provider to sign and date.

## 2024-05-22 ENCOUNTER — Ambulatory Visit: Attending: Cardiology

## 2024-05-22 DIAGNOSIS — I5022 Chronic systolic (congestive) heart failure: Secondary | ICD-10-CM

## 2024-05-22 DIAGNOSIS — I509 Heart failure, unspecified: Secondary | ICD-10-CM

## 2024-05-22 LAB — ECHOCARDIOGRAM COMPLETE
AR max vel: 2.24 cm2
AV Area VTI: 2.08 cm2
AV Area mean vel: 2.19 cm2
AV Mean grad: 2 mmHg
AV Peak grad: 3.9 mmHg
Ao pk vel: 0.99 m/s
Area-P 1/2: 4.06 cm2
S' Lateral: 3.4 cm

## 2024-05-23 ENCOUNTER — Telehealth: Payer: Self-pay

## 2024-05-23 NOTE — Telephone Encounter (Signed)
 Received provide provider portion [employer]ME Farxiga  faxed to along pt portion [employer]ME today.

## 2024-05-23 NOTE — Telephone Encounter (Signed)
 Received refill request from AZ&ME on Farxiga  can be fax to [facility] to [phone]

## 2024-05-23 NOTE — Telephone Encounter (Signed)
 Received approval letter from AZ[employer]ME Farxiga  thru 05/09/2025,approval letter index

## 2024-05-24 ENCOUNTER — Other Ambulatory Visit: Payer: Self-pay

## 2024-05-24 MED ORDER — FARXIGA 10 MG PO TABS: ORAL_TABLET | ORAL | 4 refills | Status: AC

## 2024-05-24 NOTE — Progress Notes (Signed)
" ° °  05/24/2024  Patient ID: Ryan [name], male   DOB: 07-15-1950, 74 y.o.   [name]: [id number]  Medication assistance team received a fax from [employer]Me PAP stating a new prescription for Farxiga  10mg  will need to be sent to [facility] in order for them to process refills for 2026.  Order pending for PCP to sign.  Ryan [name], PharmD, DPLA  "

## 2024-05-28 ENCOUNTER — Encounter

## 2024-05-30 ENCOUNTER — Ambulatory Visit: Attending: Cardiology

## 2024-05-31 ENCOUNTER — Encounter

## 2024-05-31 LAB — CUP PACEART REMOTE DEVICE CHECK
Battery Remaining Longevity: 4 mo
Battery Voltage: 2.84 V
Brady Statistic AP VP Percent: 3.22 %
Brady Statistic AP VS Percent: 0.01 %
Brady Statistic AS VP Percent: 96.72 %
Brady Statistic AS VS Percent: 0.05 %
Brady Statistic RA Percent Paced: 3.21 %
Brady Statistic RV Percent Paced: 99.85 %
Date Time Interrogation Session: 20260121012204
HighPow Impedance: 50 Ohm
HighPow Impedance: 67 Ohm
Implantable Lead Connection Status: 753985
Implantable Lead Connection Status: 753985
Implantable Lead Connection Status: 753985
Implantable Lead Implant Date: 20060505
Implantable Lead Implant Date: 20120111
Implantable Lead Implant Date: 20180314
Implantable Lead Location: 753858
Implantable Lead Location: 753859
Implantable Lead Location: 753860
Implantable Lead Model: 5076
Implantable Lead Model: 6947
Implantable Pulse Generator Implant Date: 20180314
Lead Channel Impedance Value: 205.114
Lead Channel Impedance Value: 222.34 Ohm
Lead Channel Impedance Value: 226.51 Ohm
Lead Channel Impedance Value: 247.704
Lead Channel Impedance Value: 266.667
Lead Channel Impedance Value: 361 Ohm
Lead Channel Impedance Value: 418 Ohm
Lead Channel Impedance Value: 418 Ohm
Lead Channel Impedance Value: 475 Ohm
Lead Channel Impedance Value: 513 Ohm
Lead Channel Impedance Value: 551 Ohm
Lead Channel Impedance Value: 608 Ohm
Lead Channel Impedance Value: 646 Ohm
Lead Channel Impedance Value: 703 Ohm
Lead Channel Impedance Value: 722 Ohm
Lead Channel Impedance Value: 836 Ohm
Lead Channel Impedance Value: 836 Ohm
Lead Channel Impedance Value: 893 Ohm
Lead Channel Pacing Threshold Amplitude: 0.625 V
Lead Channel Pacing Threshold Amplitude: 0.625 V
Lead Channel Pacing Threshold Amplitude: 1.5 V
Lead Channel Pacing Threshold Pulse Width: 0.4 ms
Lead Channel Pacing Threshold Pulse Width: 0.4 ms
Lead Channel Pacing Threshold Pulse Width: 0.4 ms
Lead Channel Sensing Intrinsic Amplitude: 1.375 mV
Lead Channel Sensing Intrinsic Amplitude: 1.375 mV
Lead Channel Sensing Intrinsic Amplitude: 19.625 mV
Lead Channel Sensing Intrinsic Amplitude: 19.625 mV
Lead Channel Setting Pacing Amplitude: 1.5 V
Lead Channel Setting Pacing Amplitude: 1.5 V
Lead Channel Setting Pacing Amplitude: 2 V
Lead Channel Setting Pacing Pulse Width: 0.4 ms
Lead Channel Setting Pacing Pulse Width: 0.4 ms
Lead Channel Setting Sensing Sensitivity: 0.3 mV
Zone Setting Status: 755011
Zone Setting Status: 755011

## 2024-06-04 ENCOUNTER — Telehealth: Payer: Self-pay

## 2024-06-04 ENCOUNTER — Ambulatory Visit: Payer: Self-pay | Admitting: Cardiology

## 2024-06-04 NOTE — Progress Notes (Signed)
" ° °  06/04/2024  Patient ID: Ryan [name], male   DOB: 31-Aug-1950, 74 y.o.   [name]: [id number]  Follow-up on medication access/affordability.  Patient has been approved to continue to receive Farxiga  10mg  daily through AZ&Me PAP, and a new prescription was sent to [facility] 1/15.  Sending patient MyChart message to see if this medication has been shipped to him yet this year.  He no longer qualified for Entresto  PAP or [facility], but it appears generic was filled earlier this month- will verify this is affordable for him.  No updates mentioned in chart in regard to Januvia  100mg  PAP status for 2026, so I am following up with medication assistance team to check on that.  Channing DELENA Mealing, PharmD, DPLA  "

## 2024-06-11 ENCOUNTER — Ambulatory Visit

## 2024-06-11 VITALS — BP 120/78 | HR 75 | Ht 73.5 in | Wt 196.0 lb

## 2024-06-11 DIAGNOSIS — I480 Paroxysmal atrial fibrillation: Secondary | ICD-10-CM

## 2024-06-11 DIAGNOSIS — E782 Mixed hyperlipidemia: Secondary | ICD-10-CM | POA: Diagnosis not present

## 2024-06-11 DIAGNOSIS — I428 Other cardiomyopathies: Secondary | ICD-10-CM | POA: Diagnosis not present

## 2024-06-11 DIAGNOSIS — Z95810 Presence of automatic (implantable) cardiac defibrillator: Secondary | ICD-10-CM | POA: Diagnosis not present

## 2024-06-11 NOTE — Patient Instructions (Signed)
 Medication Instructions:  Your physician recommends that you continue on your current medications as directed. Please refer to the Current Medication list given to you today.  *If you need a refill on your cardiac medications before your next appointment, please call your pharmacy*  Lab Work: No labs ordered today  If you have labs (blood work) drawn today and your tests are completely normal, you will receive your results only by: MyChart Message (if you have MyChart) OR A paper copy in the mail If you have any lab test that is abnormal or we need to change your treatment, we will call you to review the results.  Testing/Procedures: No test ordered today   Follow-Up: At [facility], you and your health needs are our priority.  As part of our continuing mission to provide you with exceptional heart care, our providers are all part of one team.  This team includes your primary Cardiologist (physician) and Advanced Practice Providers or APPs (Physician Assistants and Nurse Practitioners) who all work together to provide you with the care you need, when you need it.  Your next appointment:   12 month(s)  Provider:   Caron Poser, MD    We recommend signing up for the patient portal called MyChart.  Sign up information is provided on this After Visit Summary.  MyChart is used to connect with patients for Virtual Visits (Telemedicine).  Patients are able to view lab/test results, encounter notes, upcoming appointments, etc.  Non-urgent messages can be sent to your provider as well.   To learn more about what you can do with MyChart, go to [email or web address].

## 2024-06-13 ENCOUNTER — Other Ambulatory Visit: Payer: Self-pay | Admitting: Nurse Practitioner

## 2024-06-14 NOTE — Telephone Encounter (Signed)
 Requested Prescriptions  Pending Prescriptions Disp Refills   JANUVIA  100 MG tablet [Pharmacy Med Name: JANUVIA  100 MG TABLET] 90 tablet 0    Sig: Take 1 tablet (100 mg total) by mouth daily.     Endocrinology:  Diabetes - DPP-4 Inhibitors Failed - 06/14/2024  2:17 PM      Failed - Cr in normal range and within 360 days    Creatinine, Ser  Date Value Ref Range Status  02/20/2024 1.34 (H) 0.76 - 1.27 mg/dL Final         Passed - HBA1C is between 0 and 7.9 and within 180 days    Hemoglobin A1C  Date Value Ref Range Status  11/08/2017 6.9  Final  11/08/2017 6.9  Final   HB A1C (BAYER DCA - WAIVED)  Date Value Ref Range Status  02/20/2024 6.9 (H) 4.8 - 5.6 % Final    Comment:             Prediabetes: 5.7 - 6.4          Diabetes: >6.4          Glycemic control for adults with diabetes: <7.0          Passed - Valid encounter within last 6 months    Recent Outpatient Visits           3 months ago Type 2 diabetes mellitus with cardiac complication (HCC)   [facility] [place], [place] T, NP   6 months ago Type 2 diabetes mellitus with cardiac complication (HCC)   [place] [facility] [place], [place] T, NP   10 months ago Type 2 diabetes mellitus with cardiac complication [facility])   [place] [facility] Valerio Melanie DASEN, NP       Future Appointments             In 10 months Hester Alm BROCKS, MD [facility]

## 2024-06-14 NOTE — Telephone Encounter (Signed)
 Gave [employer] a call to follow up on pt application,per representative pt is DENIED at this time due to pt income is over the company limits for household income of 2 company income should be $86.560 what pt income provide for a household of 2  was $129.00.pt is denied at this time.

## 2024-06-28 ENCOUNTER — Encounter

## 2024-06-30 ENCOUNTER — Ambulatory Visit

## 2024-07-02 ENCOUNTER — Encounter

## 2024-07-25 ENCOUNTER — Encounter

## 2024-07-30 ENCOUNTER — Encounter

## 2024-07-31 ENCOUNTER — Ambulatory Visit

## 2024-08-02 ENCOUNTER — Encounter

## 2024-08-27 ENCOUNTER — Encounter

## 2024-08-30 ENCOUNTER — Encounter: Admitting: Nurse Practitioner

## 2024-08-30 ENCOUNTER — Encounter

## 2024-09-03 ENCOUNTER — Encounter

## 2024-10-04 ENCOUNTER — Encounter

## 2024-10-24 ENCOUNTER — Encounter

## 2024-11-26 ENCOUNTER — Encounter

## 2025-01-23 ENCOUNTER — Encounter

## 2025-03-05 ENCOUNTER — Ambulatory Visit

## 2025-04-16 ENCOUNTER — Ambulatory Visit: Admitting: Dermatology
